# Patient Record
Sex: Female | Born: 1940 | Hispanic: Yes | Marital: Married | Smoking: Former smoker
Health system: Southern US, Community
[De-identification: ages and names within clinical notes are randomized; demographics above are authoritative.]

## PROBLEM LIST (undated history)

## (undated) DIAGNOSIS — K5909 Other constipation: Secondary | ICD-10-CM

## (undated) DIAGNOSIS — F32A Depression, unspecified: Secondary | ICD-10-CM

## (undated) DIAGNOSIS — E039 Hypothyroidism, unspecified: Secondary | ICD-10-CM

## (undated) DIAGNOSIS — N368 Other specified disorders of urethra: Secondary | ICD-10-CM

## (undated) DIAGNOSIS — M1990 Unspecified osteoarthritis, unspecified site: Secondary | ICD-10-CM

## (undated) DIAGNOSIS — E785 Hyperlipidemia, unspecified: Secondary | ICD-10-CM

## (undated) DIAGNOSIS — E079 Disorder of thyroid, unspecified: Secondary | ICD-10-CM

## (undated) DIAGNOSIS — F329 Major depressive disorder, single episode, unspecified: Secondary | ICD-10-CM

## (undated) DIAGNOSIS — M810 Age-related osteoporosis without current pathological fracture: Secondary | ICD-10-CM

## (undated) DIAGNOSIS — F419 Anxiety disorder, unspecified: Secondary | ICD-10-CM

## (undated) DIAGNOSIS — J189 Pneumonia, unspecified organism: Secondary | ICD-10-CM

## (undated) DIAGNOSIS — M316 Other giant cell arteritis: Secondary | ICD-10-CM

## (undated) HISTORY — PX: CARPAL TUNNEL RELEASE: SHX101

## (undated) HISTORY — PX: ABDOMINAL HYSTERECTOMY: SHX81

## (undated) HISTORY — DX: Anxiety disorder, unspecified: F41.9

## (undated) HISTORY — DX: Age-related osteoporosis without current pathological fracture: M81.0

## (undated) HISTORY — DX: Unspecified osteoarthritis, unspecified site: M19.90

## (undated) HISTORY — DX: Other specified disorders of urethra: N36.8

## (undated) HISTORY — DX: Other giant cell arteritis: M31.6

## (undated) HISTORY — DX: Hyperlipidemia, unspecified: E78.5

## (undated) HISTORY — DX: Depression, unspecified: F32.A

## (undated) HISTORY — PX: TONSILLECTOMY: SUR1361

## (undated) HISTORY — PX: BREAST SURGERY: SHX581

## (undated) HISTORY — DX: Disorder of thyroid, unspecified: E07.9

## (undated) HISTORY — DX: Major depressive disorder, single episode, unspecified: F32.9

## (undated) HISTORY — PX: BREAST EXCISIONAL BIOPSY: SUR124

## (undated) HISTORY — PX: COLONOSCOPY: SHX174

## (undated) HISTORY — PX: JOINT REPLACEMENT: SHX530

---

## 2012-10-09 HISTORY — PX: TEMPORAL ARTERY BIOPSY / LIGATION: SUR132

## 2016-07-21 DIAGNOSIS — M25512 Pain in left shoulder: Secondary | ICD-10-CM | POA: Diagnosis not present

## 2016-07-21 DIAGNOSIS — M25511 Pain in right shoulder: Secondary | ICD-10-CM | POA: Diagnosis not present

## 2016-07-21 DIAGNOSIS — Z7952 Long term (current) use of systemic steroids: Secondary | ICD-10-CM | POA: Diagnosis not present

## 2016-07-21 DIAGNOSIS — R5383 Other fatigue: Secondary | ICD-10-CM | POA: Diagnosis not present

## 2016-07-21 DIAGNOSIS — G8929 Other chronic pain: Secondary | ICD-10-CM | POA: Diagnosis not present

## 2016-07-21 DIAGNOSIS — M316 Other giant cell arteritis: Secondary | ICD-10-CM | POA: Diagnosis not present

## 2016-07-26 DIAGNOSIS — M67912 Unspecified disorder of synovium and tendon, left shoulder: Secondary | ICD-10-CM | POA: Diagnosis not present

## 2016-07-27 ENCOUNTER — Encounter: Payer: Self-pay | Admitting: Family Medicine

## 2016-07-27 ENCOUNTER — Ambulatory Visit (INDEPENDENT_AMBULATORY_CARE_PROVIDER_SITE_OTHER): Payer: Medicare Other | Admitting: Family Medicine

## 2016-07-27 VITALS — BP 110/68 | HR 80 | Temp 98.3°F | Resp 16 | Ht 62.25 in | Wt 136.1 lb

## 2016-07-27 DIAGNOSIS — E785 Hyperlipidemia, unspecified: Secondary | ICD-10-CM | POA: Insufficient documentation

## 2016-07-27 DIAGNOSIS — F331 Major depressive disorder, recurrent, moderate: Secondary | ICD-10-CM | POA: Diagnosis not present

## 2016-07-27 DIAGNOSIS — R0600 Dyspnea, unspecified: Secondary | ICD-10-CM | POA: Diagnosis not present

## 2016-07-27 DIAGNOSIS — E039 Hypothyroidism, unspecified: Secondary | ICD-10-CM | POA: Diagnosis not present

## 2016-07-27 DIAGNOSIS — G4700 Insomnia, unspecified: Secondary | ICD-10-CM

## 2016-07-27 LAB — TSH: TSH: 0.43 u[IU]/mL (ref 0.35–4.50)

## 2016-07-27 LAB — BRAIN NATRIURETIC PEPTIDE: Pro B Natriuretic peptide (BNP): 39 pg/mL (ref 0.0–100.0)

## 2016-07-27 MED ORDER — ATORVASTATIN CALCIUM 10 MG PO TABS: 10.0000 mg | ORAL_TABLET | Freq: Every day | ORAL | 2 refills | Status: DC

## 2016-07-27 MED ORDER — TEMAZEPAM 15 MG PO CAPS: 15.0000 mg | ORAL_CAPSULE | Freq: Every evening | ORAL | 1 refills | Status: DC | PRN

## 2016-07-27 MED ORDER — SERTRALINE HCL 100 MG PO TABS: 200.0000 mg | ORAL_TABLET | Freq: Every day | ORAL | 2 refills | Status: DC

## 2016-07-27 MED ORDER — LEVOTHYROXINE SODIUM 112 MCG PO TABS: 112.0000 ug | ORAL_TABLET | Freq: Every day | ORAL | 1 refills | Status: DC

## 2016-07-27 NOTE — Progress Notes (Signed)
Pre visit review using our clinic review tool, if applicable. No additional management support is needed unless otherwise documented below in the visit note. 

## 2016-07-27 NOTE — Patient Instructions (Addendum)
A few things to remember from today's visit:   Dyspnea, unspecified type - Plan: B Nat Peptide, DG Chest 2 View, EKG 12-Lead  Hypothyroidism, unspecified type - Plan: TSH  Major depressive disorder, recurrent episode, moderate (HCC)  Hyperlipidemia, unspecified hyperlipidemia type  A few tips:  -As we age balance is not as good as it was, so there is a higher risks for falls. Please remove small rugs and furniture that is "in your way" and could increase the risk of falls. Stretching exercises may help with fall prevention: Yoga and Tai Chi are some examples. Low impact exercise is better, so you are not very achy the next day.  Caution with dehydration, if working outdoors be sure to drink enough fluids.  - Some medications are not safe as we age, increases the risk of side effects and can potentially interact with other medication you are also taken;  including some of over the counter medications. Be sure to let me know when you start a new medication even if it is a dietary/vitamin supplement.   -Healthy diet low in red meet/animal fat and sugar + regular physical activity is recommended.      Please be sure medication list is accurate. If a new problem present, please set up appointment sooner than planned today.

## 2016-07-27 NOTE — Progress Notes (Signed)
HPI:   [name] is a 75 y.o. female, who is here today with her husband to establish care with me.  Former PCP in [place] Last preventive routine visit: 2017  Concerns today: SOB, attributed to Prednisone.   She has Hx of temporal arteritis and currently she is on Prednisone 50 mg daily, resumed while she was still in [place] and continued by rheumatologists she has seen here in this area a few weeks ago. She has been on high doses of Prednisone intermittently for years, started again in 05/2015 because severe temporal headache attributed to temporal arteritis. According to pt, headache becomes worse every time she tries to decrease dose of Prednisone. She had labs recently at her rheumatologists' office.  She denies chest pain, cough,or wheezing. No diaphoresis or dizziness (Hx of vertigo). Dyspnea is at rest and noted initially when she resumed Prednisone in 05/2016, she was on 60 mg at that time. She denies edema,orthopnea, or PND.  Hx of OA, mainly shoulders and hands, also back and knees. Denies Hx of PMR. She is following with orthopedists and recently she received a subacromial steroid injection. Pain is exacerbated by movement, improved some after injection.   Osteoporosis, she is on Fosamax, which she has taken intermittently and resumed about 1 year ago, last DEXA showed osteopenia.  Depression and anxiety, she is on Sertraline 200 mg daily, which has helped. She denies Hx of bipolar disorder or suicidal thoughts. + Insomnia, she is on Temazepam 15 mg, which she takes 2-3 times per week. She has had Xanax in the past, she had some tabs left and occasionally she takes 1/2 during the day.   Hypothyroidism: Currently she is on Levothyroxine 112 mcg daily. Denies cold/heat intolerance, changes in bowel habits, or abnormal wt loss.  Also Hx of HLD. She is on Lipitor 10 mg daily. Denies Hx of CVD. She thinks FLP was done 05/2016.  She follows a  healthful diet and currently she is not exercising regularly.    Review of Systems  Constitutional: Positive for fatigue. Negative for appetite change, fever and unexpected weight change.  HENT: Negative for mouth sores, nosebleeds and trouble swallowing.   Eyes: Negative for pain and visual disturbance.  Respiratory: Positive for shortness of breath. Negative for cough and wheezing.   Cardiovascular: Negative for chest pain, palpitations and leg swelling.  Gastrointestinal: Negative for abdominal pain, nausea and vomiting.       Negative for changes in bowel habits.  Genitourinary: Negative for decreased urine volume, difficulty urinating, dysuria and hematuria.  Musculoskeletal: Positive for arthralgias and back pain. Negative for gait problem.  Skin: Negative for color change and rash.  Neurological: Positive for headaches. Negative for seizures, syncope, weakness and numbness.  Psychiatric/Behavioral: Positive for sleep disturbance. Negative for confusion. The patient is nervous/anxious.       No current outpatient prescriptions on file prior to visit.   No current facility-administered medications on file prior to visit.      Past Medical History:  Diagnosis Date  . Anxiety   . Arthritis   . Depression   . Hyperlipidemia   . Osteoporosis   . Temporal arteritis ([place])   . Thyroid disease    Allergies  Allergen Reactions  . Tdap [Diphth-Acell Pertussis-Tetanus]     Family History  Problem Relation Age of Onset  . Heart disease Mother   . Kidney disease Mother   . Heart disease Father   . Kidney disease  Father     Social History   Social History  . Marital status: Married    Spouse name: N/A  . Number of children: N/A  . Years of education: N/A   Social History Main Topics  . Smoking status: Never Smoker  . Smokeless tobacco: Never Used  . Alcohol use No  . Drug use: No  . Sexual activity: Not Asked   Other Topics Concern  . None   Social History  Narrative  . None    Vitals:   07/27/16 1301  BP: 110/68  Pulse: 80  Resp: 16  Temp: 98.3 F (36.8 C)   O2 sat 98% at RA.  Body mass index is 24.7 kg/m.     Physical Exam  Nursing note and vitals reviewed. Constitutional: She is oriented to person, place, and time. She appears well-developed and well-nourished. She does not appear ill. No distress.  HENT:  Head: Atraumatic.  Mouth/Throat: Oropharynx is clear and moist and mucous membranes are normal.  Eyes: Conjunctivae and EOM are normal. Pupils are equal, round, and reactive to light.  Neck: No JVD present. No tracheal deviation present. No thyroid mass and no thyromegaly present.  Cardiovascular: Normal rate and regular rhythm.   No murmur heard. Pulses:      Dorsalis pedis pulses are 2+ on the right side, and 2+ on the left side.  Respiratory: Effort normal and breath sounds normal. No respiratory distress.  GI: Soft. She exhibits no mass. There is no hepatomegaly. There is no tenderness.  Musculoskeletal: She exhibits no edema.  Shoulder R>L with pain upon ROM, mild limitation of abduction. No signs of synovitis or major deformity appreciated.   Neurological: She is alert and oriented to person, place, and time. She has normal strength. She displays tremor (fine hand tremor, mild, with intention). No cranial nerve deficit. Coordination and gait normal.  Skin: Skin is warm. No rash noted. No erythema.  Psychiatric: Her speech is normal. Her mood appears anxious. Cognition and memory are normal.  Well groomed, good eye contact.      ASSESSMENT AND PLAN:     Jennifer Hull was seen today for establish care.  Diagnoses and all orders for this visit:  Lab Results  Component Value Date   TSH 0.43 07/27/2016     Dyspnea, unspecified type  We discussed possible causes. EKG today: SR, short PR interval,normal axis, ? IVCD, unspecific T wave abnormalities anterior leads, no other EKG available for comparison.  She  had labs done recently (CMP and CBC among some). We discussed some side effects of Prednisone, in this case benefit is greater than risk. Examination today does not suggest infectious process, still CXR recommended.  Instructed about warning signs.  F/U in 3-4 weeks.  -     B Nat Peptide; Future -     DG Chest 2 View; Future -     EKG 12-Lead -     B Nat Peptide  Hypothyroidism, unspecified type  No changes in current management, will follow labs done today and will give further recommendations accordingly.  -     levothyroxine (SYNTHROID, LEVOTHROID) 112 MCG tablet; Take 1 tablet (112 mcg total) by mouth daily before breakfast. -     TSH  Major depressive disorder, recurrent episode, moderate (HCC)  Stable. She is on a high dose Sertraline, no changes in current management for now.  -     sertraline (ZOLOFT) 100 MG tablet; Take 2 tablets (200 mg total) by mouth daily.  Hyperlipidemia, unspecified hyperlipidemia type  No changes in current management. Continue low fat diet. F/U 05/2017. Will try to obtain labs done by former PCP.  -     atorvastatin (LIPITOR) 10 MG tablet; Take 1 tablet (10 mg total) by mouth daily.   Insomnia, unspecified type  Some side effects of medication discussed. Good sleep hygiene discussed. No changes in current management. Fall preventions discussed.   -     temazepam (RESTORIL) 15 MG capsule; Take 1 capsule (15 mg total) by mouth at bedtime as needed for sleep.           Laretta Pyatt G. Martinique, MD  [facility]. [place] office.

## 2016-07-30 ENCOUNTER — Encounter: Payer: Self-pay | Admitting: Family Medicine

## 2016-07-31 ENCOUNTER — Ambulatory Visit (INDEPENDENT_AMBULATORY_CARE_PROVIDER_SITE_OTHER)
Admission: RE | Admit: 2016-07-31 | Discharge: 2016-07-31 | Disposition: A | Discharge status: Home / Self Care | Payer: Medicare Other | Source: Ambulatory Visit | Attending: Family Medicine | Admitting: Family Medicine

## 2016-07-31 DIAGNOSIS — R0602 Shortness of breath: Secondary | ICD-10-CM | POA: Diagnosis not present

## 2016-07-31 DIAGNOSIS — R0600 Dyspnea, unspecified: Secondary | ICD-10-CM | POA: Diagnosis not present

## 2016-08-10 DIAGNOSIS — M316 Other giant cell arteritis: Secondary | ICD-10-CM | POA: Diagnosis not present

## 2016-08-10 DIAGNOSIS — R5383 Other fatigue: Secondary | ICD-10-CM | POA: Diagnosis not present

## 2016-08-10 DIAGNOSIS — Z7952 Long term (current) use of systemic steroids: Secondary | ICD-10-CM | POA: Diagnosis not present

## 2016-08-10 DIAGNOSIS — G8929 Other chronic pain: Secondary | ICD-10-CM | POA: Diagnosis not present

## 2016-08-10 DIAGNOSIS — M25512 Pain in left shoulder: Secondary | ICD-10-CM | POA: Diagnosis not present

## 2016-08-10 DIAGNOSIS — M25511 Pain in right shoulder: Secondary | ICD-10-CM | POA: Diagnosis not present

## 2016-08-10 DIAGNOSIS — R768 Other specified abnormal immunological findings in serum: Secondary | ICD-10-CM | POA: Diagnosis not present

## 2016-08-11 DIAGNOSIS — M67912 Unspecified disorder of synovium and tendon, left shoulder: Secondary | ICD-10-CM | POA: Diagnosis not present

## 2016-08-15 DIAGNOSIS — M25512 Pain in left shoulder: Secondary | ICD-10-CM | POA: Diagnosis not present

## 2016-08-22 DIAGNOSIS — M25512 Pain in left shoulder: Secondary | ICD-10-CM | POA: Diagnosis not present

## 2016-08-24 DIAGNOSIS — M25512 Pain in left shoulder: Secondary | ICD-10-CM | POA: Diagnosis not present

## 2016-08-25 DIAGNOSIS — M75112 Incomplete rotator cuff tear or rupture of left shoulder, not specified as traumatic: Secondary | ICD-10-CM | POA: Diagnosis not present

## 2016-08-28 ENCOUNTER — Ambulatory Visit (INDEPENDENT_AMBULATORY_CARE_PROVIDER_SITE_OTHER): Payer: Medicare Other | Admitting: Family Medicine

## 2016-08-28 ENCOUNTER — Encounter: Payer: Self-pay | Admitting: Family Medicine

## 2016-08-28 VITALS — BP 114/62 | HR 80 | Resp 12 | Ht 62.25 in | Wt 139.1 lb

## 2016-08-28 DIAGNOSIS — E039 Hypothyroidism, unspecified: Secondary | ICD-10-CM | POA: Diagnosis not present

## 2016-08-28 DIAGNOSIS — R69 Illness, unspecified: Secondary | ICD-10-CM | POA: Diagnosis not present

## 2016-08-28 DIAGNOSIS — R739 Hyperglycemia, unspecified: Secondary | ICD-10-CM | POA: Diagnosis not present

## 2016-08-28 DIAGNOSIS — F411 Generalized anxiety disorder: Secondary | ICD-10-CM

## 2016-08-28 LAB — POCT GLYCOSYLATED HEMOGLOBIN (HGB A1C): Hemoglobin A1C: 5.8

## 2016-08-28 NOTE — Patient Instructions (Addendum)
A few things to remember from today's visit:   Hyperglycemia - Plan: POCT HgB A1C  Hypothyroidism, unspecified type - Plan: TSH  Generalized anxiety disorder  No changes today. Continue following with rheumatologists.  In 2 weeks TSH.  A1C today 5.8.   Please be sure medication list is accurate. If a new problem present, please set up appointment sooner than planned today.

## 2016-08-28 NOTE — Progress Notes (Signed)
HPI:   [name] Hull is a 75 y.o. female, who is here today to follow on dyspnea and some other health problems addressed last OV.   I saw her last time on 07/27/16, she was c/o dyspnea, which she was attributing to Prednisone. Hx of temporal arteritis, since her last OV she has seen rheumatologists and Prednisone was decreased from 50 mg to 30 mg and planning on decreasing to 20 mg this weekend. Headache now mild and not as frequent as she was having it, greatly improved.  Recently Dx with RA, she has discussed treatment options and planning on starting Methotrexate.  -She also follows with ortho, left shoulder pain is now mild after shoulder injection.  Recent left shoulder MRI (08/22/16) showed severe tendinosis of the supraspinatus tendon with a high-grade partial-thickness articular  surface tear. Moderate tendinosis of the infraspinatus tendon with a small partial-thickness articular sore face tear. Severe osteoarthritis of the right glenohumeral joint.  -Episodes of chest tightness and shaking sensation, intermittent, has had 2 episodes since her last OV, usually at rest, alleviated by Xanax. She has not identified exacerbating factors, she believes it is also related to Prednisone.  Blurry vision, she  Would like A1C checked, according to pt, former PCP was checking it q 3-4 months because Prednisone treatment. She denies conjunctival erythema or eye pain.  All these symptoms have improved with decreasing Prednisone dose.    Depression and anxiety: Zoloft 150 mg daily. Denies insomnia or suicidal thoughts. Takes Temazepam 15 mg, seems like she needs it less frequent since Prednisone dose was decreased, 2 times per week. Last OV Xanax was discontinued. Blurry vision, dizziness all attributed to Prednisone.  Hypothyroidism: Last OV TSH was on lower normal range. Dose of Levothyroxine was decreased to 112 x 6 days and 1/2 tab x 1 day.  Lab Results  Component Value  Date   TSH 0.43 07/27/2016    No new concerns today.     Review of Systems  Constitutional: Negative for appetite change, diaphoresis, fatigue, fever and unexpected weight change.  HENT: Negative for mouth sores, nosebleeds and trouble swallowing.   Eyes: Positive for visual disturbance (blurry vision). Negative for photophobia, pain and redness.  Respiratory: Positive for chest tightness and shortness of breath. Negative for cough and wheezing.   Cardiovascular: Negative for chest pain, palpitations and leg swelling.  Gastrointestinal: Negative for abdominal pain, nausea and vomiting.       Negative for changes in bowel habits.  Endocrine: Negative for polydipsia, polyphagia and polyuria.  Genitourinary: Negative for decreased urine volume, difficulty urinating, dysuria and hematuria.  Musculoskeletal: Negative for gait problem and neck pain.  Skin: Negative for rash.  Neurological: Negative for syncope, weakness and numbness.  Psychiatric/Behavioral: Negative for confusion, sleep disturbance and suicidal ideas. The patient is nervous/anxious.       Current Outpatient Prescriptions on File Prior to Visit  Medication Sig Dispense Refill  . Acetaminophen (TYLENOL ARTHRITIS PAIN PO) Take 2 tablets by mouth 3 (three) times daily.    Marland Kitchen alendronate (FOSAMAX) 70 MG tablet Take 70 mg by mouth once a week.     Marland Kitchen atorvastatin (LIPITOR) 10 MG tablet Take 1 tablet (10 mg total) by mouth daily. 90 tablet 2  . Cholecalciferol (VITAMIN D3) 2000 units TABS Take 1 tablet by mouth daily.    Marland Kitchen levothyroxine (SYNTHROID, LEVOTHROID) 112 MCG tablet Take 1 tablet (112 mcg total) by mouth daily before breakfast. (Patient taking differently: Take 112 mcg by  mouth daily before breakfast. Take 1/2 tablet on Sundays.) 90 tablet 1  . predniSONE (DELTASONE) 50 MG tablet Take 30 mg by mouth daily with breakfast.     . temazepam (RESTORIL) 15 MG capsule Take 1 capsule (15 mg total) by mouth at bedtime as needed  for sleep. 30 capsule 1   No current facility-administered medications on file prior to visit.      Past Medical History:  Diagnosis Date  . Anxiety   . Arthritis   . Depression   . Hyperlipidemia   . Osteoporosis   . Temporal arteritis ([place])   . Thyroid disease    Allergies  Allergen Reactions  . Tdap [Diphth-Acell Pertussis-Tetanus]     Social History   Social History  . Marital status: Married    Spouse name: N/A  . Number of children: N/A  . Years of education: N/A   Social History Main Topics  . Smoking status: Never Smoker  . Smokeless tobacco: Never Used  . Alcohol use No  . Drug use: No  . Sexual activity: Not Asked   Other Topics Concern  . None   Social History Narrative  . None    Vitals:   08/28/16 1345  BP: 114/62  Pulse: 80  Resp: 12   Body mass index is 25.23 kg/m.    Physical Exam  Nursing note and vitals reviewed. Constitutional: She is oriented to person, place, and time. She appears well-developed and well-nourished. She does not appear ill. No distress.  HENT:  Head: Atraumatic.  Mouth/Throat: Oropharynx is clear and moist and mucous membranes are normal.  Eyes: Conjunctivae and EOM are normal. Pupils are equal, round, and reactive to light.  Cardiovascular: Normal rate and regular rhythm.   No murmur heard. Pulses:      Dorsalis pedis pulses are 2+ on the right side, and 2+ on the left side.  Respiratory: Effort normal and breath sounds normal. No respiratory distress.  GI: Soft. She exhibits no mass. There is no hepatomegaly. There is no tenderness.  Musculoskeletal: She exhibits no edema or tenderness.  Neurological: She is alert and oriented to person, place, and time. She has normal strength. She displays no tremor. No cranial nerve deficit. Coordination normal.  Stable gait with no assistance needed.  Skin: Skin is warm. No erythema.  Psychiatric: Her mood appears anxious. Cognition and memory are normal.  Well groomed,  good eye contact.      ASSESSMENT AND PLAN:     [name] was seen today for follow-up.  Diagnoses and all orders for this visit:    Hyperglycemia  A1C today 5.8. Most of her complaints seem to be associated to Prednisone and improving as Prednisone dose is being decreased; tremor noted last OV also has resolved. She will continue weaning dose as recommended by rheuma, keep next appt. Clearly instructed about warning signs.  -     POCT HgB A1C  Hypothyroidism, unspecified type  No changes in current management, will check TSH in 2 weeks and will give further recommendations accordingly.   -     TSH; Future  Generalized anxiety disorder  Prednisone could also aggravate problem. No changes in Zoloft for now. Avoid Xanax sine she is on Temazepam.  If she feels like she does not need medication for sleep, we could stop Temazepam and continue Xanax or consider Clonazepam.  For now we will monitor symptoms as she continues decreasing dose of Prednisone. F/U in 4 months, before if needed.       -  [name] Hull was advised to return sooner than planned today if new concerns arise.       Junior Kenedy G. Martin[place], MD  [facility]. [place] office.

## 2016-09-12 ENCOUNTER — Other Ambulatory Visit (INDEPENDENT_AMBULATORY_CARE_PROVIDER_SITE_OTHER): Payer: Medicare Other

## 2016-09-12 DIAGNOSIS — E039 Hypothyroidism, unspecified: Secondary | ICD-10-CM | POA: Diagnosis not present

## 2016-09-12 LAB — TSH: TSH: 0.58 u[IU]/mL (ref 0.35–4.50)

## 2016-09-19 ENCOUNTER — Telehealth: Payer: Self-pay | Admitting: Family Medicine

## 2016-09-19 NOTE — Telephone Encounter (Signed)
Called and spoke with patient. Went over the lab results again the change in directions. Patient verbalized understanding.

## 2016-09-19 NOTE — Telephone Encounter (Signed)
Patient is requesting a call back.  Contact Info: [phone]

## 2016-09-20 DIAGNOSIS — G629 Polyneuropathy, unspecified: Secondary | ICD-10-CM | POA: Diagnosis not present

## 2016-09-20 DIAGNOSIS — Z7952 Long term (current) use of systemic steroids: Secondary | ICD-10-CM | POA: Diagnosis not present

## 2016-09-20 DIAGNOSIS — R739 Hyperglycemia, unspecified: Secondary | ICD-10-CM | POA: Diagnosis not present

## 2016-09-20 DIAGNOSIS — G8929 Other chronic pain: Secondary | ICD-10-CM | POA: Diagnosis not present

## 2016-09-20 DIAGNOSIS — M316 Other giant cell arteritis: Secondary | ICD-10-CM | POA: Diagnosis not present

## 2016-09-20 DIAGNOSIS — Z6823 Body mass index (BMI) 23.0-23.9, adult: Secondary | ICD-10-CM | POA: Diagnosis not present

## 2016-09-20 DIAGNOSIS — L299 Pruritus, unspecified: Secondary | ICD-10-CM | POA: Diagnosis not present

## 2016-09-20 DIAGNOSIS — R202 Paresthesia of skin: Secondary | ICD-10-CM | POA: Diagnosis not present

## 2016-09-20 DIAGNOSIS — M8589 Other specified disorders of bone density and structure, multiple sites: Secondary | ICD-10-CM | POA: Diagnosis not present

## 2016-09-20 DIAGNOSIS — R5383 Other fatigue: Secondary | ICD-10-CM | POA: Diagnosis not present

## 2016-09-20 DIAGNOSIS — R768 Other specified abnormal immunological findings in serum: Secondary | ICD-10-CM | POA: Diagnosis not present

## 2016-09-29 DIAGNOSIS — H04123 Dry eye syndrome of bilateral lacrimal glands: Secondary | ICD-10-CM | POA: Diagnosis not present

## 2016-09-29 DIAGNOSIS — H26493 Other secondary cataract, bilateral: Secondary | ICD-10-CM | POA: Diagnosis not present

## 2016-09-29 DIAGNOSIS — M0570 Rheumatoid arthritis with rheumatoid factor of unspecified site without organ or systems involvement: Secondary | ICD-10-CM | POA: Diagnosis not present

## 2016-09-29 DIAGNOSIS — H524 Presbyopia: Secondary | ICD-10-CM | POA: Diagnosis not present

## 2016-09-29 DIAGNOSIS — H5713 Ocular pain, bilateral: Secondary | ICD-10-CM | POA: Diagnosis not present

## 2016-11-07 DIAGNOSIS — H6123 Impacted cerumen, bilateral: Secondary | ICD-10-CM | POA: Diagnosis not present

## 2016-11-07 DIAGNOSIS — H9313 Tinnitus, bilateral: Secondary | ICD-10-CM | POA: Diagnosis not present

## 2016-11-07 DIAGNOSIS — J343 Hypertrophy of nasal turbinates: Secondary | ICD-10-CM | POA: Diagnosis not present

## 2016-11-07 DIAGNOSIS — H900 Conductive hearing loss, bilateral: Secondary | ICD-10-CM | POA: Diagnosis not present

## 2016-11-14 DIAGNOSIS — M5136 Other intervertebral disc degeneration, lumbar region: Secondary | ICD-10-CM | POA: Diagnosis not present

## 2016-11-14 DIAGNOSIS — G629 Polyneuropathy, unspecified: Secondary | ICD-10-CM | POA: Diagnosis not present

## 2016-11-14 DIAGNOSIS — Z6824 Body mass index (BMI) 24.0-24.9, adult: Secondary | ICD-10-CM | POA: Diagnosis not present

## 2016-11-14 DIAGNOSIS — R768 Other specified abnormal immunological findings in serum: Secondary | ICD-10-CM | POA: Diagnosis not present

## 2016-11-14 DIAGNOSIS — Z7952 Long term (current) use of systemic steroids: Secondary | ICD-10-CM | POA: Diagnosis not present

## 2016-11-14 DIAGNOSIS — G8929 Other chronic pain: Secondary | ICD-10-CM | POA: Diagnosis not present

## 2016-11-14 DIAGNOSIS — L299 Pruritus, unspecified: Secondary | ICD-10-CM | POA: Diagnosis not present

## 2016-11-14 DIAGNOSIS — M316 Other giant cell arteritis: Secondary | ICD-10-CM | POA: Diagnosis not present

## 2016-11-15 DIAGNOSIS — M316 Other giant cell arteritis: Secondary | ICD-10-CM | POA: Diagnosis not present

## 2016-11-15 DIAGNOSIS — Z7952 Long term (current) use of systemic steroids: Secondary | ICD-10-CM | POA: Diagnosis not present

## 2016-11-24 ENCOUNTER — Other Ambulatory Visit: Payer: Self-pay | Admitting: Family Medicine

## 2016-11-24 DIAGNOSIS — E039 Hypothyroidism, unspecified: Secondary | ICD-10-CM

## 2016-12-01 DIAGNOSIS — M533 Sacrococcygeal disorders, not elsewhere classified: Secondary | ICD-10-CM | POA: Diagnosis not present

## 2016-12-07 DIAGNOSIS — M461 Sacroiliitis, not elsewhere classified: Secondary | ICD-10-CM | POA: Diagnosis not present

## 2016-12-07 DIAGNOSIS — M533 Sacrococcygeal disorders, not elsewhere classified: Secondary | ICD-10-CM | POA: Diagnosis not present

## 2016-12-20 DIAGNOSIS — M533 Sacrococcygeal disorders, not elsewhere classified: Secondary | ICD-10-CM | POA: Diagnosis not present

## 2016-12-25 NOTE — Progress Notes (Signed)
HPI:   Jennifer Hull is a 76 y.o. female, who is here today to follow on some chronic medical problems.  Last seen on 08/28/16. Since her last OV,she has followed with ophthalmologists,rheuma,and ENT. She follows with rheuma for temporal arteritis. Next appt 01/2016.   Anxiety and depression, she is on Zoloft 150 mg daily. She denies suicidal thoughts. Sleeping better since Prednisone dose has been tapered down.  Anxiety,she takes Temazepam 15 mg as needed (3-4 times per week). Tolerating medications well,denies side effects.  Hypothyroidism: Currently she is on Levothyroxine 1 tab daily x 5 days and 1/2 tab x 2 days.Dose was decreased last OV.  Tolerating medication well, no side effects reported. She has not noted dysphagia, palpitations, abdominal pain, changes in bowel habits, tremor, cold/heat intolerance, or abnormal weight loss.  Lab Results  Component Value Date   TSH 0.58 09/12/2016    Concerns today:   -Palpebral edema: Ophthalmologist evaluation in 09/2016, she was Dx with dry eye, recently started Restasis. She has had this problem for years,she initially attributed to temporal arteritis but now that symptoms have resolved she still has edema and erythema at the edge of upper and lower eye lid, getting worse. She has no conjunctival erythema,changes in vision,or purulent discharge.  Recently she had perioral ,pruritic rash.According to pt,OTC Hydrocortisone was recommended,she has noted improvement.   -A month of dry cough, dyspnea at rest and worse with exertion, in general stable. We discussed this problem when we first met,07/2016, Initially this was attributed to Prednisone. Currently she is on Prednisone 6 mg, has been tapered down from 60 mg. According to pt,husband has voiced concerned about her breathing when she is engaged in mild to moderate physical activity but also when talking for long time (minutes).She denies associated chest  pain,diaphoresis,palpitations,or wheezing. EKG was done in 07/2016: Unspecific T wave abnormalities,? IVCD. No other EKG was available for comparison.  VWP:[id number] otherwise. BNP in normal range.  HLD: She is on Lipitor 10 mg. Following low fat diet. Tolerating medication well,no side effects reported.   +Gas"", upper abdominal burning pain, no changes in diet. She has not identified exacerbating factors. She was on Omeprazole in the past.She takes Ranitidine OTC as needed.   Denies nausea, vomiting, changes in bowel habits, blood in stool or melena. She has tried OTC X gas, does not help much.    Review of Systems  Constitutional: Positive for fatigue. Negative for appetite change, diaphoresis, fever and unexpected weight change.  HENT: Negative for facial swelling, mouth sores, nosebleeds, sore throat and trouble swallowing.   Eyes: Positive for itching. Negative for photophobia, pain, discharge, redness and visual disturbance.  Respiratory: Positive for cough (Dry) and shortness of breath. Negative for wheezing.   Cardiovascular: Negative for chest pain, palpitations and leg swelling.  Gastrointestinal: Positive for abdominal pain. Negative for blood in stool, nausea and vomiting.       Negative for changes in bowel habits.  Endocrine: Negative for cold intolerance and heat intolerance.  Genitourinary: Negative for decreased urine volume and hematuria.  Musculoskeletal: Positive for arthralgias. Negative for gait problem and neck pain.  Skin: Negative for pallor and wound.  Neurological: Negative for syncope, weakness and headaches.  Psychiatric/Behavioral: Negative for confusion and suicidal ideas. The patient is nervous/anxious.       Current Outpatient Prescriptions on File Prior to Visit  Medication Sig Dispense Refill  . Acetaminophen (TYLENOL ARTHRITIS PAIN PO) Take 2 tablets by mouth 3 (three) times daily.    Marland Kitchen  alendronate (FOSAMAX) 70 MG tablet Take 70 mg by  mouth once a week.     Marland Kitchen atorvastatin (LIPITOR) 10 MG tablet Take 1 tablet (10 mg total) by mouth daily. 90 tablet 2  . Cholecalciferol (VITAMIN D3) 2000 units TABS Take 1 tablet by mouth daily.    Marland Kitchen levothyroxine (SYNTHROID, LEVOTHROID) 112 MCG tablet TAKE ONE TABLET BY MOUTH ONCE DAILY BEFORE BREAKFAST 90 tablet 0  . predniSONE (DELTASONE) 50 MG tablet Take 60 mg by mouth daily with breakfast.     . sertraline (ZOLOFT) 100 MG tablet Take 1 and 1/2 tablets by mouth daily.    . temazepam (RESTORIL) 15 MG capsule Take 1 capsule (15 mg total) by mouth at bedtime as needed for sleep. 30 capsule 1   No current facility-administered medications on file prior to visit.      Past Medical History:  Diagnosis Date  . Anxiety   . Arthritis   . Depression   . Hyperlipidemia   . Osteoporosis   . Temporal arteritis ([place])   . Thyroid disease    Allergies  Allergen Reactions  . Tdap [Diphth-Acell Pertussis-Tetanus]     Social History   Social History  . Marital status: Married    Spouse name: N/A  . Number of children: N/A  . Years of education: N/A   Social History Main Topics  . Smoking status: Never Smoker  . Smokeless tobacco: Never Used  . Alcohol use No  . Drug use: No  . Sexual activity: Not Asked   Other Topics Concern  . None   Social History Narrative  . None    Vitals:   12/26/16 1344  BP: 110/70  Pulse: 82  Resp: 16  O2 sat at RA 97%. Body mass index is 25.79 kg/m.  Physical Exam  Nursing note and vitals reviewed. Constitutional: She is oriented to person, place, and time. She appears well-developed and well-nourished. No distress.  HENT:  Head: Atraumatic.  Mouth/Throat: Oropharynx is clear and moist and mucous membranes are normal.  Eyes: Conjunctivae and EOM are normal. Pupils are equal, round, and reactive to light. Right eye exhibits no discharge. Left eye exhibits no discharge.  Erythema along edge of upper and lower eye lids with mild edema.    Neck: No JVD present. No tracheal deviation present. No thyroid mass and no thyromegaly present.  Cardiovascular: Normal rate and regular rhythm.   Murmur (? soft SEM LUSB,apex) heard. Pulses:      Dorsalis pedis pulses are 2+ on the right side, and 2+ on the left side.  Respiratory: Effort normal and breath sounds normal. No respiratory distress.  GI: Soft. Bowel sounds are normal. She exhibits no mass. There is no hepatomegaly. There is no tenderness.  Musculoskeletal: She exhibits no edema.  Lymphadenopathy:    She has no cervical adenopathy.  Neurological: She is alert and oriented to person, place, and time. She has normal strength. Coordination normal.  Skin: Skin is warm. No erythema.  Psychiatric: She has a normal mood and affect.  Well groomed, good eye contact.      ASSESSMENT AND PLAN:   Kharlie was seen today for follow-up.  Diagnoses and all orders for this visit:   Exertional dyspnea  Persistent despite the fact Prednisone has been decreased from 60 mg to 6 mg. ? Deconditioning.  No Hx of tobacco use. + HLD and Hx of rheumatologic disorder. Given the mild EKG abnormalities seen in 102017,I recommend cardiology evaluation. Instructed about warning signs.  -  Ambulatory referral to Cardiology -     Basic metabolic panel -     CBC  Blepharitis of upper and lower eyelids of both eyes, unspecified type  She could try baby Johnson shampoo daily.She wonders if steroid eye drops may help,side effects discussed and not recommended.  Minimal amount of OTC Hydrocortisone a few times per week may also help but needs to be cautious. Continue following with ophthalmologists.  Gastroesophageal reflux disease, esophagitis presence not specified  Thi could be the cause of cough and epigastric burning sensation. She agrees with trial of PPI. GERD precautions discussed. Instructed about warning signs.   -     omeprazole (PRILOSEC) 20 MG capsule; Take 1 capsule (20  mg total) by mouth daily.  Hypothyroidism, unspecified type  No changes in current management, future lab placed, will give further recommendations accordingly.  -     TSH  Generalized anxiety disorder  Stable. No changes in current management. F/U in 5 months.  Hyperlipidemia, unspecified hyperlipidemia type  Continue Lipitor 10 mg. No changes in current management, will follow lab and will give further recommendations accordingly. F/U in 6-12 months.  Major depressive disorder, recurrent episode, moderate (HCC)  Stable. No changes in current management. F/U in 5 months.   -Ms. Kimmberly Boger was advised to return sooner than planned today if new concerns arise.       Vesper Trant G. Martinique, MD  [facility] [place] office.

## 2016-12-26 ENCOUNTER — Encounter: Payer: Self-pay | Admitting: Family Medicine

## 2016-12-26 ENCOUNTER — Ambulatory Visit (INDEPENDENT_AMBULATORY_CARE_PROVIDER_SITE_OTHER): Payer: Medicare Other | Admitting: Family Medicine

## 2016-12-26 VITALS — BP 110/70 | HR 82 | Resp 16 | Ht 62.25 in | Wt 142.1 lb

## 2016-12-26 DIAGNOSIS — H01001 Unspecified blepharitis right upper eyelid: Secondary | ICD-10-CM | POA: Diagnosis not present

## 2016-12-26 DIAGNOSIS — E039 Hypothyroidism, unspecified: Secondary | ICD-10-CM | POA: Diagnosis not present

## 2016-12-26 DIAGNOSIS — K219 Gastro-esophageal reflux disease without esophagitis: Secondary | ICD-10-CM

## 2016-12-26 DIAGNOSIS — H01002 Unspecified blepharitis right lower eyelid: Secondary | ICD-10-CM | POA: Diagnosis not present

## 2016-12-26 DIAGNOSIS — H01004 Unspecified blepharitis left upper eyelid: Secondary | ICD-10-CM | POA: Diagnosis not present

## 2016-12-26 DIAGNOSIS — F411 Generalized anxiety disorder: Secondary | ICD-10-CM

## 2016-12-26 DIAGNOSIS — F331 Major depressive disorder, recurrent, moderate: Secondary | ICD-10-CM

## 2016-12-26 DIAGNOSIS — E785 Hyperlipidemia, unspecified: Secondary | ICD-10-CM

## 2016-12-26 DIAGNOSIS — H01005 Unspecified blepharitis left lower eyelid: Secondary | ICD-10-CM

## 2016-12-26 DIAGNOSIS — R0609 Other forms of dyspnea: Secondary | ICD-10-CM

## 2016-12-26 DIAGNOSIS — H0100B Unspecified blepharitis left eye, upper and lower eyelids: Secondary | ICD-10-CM

## 2016-12-26 DIAGNOSIS — H0100A Unspecified blepharitis right eye, upper and lower eyelids: Secondary | ICD-10-CM

## 2016-12-26 DIAGNOSIS — R0600 Dyspnea, unspecified: Secondary | ICD-10-CM

## 2016-12-26 MED ORDER — OMEPRAZOLE 20 MG PO CPDR: 20.0000 mg | DELAYED_RELEASE_CAPSULE | Freq: Every day | ORAL | 3 refills | Status: DC

## 2016-12-26 NOTE — Progress Notes (Signed)
Pre visit review using our clinic review tool, if applicable. No additional management support is needed unless otherwise documented below in the visit note. 

## 2016-12-26 NOTE — Patient Instructions (Signed)
A few things to remember from today's visit:   Hypothyroidism, unspecified type - Plan: TSH  Generalized anxiety disorder  Major depressive disorder, recurrent episode, moderate (HCC)  Exertional dyspnea - Plan: Ambulatory referral to Cardiology  Blepharitis of upper and lower eyelids of both eyes, unspecified type    Avoid foods that make your symptoms worse, for example coffee, chocolate,pepermeint,alcohol, and greasy food. Raising the head of your bed about 6 inches may help with nocturnal symptoms.   Weight loss (if you are overweight). Avoid lying down for 3 hours after eating.  Instead 3 large meals daily try small and more frequent meals during the day.  Every medication have side effects and medications for GERD are not the exception.At this time I think benefit is greater than risk.  There has been some concerns about dementia and medications like Omeprazole or Nexium (PPI) but recent studies do not show a relation. Also kidney function can be affected among some patients that take these type of medications, we will follow accordingly. Taking these medications for long term could increase risk of osteoporosis (some debate now), vitamin deficiencies (Vit D and B12 specialty), increases risk of pneumonia.  You should be evaluated immediately if bloody vomiting, bloody stools, black stools (like tar), difficulty swallowing, food gets stuck on the way down or choking when eating. Abnormal weight loss or severe abdominal pain.  If symptoms are not resolved sometimes endoscopy is necessary.  Please be sure medication list is accurate. If a new problem present, please set up appointment sooner than planned today.

## 2016-12-29 DIAGNOSIS — H5713 Ocular pain, bilateral: Secondary | ICD-10-CM | POA: Diagnosis not present

## 2016-12-29 DIAGNOSIS — H26493 Other secondary cataract, bilateral: Secondary | ICD-10-CM | POA: Diagnosis not present

## 2016-12-29 DIAGNOSIS — H01111 Allergic dermatitis of right upper eyelid: Secondary | ICD-10-CM | POA: Diagnosis not present

## 2016-12-29 DIAGNOSIS — M0570 Rheumatoid arthritis with rheumatoid factor of unspecified site without organ or systems involvement: Secondary | ICD-10-CM | POA: Diagnosis not present

## 2017-01-03 ENCOUNTER — Ambulatory Visit (INDEPENDENT_AMBULATORY_CARE_PROVIDER_SITE_OTHER): Payer: Medicare Other | Admitting: Cardiovascular Disease

## 2017-01-03 ENCOUNTER — Encounter: Payer: Self-pay | Admitting: Cardiovascular Disease

## 2017-01-03 VITALS — BP 114/60 | HR 67 | Ht 63.0 in | Wt 141.4 lb

## 2017-01-03 DIAGNOSIS — R0600 Dyspnea, unspecified: Secondary | ICD-10-CM

## 2017-01-03 DIAGNOSIS — R0609 Other forms of dyspnea: Secondary | ICD-10-CM

## 2017-01-03 NOTE — Progress Notes (Signed)
Cardiology Office Note   Date:  01/03/2017   ID:  Jennifer Hull, DOB 25-Jul-1941, MRN [id number]  PCP:  [name], MD  Cardiologist:   Mertie Moores, MD   Chief Complaint  Patient presents with  . Shortness of Breath   Problem List 1. Temporal Arteritis 2. Shortness of breath  3. Hyperlipidemia  4. Hypothyroidism     History of Present Illness: Jennifer Hull is a 76 y.o. female who presents for Evaluation of shortness of breath. Seen with husband, Jennifer Hull.  Originally from [place]  Has been having some DOE.   About a month or so .   Is on a prednisone taper ( for temporal arteritis)  Is gradually tapering down the dose, is now on 6 mg a day   Denies any chest pain or pressure  Goes to the gym several days a week.   Does the treadmill and does not have much in the way of shortness of breath   Former smoker Father died of an MI at age 22    Past Medical History:  Diagnosis Date  . Anxiety   . Arthritis   . Depression   . Hyperlipidemia   . Osteoporosis   . Temporal arteritis ([place])   . Thyroid disease     Past Surgical History:  Procedure Laterality Date  . ABDOMINAL HYSTERECTOMY    . BREAST SURGERY     biopsy  . TONSILLECTOMY       Current Outpatient Prescriptions  Medication Sig Dispense Refill  . Acetaminophen (TYLENOL ARTHRITIS PAIN PO) Take 2 tablets by mouth 3 (three) times daily.    Marland Kitchen alendronate (FOSAMAX) 70 MG tablet Take 70 mg by mouth once a week.     Marland Kitchen atorvastatin (LIPITOR) 10 MG tablet Take 1 tablet (10 mg total) by mouth daily. 90 tablet 2  . Cholecalciferol (VITAMIN D3) 2000 units TABS Take 1 tablet by mouth daily.    Marland Kitchen levothyroxine (SYNTHROID, LEVOTHROID) 112 MCG tablet TAKE ONE TABLET BY MOUTH ONCE DAILY BEFORE BREAKFAST 90 tablet 0  . omeprazole (PRILOSEC) 20 MG capsule Take 1 capsule (20 mg total) by mouth daily. 30 capsule 3  . predniSONE (DELTASONE) 50 MG tablet Take 6 mg by mouth daily with breakfast.     . sertraline (ZOLOFT)  100 MG tablet Take 1 and 1/2 tablets by mouth daily.    . temazepam (RESTORIL) 15 MG capsule Take 1 capsule (15 mg total) by mouth at bedtime as needed for sleep. 30 capsule 1   No current facility-administered medications for this visit.     No flowsheet data found.    Allergies:   Tdap [diphth-acell pertussis-tetanus]    Social History:  The patient  reports that she has never smoked. She has never used smokeless tobacco. She reports that she does not drink alcohol or use drugs.   Family History:  The patient's family history includes Heart disease in her father and mother; Kidney disease in her father and mother.    ROS:  Please see the history of present illness.    Review of Systems: Constitutional:  denies fever, chills, diaphoresis, appetite change and fatigue.  HEENT: denies photophobia, eye pain, redness, hearing loss, ear pain, congestion, sore throat, rhinorrhea, sneezing, neck pain, neck stiffness and tinnitus.  Respiratory: admits to SOB, DOE, cough,    Cardiovascular: denies chest pain, palpitations and leg swelling.  Gastrointestinal: denies nausea, vomiting, abdominal pain, diarrhea, constipation, blood in stool.  Genitourinary: denies dysuria, urgency, frequency, hematuria, flank  pain and difficulty urinating.  Musculoskeletal: denies  myalgias, back pain, joint swelling, arthralgias and gait problem.   Skin: denies pallor, rash and wound.  Neurological: denies dizziness, seizures, syncope, weakness, light-headedness, numbness and headaches.   Hematological: denies adenopathy, easy bruising, personal or family bleeding history.  Psychiatric/ Behavioral: denies suicidal ideation, mood changes, confusion, nervousness, sleep disturbance and agitation.       All other systems are reviewed and negative.    PHYSICAL EXAM: VS:  BP 114/60 (BP Location: Right Arm, Patient Position: Sitting, Cuff Size: Normal)   Pulse 67   Ht 5\' 3"  (1.6 m)   Wt 141 lb 6.4 oz (64.1  kg)   SpO2 97%   BMI 25.05 kg/m  , BMI Body mass index is 25.05 kg/m. GEN: Well nourished, well developed, in no acute distress  HEENT: normal  Neck: no JVD, carotid bruits, or masses Cardiac: RRR; no murmurs, rubs, or gallops,no edema  Respiratory:  clear to auscultation bilaterally, normal work of breathing GI: soft, nontender, nondistended, + BS MS: no deformity or atrophy  Skin: warm and dry, no rash Neuro:  Strength and sensation are intact Psych: normal   EKG:  EKG is not ordered today. The ekg ordered Oct. 19, 2017demonstrates NSR at 64   Recent Labs: 07/27/2016: Pro B Natriuretic peptide (BNP) 39.0 09/12/2016: TSH 0.58    Lipid Panel No results found for: CHOL, TRIG, HDL, CHOLHDL, VLDL, LDLCALC, LDLDIRECT    Wt Readings from Last 3 Encounters:  01/03/17 141 lb 6.4 oz (64.1 kg)  12/26/16 142 lb 2 oz (64.5 kg)  08/28/16 139 lb 1 oz (63.1 kg)      Other studies Reviewed: Additional studies/ records that were reviewed today include: . Review of the above records demonstrates:    ASSESSMENT AND PLAN:  1.  Shortness breath:  [name] presents for further evaluation of some shortness of breath. She has shortness of breath while cleaning the house but has not had any episodes of shortness breath while working out at the gym. She walks on the treadmill for 30 minutes at a time and really does quite well with that. She denies having any chest pain. BNP is normal ( 39)   The shortness of breath seems to be related to her prednisone dose. She's on prednisone for temporal arteritis. She recently had increased the dose of prednisone because of a flare up of her temporal arteritis.  Her blood pressure is too low for [place] to start any diaphoretic. She does not eat a lot of salt. We will get an echo cardiac gram for further assessment of her left ventricular function and cardiac structure. We will see her on an as-needed basis.   Current medicines are reviewed at length with the  patient today.  The patient does not have concerns regarding medicines.  Labs/ tests ordered today include:  No orders of the defined types were placed in this encounter.    Disposition:   FU with me as needed     Mertie Moores, MD  01/03/2017 11:09 AM    [facility] [address], [place], [place]  [address] Phone: [phone]; Fax: [phone]

## 2017-01-03 NOTE — Patient Instructions (Signed)
Medication Instructions:  Your physician recommends that you continue on your current medications as directed. Please refer to the Current Medication list given to you today.   Labwork: None Ordered   Testing/Procedures: Your physician has requested that you have an echocardiogram. Echocardiography is a painless test that uses sound waves to create images of your heart. It provides your doctor with information about the size and shape of your heart and how well your heart's chambers and valves are working. This procedure takes approximately one hour. There are no restrictions for this procedure.    Follow-Up: Your physician recommends that you schedule a follow-up appointment in: as needed with Dr. Nahser.    If you need a refill on your cardiac medications before your next appointment, please call your pharmacy.   Thank you for choosing [facility]! Emilee Market, RN [phone]    

## 2017-01-04 DIAGNOSIS — R0602 Shortness of breath: Secondary | ICD-10-CM | POA: Insufficient documentation

## 2017-01-04 DIAGNOSIS — R0600 Dyspnea, unspecified: Secondary | ICD-10-CM | POA: Insufficient documentation

## 2017-01-04 DIAGNOSIS — R0609 Other forms of dyspnea: Principal | ICD-10-CM

## 2017-01-11 ENCOUNTER — Ambulatory Visit (HOSPITAL_COMMUNITY)
Admission: RE | Admit: 2017-01-11 | Discharge: 2017-01-11 | Disposition: A | Discharge status: Home / Self Care | Payer: Medicare Other | Source: Ambulatory Visit | Attending: Cardiovascular Disease | Admitting: Cardiovascular Disease

## 2017-01-11 DIAGNOSIS — Z7952 Long term (current) use of systemic steroids: Secondary | ICD-10-CM | POA: Diagnosis not present

## 2017-01-11 DIAGNOSIS — R0609 Other forms of dyspnea: Secondary | ICD-10-CM | POA: Diagnosis not present

## 2017-01-11 DIAGNOSIS — E785 Hyperlipidemia, unspecified: Secondary | ICD-10-CM | POA: Diagnosis not present

## 2017-01-11 DIAGNOSIS — R0600 Dyspnea, unspecified: Secondary | ICD-10-CM

## 2017-01-11 NOTE — Progress Notes (Signed)
  Echocardiogram 2D Echocardiogram has been performed.  Jennifer Hull M 01/11/2017, 8:28 AM

## 2017-01-12 ENCOUNTER — Telehealth: Payer: Self-pay | Admitting: Cardiovascular Disease

## 2017-01-12 NOTE — Telephone Encounter (Signed)
Reviewed results of normal echo with patient who verbalized understanding and thanked me for the call

## 2017-01-12 NOTE — Telephone Encounter (Signed)
New message   Pt is returning call to [facility] about echo results.

## 2017-01-28 ENCOUNTER — Other Ambulatory Visit: Payer: Self-pay | Admitting: Family Medicine

## 2017-01-28 DIAGNOSIS — G4700 Insomnia, unspecified: Secondary | ICD-10-CM

## 2017-01-29 NOTE — Telephone Encounter (Signed)
Rx called in 

## 2017-01-29 NOTE — Telephone Encounter (Signed)
It is Ok to call in for Temazepam 15 mg to continue 1 tab at bedtime as needed #30/1. Thanks, BJ

## 2017-01-31 DIAGNOSIS — M316 Other giant cell arteritis: Secondary | ICD-10-CM | POA: Diagnosis not present

## 2017-01-31 DIAGNOSIS — M5136 Other intervertebral disc degeneration, lumbar region: Secondary | ICD-10-CM | POA: Diagnosis not present

## 2017-01-31 DIAGNOSIS — G629 Polyneuropathy, unspecified: Secondary | ICD-10-CM | POA: Diagnosis not present

## 2017-01-31 DIAGNOSIS — G8929 Other chronic pain: Secondary | ICD-10-CM | POA: Diagnosis not present

## 2017-01-31 DIAGNOSIS — R768 Other specified abnormal immunological findings in serum: Secondary | ICD-10-CM | POA: Diagnosis not present

## 2017-01-31 DIAGNOSIS — M79671 Pain in right foot: Secondary | ICD-10-CM | POA: Diagnosis not present

## 2017-01-31 DIAGNOSIS — L299 Pruritus, unspecified: Secondary | ICD-10-CM | POA: Diagnosis not present

## 2017-01-31 DIAGNOSIS — Z7952 Long term (current) use of systemic steroids: Secondary | ICD-10-CM | POA: Diagnosis not present

## 2017-01-31 DIAGNOSIS — Z6824 Body mass index (BMI) 24.0-24.9, adult: Secondary | ICD-10-CM | POA: Diagnosis not present

## 2017-02-02 ENCOUNTER — Ambulatory Visit (INDEPENDENT_AMBULATORY_CARE_PROVIDER_SITE_OTHER): Payer: Medicare Other | Admitting: Family Medicine

## 2017-02-02 ENCOUNTER — Encounter: Payer: Self-pay | Admitting: Family Medicine

## 2017-02-02 VITALS — BP 110/62 | HR 88 | Temp 98.4°F | Resp 12 | Ht 62.0 in | Wt 141.6 lb

## 2017-02-02 DIAGNOSIS — J069 Acute upper respiratory infection, unspecified: Secondary | ICD-10-CM | POA: Diagnosis not present

## 2017-02-02 DIAGNOSIS — R509 Fever, unspecified: Secondary | ICD-10-CM | POA: Diagnosis not present

## 2017-02-02 LAB — POCT INFLUENZA A/B
INFLUENZA B, POC: NEGATIVE
Influenza A, POC: NEGATIVE

## 2017-02-02 MED ORDER — BENZONATATE 100 MG PO CAPS: 200.0000 mg | ORAL_CAPSULE | Freq: Two times a day (BID) | ORAL | 0 refills | Status: DC | PRN

## 2017-02-02 NOTE — Progress Notes (Signed)
HPI:  ACUTE VISIT  Chief Complaint  Patient presents with  . Cough    body ache, fever and headache    [name] is a 76 y.o.female here today complaining of 1-2 days of respiratory symptoms.  Fever 102 F yesterday and 100 F today morning.  Cough  This is a new problem. The current episode started yesterday. The problem has been unchanged. Episode frequency: intermittent. The cough is non-productive. Associated symptoms include chills, a fever, headaches, myalgias, nasal congestion, postnasal drip and rhinorrhea. Pertinent negatives include no chest pain, ear congestion, ear pain, eye redness, hemoptysis, rash, sore throat, shortness of breath, sweats, weight loss or wheezing. Nothing aggravates the symptoms. Risk factors for lung disease include travel. She has tried rest (Tylenol) for the symptoms. The treatment provided mild relief. On Prednisone treatment.    Non productive cough. + Nasal congestion, rhinorrhea, and post nasal drainage. Global moderate headache, no associated nausea,vomiting,photophobia,or visual changes. Hx of temporal arteritis, she is on Prednisone , which has been decreased and now on 4 mg daily.  She just came back from [place] 01/26/17, arrived in [place] and stayed there a couple days. Husband has had same symptoms, started 1-2 days before hers. No known insect bite.  Hx of allergies: No  OTC medications for this problem: Tylenol, last dose today morning.  Symptoms otherwise stable.   Review of Systems  Constitutional: Positive for appetite change, chills, fatigue and fever. Negative for weight loss.  HENT: Positive for postnasal drip and rhinorrhea. Negative for ear discharge, ear pain, mouth sores, nosebleeds, sinus pressure and sore throat.   Eyes: Negative for pain, discharge, redness and visual disturbance.  Respiratory: Positive for cough. Negative for hemoptysis, shortness of breath and wheezing.   Cardiovascular: Negative for  chest pain and leg swelling.  Gastrointestinal: Negative for abdominal pain, diarrhea, nausea and vomiting.  Genitourinary: Negative for decreased urine volume and dysuria.  Musculoskeletal: Positive for arthralgias and myalgias. Negative for gait problem and neck pain.  Skin: Negative for pallor and rash.  Neurological: Positive for headaches. Negative for syncope and weakness.  Hematological: Negative for adenopathy. Does not bruise/bleed easily.  Psychiatric/Behavioral: Negative for confusion.     Current Outpatient Prescriptions on File Prior to Visit  Medication Sig Dispense Refill  . Acetaminophen (TYLENOL ARTHRITIS PAIN PO) Take 2 tablets by mouth 3 (three) times daily.    Marland Kitchen alendronate (FOSAMAX) 70 MG tablet Take 70 mg by mouth once a week.     Marland Kitchen atorvastatin (LIPITOR) 10 MG tablet Take 1 tablet (10 mg total) by mouth daily. 90 tablet 2  . Cholecalciferol (VITAMIN D3) 2000 units TABS Take 1 tablet by mouth daily.    Marland Kitchen levothyroxine (SYNTHROID, LEVOTHROID) 112 MCG tablet TAKE ONE TABLET BY MOUTH ONCE DAILY BEFORE BREAKFAST 90 tablet 0  . predniSONE (DELTASONE) 50 MG tablet Take 6 mg by mouth daily with breakfast. Patient is taking 4 mg    . sertraline (ZOLOFT) 100 MG tablet Take 1 and 1/2 tablets by mouth daily.    . temazepam (RESTORIL) 15 MG capsule TAKE ONE CAPSULE BY MOUTH ONCE DAILY AT BEDTIME AS NEEDED FOR SLEEP 30 capsule 1  . omeprazole (PRILOSEC) 20 MG capsule Take 1 capsule (20 mg total) by mouth daily. (Patient not taking: Reported on 02/02/2017) 30 capsule 3   No current facility-administered medications on file prior to visit.      Past Medical History:  Diagnosis Date  . Anxiety   . Arthritis   .  Depression   . Hyperlipidemia   . Osteoporosis   . Temporal arteritis ([place])   . Thyroid disease    Allergies  Allergen Reactions  . Tdap [Diphth-Acell Pertussis-Tetanus]     Social History   Social History  . Marital status: Married    Spouse name: N/A  .  Number of children: N/A  . Years of education: N/A   Social History Main Topics  . Smoking status: Never Smoker  . Smokeless tobacco: Never Used  . Alcohol use No  . Drug use: No  . Sexual activity: Not Asked   Other Topics Concern  . None   Social History Narrative  . None    Vitals:   02/02/17 1537  BP: 110/62  Pulse: 88  Resp: 12  Temp: 98.4 F (36.9 C)  O2 sat 97% at RA. Body mass index is 25.9 kg/m.   Physical Exam  Nursing note and vitals reviewed. Constitutional: She is oriented to person, place, and time. She appears well-developed and well-nourished. No distress.  HENT:  Head: Atraumatic.  Right Ear: Tympanic membrane, external ear and ear canal normal.  Left Ear: Tympanic membrane, external ear and ear canal normal.  Nose: Rhinorrhea present. Right sinus exhibits no maxillary sinus tenderness and no frontal sinus tenderness. Left sinus exhibits no maxillary sinus tenderness and no frontal sinus tenderness.  Mouth/Throat: Oropharynx is clear and moist and mucous membranes are normal.  Eyes: Conjunctivae and EOM are normal. Pupils are equal, round, and reactive to light.  Cardiovascular: Normal rate and regular rhythm.   No murmur heard. Respiratory: Effort normal and breath sounds normal. No respiratory distress.  Lymphadenopathy:       Head (right side): No submandibular adenopathy present.       Head (left side): No submandibular adenopathy present.    She has no cervical adenopathy.  Neurological: She is alert and oriented to person, place, and time. She has normal strength. No cranial nerve deficit. Gait normal.  Skin: Skin is warm. No rash noted. No erythema.  Psychiatric: She has a normal mood and affect. Her speech is normal.  Well groomed, good eye contact.     ASSESSMENT AND PLAN:   Shaquanta was seen today for cough.  Diagnoses and all orders for this visit:  Fever, unspecified -     POCT Influenza A/B  URI, acute -     benzonatate  (TESSALON) 100 MG capsule; Take 2 capsules (200 mg total) by mouth 2 (two) times daily as needed for cough. -     POCT Influenza A/B   Symptoms suggests a viral etiology,symptomatic treatment recommended.  Because her Hx of recent travel to [facility] also discussed some possible etiologies frequent in [place]: Chikungunya, Dengue, and Zika among some. No further work-up recommended at this time.  Instructed to monitor for signs of complications,clearly instructed about warning signs. If in 3-4 days still fever 100 F or higher, instructed to let me know and we can arrange for CXR. I also explained that cough and nasal congestion can last a few days and sometimes weeks. F/U as needed.     -Ms. Meliya Chaloux was advised to return or notify a doctor immediately if symptoms worsen or persist or new concerns arise, she voices understanding.       Kolden Dupee G. Martinique, MD  [facility]. [place] office.

## 2017-02-02 NOTE — Progress Notes (Signed)
Pre visit review using our clinic review tool, if applicable. No additional management support is needed unless otherwise documented below in the visit note. 

## 2017-02-02 NOTE — Patient Instructions (Addendum)
WE NOW OFFER   [facility] FAST TRACK!!!  SAME DAY Appointments for ACUTE CARE  Such as: Sprains, Injuries, cuts, abrasions, rashes, muscle pain, joint pain, back pain Colds, flu, sore throats, headache, allergies, cough, fever  Ear pain, sinus and eye infections Abdominal pain, nausea, vomiting, diarrhea, upset stomach Animal/insect bites  3 Easy Ways to Schedule: Walk-In Scheduling Call in scheduling Mychart Sign-up: [email or web address]    A few things to remember from today's visit:   URI, acute - Plan: benzonatate (TESSALON) 100 MG capsule  Body aches - Plan: POCT Influenza A/B  Fever, unspecified  viral infections are self-limited and we treat each symptom depending of severity.  Over the counter medications as decongestants and cold medications usually help, they need to be taken with caution if there is a history of high blood pressure or palpitations. Tylenol and/or Ibuprofen also helps with most symptoms (headache, muscle aching, fever,etc) Plenty of fluids. Honey helps with cough. Steam inhalations helps with runny nose, nasal congestion, and may prevent sinus infections. Cough and nasal congestion could last a few days and sometimes weeks. Please follow in not any better in 1-2 weeks or if symptoms get worse.  Please be sure medication list is accurate. If a new problem present, please set up appointment sooner than planned today.

## 2017-02-12 ENCOUNTER — Ambulatory Visit (INDEPENDENT_AMBULATORY_CARE_PROVIDER_SITE_OTHER): Payer: Medicare Other | Admitting: Family Medicine

## 2017-02-12 ENCOUNTER — Encounter: Payer: Self-pay | Admitting: Family Medicine

## 2017-02-12 VITALS — BP 130/70 | HR 80 | Temp 98.6°F | Resp 12 | Ht 62.0 in | Wt 141.4 lb

## 2017-02-12 DIAGNOSIS — R05 Cough: Secondary | ICD-10-CM

## 2017-02-12 DIAGNOSIS — J069 Acute upper respiratory infection, unspecified: Secondary | ICD-10-CM

## 2017-02-12 DIAGNOSIS — S301XXA Contusion of abdominal wall, initial encounter: Secondary | ICD-10-CM

## 2017-02-12 DIAGNOSIS — R059 Cough, unspecified: Secondary | ICD-10-CM

## 2017-02-12 DIAGNOSIS — K219 Gastro-esophageal reflux disease without esophagitis: Secondary | ICD-10-CM | POA: Diagnosis not present

## 2017-02-12 MED ORDER — HYDROCODONE-HOMATROPINE 5-1.5 MG/5ML PO SYRP: 5.0000 mL | ORAL_SOLUTION | Freq: Two times a day (BID) | ORAL | 0 refills | Status: AC | PRN

## 2017-02-12 NOTE — Progress Notes (Signed)
Pre visit review using our clinic review tool, if applicable. No additional management support is needed unless otherwise documented below in the visit note. 

## 2017-02-12 NOTE — Progress Notes (Signed)
HPI:   ACUTE VISIT:  Chief Complaint  Patient presents with  . Cough/Cold    [name] is a 76 y.o. female, who is here today complaining of persistent cough, rhinorrhea, and nasal congestion. Symptoms seems to be worse in the morning, yellowish rhinorrhea.  I saw [name] on 02/02/17 due to acute respiratory symptoms and 1-2 days of fever 102 F. Symptomatic treatment was recommended. Husband was also sick, he is recovering.  She is not longer having fever, temps 98-99 F.   5 days ago while she was having cough spell she had sudden stubbing like pain RLQ, severe, lasted a few minutes. Next day she noted ecchymosis on area.She denies nose/gum bleeding, gross hematuria,or blood in stool.  She has abdominal wall aching pain with coughing spells.  She has not noted other area with ecchymosis. She denies hemoptysis. She is not longer having abdominal pain .  She tells me that she does not feel good. + Fatigue and occasional chills. . She completed 3 days of Azithromycin 500 mg tab she had available.  She denies headache, visual changes, chest pain, dyspnea, wheezing, nausea,vomiting, or skin rash.  She has Hx of GERD, she is not taken Omeprazole. Also Hx of allergies, she is on Flonase nasal spray and OTC antihistaminic.   Review of Systems  Constitutional: Positive for chills and fatigue. Negative for fever.  HENT: Positive for congestion, postnasal drip and rhinorrhea. Negative for facial swelling, mouth sores, sinus pressure, sore throat and trouble swallowing.   Eyes: Negative for discharge and redness.  Respiratory: Positive for cough. Negative for shortness of breath and wheezing.   Cardiovascular: Negative for chest pain, palpitations and leg swelling.  Gastrointestinal: Positive for abdominal pain. Negative for blood in stool, diarrhea, nausea and vomiting.  Genitourinary: Negative for decreased urine volume and hematuria.  Musculoskeletal: Negative  for gait problem and myalgias.  Skin: Negative for rash and wound.  Allergic/Immunologic: Positive for environmental allergies.  Neurological: Negative for syncope, weakness and headaches.  Hematological: Negative for adenopathy. Bruises/bleeds easily.  Psychiatric/Behavioral: Positive for sleep disturbance. Negative for confusion. The patient is nervous/anxious.       Current Outpatient Prescriptions on File Prior to Visit  Medication Sig Dispense Refill  . Acetaminophen (TYLENOL ARTHRITIS PAIN PO) Take 2 tablets by mouth 3 (three) times daily.    Marland Kitchen alendronate (FOSAMAX) 70 MG tablet Take 70 mg by mouth once a week.     Marland Kitchen atorvastatin (LIPITOR) 10 MG tablet Take 1 tablet (10 mg total) by mouth daily. 90 tablet 2  . Cholecalciferol (VITAMIN D3) 2000 units TABS Take 1 tablet by mouth daily.    Marland Kitchen levothyroxine (SYNTHROID, LEVOTHROID) 112 MCG tablet TAKE ONE TABLET BY MOUTH ONCE DAILY BEFORE BREAKFAST 90 tablet 0  . omeprazole (PRILOSEC) 20 MG capsule Take 1 capsule (20 mg total) by mouth daily. 30 capsule 3  . sertraline (ZOLOFT) 100 MG tablet Take 1 and 1/2 tablets by mouth daily.    . temazepam (RESTORIL) 15 MG capsule TAKE ONE CAPSULE BY MOUTH ONCE DAILY AT BEDTIME AS NEEDED FOR SLEEP 30 capsule 1   No current facility-administered medications on file prior to visit.      Past Medical History:  Diagnosis Date  . Anxiety   . Arthritis   . Depression   . Hyperlipidemia   . Osteoporosis   . Temporal arteritis ([place])   . Thyroid disease    Allergies  Allergen Reactions  . Tdap [Diphth-Acell Pertussis-Tetanus]  Social History   Social History  . Marital status: Married    Spouse name: N/A  . Number of children: N/A  . Years of education: N/A   Social History Main Topics  . Smoking status: Never Smoker  . Smokeless tobacco: Never Used  . Alcohol use No  . Drug use: No  . Sexual activity: Not Asked   Other Topics Concern  . None   Social History Narrative  .  None    Vitals:   02/12/17 1548  BP: 130/70  Pulse: 80  Resp: 12  Temp: 98.6 F (37 C)  O2 sat at RA 97% Body mass index is 25.86 kg/m.   Physical Exam  Nursing note and vitals reviewed. Constitutional: She is oriented to person, place, and time. She appears well-developed. She does not appear ill. No distress.  HENT:  Head: Atraumatic.  Nose: Rhinorrhea present. Right sinus exhibits no maxillary sinus tenderness and no frontal sinus tenderness. Left sinus exhibits no maxillary sinus tenderness and no frontal sinus tenderness.  Mouth/Throat: Oropharynx is clear and moist and mucous membranes are normal.  Post nasal drainage. Frequently clearing throat.  Eyes: Conjunctivae and EOM are normal.  Neck: No muscular tenderness present. No edema and no erythema present.  Cardiovascular: Normal rate and regular rhythm.   No murmur heard. Respiratory: Effort normal and breath sounds normal. No stridor. No respiratory distress. She has no wheezes. She has no rales.  A couple coughing spells during OV.  GI: Soft. She exhibits no mass. There is no hepatomegaly. There is no tenderness.  Musculoskeletal: She exhibits no edema or tenderness.  Lymphadenopathy:    She has no cervical adenopathy.  Neurological: She is alert and oriented to person, place, and time. She has normal strength. Coordination and gait normal.  Skin: Skin is warm. Ecchymosis noted. No petechiae and no rash noted. No erythema.     Ecchymosis on RLQ,suprapubic and right below umbilicus. No tender or induration appreciated.   Psychiatric: Her speech is normal. Her mood appears anxious.  Well groomed, good eye contact.      ASSESSMENT AND PLAN:   [name] was seen today for cough/cold.  Diagnoses and all orders for this visit:  Upper respiratory tract infection, unspecified type  In general it seems like it is improving. She is not longer having fever. Today lung auscultation negative but because  immunosuppressant state due to chronic Prednisone treatment, CXR was ordered. Completed abx treatment, I still think this is a viral illness, in which abx do not help. Further recommendations will be given according to lab and imaging results.  -     DG Chest 2 View; Future  Cough  Explained that cough and nasal congestion.rhinorrhea could last days and even weeks. Hydrocodone side effects discussed, it may help with cough. She can take it mainly at bedtime,so she can have some rest. Plenty of po fluids,plain Mucinex, and honey may also help.  -     HYDROcodone-homatropine (HYCODAN) 5-1.5 MG/5ML syrup; Take 5 mLs by mouth every 12 (twelve) hours as needed for cough. -     DG Chest 2 View; Future  Traumatic ecchymosis of abdominal wall, initial encounter  It seemed to be caused by abdominal wall not direct trauma during cough spell. Chronic Prednisone use could be a predisposing factor for capillary fragility but some viral illness could also be associated to thrombocytopenia. Clearly instructed about warning signs.  -     CBC with Differential/Platelet   Gastroesophageal reflux disease,  esophagitis presence not specified  GERD precautions discussed. Recommend resuming Omeprazole 20 mg and to take it for 2-3 weeks.  Explained that GERD could also aggravate cough.    -Ms.Erza Hagger was advised to return or notify a doctor immediately if symptoms worsen or persist or new concerns arise.       Raphaela Cannaday G. Martinique, MD  [facility]. [place] office.

## 2017-02-12 NOTE — Patient Instructions (Signed)
A few things to remember from today's visit:   Upper respiratory tract infection, unspecified type - Plan: DG Chest 2 View  Cough - Plan: HYDROcodone-homatropine (HYCODAN) 5-1.5 MG/5ML syrup, DG Chest 2 View  Traumatic ecchymosis of abdominal wall, initial encounter - Plan: CBC with Differential/Platelet   Honey helps with cough. Steam inhalations helps with runny nose, nasal congestion, and may prevent sinus infections. Cough and nasal congestion could last a few days and sometimes weeks. Please follow in not any better in 1-2 weeks or if symptoms get worse.   Please be sure medication list is accurate. If a new problem present, please set up appointment sooner than planned today.

## 2017-02-13 ENCOUNTER — Other Ambulatory Visit: Payer: Medicare Other

## 2017-02-13 ENCOUNTER — Ambulatory Visit (INDEPENDENT_AMBULATORY_CARE_PROVIDER_SITE_OTHER)
Admission: RE | Admit: 2017-02-13 | Discharge: 2017-02-13 | Disposition: A | Discharge status: Home / Self Care | Payer: Medicare Other | Source: Ambulatory Visit | Attending: Family Medicine | Admitting: Family Medicine

## 2017-02-13 ENCOUNTER — Encounter: Payer: Self-pay | Admitting: Family Medicine

## 2017-02-13 DIAGNOSIS — R05 Cough: Secondary | ICD-10-CM | POA: Diagnosis not present

## 2017-02-13 DIAGNOSIS — S301XXA Contusion of abdominal wall, initial encounter: Secondary | ICD-10-CM

## 2017-02-13 DIAGNOSIS — R059 Cough, unspecified: Secondary | ICD-10-CM

## 2017-02-13 DIAGNOSIS — J069 Acute upper respiratory infection, unspecified: Secondary | ICD-10-CM | POA: Diagnosis not present

## 2017-02-13 DIAGNOSIS — R0602 Shortness of breath: Secondary | ICD-10-CM | POA: Diagnosis not present

## 2017-02-13 LAB — CBC WITH DIFFERENTIAL/PLATELET
Basophils Absolute: 113 cells/uL (ref 0–200)
Basophils Relative: 1 %
EOS ABS: 113 {cells}/uL (ref 15–500)
Eosinophils Relative: 1 %
HEMATOCRIT: 39.7 % (ref 35.0–45.0)
Hemoglobin: 13.1 g/dL (ref 11.7–15.5)
Lymphocytes Relative: 25 %
Lymphs Abs: 2825 cells/uL (ref 850–3900)
MCH: 30 pg (ref 27.0–33.0)
MCHC: 33 g/dL (ref 32.0–36.0)
MCV: 91.1 fL (ref 80.0–100.0)
MONOS PCT: 8 %
MPV: 9 fL (ref 7.5–12.5)
Monocytes Absolute: 904 cells/uL (ref 200–950)
NEUTROS ABS: 7345 {cells}/uL (ref 1500–7800)
Neutrophils Relative %: 65 %
PLATELETS: 332 10*3/uL (ref 140–400)
RBC: 4.36 MIL/uL (ref 3.80–5.10)
RDW: 14 % (ref 11.0–15.0)
WBC: 11.3 10*3/uL — ABNORMAL HIGH (ref 3.8–10.8)

## 2017-02-14 DIAGNOSIS — L299 Pruritus, unspecified: Secondary | ICD-10-CM | POA: Diagnosis not present

## 2017-02-14 DIAGNOSIS — G8929 Other chronic pain: Secondary | ICD-10-CM | POA: Diagnosis not present

## 2017-02-14 DIAGNOSIS — G629 Polyneuropathy, unspecified: Secondary | ICD-10-CM | POA: Diagnosis not present

## 2017-02-14 DIAGNOSIS — Z6824 Body mass index (BMI) 24.0-24.9, adult: Secondary | ICD-10-CM | POA: Diagnosis not present

## 2017-02-14 DIAGNOSIS — M316 Other giant cell arteritis: Secondary | ICD-10-CM | POA: Diagnosis not present

## 2017-02-14 DIAGNOSIS — M5136 Other intervertebral disc degeneration, lumbar region: Secondary | ICD-10-CM | POA: Diagnosis not present

## 2017-02-14 DIAGNOSIS — R768 Other specified abnormal immunological findings in serum: Secondary | ICD-10-CM | POA: Diagnosis not present

## 2017-02-14 DIAGNOSIS — M79671 Pain in right foot: Secondary | ICD-10-CM | POA: Diagnosis not present

## 2017-02-14 DIAGNOSIS — Z7952 Long term (current) use of systemic steroids: Secondary | ICD-10-CM | POA: Diagnosis not present

## 2017-02-15 ENCOUNTER — Encounter: Payer: Self-pay | Admitting: Family Medicine

## 2017-03-02 DIAGNOSIS — M19212 Secondary osteoarthritis, left shoulder: Secondary | ICD-10-CM | POA: Diagnosis not present

## 2017-04-05 DIAGNOSIS — H1013 Acute atopic conjunctivitis, bilateral: Secondary | ICD-10-CM | POA: Diagnosis not present

## 2017-04-05 DIAGNOSIS — H04123 Dry eye syndrome of bilateral lacrimal glands: Secondary | ICD-10-CM | POA: Diagnosis not present

## 2017-04-05 DIAGNOSIS — H26493 Other secondary cataract, bilateral: Secondary | ICD-10-CM | POA: Diagnosis not present

## 2017-04-06 DIAGNOSIS — M19212 Secondary osteoarthritis, left shoulder: Secondary | ICD-10-CM | POA: Diagnosis not present

## 2017-04-10 ENCOUNTER — Encounter: Payer: Self-pay | Admitting: Family Medicine

## 2017-04-10 ENCOUNTER — Ambulatory Visit (INDEPENDENT_AMBULATORY_CARE_PROVIDER_SITE_OTHER): Payer: Medicare Other | Admitting: Family Medicine

## 2017-04-10 VITALS — BP 120/68 | HR 92 | Resp 12 | Ht 62.0 in | Wt 142.0 lb

## 2017-04-10 DIAGNOSIS — N751 Abscess of Bartholin's gland: Secondary | ICD-10-CM | POA: Diagnosis not present

## 2017-04-10 MED ORDER — SULFAMETHOXAZOLE-TRIMETHOPRIM 800-160 MG PO TABS: 1.0000 | ORAL_TABLET | Freq: Two times a day (BID) | ORAL | 0 refills | Status: AC

## 2017-04-10 NOTE — Patient Instructions (Signed)
A few things to remember from today's visit:   Bartholin's gland abscess - Plan: sulfamethoxazole-trimethoprim (BACTRIM DS,SEPTRA DS) 800-160 MG tablet   Quiste o absceso de Bartolino (Bartholin Cyst or Abscess) Un quiste de Bartolino es un saco lleno de lquido que se forma en una glndula de [place]. Las glndulas de Bartolino son pequeas glndulas que se encuentran entre los pliegues de la piel (labios vaginales) a ambos lados del orificio inferior de la vagina. Estas glndulas secretan un lquido que humedece la parte externa de la vagina durante las relaciones sexuales. Un quiste de [employer] produce un bulto al costado de la vagina. Un quiste que no es grande ni est infectado puede no causar sntomas ni problemas. Sin embargo, si el lquido en su interior se infecta, el quiste puede convertirse en un absceso. Un absceso puede causar [employer]. CAUSAS Puede formarse un quiste de Bartolino cuando se obstruye el conducto de la glndula. En muchos de [employer], se desconoce la causa de esto. Diferentes tipos de bacterias pueden hacer que el quiste se infecte y se convierta en un absceso. FACTORES DE RIESGO Puede tener un riesgo ms alto de tener un quiste o un absceso de Bartolino si:  Es mujer y est en edad de Media planner.  Tiene antecedentes de quistes o abscesos de [place].  Tiene diabetes.  Tiene una enfermedad de trasmisin sexual (ETS). SIGNOS Y SNTOMAS La gravedad de los sntomas vara en funcin del tamao del quiste y de si est infectado. Entre los sntomas se pueden incluir los siguientes:  Un bulto o una hinchazn cerca del orificio inferior de la vagina.  Molestias o dolor.  Enrojecimiento.  Camuy.  Dolor al caminar.  Secrecin de lquido en la zona. DIAGNSTICO El mdico podr hacer el diagnstico en funcin de los sntomas y de un examen fsico. El mdico determinar si hay hinchazn en la zona vaginal. Para verificar  si hay infecciones, podrn hacerle anlisis de sangre. Adems, se puede tomar [place] del lquido del quiste para que la analicen en un laboratorio. TRATAMIENTO Es posible que los quistes pequeos que no estn infectados no requieran tratamiento. Estos suelen Publishing copy. El Gaffer baos calientes y el uso de compresas tibias, que tambin pueden ser parte del tratamiento de un absceso. Las opciones de tratamiento para un quiste grande pueden incluir lo siguiente:  Antibiticos.  Un procedimiento quirrgico para Doctor, hospital. Podr realizarse uno de los siguientes procedimientos: ? Incisin y drenaje. Se realiza una incisin en el quiste o el absceso para que el lquido drene. Puede colocarse un catter dentro del quiste para evitar que se cierre y vuelva a llenarse de lquido. El catter se retirar despus de la visita de control a Teaching laboratory technician (gineclogo). ? Marsupializacin. El quiste o el absceso se abren y se mantienen abiertos al suturar los bordes de la piel a las paredes del quiste o del absceso. Esto permite que sigan drenando y no se vuelvan a llenar de lquido. Si tiene quistes o abscesos que vuelven a formarse y han requerido incisin y Presenter, broadcasting, el mdico puede plantearle la posibilidad de que se someta a una ciruga para extirpar la glndula de [place]. [place] los medicamentos solamente como se lo haya indicado el mdico.  Si le recetaron antibiticos, asegrese de terminarlos, incluso si comienza a sentirse mejor.  Pngase compresas hmedas tibias en la zona o hgase baos de asiento tibios  que le cubran la zona plvica varias veces por da o como se lo haya indicado el mdico.  No se apriete el quiste ni le aplique mucha presin.  No tenga relaciones sexuales hasta tanto el quiste haya desaparecido.  Si le abrieron el quiste o el absceso, tal vez le hayan colocado un pequeo trozo de gasa o  un drenaje en la zona. No retire la gasa ni el drenaje hasta que el mdico se lo indique.  Use protectores femeninos, no tampones, si es necesario en caso de que haya supuracin o sangrado.  Concurra a todas las visitas de control como se lo haya indicado el mdico. Esto es importante. PREVENCIN Tome estas medidas para ayudar a Product/process development scientist que vuelva a formarse un quiste de Bartolino:  Mantenga una buena higiene.  Higienice la zona vaginal con un jabn y un pao suaves cuando se bae.  Practique el sexo seguro para evitar las ETS. SOLICITE ATENCIN MDICA SI:  Ha aumentado el dolor, la hinchazn o el enrojecimiento en la zona del quiste.  Tiene secrecin similar al pus que proviene del quiste.  Tiene fiebre. Esta informacin no tiene Marine scientist el consejo del mdico. Asegrese de hacerle al mdico cualquier pregunta que tenga. Document Released: 09/25/2005 Document Revised: 01/17/2016 Document Reviewed: 05/11/2014 Elsevier Interactive Patient Education  2018 [employer].  Please be sure medication list is accurate. If a new problem present, please set up appointment sooner than planned today.

## 2017-04-10 NOTE — Progress Notes (Signed)
HPI:  ACUTE VISIT:  Chief Complaint  Patient presents with  . Lesion in genital area    [name] is a 76 y.o. female, who is here today complaining of "a few days" of tenderness on right side of external genital area. It is getting worse.  She describes lesion as a "little cyst." She denies any history of trauma.  Rash  This is a new problem. The problem has been gradually worsening since onset. The affected locations include the genitalia. The rash is characterized by pain, redness and swelling. Associated symptoms include fatigue (No more than usual) and joint pain. Pertinent negatives include no anorexia, fever, shortness of breath or vomiting. Past treatments include antibiotic cream. The treatment provided no relief.   Lesion is "very" tender (sore), exacerbated by walking and by sitting, alleviated by standing up or laying down.  She has tried over-the-counter Neosporin, which has not helped.   She denies fever, chills, abdominal pain, vaginal discharge, vaginal bleeding, or urinary symptoms. History of arthralgias and back pain, both stable.   Review of Systems  Constitutional: Positive for fatigue (No more than usual). Negative for appetite change, chills and fever.  Respiratory: Negative for chest tightness and shortness of breath.   Cardiovascular: Negative for leg swelling.  Gastrointestinal: Negative for abdominal pain, anorexia, nausea and vomiting.  Genitourinary: Negative for decreased urine volume, dysuria, hematuria, vaginal bleeding and vaginal discharge.  Musculoskeletal: Positive for arthralgias, back pain and joint pain. Negative for gait problem and myalgias.  Skin: Positive for rash. Negative for wound.  Hematological: Negative for adenopathy. Does not bruise/bleed easily.  Psychiatric/Behavioral: Negative for confusion. The patient is nervous/anxious.       Current Outpatient Prescriptions on File Prior to Visit  Medication Sig Dispense  Refill  . Acetaminophen (TYLENOL ARTHRITIS PAIN PO) Take 2 tablets by mouth as needed.     Marland Kitchen alendronate (FOSAMAX) 70 MG tablet Take 70 mg by mouth once a week.     Marland Kitchen atorvastatin (LIPITOR) 10 MG tablet Take 1 tablet (10 mg total) by mouth daily. 90 tablet 2  . Cholecalciferol (VITAMIN D3) 2000 units TABS Take 1 tablet by mouth daily.    Marland Kitchen levothyroxine (SYNTHROID, LEVOTHROID) 112 MCG tablet TAKE ONE TABLET BY MOUTH ONCE DAILY BEFORE BREAKFAST 90 tablet 0  . omeprazole (PRILOSEC) 20 MG capsule Take 1 capsule (20 mg total) by mouth daily. 30 capsule 3  . predniSONE (DELTASONE) 1 MG tablet Take 2 mg by mouth daily with breakfast.     . sertraline (ZOLOFT) 100 MG tablet Take 1 and 1/2 tablets by mouth daily.    . temazepam (RESTORIL) 15 MG capsule TAKE ONE CAPSULE BY MOUTH ONCE DAILY AT BEDTIME AS NEEDED FOR SLEEP 30 capsule 1   No current facility-administered medications on file prior to visit.      Past Medical History:  Diagnosis Hull  . Anxiety   . Arthritis   . Depression   . Hyperlipidemia   . Osteoporosis   . Temporal arteritis ([place])   . Thyroid disease    Allergies  Allergen Reactions  . Tdap [Diphth-Acell Pertussis-Tetanus]     Social History   Social History  . Marital status: Married    Spouse name: N/A  . Number of children: N/A  . Years of education: N/A   Social History Main Topics  . Smoking status: Never Smoker  . Smokeless tobacco: Never Used  . Alcohol use No  . Drug use: No  .  Sexual activity: Not Asked   Other Topics Concern  . None   Social History Narrative  . None    Vitals:   04/10/17 1457  BP: 120/68  Pulse: 92  Resp: 12   Body mass index is 25.97 kg/m.  Physical Exam  Nursing note and vitals reviewed. Constitutional: She is oriented to person, place, and time. She appears well-developed. No distress.  HENT:  Head: Atraumatic.  Eyes: Conjunctivae are normal.  Cardiovascular: Normal rate and regular rhythm.   Respiratory:  Effort normal. No respiratory distress.  GI: Soft. She exhibits no mass. There is no tenderness. There is no CVA tenderness.  Genitourinary:    There is tenderness and lesion on the right labia. No erythema in the vagina. No vaginal discharge found.  Genitourinary Comments: Right major labium with nodular ,erythematous lesion, tender,indurated with no fluctuant area. About 2-2.5 cm.  Musculoskeletal: She exhibits no edema.  Neurological: She is alert and oriented to person, place, and time.  Skin: Skin is warm. No rash noted.  Psychiatric: Her speech is normal. Her mood appears anxious.  Appropriately groomed, good eye contact.    ASSESSMENT AND PLAN:   Jennifer Hull was seen today for lesion in genital area.  Diagnoses and all orders for this visit:  Bartholin's gland abscess -     sulfamethoxazole-trimethoprim (BACTRIM DS,SEPTRA DS) 800-160 MG tablet; Take 1 tablet by mouth 2 (two) times daily.   Small and not ready for I&D. Abx treatment started. Some side effects discussed.  Sitz bath may help.  Instructed about warning signs.  If not greatly better in 48-72 hours gyn evaluation recommended.     -[name] was advised to seek immediate medical attention if symptoms suddenly get worse or to follow if symptoms persist or new concerns arise. She voices understanding.      Betty G. Martinique, MD  [facility]. [place] office.

## 2017-04-12 DIAGNOSIS — Z961 Presence of intraocular lens: Secondary | ICD-10-CM | POA: Diagnosis not present

## 2017-04-12 DIAGNOSIS — H10413 Chronic giant papillary conjunctivitis, bilateral: Secondary | ICD-10-CM | POA: Diagnosis not present

## 2017-04-13 ENCOUNTER — Telehealth: Payer: Self-pay | Admitting: Family Medicine

## 2017-04-13 ENCOUNTER — Encounter: Payer: Self-pay | Admitting: Family Medicine

## 2017-04-13 DIAGNOSIS — N751 Abscess of Bartholin's gland: Secondary | ICD-10-CM

## 2017-04-13 NOTE — Telephone Encounter (Signed)
Pt is returning sarah call °

## 2017-04-13 NOTE — Telephone Encounter (Signed)
Pt would like to have a call on a personal matter.

## 2017-04-13 NOTE — Telephone Encounter (Signed)
Patient wanted the referral to GYN as discussed during her visit if abscess did not get better in 2-3 days. Referral placed.

## 2017-04-13 NOTE — Telephone Encounter (Signed)
Left voicemail for patient to call the office back.   

## 2017-04-23 ENCOUNTER — Encounter: Payer: Self-pay | Admitting: Family Medicine

## 2017-04-24 DIAGNOSIS — Z7952 Long term (current) use of systemic steroids: Secondary | ICD-10-CM | POA: Diagnosis not present

## 2017-04-24 DIAGNOSIS — M8589 Other specified disorders of bone density and structure, multiple sites: Secondary | ICD-10-CM | POA: Diagnosis not present

## 2017-04-24 DIAGNOSIS — M316 Other giant cell arteritis: Secondary | ICD-10-CM | POA: Diagnosis not present

## 2017-04-24 DIAGNOSIS — Z6824 Body mass index (BMI) 24.0-24.9, adult: Secondary | ICD-10-CM | POA: Diagnosis not present

## 2017-04-24 DIAGNOSIS — M5136 Other intervertebral disc degeneration, lumbar region: Secondary | ICD-10-CM | POA: Diagnosis not present

## 2017-04-24 DIAGNOSIS — R768 Other specified abnormal immunological findings in serum: Secondary | ICD-10-CM | POA: Diagnosis not present

## 2017-04-24 DIAGNOSIS — G8929 Other chronic pain: Secondary | ICD-10-CM | POA: Diagnosis not present

## 2017-04-24 DIAGNOSIS — R928 Other abnormal and inconclusive findings on diagnostic imaging of breast: Secondary | ICD-10-CM | POA: Diagnosis not present

## 2017-04-29 ENCOUNTER — Other Ambulatory Visit: Payer: Self-pay | Admitting: Family Medicine

## 2017-04-29 DIAGNOSIS — E039 Hypothyroidism, unspecified: Secondary | ICD-10-CM

## 2017-04-30 ENCOUNTER — Other Ambulatory Visit: Payer: Self-pay | Admitting: Physician Assistant

## 2017-05-01 ENCOUNTER — Other Ambulatory Visit: Payer: Self-pay | Admitting: Physician Assistant

## 2017-05-01 DIAGNOSIS — M8580 Other specified disorders of bone density and structure, unspecified site: Secondary | ICD-10-CM

## 2017-05-11 ENCOUNTER — Ambulatory Visit
Admission: RE | Admit: 2017-05-11 | Discharge: 2017-05-11 | Disposition: A | Discharge status: Home / Self Care | Payer: Medicare Other | Source: Ambulatory Visit | Attending: Physician Assistant | Admitting: Physician Assistant

## 2017-05-11 DIAGNOSIS — M810 Age-related osteoporosis without current pathological fracture: Secondary | ICD-10-CM | POA: Diagnosis not present

## 2017-05-11 DIAGNOSIS — M8580 Other specified disorders of bone density and structure, unspecified site: Secondary | ICD-10-CM

## 2017-05-11 DIAGNOSIS — Z780 Asymptomatic menopausal state: Secondary | ICD-10-CM | POA: Diagnosis not present

## 2017-05-14 DIAGNOSIS — Z961 Presence of intraocular lens: Secondary | ICD-10-CM | POA: Diagnosis not present

## 2017-05-14 DIAGNOSIS — H10413 Chronic giant papillary conjunctivitis, bilateral: Secondary | ICD-10-CM | POA: Diagnosis not present

## 2017-05-15 ENCOUNTER — Encounter: Payer: Self-pay | Admitting: Obstetrics and Gynecology

## 2017-05-15 ENCOUNTER — Ambulatory Visit (INDEPENDENT_AMBULATORY_CARE_PROVIDER_SITE_OTHER): Payer: Medicare Other | Admitting: Obstetrics and Gynecology

## 2017-05-15 VITALS — Ht 62.0 in

## 2017-05-15 DIAGNOSIS — Z01419 Encounter for gynecological examination (general) (routine) without abnormal findings: Secondary | ICD-10-CM | POA: Diagnosis not present

## 2017-05-15 NOTE — Progress Notes (Signed)
76 yo referred for the evaluation of a bartholin's abscess and annual exam. Patient diagnosed with a bartholin's abscess on 7/3 which was not ready for I&D. Patient treated with Bactrim and instructed to perform sitz bath. Patient reports completing antibiotics course as prescribed and noted some spontaneous drainage of purulent material. She reports complete resolution of her symptoms. She denies any pelvic pain or abnormal discharge. She is not sexually active. She denies any urinary incontinence. Patient had a hysterectomy due to fibroid uterus  Past Medical History:  Diagnosis Date  . Anxiety   . Arthritis   . Depression   . Hyperlipidemia   . Osteoporosis   . Temporal arteritis ([place])   . Thyroid disease    Past Surgical History:  Procedure Laterality Date  . ABDOMINAL HYSTERECTOMY    . BREAST SURGERY     biopsy  . TONSILLECTOMY     Family History  Problem Relation Age of Onset  . Heart disease Mother   . Kidney disease Mother   . Heart disease Father   . Kidney disease Father    Social History  Substance Use Topics  . Smoking status: Never Smoker  . Smokeless tobacco: Never Used  . Alcohol use No   ROS See pertinent in HPI   GENERAL: Well-developed, well-nourished female in no acute distress.  HEENT: Normocephalic, atraumatic. Sclerae anicteric.  NECK: Supple. Normal thyroid.  LUNGS: Clear to auscultation bilaterally.  HEART: Regular rate and rhythm. BREASTS: Symmetric in size. No palpable masses or lymphadenopathy, skin changes, or nipple drainage. ABDOMEN: Soft, nontender, nondistended.  PELVIC: Normal external female genitalia. Vagina is pale.  Normal discharge. No adnexal mass or tenderness. EXTREMITIES: No cyanosis, clubbing, or edema, 2+ distal pulses.   A/P 76 yo here for annual exam - Complete resolution of bartholin abscess - screening mammogram ordered - Patient to follow up with PCP for routine care - RTC prn

## 2017-05-15 NOTE — Progress Notes (Signed)
Pt was told by PCP cyst on vagina is Bartholinitis. Pt has been using warm compresses causing the cyst to shrink and it not painful anymore. Last [facility] July 2017 - abnormal in [place] but was told to repeat in 1 yr.

## 2017-05-17 ENCOUNTER — Encounter: Payer: Self-pay | Admitting: *Deleted

## 2017-05-17 ENCOUNTER — Other Ambulatory Visit: Payer: Self-pay | Admitting: Obstetrics and Gynecology

## 2017-05-17 DIAGNOSIS — Z1231 Encounter for screening mammogram for malignant neoplasm of breast: Secondary | ICD-10-CM

## 2017-05-25 ENCOUNTER — Ambulatory Visit
Admission: RE | Admit: 2017-05-25 | Discharge: 2017-05-25 | Disposition: A | Discharge status: Home / Self Care | Payer: PRIVATE HEALTH INSURANCE | Source: Ambulatory Visit | Attending: Obstetrics and Gynecology | Admitting: Obstetrics and Gynecology

## 2017-05-25 DIAGNOSIS — Z1231 Encounter for screening mammogram for malignant neoplasm of breast: Secondary | ICD-10-CM | POA: Diagnosis not present

## 2017-06-01 ENCOUNTER — Encounter: Payer: Self-pay | Admitting: Obstetrics and Gynecology

## 2017-06-04 DIAGNOSIS — M67912 Unspecified disorder of synovium and tendon, left shoulder: Secondary | ICD-10-CM | POA: Diagnosis not present

## 2017-06-04 DIAGNOSIS — M19211 Secondary osteoarthritis, right shoulder: Secondary | ICD-10-CM | POA: Diagnosis not present

## 2017-06-14 ENCOUNTER — Other Ambulatory Visit: Payer: Self-pay | Admitting: Orthopedic Surgery

## 2017-06-14 ENCOUNTER — Other Ambulatory Visit: Payer: Self-pay | Admitting: Family Medicine

## 2017-06-14 DIAGNOSIS — E039 Hypothyroidism, unspecified: Secondary | ICD-10-CM

## 2017-06-15 DIAGNOSIS — M19211 Secondary osteoarthritis, right shoulder: Secondary | ICD-10-CM | POA: Diagnosis not present

## 2017-06-19 ENCOUNTER — Telehealth: Payer: Self-pay

## 2017-06-19 NOTE — Telephone Encounter (Signed)
Can we get her scheduled for a 30 minute visit for surgery clearance?  Thanks!

## 2017-06-19 NOTE — Telephone Encounter (Signed)
Noted,  Thank you!

## 2017-06-19 NOTE — Telephone Encounter (Signed)
Patient is scheduled for 9/19 for surgery clearance.

## 2017-06-19 NOTE — Telephone Encounter (Signed)
Pt states she has postponed her surgery indefinitely. Pt already had a follow up wed, sept 19. Advised pt to keep that appt and she verbalized understanding.

## 2017-06-25 DIAGNOSIS — H10413 Chronic giant papillary conjunctivitis, bilateral: Secondary | ICD-10-CM | POA: Diagnosis not present

## 2017-06-25 DIAGNOSIS — Z961 Presence of intraocular lens: Secondary | ICD-10-CM | POA: Diagnosis not present

## 2017-06-26 NOTE — Progress Notes (Signed)
HPI:   Jennifer Hull is a 76 y.o. female, who is here today for 6 months follow up.   She was last seen on 04/10/17 for acute visit.  Since her last OV she has seen gyn,rheuma,and ortho. Hx of temporal arteritis, she is not longer on Prednisone.   Hypothyroidism:  Currently she is on Levothyroxine 112 mcg 1 tab daily x 5 days and 1/2 tab x 2 days. . Tolerating medication well, no side effects reported. She has not noted palpitations, abdominal pain, changes in bowel habits, tremor, cold/heat intolerance, or abnormal weight loss.  Lab Results  Component Value Date   TSH 0.58 09/12/2016   Anxiety and depression: She is currently on Zoloft 150 mg daily. Takes Temazepam 15 mg daily as needed. She has been taking it more frequent because lower back pain or shoulder pain keeping her from sleep. She is tolerating medication well, denies any side effect.  She is also following with ortho for shoulder pain, surgery was recommended and she needs pre op evaluation.  She has decided to postpone surgery. She is afraid of having complications related to procedure. She denies any complication during or right after surgical procedures.   Evaluated by Dr Acie Fredrickson because exertional dyspnea, 01/03/17.  Denies headache, visual changes, chest pain, dyspnea, palpitation, claudication, focal weakness, or edema.  Echo 01/11/17: Left ventricle: The cavity size was normal. Systolic function was normal. The estimated ejection fraction was in the range of 55% to 60%. Wall motion was normal; there were no regional wall motion abnormalities. Left ventricular diastolic function parameters were normal.  GERD:  12/26/16 c/o upper abdomen burning pain and persistent dry cough.  Omeprazole 20 mg started. Symptoms have resolved.  Denies abdominal pain, nausea, vomiting, changes in bowel habits, blood in stool or melena.   Osteoporosis  She is currently on Fosamax, which according to pt, she has  taken for almost 5 years.She wonders if she needs to stop it. She started Fosamax about 6 years ago, discontinued after 2 years and resumed 1.5-2 years later. Stopped it again for 6 months ago after taking it for 2.5 years. Her rheumatologists recently ordered DEXA, which showed osteoporosis (05/2017), so Fosamax was recommended.  She is also on Vit D 2000 U, she is not on Ca++ supplementation.   HLD:  She is currently on Lipitor 10 mg daily. She follows low fat diet. Tolerating medication well, denies side effects.   Review of Systems  Constitutional: Positive for fatigue (no more than usual). Negative for activity change, appetite change, fever and unexpected weight change.  HENT: Negative for mouth sores, nosebleeds, sore throat and trouble swallowing.   Eyes: Negative for redness and visual disturbance.  Respiratory: Negative for cough, shortness of breath and wheezing.   Cardiovascular: Negative for chest pain, palpitations and leg swelling.  Gastrointestinal: Negative for abdominal pain, nausea and vomiting.       Negative for changes in bowel habits.  Endocrine: Negative for cold intolerance and heat intolerance.  Genitourinary: Negative for decreased urine volume, dysuria and hematuria.  Musculoskeletal: Positive for arthralgias (Hx of OA) and back pain. Negative for gait problem.  Skin: Negative for pallor and rash.  Allergic/Immunologic: Positive for environmental allergies.  Neurological: Negative for syncope, weakness, numbness and headaches.  Psychiatric/Behavioral: Positive for sleep disturbance. Negative for confusion. The patient is nervous/anxious.      Current Outpatient Prescriptions on File Prior to Visit  Medication Sig Dispense Refill  . Acetaminophen (TYLENOL ARTHRITIS PAIN  PO) Take 2 tablets by mouth as needed.     Marland Kitchen alendronate (FOSAMAX) 70 MG tablet Take 70 mg by mouth once a week.     Marland Kitchen atorvastatin (LIPITOR) 10 MG tablet Take 1 tablet (10 mg total) by  mouth daily. 90 tablet 2  . Cholecalciferol (VITAMIN D3) 2000 units TABS Take 1 tablet by mouth daily.    Marland Kitchen ibuprofen (ADVIL,MOTRIN) 600 MG tablet Take 600 mg by mouth as needed.    Marland Kitchen levothyroxine (SYNTHROID, LEVOTHROID) 112 MCG tablet Take 1 tablet by mouth for 5 days, and 1/2 tablet on the other 2 days. 90 tablet 0  . omeprazole (PRILOSEC) 20 MG capsule Take 1 capsule (20 mg total) by mouth daily. 30 capsule 3  . Sertraline HCl (ZOLOFT PO) Take 150 mg by mouth.    . temazepam (RESTORIL) 15 MG capsule TAKE ONE CAPSULE BY MOUTH ONCE DAILY AT BEDTIME AS NEEDED FOR SLEEP 30 capsule 1   No current facility-administered medications on file prior to visit.      Past Medical History:  Diagnosis Date  . Anxiety   . Arthritis   . Depression   . Hyperlipidemia   . Osteoporosis   . Temporal arteritis ([place])   . Thyroid disease    Allergies  Allergen Reactions  . Tdap [Diphth-Acell Pertussis-Tetanus]     Social History   Social History  . Marital status: Married    Spouse name: N/A  . Number of children: N/A  . Years of education: N/A   Social History Main Topics  . Smoking status: Former Smoker    Types: Cigarettes  . Smokeless tobacco: Never Used  . Alcohol use No  . Drug use: No  . Sexual activity: Yes    Partners: Male    Birth control/ protection: Surgical   Other Topics Concern  . None   Social History Narrative  . None    Vitals:   06/27/17 1423  BP: 108/60  Pulse: 85  Resp: 12  Temp: 98.2 F (36.8 C)  SpO2: 97%   Body mass index is 26.01 kg/m.  Physical Exam  Nursing note and vitals reviewed. Constitutional: She is oriented to person, place, and time. She appears well-developed. No distress.  HENT:  Head: Normocephalic and atraumatic.  Mouth/Throat: Oropharynx is clear and moist and mucous membranes are normal.  Eyes: Pupils are equal, round, and reactive to light. Conjunctivae are normal.  Neck: No tracheal deviation present. No thyroid mass and no  thyromegaly present.  Cardiovascular: Normal rate and regular rhythm.   No murmur heard. Pulses:      Dorsalis pedis pulses are 2+ on the right side, and 2+ on the left side.  Respiratory: Effort normal and breath sounds normal. No respiratory distress.  GI: Soft. She exhibits no mass. There is no hepatomegaly. There is no tenderness.  Musculoskeletal: She exhibits no edema.  Antalgic gait. Lower back pain exacerbated by movement on exam table.  Lymphadenopathy:    She has no cervical adenopathy.  Neurological: She is alert and oriented to person, place, and time. She has normal strength. Coordination and gait normal.  Skin: Skin is warm. No rash noted. No erythema.  Psychiatric: Her mood appears anxious.  Well groomed, good eye contact.     ASSESSMENT AND PLAN:   Ms. Karalyne Nusser was seen today for 6 months follow-up.   Diagnoses and all orders for this visit:  Lab Results  Component Value Date   CHOL 159 06/28/2017  HDL 61.50 06/28/2017   LDLCALC 76 06/28/2017   TRIG 107.0 06/28/2017   CHOLHDL 3 06/28/2017   Lab Results  Component Value Date   TSH 2.73 06/28/2017   Lab Results  Component Value Date   CREATININE 0.73 06/28/2017   BUN 24 (H) 06/28/2017   NA 139 06/28/2017   K 3.9 06/28/2017   CL 104 06/28/2017   CO2 25 06/28/2017   Lab Results  Component Value Date   WBC 5.8 06/28/2017   HGB 13.6 06/28/2017   HCT 40.7 06/28/2017   MCV 91.3 06/28/2017   PLT 253.0 06/28/2017    Insomnia, unspecified type  Otherwise well controlled.  No changes in current management. Good sleep hygiene also recommended. F/U in 6 months.  Hypothyroidism, unspecified type  No changes in current management, will follow labs done today and will give further recommendations accordingly. F/U in 6 months.  Gastroesophageal reflux disease, esophagitis presence not specified  Improved. GERD precautions to continue. No changes in Omeprazole dose. F/U in 6-12  months.  Major depressive disorder, recurrent episode, moderate (HCC)  Stable. No changes in current management. Instructed about warning signs. F/U in 6 months.  Osteoporosis, unspecified osteoporosis type, unspecified pathological fracture presence  Fall prevention. Regular exercise, 1-2 days of light wt lifting. Ca++ 1200 mg , ideally through diet also recommended. Continue Vit D 2000 U. We dicussed current recommendations in regard to Fosamax treatment, I recommend completing 5 years of continue treatment. We also discussed other treatment options, Prolia and Reclast.   Hyperlipidemia, unspecified hyperlipidemia type  No changes in current management, will follow labs done today and will give further recommendations accordingly. Low fat diet also to continue. F/U in 6-12 months.  Need for vaccination -     Flu vaccine HIGH DOSE PF (Fluzone High dose) -     Pneumococcal conjugate vaccine 13-valent  Healthcare maintenance -     Flu vaccine HIGH DOSE PF (Fluzone High dose) -     Pneumococcal conjugate vaccine 13-valent      She is not sure if she is having shoulder surgery.She will let me know if she decides to pursue it.     -Ms. Deshawnda Hatfield was advised to return sooner than planned today if new concerns arise.       Rashan Patient G. Martinique, MD  [facility]. [place] office.

## 2017-06-27 ENCOUNTER — Ambulatory Visit (INDEPENDENT_AMBULATORY_CARE_PROVIDER_SITE_OTHER): Payer: Medicare Other | Admitting: Family Medicine

## 2017-06-27 ENCOUNTER — Encounter: Payer: Self-pay | Admitting: Family Medicine

## 2017-06-27 VITALS — BP 108/60 | HR 85 | Temp 98.2°F | Resp 12 | Wt 142.2 lb

## 2017-06-27 DIAGNOSIS — Z23 Encounter for immunization: Secondary | ICD-10-CM | POA: Diagnosis not present

## 2017-06-27 DIAGNOSIS — Z0000 Encounter for general adult medical examination without abnormal findings: Secondary | ICD-10-CM

## 2017-06-27 DIAGNOSIS — E039 Hypothyroidism, unspecified: Secondary | ICD-10-CM | POA: Diagnosis not present

## 2017-06-27 DIAGNOSIS — F331 Major depressive disorder, recurrent, moderate: Secondary | ICD-10-CM | POA: Diagnosis not present

## 2017-06-27 DIAGNOSIS — K219 Gastro-esophageal reflux disease without esophagitis: Secondary | ICD-10-CM | POA: Diagnosis not present

## 2017-06-27 DIAGNOSIS — E785 Hyperlipidemia, unspecified: Secondary | ICD-10-CM

## 2017-06-27 DIAGNOSIS — G4700 Insomnia, unspecified: Secondary | ICD-10-CM

## 2017-06-27 DIAGNOSIS — M810 Age-related osteoporosis without current pathological fracture: Secondary | ICD-10-CM | POA: Diagnosis not present

## 2017-06-27 NOTE — Patient Instructions (Signed)
A few things to remember from today's visit:   Hypothyroidism, unspecified type  Gastroesophageal reflux disease, esophagitis presence not specified  Insomnia, unspecified type  Major depressive disorder, recurrent episode, moderate (HCC)  Osteoporosis, unspecified osteoporosis type, unspecified pathological fracture presence  Hyperlipidemia, unspecified hyperlipidemia type  Fosamax to complete 5-6 years. Consider Reclast or Prolia if not better.   Tomorrow fasting labs  Please be sure medication list is accurate. If a new problem present, please set up appointment sooner than planned today.

## 2017-06-28 ENCOUNTER — Encounter: Payer: Self-pay | Admitting: Family Medicine

## 2017-06-28 ENCOUNTER — Other Ambulatory Visit: Payer: Self-pay

## 2017-06-28 ENCOUNTER — Inpatient Hospital Stay: Admit: 2017-06-28 | Payer: Medicare Other | Admitting: Orthopedic Surgery

## 2017-06-28 ENCOUNTER — Ambulatory Visit: Payer: Medicare Other | Admitting: Family Medicine

## 2017-06-28 ENCOUNTER — Other Ambulatory Visit (INDEPENDENT_AMBULATORY_CARE_PROVIDER_SITE_OTHER): Payer: Medicare Other

## 2017-06-28 DIAGNOSIS — E785 Hyperlipidemia, unspecified: Secondary | ICD-10-CM | POA: Diagnosis not present

## 2017-06-28 DIAGNOSIS — E039 Hypothyroidism, unspecified: Secondary | ICD-10-CM

## 2017-06-28 LAB — BASIC METABOLIC PANEL
BUN: 24 mg/dL — ABNORMAL HIGH (ref 6–23)
CALCIUM: 9.3 mg/dL (ref 8.4–10.5)
CHLORIDE: 104 meq/L (ref 96–112)
CO2: 25 mEq/L (ref 19–32)
Creatinine, Ser: 0.73 mg/dL (ref 0.40–1.20)
GFR: 82.56 mL/min (ref >60.00)
GLUCOSE: 82 mg/dL (ref 70–99)
POTASSIUM: 3.9 meq/L (ref 3.5–5.1)
SODIUM: 139 meq/L (ref 135–145)

## 2017-06-28 LAB — LIPID PANEL
CHOL/HDL RATIO: 3
Cholesterol: 159 mg/dL (ref 0–200)
HDL: 61.5 mg/dL (ref >39.00)
LDL CALC: 76 mg/dL (ref 0–99)
NONHDL: 97.02
TRIGLYCERIDES: 107 mg/dL (ref 0.0–149.0)
VLDL: 21.4 mg/dL (ref 0.0–40.0)

## 2017-06-28 LAB — CBC WITH DIFFERENTIAL/PLATELET
BASOS ABS: 0.1 10*3/uL (ref 0.0–0.1)
Basophils Relative: 1.2 % (ref 0.0–3.0)
Eosinophils Absolute: 0.2 10*3/uL (ref 0.0–0.7)
Eosinophils Relative: 3.2 % (ref 0.0–5.0)
HCT: 40.7 % (ref 36.0–46.0)
Hemoglobin: 13.6 g/dL (ref 12.0–15.0)
LYMPHS ABS: 1.3 10*3/uL (ref 0.7–4.0)
Lymphocytes Relative: 22.3 % (ref 12.0–46.0)
MCHC: 33.4 g/dL (ref 30.0–36.0)
MCV: 91.3 fl (ref 78.0–100.0)
MONO ABS: 0.6 10*3/uL (ref 0.1–1.0)
Monocytes Relative: 10.8 % (ref 3.0–12.0)
NEUTROS PCT: 62.5 % (ref 43.0–77.0)
Neutro Abs: 3.6 10*3/uL (ref 1.4–7.7)
Platelets: 253 10*3/uL (ref 150.0–400.0)
RBC: 4.46 Mil/uL (ref 3.87–5.11)
RDW: 13 % (ref 11.5–15.5)
WBC: 5.8 10*3/uL (ref 4.0–10.5)

## 2017-06-28 LAB — TSH: TSH: 2.73 u[IU]/mL (ref 0.35–4.50)

## 2017-06-28 LAB — T4, FREE: Free T4: 0.7 ng/dL (ref 0.60–1.60)

## 2017-06-28 SURGERY — ARTHROPLASTY, SHOULDER, TOTAL, REVERSE
Anesthesia: Choice | Laterality: Right

## 2017-06-29 ENCOUNTER — Encounter: Payer: Self-pay | Admitting: Family Medicine

## 2017-07-10 DIAGNOSIS — R768 Other specified abnormal immunological findings in serum: Secondary | ICD-10-CM | POA: Diagnosis not present

## 2017-07-10 DIAGNOSIS — M25511 Pain in right shoulder: Secondary | ICD-10-CM | POA: Diagnosis not present

## 2017-07-10 DIAGNOSIS — Z6824 Body mass index (BMI) 24.0-24.9, adult: Secondary | ICD-10-CM | POA: Diagnosis not present

## 2017-07-10 DIAGNOSIS — G8929 Other chronic pain: Secondary | ICD-10-CM | POA: Diagnosis not present

## 2017-07-10 DIAGNOSIS — Z7952 Long term (current) use of systemic steroids: Secondary | ICD-10-CM | POA: Diagnosis not present

## 2017-07-10 DIAGNOSIS — R928 Other abnormal and inconclusive findings on diagnostic imaging of breast: Secondary | ICD-10-CM | POA: Diagnosis not present

## 2017-07-10 DIAGNOSIS — M5136 Other intervertebral disc degeneration, lumbar region: Secondary | ICD-10-CM | POA: Diagnosis not present

## 2017-07-10 DIAGNOSIS — M316 Other giant cell arteritis: Secondary | ICD-10-CM | POA: Diagnosis not present

## 2017-07-10 DIAGNOSIS — M8589 Other specified disorders of bone density and structure, multiple sites: Secondary | ICD-10-CM | POA: Diagnosis not present

## 2017-08-03 DIAGNOSIS — M25532 Pain in left wrist: Secondary | ICD-10-CM | POA: Diagnosis not present

## 2017-08-03 DIAGNOSIS — S52622A Torus fracture of lower end of left ulna, initial encounter for closed fracture: Secondary | ICD-10-CM | POA: Diagnosis not present

## 2017-08-10 DIAGNOSIS — Z9889 Other specified postprocedural states: Secondary | ICD-10-CM | POA: Diagnosis not present

## 2017-08-24 ENCOUNTER — Other Ambulatory Visit: Payer: Self-pay | Admitting: Family Medicine

## 2017-08-24 DIAGNOSIS — E039 Hypothyroidism, unspecified: Secondary | ICD-10-CM

## 2017-09-07 DIAGNOSIS — S52622A Torus fracture of lower end of left ulna, initial encounter for closed fracture: Secondary | ICD-10-CM | POA: Diagnosis not present

## 2017-09-11 DIAGNOSIS — S52622D Torus fracture of lower end of left ulna, subsequent encounter for fracture with routine healing: Secondary | ICD-10-CM | POA: Diagnosis not present

## 2017-09-13 DIAGNOSIS — S52622D Torus fracture of lower end of left ulna, subsequent encounter for fracture with routine healing: Secondary | ICD-10-CM | POA: Diagnosis not present

## 2017-09-19 DIAGNOSIS — S52622D Torus fracture of lower end of left ulna, subsequent encounter for fracture with routine healing: Secondary | ICD-10-CM | POA: Diagnosis not present

## 2017-10-09 DIAGNOSIS — M069 Rheumatoid arthritis, unspecified: Secondary | ICD-10-CM

## 2017-10-09 HISTORY — DX: Rheumatoid arthritis, unspecified: M06.9

## 2017-10-17 DIAGNOSIS — M5136 Other intervertebral disc degeneration, lumbar region: Secondary | ICD-10-CM | POA: Diagnosis not present

## 2017-10-17 DIAGNOSIS — Z6824 Body mass index (BMI) 24.0-24.9, adult: Secondary | ICD-10-CM | POA: Diagnosis not present

## 2017-10-17 DIAGNOSIS — M0579 Rheumatoid arthritis with rheumatoid factor of multiple sites without organ or systems involvement: Secondary | ICD-10-CM | POA: Diagnosis not present

## 2017-10-17 DIAGNOSIS — Z7952 Long term (current) use of systemic steroids: Secondary | ICD-10-CM | POA: Diagnosis not present

## 2017-10-17 DIAGNOSIS — G8929 Other chronic pain: Secondary | ICD-10-CM | POA: Diagnosis not present

## 2017-10-17 DIAGNOSIS — M316 Other giant cell arteritis: Secondary | ICD-10-CM | POA: Diagnosis not present

## 2017-10-17 DIAGNOSIS — M25511 Pain in right shoulder: Secondary | ICD-10-CM | POA: Diagnosis not present

## 2017-10-17 DIAGNOSIS — R928 Other abnormal and inconclusive findings on diagnostic imaging of breast: Secondary | ICD-10-CM | POA: Diagnosis not present

## 2017-10-17 DIAGNOSIS — R768 Other specified abnormal immunological findings in serum: Secondary | ICD-10-CM | POA: Diagnosis not present

## 2017-10-17 DIAGNOSIS — M8589 Other specified disorders of bone density and structure, multiple sites: Secondary | ICD-10-CM | POA: Diagnosis not present

## 2017-10-19 DIAGNOSIS — M25532 Pain in left wrist: Secondary | ICD-10-CM | POA: Diagnosis not present

## 2017-10-19 DIAGNOSIS — M19211 Secondary osteoarthritis, right shoulder: Secondary | ICD-10-CM | POA: Diagnosis not present

## 2017-10-22 DIAGNOSIS — M19211 Secondary osteoarthritis, right shoulder: Secondary | ICD-10-CM | POA: Diagnosis not present

## 2017-10-24 DIAGNOSIS — M25511 Pain in right shoulder: Secondary | ICD-10-CM | POA: Diagnosis not present

## 2017-10-24 DIAGNOSIS — M8589 Other specified disorders of bone density and structure, multiple sites: Secondary | ICD-10-CM | POA: Diagnosis not present

## 2017-10-24 DIAGNOSIS — M5136 Other intervertebral disc degeneration, lumbar region: Secondary | ICD-10-CM | POA: Diagnosis not present

## 2017-10-24 DIAGNOSIS — Z6824 Body mass index (BMI) 24.0-24.9, adult: Secondary | ICD-10-CM | POA: Diagnosis not present

## 2017-10-24 DIAGNOSIS — G8929 Other chronic pain: Secondary | ICD-10-CM | POA: Diagnosis not present

## 2017-10-24 DIAGNOSIS — Z7952 Long term (current) use of systemic steroids: Secondary | ICD-10-CM | POA: Diagnosis not present

## 2017-10-24 DIAGNOSIS — R768 Other specified abnormal immunological findings in serum: Secondary | ICD-10-CM | POA: Diagnosis not present

## 2017-10-24 DIAGNOSIS — M316 Other giant cell arteritis: Secondary | ICD-10-CM | POA: Diagnosis not present

## 2017-10-24 DIAGNOSIS — M0579 Rheumatoid arthritis with rheumatoid factor of multiple sites without organ or systems involvement: Secondary | ICD-10-CM | POA: Diagnosis not present

## 2017-10-24 DIAGNOSIS — M25432 Effusion, left wrist: Secondary | ICD-10-CM | POA: Diagnosis not present

## 2017-10-24 DIAGNOSIS — R928 Other abnormal and inconclusive findings on diagnostic imaging of breast: Secondary | ICD-10-CM | POA: Diagnosis not present

## 2017-10-26 ENCOUNTER — Telehealth: Payer: Self-pay | Admitting: Family Medicine

## 2017-10-26 NOTE — Telephone Encounter (Signed)
I called and spoke with Ms Wrubel. She has decided to pursue right reverse total shoulder surgery. She is having a lot of pain.  EKG 07/2017 otherwise negative. Cardiology evaluation on 01/03/2017 due to exertional dyspnea, which resolved when she discontinued Prednisone. Echo: LVEF 55-60%.  Wall motion was normal, no regional wall motion abnormalities, and normal left ventricular diastolic function.  She had left breast surgery in 1992 and hysterectomy in 1986.  She denies any history of complications during or after surgery. No history of DM 2, CAD, or hypertension. She has not had any chest pain, dyspnea, palpitations, or diaphoresis with moderate exertion.  She is currently on Meloxicam 7.5 mg twice daily.  Plan: She understands the risk of surgery but she is certain it will improve quality of life and she would like to proceed with procedure. She knows to discontinue NSAIDs 5-7 days before surgery. We will call Dr. Larkin Ina Chandler's office to inform him that she is cleared to proceed with surgery. We will also fax form.  Jalina Blowers Martinique, MD

## 2017-10-26 NOTE — Telephone Encounter (Signed)
Copied from [place] [id number]. Topic: Inquiry >> Oct 26, 2017  9:54 AM Patrice Paradise wrote: Reason for CRM: Patient calling in reference to her clearance for her surgery. Would like for Dr. Doug Sou to give her a call @ [phone].

## 2017-10-30 ENCOUNTER — Other Ambulatory Visit: Payer: Self-pay | Admitting: Orthopedic Surgery

## 2017-10-30 NOTE — Telephone Encounter (Signed)
Surgical clearance form sent back to [facility].

## 2017-11-01 ENCOUNTER — Other Ambulatory Visit: Payer: Self-pay | Admitting: Family Medicine

## 2017-11-01 DIAGNOSIS — E785 Hyperlipidemia, unspecified: Secondary | ICD-10-CM

## 2017-11-14 NOTE — Pre-Procedure Instructions (Signed)
Maddilynn Gohman  11/14/2017      [facility] [address], [place] [address] Phone: [phone] Fax: [phone]    Your procedure is scheduled on February 14  Report to [place] at [place] A.M.  Call this number if you have problems the morning of surgery:  [phone]   Remember:  Do not eat food or drink liquids after midnight.  Continue all medications as directed by your physician except follow these medication instructions before surgery below   Take these medicines the morning of surgery with A SIP OF WATER  acetaminophen (TYLENOL)  cetirizine (ZYRTEC) levothyroxine (SYNTHROID, LEVOTHROID) omeprazole (PRILOSEC)  sertraline (ZOLOFT)  7 days prior to surgery STOP taking any Aspirin(unless otherwise instructed by your surgeon), Aleve, Naproxen, Ibuprofen, Motrin, Advil, Goody's, BC's, all herbal medications, fish oil, and all vitamins    Do not wear jewelry, make-up or nail polish.  Do not wear lotions, powders, or perfumes, or deodorant.  Do not shave 48 hours prior to surgery.  Do not bring valuables to the hospital.  [facility] is not responsible for any belongings or valuables.  Contacts, dentures or bridgework may not be worn into surgery.  Leave your suitcase in the car.  After surgery it may be brought to your room.  For patients admitted to the hospital, discharge time will be determined by your treatment team.  Patients discharged the day of surgery will not be allowed to drive home.    Special instructions:   [place]- Preparing For Surgery  Before surgery, you can play an important role. Because skin is not sterile, your skin needs to be as free of germs as possible. You can reduce the number of germs on your skin by washing with CHG (chlorahexidine gluconate) Soap before surgery.  CHG is an antiseptic cleaner which kills germs and bonds with the skin to  continue killing germs even after washing.  Please do not use if you have an allergy to CHG or antibacterial soaps. If your skin becomes reddened/irritated stop using the CHG.  Do not shave (including legs and underarms) for at least 48 hours prior to first CHG shower. It is OK to shave your face.  Please follow these instructions carefully.   1. Shower the NIGHT BEFORE SURGERY and the MORNING OF SURGERY with CHG.   2. If you chose to wash your hair, wash your hair first as usual with your normal shampoo.  3. After you shampoo, rinse your hair and body thoroughly to remove the shampoo.  4. Use CHG as you would any other liquid soap. You can apply CHG directly to the skin and wash gently with a scrungie or a clean washcloth.   5. Apply the CHG Soap to your body ONLY FROM THE NECK DOWN.  Do not use on open wounds or open sores. Avoid contact with your eyes, ears, mouth and genitals (private parts). Wash Face and genitals (private parts)  with your normal soap.  6. Wash thoroughly, paying special attention to the area where your surgery will be performed.  7. Thoroughly rinse your body with warm water from the neck down.  8. DO NOT shower/wash with your normal soap after using and rinsing off the CHG Soap.  9. Pat yourself dry with a CLEAN TOWEL.  10. Wear CLEAN PAJAMAS to bed the night before surgery, wear comfortable clothes the morning of surgery  11. Place CLEAN  SHEETS on your bed the night of your first shower and DO NOT SLEEP WITH PETS.    Day of Surgery: Do not apply any deodorants/lotions. Please wear clean clothes to the hospital/surgery center.      Please read over the following fact sheets that you were given.

## 2017-11-15 ENCOUNTER — Encounter (HOSPITAL_COMMUNITY)
Admission: RE | Admit: 2017-11-15 | Discharge: 2017-11-15 | Disposition: A | Discharge status: Home / Self Care | Payer: Medicare Other | Source: Ambulatory Visit | Attending: Orthopedic Surgery | Admitting: Orthopedic Surgery

## 2017-11-15 ENCOUNTER — Other Ambulatory Visit: Payer: Self-pay

## 2017-11-15 ENCOUNTER — Encounter (HOSPITAL_COMMUNITY): Payer: Self-pay

## 2017-11-15 DIAGNOSIS — K219 Gastro-esophageal reflux disease without esophagitis: Secondary | ICD-10-CM | POA: Diagnosis not present

## 2017-11-15 DIAGNOSIS — E785 Hyperlipidemia, unspecified: Secondary | ICD-10-CM | POA: Diagnosis not present

## 2017-11-15 DIAGNOSIS — F411 Generalized anxiety disorder: Secondary | ICD-10-CM | POA: Insufficient documentation

## 2017-11-15 DIAGNOSIS — F329 Major depressive disorder, single episode, unspecified: Secondary | ICD-10-CM | POA: Insufficient documentation

## 2017-11-15 DIAGNOSIS — E039 Hypothyroidism, unspecified: Secondary | ICD-10-CM | POA: Insufficient documentation

## 2017-11-15 DIAGNOSIS — Z87891 Personal history of nicotine dependence: Secondary | ICD-10-CM | POA: Insufficient documentation

## 2017-11-15 DIAGNOSIS — M75101 Unspecified rotator cuff tear or rupture of right shoulder, not specified as traumatic: Secondary | ICD-10-CM | POA: Diagnosis not present

## 2017-11-15 DIAGNOSIS — Z01818 Encounter for other preprocedural examination: Secondary | ICD-10-CM

## 2017-11-15 DIAGNOSIS — Z79890 Hormone replacement therapy (postmenopausal): Secondary | ICD-10-CM | POA: Diagnosis not present

## 2017-11-15 DIAGNOSIS — Z79899 Other long term (current) drug therapy: Secondary | ICD-10-CM | POA: Insufficient documentation

## 2017-11-15 DIAGNOSIS — G4700 Insomnia, unspecified: Secondary | ICD-10-CM | POA: Diagnosis not present

## 2017-11-15 LAB — CBC WITH DIFFERENTIAL/PLATELET
BASOS ABS: 0 10*3/uL (ref 0.0–0.1)
BASOS PCT: 0 %
EOS ABS: 0.2 10*3/uL (ref 0.0–0.7)
EOS PCT: 2 %
HCT: 39.4 % (ref 36.0–46.0)
Hemoglobin: 13.5 g/dL (ref 12.0–15.0)
Lymphocytes Relative: 38 %
Lymphs Abs: 2.8 10*3/uL (ref 0.7–4.0)
MCH: 30.9 pg (ref 26.0–34.0)
MCHC: 34.3 g/dL (ref 30.0–36.0)
MCV: 90.2 fL (ref 78.0–100.0)
MONO ABS: 0.5 10*3/uL (ref 0.1–1.0)
MONOS PCT: 7 %
NEUTROS ABS: 3.9 10*3/uL (ref 1.7–7.7)
Neutrophils Relative %: 53 %
PLATELETS: 242 10*3/uL (ref 150–400)
RBC: 4.37 MIL/uL (ref 3.87–5.11)
RDW: 13.3 % (ref 11.5–15.5)
WBC: 7.4 10*3/uL (ref 4.0–10.5)

## 2017-11-15 LAB — URINALYSIS, ROUTINE W REFLEX MICROSCOPIC
BILIRUBIN URINE: NEGATIVE
Glucose, UA: NEGATIVE mg/dL
Hgb urine dipstick: NEGATIVE
KETONES UR: NEGATIVE mg/dL
Leukocytes, UA: NEGATIVE
NITRITE: NEGATIVE
PH: 5 (ref 5.0–8.0)
Protein, ur: NEGATIVE mg/dL
SPECIFIC GRAVITY, URINE: 1.021 (ref 1.005–1.030)

## 2017-11-15 LAB — COMPREHENSIVE METABOLIC PANEL
ALBUMIN: 4.2 g/dL (ref 3.5–5.0)
ALT: 21 U/L (ref 14–54)
AST: 23 U/L (ref 15–41)
Alkaline Phosphatase: 67 U/L (ref 38–126)
Anion gap: 10 (ref 5–15)
BILIRUBIN TOTAL: 0.7 mg/dL (ref 0.3–1.2)
BUN: 25 mg/dL — AB (ref 6–20)
CHLORIDE: 103 mmol/L (ref 101–111)
CO2: 23 mmol/L (ref 22–32)
Calcium: 9.2 mg/dL (ref 8.9–10.3)
Creatinine, Ser: 1.01 mg/dL — ABNORMAL HIGH (ref 0.44–1.00)
GFR calc Af Amer: >60 mL/min (ref >60)
GFR calc non Af Amer: 53 mL/min — ABNORMAL LOW (ref >60)
GLUCOSE: 93 mg/dL (ref 65–99)
POTASSIUM: 4.1 mmol/L (ref 3.5–5.1)
SODIUM: 136 mmol/L (ref 135–145)
TOTAL PROTEIN: 6.6 g/dL (ref 6.5–8.1)

## 2017-11-15 LAB — TYPE AND SCREEN
ABO/RH(D): B POS
ANTIBODY SCREEN: NEGATIVE

## 2017-11-15 LAB — PROTIME-INR
INR: 0.98
Prothrombin Time: 12.9 seconds (ref 11.4–15.2)

## 2017-11-15 LAB — SURGICAL PCR SCREEN
MRSA, PCR: NEGATIVE
Staphylococcus aureus: NEGATIVE

## 2017-11-15 LAB — ABO/RH: ABO/RH(D): B POS

## 2017-11-15 LAB — APTT: APTT: 31 s (ref 24–36)

## 2017-11-15 NOTE — Progress Notes (Signed)
PCP - [name] Cardiologist - [place]  Chest x-ray - 02/13/17 EKG - not needed Stress Test - denies ECHO - 2018 Cardiac Cath - denies      Anesthesia review: NO  Patient denies shortness of breath, fever, cough and chest pain at PAT appointment   Patient verbalized understanding of instructions that were given to them at the PAT appointment. Patient was also instructed that they will need to review over the PAT instructions again at home before surgery.

## 2017-11-16 ENCOUNTER — Other Ambulatory Visit: Payer: Self-pay | Admitting: Orthopedic Surgery

## 2017-11-21 MED ORDER — CEFAZOLIN SODIUM-DEXTROSE 2-4 GM/100ML-% IV SOLN
2.0000 g | INTRAVENOUS | Status: AC
  Administered 2017-11-22: 2 g via INTRAVENOUS
  Filled 2017-11-21: qty 100

## 2017-11-22 ENCOUNTER — Inpatient Hospital Stay (HOSPITAL_COMMUNITY)
Admission: RE | Admit: 2017-11-22 | Discharge: 2017-11-24 | DRG: 483 | Disposition: A | Discharge status: Home / Self Care | Payer: Medicare Other | Source: Ambulatory Visit | Attending: Orthopedic Surgery | Admitting: Orthopedic Surgery

## 2017-11-22 ENCOUNTER — Inpatient Hospital Stay (HOSPITAL_COMMUNITY): Payer: Medicare Other | Admitting: Certified Registered"

## 2017-11-22 ENCOUNTER — Encounter (HOSPITAL_COMMUNITY)
Admission: RE | Disposition: A | Discharge status: Home / Self Care | Payer: Self-pay | Source: Ambulatory Visit | Attending: Orthopedic Surgery

## 2017-11-22 ENCOUNTER — Encounter (HOSPITAL_COMMUNITY): Payer: Self-pay | Admitting: *Deleted

## 2017-11-22 ENCOUNTER — Other Ambulatory Visit: Payer: Self-pay

## 2017-11-22 ENCOUNTER — Inpatient Hospital Stay (HOSPITAL_COMMUNITY): Payer: Medicare Other

## 2017-11-22 DIAGNOSIS — F419 Anxiety disorder, unspecified: Secondary | ICD-10-CM | POA: Diagnosis present

## 2017-11-22 DIAGNOSIS — Z90710 Acquired absence of both cervix and uterus: Secondary | ICD-10-CM | POA: Diagnosis not present

## 2017-11-22 DIAGNOSIS — Z96611 Presence of right artificial shoulder joint: Secondary | ICD-10-CM | POA: Diagnosis not present

## 2017-11-22 DIAGNOSIS — M19011 Primary osteoarthritis, right shoulder: Secondary | ICD-10-CM | POA: Diagnosis present

## 2017-11-22 DIAGNOSIS — E785 Hyperlipidemia, unspecified: Secondary | ICD-10-CM | POA: Diagnosis present

## 2017-11-22 DIAGNOSIS — Z803 Family history of malignant neoplasm of breast: Secondary | ICD-10-CM | POA: Diagnosis not present

## 2017-11-22 DIAGNOSIS — Z87891 Personal history of nicotine dependence: Secondary | ICD-10-CM

## 2017-11-22 DIAGNOSIS — K219 Gastro-esophageal reflux disease without esophagitis: Secondary | ICD-10-CM | POA: Diagnosis present

## 2017-11-22 DIAGNOSIS — I959 Hypotension, unspecified: Secondary | ICD-10-CM | POA: Diagnosis not present

## 2017-11-22 DIAGNOSIS — G8918 Other acute postprocedural pain: Secondary | ICD-10-CM | POA: Diagnosis not present

## 2017-11-22 DIAGNOSIS — Z887 Allergy status to serum and vaccine status: Secondary | ICD-10-CM

## 2017-11-22 DIAGNOSIS — Z7983 Long term (current) use of bisphosphonates: Secondary | ICD-10-CM

## 2017-11-22 DIAGNOSIS — M810 Age-related osteoporosis without current pathological fracture: Secondary | ICD-10-CM | POA: Diagnosis present

## 2017-11-22 DIAGNOSIS — M75101 Unspecified rotator cuff tear or rupture of right shoulder, not specified as traumatic: Secondary | ICD-10-CM | POA: Diagnosis present

## 2017-11-22 DIAGNOSIS — F329 Major depressive disorder, single episode, unspecified: Secondary | ICD-10-CM | POA: Diagnosis present

## 2017-11-22 DIAGNOSIS — Z471 Aftercare following joint replacement surgery: Secondary | ICD-10-CM | POA: Diagnosis not present

## 2017-11-22 DIAGNOSIS — E039 Hypothyroidism, unspecified: Secondary | ICD-10-CM | POA: Diagnosis present

## 2017-11-22 DIAGNOSIS — Z79890 Hormone replacement therapy (postmenopausal): Secondary | ICD-10-CM | POA: Diagnosis not present

## 2017-11-22 DIAGNOSIS — F411 Generalized anxiety disorder: Secondary | ICD-10-CM | POA: Diagnosis not present

## 2017-11-22 HISTORY — PX: TOTAL SHOULDER ARTHROPLASTY: SHX126

## 2017-11-22 SURGERY — ARTHROPLASTY, SHOULDER, TOTAL
Anesthesia: Regional | Site: Shoulder | Laterality: Right

## 2017-11-22 MED ORDER — ROCURONIUM BROMIDE 10 MG/ML (PF) SYRINGE
PREFILLED_SYRINGE | INTRAVENOUS | Status: DC | PRN
  Administered 2017-11-22: 30 mg via INTRAVENOUS
  Administered 2017-11-22: 20 mg via INTRAVENOUS

## 2017-11-22 MED ORDER — SERTRALINE HCL 100 MG PO TABS
200.0000 mg | ORAL_TABLET | Freq: Every day | ORAL | Status: DC
  Administered 2017-11-23 – 2017-11-24 (×2): 200 mg via ORAL
  Filled 2017-11-22 (×2): qty 2

## 2017-11-22 MED ORDER — POVIDONE-IODINE 7.5 % EX SOLN
Freq: Once | CUTANEOUS | Status: DC
  Filled 2017-11-22: qty 118

## 2017-11-22 MED ORDER — DOCUSATE SODIUM 100 MG PO CAPS
100.0000 mg | ORAL_CAPSULE | Freq: Two times a day (BID) | ORAL | Status: DC
  Administered 2017-11-22 – 2017-11-24 (×4): 100 mg via ORAL
  Filled 2017-11-22 (×4): qty 1

## 2017-11-22 MED ORDER — OXYCODONE HCL 5 MG PO TABS
10.0000 mg | ORAL_TABLET | ORAL | Status: DC | PRN
  Administered 2017-11-23 (×2): 10 mg via ORAL
  Filled 2017-11-22 (×2): qty 2

## 2017-11-22 MED ORDER — PROPOFOL 10 MG/ML IV BOLUS
INTRAVENOUS | Status: AC
  Filled 2017-11-22: qty 20

## 2017-11-22 MED ORDER — LACTATED RINGERS IV SOLN
INTRAVENOUS | Status: DC
  Administered 2017-11-22 (×2): via INTRAVENOUS

## 2017-11-22 MED ORDER — METOCLOPRAMIDE HCL 5 MG PO TABS
5.0000 mg | ORAL_TABLET | Freq: Three times a day (TID) | ORAL | Status: DC | PRN
  Administered 2017-11-24: 10 mg via ORAL
  Filled 2017-11-22: qty 2

## 2017-11-22 MED ORDER — FLEET ENEMA 7-19 GM/118ML RE ENEM: 1.0000 | ENEMA | Freq: Once | RECTAL | Status: DC | PRN

## 2017-11-22 MED ORDER — LEVOTHYROXINE SODIUM 112 MCG PO TABS
112.0000 ug | ORAL_TABLET | Freq: Every day | ORAL | Status: DC
  Administered 2017-11-23 – 2017-11-24 (×2): 112 ug via ORAL
  Filled 2017-11-22 (×2): qty 1

## 2017-11-22 MED ORDER — PANTOPRAZOLE SODIUM 40 MG PO TBEC
40.0000 mg | DELAYED_RELEASE_TABLET | Freq: Every day | ORAL | Status: DC
  Administered 2017-11-23 – 2017-11-24 (×2): 40 mg via ORAL
  Filled 2017-11-22 (×2): qty 1

## 2017-11-22 MED ORDER — ONDANSETRON HCL 4 MG/2ML IJ SOLN: 4.0000 mg | Freq: Once | INTRAMUSCULAR | Status: DC | PRN

## 2017-11-22 MED ORDER — CEFAZOLIN SODIUM-DEXTROSE 1-4 GM/50ML-% IV SOLN
1.0000 g | Freq: Four times a day (QID) | INTRAVENOUS | Status: AC
  Administered 2017-11-22 – 2017-11-23 (×2): 1 g via INTRAVENOUS
  Filled 2017-11-22 (×3): qty 50

## 2017-11-22 MED ORDER — MORPHINE SULFATE (PF) 2 MG/ML IV SOLN
1.0000 mg | INTRAVENOUS | Status: DC | PRN
  Administered 2017-11-23 – 2017-11-24 (×3): 2 mg via INTRAVENOUS
  Filled 2017-11-22 (×3): qty 1

## 2017-11-22 MED ORDER — CLONIDINE HCL (ANALGESIA) 100 MCG/ML EP SOLN
EPIDURAL | Status: DC | PRN
  Administered 2017-11-22: 100 ug

## 2017-11-22 MED ORDER — MENTHOL 3 MG MT LOZG: 1.0000 | LOZENGE | OROMUCOSAL | Status: DC | PRN

## 2017-11-22 MED ORDER — 0.9 % SODIUM CHLORIDE (POUR BTL) OPTIME
TOPICAL | Status: DC | PRN
  Administered 2017-11-22: 1000 mL

## 2017-11-22 MED ORDER — ONDANSETRON HCL 4 MG/2ML IJ SOLN
INTRAMUSCULAR | Status: DC | PRN
  Administered 2017-11-22: 4 mg via INTRAVENOUS

## 2017-11-22 MED ORDER — LIDOCAINE 2% (20 MG/ML) 5 ML SYRINGE
INTRAMUSCULAR | Status: DC | PRN
  Administered 2017-11-22: 40 mg via INTRAVENOUS

## 2017-11-22 MED ORDER — TEMAZEPAM 15 MG PO CAPS
15.0000 mg | ORAL_CAPSULE | Freq: Every evening | ORAL | Status: DC | PRN
  Administered 2017-11-22 – 2017-11-23 (×2): 15 mg via ORAL
  Filled 2017-11-22 (×2): qty 1

## 2017-11-22 MED ORDER — FENTANYL CITRATE (PF) 100 MCG/2ML IJ SOLN
25.0000 ug | Freq: Once | INTRAMUSCULAR | Status: AC
  Administered 2017-11-22: 25 ug via INTRAVENOUS

## 2017-11-22 MED ORDER — ACETAMINOPHEN 650 MG RE SUPP: 650.0000 mg | RECTAL | Status: DC | PRN

## 2017-11-22 MED ORDER — ATORVASTATIN CALCIUM 10 MG PO TABS
10.0000 mg | ORAL_TABLET | Freq: Every day | ORAL | Status: DC
  Administered 2017-11-22 – 2017-11-23 (×2): 10 mg via ORAL
  Filled 2017-11-22 (×2): qty 1

## 2017-11-22 MED ORDER — SUGAMMADEX SODIUM 200 MG/2ML IV SOLN
INTRAVENOUS | Status: DC | PRN
  Administered 2017-11-22: 125.2 mg via INTRAVENOUS

## 2017-11-22 MED ORDER — ROPIVACAINE HCL 7.5 MG/ML IJ SOLN
INTRAMUSCULAR | Status: DC | PRN
  Administered 2017-11-22: 20 mL via PERINEURAL

## 2017-11-22 MED ORDER — DEXAMETHASONE SODIUM PHOSPHATE 10 MG/ML IJ SOLN
INTRAMUSCULAR | Status: DC | PRN
  Administered 2017-11-22: 10 mg

## 2017-11-22 MED ORDER — ONDANSETRON HCL 4 MG/2ML IJ SOLN
INTRAMUSCULAR | Status: AC
  Filled 2017-11-22: qty 2

## 2017-11-22 MED ORDER — MIDAZOLAM HCL 2 MG/2ML IJ SOLN
1.0000 mg | Freq: Once | INTRAMUSCULAR | Status: AC
  Administered 2017-11-22: 1 mg via INTRAVENOUS

## 2017-11-22 MED ORDER — SODIUM CHLORIDE 0.9 % IV SOLN
INTRAVENOUS | Status: AC
  Administered 2017-11-22: 17:00:00 via INTRAVENOUS

## 2017-11-22 MED ORDER — LIDOCAINE 2% (20 MG/ML) 5 ML SYRINGE
INTRAMUSCULAR | Status: AC
  Filled 2017-11-22: qty 5

## 2017-11-22 MED ORDER — PHENOL 1.4 % MT LIQD: 1.0000 | OROMUCOSAL | Status: DC | PRN

## 2017-11-22 MED ORDER — PHENYLEPHRINE 40 MCG/ML (10ML) SYRINGE FOR IV PUSH (FOR BLOOD PRESSURE SUPPORT)
PREFILLED_SYRINGE | INTRAVENOUS | Status: DC | PRN
  Administered 2017-11-22: 80 ug via INTRAVENOUS

## 2017-11-22 MED ORDER — ASPIRIN EC 325 MG PO TBEC
325.0000 mg | DELAYED_RELEASE_TABLET | Freq: Two times a day (BID) | ORAL | Status: DC
  Administered 2017-11-22 – 2017-11-24 (×4): 325 mg via ORAL
  Filled 2017-11-22 (×4): qty 1

## 2017-11-22 MED ORDER — DIPHENHYDRAMINE HCL 12.5 MG/5ML PO ELIX: 12.5000 mg | ORAL_SOLUTION | ORAL | Status: DC | PRN

## 2017-11-22 MED ORDER — ACETAMINOPHEN 500 MG PO TABS
1000.0000 mg | ORAL_TABLET | Freq: Four times a day (QID) | ORAL | Status: AC
  Administered 2017-11-22 – 2017-11-23 (×3): 1000 mg via ORAL
  Filled 2017-11-22 (×5): qty 2

## 2017-11-22 MED ORDER — ONDANSETRON HCL 4 MG/2ML IJ SOLN
4.0000 mg | Freq: Four times a day (QID) | INTRAMUSCULAR | Status: DC | PRN
Start: 2017-11-22 — End: 2017-11-24
  Administered 2017-11-23: 4 mg via INTRAVENOUS
  Filled 2017-11-22: qty 2

## 2017-11-22 MED ORDER — FENTANYL CITRATE (PF) 250 MCG/5ML IJ SOLN
INTRAMUSCULAR | Status: AC
  Filled 2017-11-22: qty 5

## 2017-11-22 MED ORDER — SODIUM CHLORIDE 0.9 % IR SOLN
Status: DC | PRN
  Administered 2017-11-22: 3000 mL

## 2017-11-22 MED ORDER — ACETAMINOPHEN 325 MG PO TABS
650.0000 mg | ORAL_TABLET | ORAL | Status: DC | PRN
  Administered 2017-11-23 – 2017-11-24 (×2): 650 mg via ORAL
  Filled 2017-11-22 (×2): qty 2

## 2017-11-22 MED ORDER — ONDANSETRON HCL 4 MG PO TABS
4.0000 mg | ORAL_TABLET | Freq: Four times a day (QID) | ORAL | Status: DC | PRN
  Administered 2017-11-23 – 2017-11-24 (×3): 4 mg via ORAL
  Filled 2017-11-22 (×3): qty 1

## 2017-11-22 MED ORDER — MIDAZOLAM HCL 2 MG/2ML IJ SOLN
INTRAMUSCULAR | Status: AC
  Administered 2017-11-22: 1 mg via INTRAVENOUS
  Filled 2017-11-22: qty 2

## 2017-11-22 MED ORDER — FENTANYL CITRATE (PF) 100 MCG/2ML IJ SOLN
INTRAMUSCULAR | Status: AC
  Administered 2017-11-22: 25 ug via INTRAVENOUS
  Filled 2017-11-22: qty 2

## 2017-11-22 MED ORDER — FENTANYL CITRATE (PF) 100 MCG/2ML IJ SOLN: 25.0000 ug | INTRAMUSCULAR | Status: DC | PRN

## 2017-11-22 MED ORDER — PHENYLEPHRINE HCL 10 MG/ML IJ SOLN
INTRAVENOUS | Status: DC | PRN
  Administered 2017-11-22: 50 ug/min via INTRAVENOUS

## 2017-11-22 MED ORDER — OXYCODONE HCL 5 MG PO TABS
5.0000 mg | ORAL_TABLET | ORAL | Status: DC | PRN
  Administered 2017-11-22 – 2017-11-23 (×2): 5 mg via ORAL
  Filled 2017-11-22 (×2): qty 1

## 2017-11-22 MED ORDER — SUGAMMADEX SODIUM 200 MG/2ML IV SOLN
INTRAVENOUS | Status: AC
  Filled 2017-11-22: qty 2

## 2017-11-22 MED ORDER — PHENYLEPHRINE 40 MCG/ML (10ML) SYRINGE FOR IV PUSH (FOR BLOOD PRESSURE SUPPORT)
PREFILLED_SYRINGE | INTRAVENOUS | Status: AC
  Filled 2017-11-22: qty 10

## 2017-11-22 MED ORDER — BISACODYL 5 MG PO TBEC: 5.0000 mg | DELAYED_RELEASE_TABLET | Freq: Every day | ORAL | Status: DC | PRN

## 2017-11-22 MED ORDER — POLYETHYLENE GLYCOL 3350 17 G PO PACK: 17.0000 g | PACK | Freq: Every day | ORAL | Status: DC | PRN

## 2017-11-22 MED ORDER — METOCLOPRAMIDE HCL 5 MG/ML IJ SOLN
5.0000 mg | Freq: Three times a day (TID) | INTRAMUSCULAR | Status: DC | PRN
  Administered 2017-11-23: 10 mg via INTRAVENOUS
  Filled 2017-11-22: qty 2

## 2017-11-22 MED ORDER — PROPOFOL 10 MG/ML IV BOLUS
INTRAVENOUS | Status: DC | PRN
  Administered 2017-11-22: 80 mg via INTRAVENOUS

## 2017-11-22 SURGICAL SUPPLY — 72 items
BASEPLATE P2 COATD GLND 6.5X30 (Shoulder) ×1 IMPLANT
BIT DRILL 5/64X5 DISP (BIT) ×2 IMPLANT
BLADE SAW SAG 73X25 THK (BLADE) ×1
BLADE SAW SGTL 73X25 THK (BLADE) ×1 IMPLANT
BLADE SURG 15 STRL LF DISP TIS (BLADE) ×1 IMPLANT
BLADE SURG 15 STRL SS (BLADE) ×1
CHLORAPREP W/TINT 26ML (MISCELLANEOUS) ×2 IMPLANT
CLOSURE STERI-STRIP 1/4X4 (GAUZE/BANDAGES/DRESSINGS) ×2 IMPLANT
COVER SURGICAL LIGHT HANDLE (MISCELLANEOUS) ×2 IMPLANT
DRAPE INCISE IOBAN 66X45 STRL (DRAPES) ×2 IMPLANT
DRAPE ORTHO SPLIT 77X108 STRL (DRAPES) ×2
DRAPE SURG 17X23 STRL (DRAPES) ×2 IMPLANT
DRAPE SURG ORHT 6 SPLT 77X108 (DRAPES) ×2 IMPLANT
DRAPE U-SHAPE 47X51 STRL (DRAPES) ×2 IMPLANT
DRSG AQUACEL AG ADV 3.5X 6 (GAUZE/BANDAGES/DRESSINGS) ×2 IMPLANT
DRSG AQUACEL AG ADV 3.5X10 (GAUZE/BANDAGES/DRESSINGS) IMPLANT
ELECT BLADE 4.0 EZ CLEAN MEGAD (MISCELLANEOUS)
ELECT REM PT RETURN 9FT ADLT (ELECTROSURGICAL) ×2
ELECTRODE BLDE 4.0 EZ CLN MEGD (MISCELLANEOUS) IMPLANT
ELECTRODE REM PT RTRN 9FT ADLT (ELECTROSURGICAL) ×1 IMPLANT
GLOVE BIO SURGEON STRL SZ7 (GLOVE) ×2 IMPLANT
GLOVE BIO SURGEON STRL SZ7.5 (GLOVE) ×2 IMPLANT
GLOVE BIOGEL PI IND STRL 7.0 (GLOVE) ×1 IMPLANT
GLOVE BIOGEL PI IND STRL 8 (GLOVE) ×1 IMPLANT
GLOVE BIOGEL PI INDICATOR 7.0 (GLOVE) ×1
GLOVE BIOGEL PI INDICATOR 8 (GLOVE) ×1
GOWN STRL REUS W/ TWL LRG LVL3 (GOWN DISPOSABLE) ×1 IMPLANT
GOWN STRL REUS W/ TWL XL LVL3 (GOWN DISPOSABLE) ×1 IMPLANT
GOWN STRL REUS W/TWL LRG LVL3 (GOWN DISPOSABLE) ×1
GOWN STRL REUS W/TWL XL LVL3 (GOWN DISPOSABLE) ×1
HANDPIECE INTERPULSE COAX TIP (DISPOSABLE) ×1
HEMOSTAT SURGICEL 2X14 (HEMOSTASIS) ×2 IMPLANT
HOOD PEEL AWAY FLYTE STAYCOOL (MISCELLANEOUS) ×4 IMPLANT
INSERT SMALL SOCKET 32MM NEU (Insert) ×2 IMPLANT
KIT BASIN OR (CUSTOM PROCEDURE TRAY) ×2 IMPLANT
KIT ROOM TURNOVER OR (KITS) ×2 IMPLANT
MANIFOLD NEPTUNE II (INSTRUMENTS) ×2 IMPLANT
NEEDLE MAYO TROCAR (NEEDLE) ×2 IMPLANT
NS IRRIG 1000ML POUR BTL (IV SOLUTION) ×2 IMPLANT
P2 COATDE GLNOID BSEPLT 6.5X30 (Shoulder) ×2 IMPLANT
PACK SHOULDER (CUSTOM PROCEDURE TRAY) ×2 IMPLANT
PAD ARMBOARD 7.5X6 YLW CONV (MISCELLANEOUS) ×4 IMPLANT
RESTRAINT HEAD UNIVERSAL NS (MISCELLANEOUS) ×2 IMPLANT
RETRIEVER SUT HEWSON (MISCELLANEOUS) ×2 IMPLANT
SCREW BONE LOCKING RSP 5.0X30 (Screw) ×2 IMPLANT
SCREW BONE RSP LOCK 5X18 (Screw) ×2 IMPLANT
SCREW BONE RSP LOCK 5X26 (Screw) ×1 IMPLANT
SCREW BONE RSP LOCK 5X30 (Screw) ×1 IMPLANT
SCREW BONE RSP LOCKING 18MM LG (Screw) ×4 IMPLANT
SCREW BONE RSP LOCKING 5.0X26 (Screw) ×2 IMPLANT
SCREW RETAIN W/HEAD 4MM OFFSET (Shoulder) ×2 IMPLANT
SET HNDPC FAN SPRY TIP SCT (DISPOSABLE) ×1 IMPLANT
SLING ARM FOAM STRAP LRG (SOFTGOODS) ×2 IMPLANT
SLING ARM FOAM STRAP MED (SOFTGOODS) IMPLANT
SMARTMIX MINI TOWER (MISCELLANEOUS) ×2
SPONGE LAP 18X18 X RAY DECT (DISPOSABLE) ×2 IMPLANT
SPONGE LAP 4X18 X RAY DECT (DISPOSABLE) IMPLANT
STEM HUMERAL REV SHL 6X108 SM (Stem) ×2 IMPLANT
STRIP CLOSURE SKIN 1/2X4 (GAUZE/BANDAGES/DRESSINGS) ×2 IMPLANT
SUCTION FRAZIER HANDLE 10FR (MISCELLANEOUS) ×1
SUCTION TUBE FRAZIER 10FR DISP (MISCELLANEOUS) ×1 IMPLANT
SUPPORT WRAP ARM LG (MISCELLANEOUS) ×2 IMPLANT
SUT ETHIBOND NAB CT1 #1 30IN (SUTURE) ×6 IMPLANT
SUT FIBERWIRE #2 38 T-5 BLUE (SUTURE)
SUT MNCRL AB 4-0 PS2 18 (SUTURE) ×2 IMPLANT
SUT VIC AB 2-0 CT1 27 (SUTURE) ×1
SUT VIC AB 2-0 CT1 TAPERPNT 27 (SUTURE) ×1 IMPLANT
SUTURE FIBERWR #2 38 T-5 BLUE (SUTURE) IMPLANT
TAPE LABRALWHITE 1.5X36 (TAPE) ×2 IMPLANT
TAPE SUT LABRALTAP WHT/BLK (SUTURE) ×2 IMPLANT
TOWEL OR 17X26 10 PK STRL BLUE (TOWEL DISPOSABLE) ×2 IMPLANT
TOWER SMARTMIX MINI (MISCELLANEOUS) ×1 IMPLANT

## 2017-11-22 NOTE — Op Note (Signed)
Procedure(s): RIGHT REVERSE TOTAL SHOULDER ARTHROPLASTY Procedure Note  Jennifer Hull female 77 y.o. 11/22/2017  Procedure(s) and Anesthesia Type:    * RIGHT REVERSE TOTAL SHOULDER ARTHROPLASTY - Choice   Indications:  77 y.o. 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 Pain and dysfunction interfered with quality of life and nonoperative treatment with activity modification, NSAIDS and injections failed.     Surgeon: Isabella Stalling   Assistants: Jeanmarie Hubert PA-C [facility] [place] was present and scrubbed throughout the procedure and was essential in positioning, retraction, exposure, and closure)  Anesthesia: General endotracheal anesthesia with preoperative interscalene block given by the attending anesthesiologist    Procedure Detail  RIGHT REVERSE TOTAL SHOULDER ARTHROPLASTY   Estimated Blood Loss:  200 mL         Drains: none  Blood Given: none          Specimens: none        Complications:  * No complications entered in OR log *         Disposition: PACU - hemodynamically stable.         Condition: stable      OPERATIVE FINDINGS:  A DJO Altivate pressfit reverse total shoulder arthroplasty was placed with a  size 6 stem, a 32-4 glenosphere, and a standard-mm poly insert. The base plate  fixation was excellent.  PROCEDURE: The patient was identified in the preoperative holding area  where I personally marked the operative site after verifying site, side,  and procedure with the patient. An interscalene block given by  the attending anesthesiologist in the holding area and the patient was taken back to the operating room where all extremities were  carefully padded in position after general anesthesia was induced. She  was placed in a beach-chair position and the operative upper extremity was  prepped and draped in a standard sterile fashion. An approximately 10-  cm incision was made from the tip of the coracoid  process to the center  point of the humerus at the level of the axilla. Dissection was carried  down through subcutaneous tissues to the level of the cephalic vein  which was taken laterally with the deltoid. The pectoralis major was  retracted medially. The subdeltoid space was developed and the lateral  edge of the conjoined tendon was identified. The undersurface of  conjoined tendon was palpated and the musculocutaneous nerve was not in  the field. Retractor was placed underneath the conjoined and second  retractor was placed lateral into the deltoid. The circumflex humeral  artery and vessels were identified and clamped and coagulated. The  biceps tendon was tenodesed to the upper border of the pectoralis.  The subscapularis was taken down as a peel.  The  joint was then gently externally rotated while the capsule was released  from the humeral neck around to just beyond the 6 o'clock position. At  this point, the joint was dislocated and the humeral head was presented  into the wound. The excessive osteophyte formation was removed with a  large rongeur.  The cutting guide was used to make the appropriate  head cut and the head was saved for potentially bone grafting.  The glenoid was exposed with the arm in an  abducted extended position. The anterior and posterior labrum were  completely excised and the capsule was released circumferentially to  allow for exposure of the glenoid for preparation. The 2.5 mm drill was  placed using the guide in 5-10 inferior  angulation and the tap was then advanced in the same hole. Small and large reamers were then used. The tap was then removed and the Metaglene was then screwed in with excellent purchase.  The peripheral guide was then used to drilled measured and filled peripheral locking screws. The size 32-4 glenosphere was then impacted on the [facility] taper and the central screw was placed. The humerus was then again exposed and the diaphyseal reamers  were used followed by the metaphyseal reamers. The final broach was left in place in the proximal trial was placed. The joint was reduced and with this implant it was felt that soft tissue tensioning was appropriate with excellent stability and excellent range of motion. Therefore, final humeral stem was placed press-fit with bone grafting.  And then the trial polyethylene inserts were tested again and the above implant was felt to be the most appropriate for final insertion. The joint was reduced taken through full range of motion and felt to be stable. Soft tissue tension was appropriate.  The joint was then copiously irrigated with pulse  lavage and the wound was then closed. The subscapularis was repaired with #2 FiberWire through bone tunnels on the lesser tuberosity.  Skin was closed with 2-0 Vicryl in a deep dermal layer and 4-0  Monocryl for skin closure. Steri-Strips were applied. Sterile  dressings were then applied as well as a sling. The patient was allowed  to awaken from general anesthesia, transferred to stretcher, and taken  to recovery room in stable condition.   POSTOPERATIVE PLAN: The patient will be kept in the hospital postoperatively  for pain control and therapy.

## 2017-11-22 NOTE — Transfer of Care (Signed)
Immediate Anesthesia Transfer of Care Note  Patient: Jennifer Hull  Procedure(s) Performed: RIGHT REVERSE TOTAL SHOULDER ARTHROPLASTY (Right Shoulder)  Patient Location: PACU  Anesthesia Type:GA combined with regional for post-op pain  Level of Consciousness: drowsy and patient cooperative  Airway & Oxygen Therapy: Patient Spontanous Breathing and Patient connected to face mask oxygen  Post-op Assessment: Report given to RN and Post -op Vital signs reviewed and stable  Post vital signs: Reviewed and stable  Last Vitals:  Vitals:   11/22/17 1200 11/22/17 1400  BP: 101/84   Pulse: 73   Resp: 15   Temp:  (!) (P) 36.2 C  SpO2: 95%     Last Pain:  Vitals:   11/22/17 1049  TempSrc: Oral         Complications: No apparent anesthesia complications

## 2017-11-22 NOTE — Anesthesia Procedure Notes (Addendum)
Anesthesia Regional Block: Interscalene brachial plexus block   Pre-Anesthetic Checklist: ,, timeout performed, Correct Patient, Correct Site, Correct Laterality, Correct Procedure,, site marked, risks and benefits discussed, Surgical consent,  Pre-op evaluation,  At surgeon's request and post-op pain management  Laterality: Right  Prep: chloraprep       Needles:  Injection technique: Single-shot  Needle Type: Echogenic Stimulator Needle     Needle Length: 9cm  Needle Gauge: 21     Additional Needles:   Procedures:,,,, ultrasound used (permanent image in chart),,,,  Narrative:  Start time: 11/22/2017 11:30 AM End time: 11/22/2017 11:40 AM Injection made incrementally with aspirations every 5 mL.  Performed by: Personally  Anesthesiologist: Murvin Natal, MD  Additional Notes: Functioning IV was confirmed and monitors were applied.  A 27mm 21ga Arrow echogenic stimulator needle was used. Sterile prep, hand hygiene and sterile gloves were used.  Negative aspiration and negative test dose prior to incremental administration of local anesthetic. The patient tolerated the procedure well.

## 2017-11-22 NOTE — Anesthesia Preprocedure Evaluation (Addendum)
Anesthesia Evaluation  Patient identified by MRN, date of birth, ID band Patient awake    Reviewed: Allergy & Precautions, NPO status , Patient's Chart, lab work & pertinent test results  Airway Mallampati: II  TM Distance: >3 FB Neck ROM: Full    Dental  (+) Partial Upper, Partial Lower   Pulmonary former smoker,    Pulmonary exam normal breath sounds clear to auscultation       Cardiovascular negative cardio ROS Normal cardiovascular exam Rhythm:Regular Rate:Normal  ECHO: Left ventricle: The cavity size was normal. Systolic function was normal. The estimated ejection fraction was in the range of 55% to 60%. Wall motion was normal; there were no regional wall motion abnormalities. Left ventricular diastolic function parameters were normal.  Cardiologist - Nasher   Neuro/Psych PSYCHIATRIC DISORDERS Anxiety Depression Temporal arteritis     GI/Hepatic Neg liver ROS, GERD  Medicated and Controlled,  Endo/Other  Hypothyroidism   Renal/GU negative Renal ROS     Musculoskeletal negative musculoskeletal ROS (+)   Abdominal   Peds  Hematology HLD   Anesthesia Other Findings RIGHT SHOULDER ROTATOR CUFF TEAR ARTHROPATHY  Reproductive/Obstetrics                            Anesthesia Physical Anesthesia Plan  ASA: II  Anesthesia Plan: General and Regional   Post-op Pain Management: GA combined w/ Regional for post-op pain   Induction: Intravenous  PONV Risk Score and Plan: 3 and Ondansetron, Dexamethasone, Midazolam and Treatment may vary due to age or medical condition  Airway Management Planned: Oral ETT  Additional Equipment:   Intra-op Plan:   Post-operative Plan: Extubation in OR  Informed Consent: I have reviewed the patients History and Physical, chart, labs and discussed the procedure including the risks, benefits and alternatives for the proposed anesthesia with the patient or  authorized representative who has indicated his/her understanding and acceptance.   Dental advisory given  Plan Discussed with: CRNA  Anesthesia Plan Comments:         Anesthesia Quick Evaluation

## 2017-11-22 NOTE — Anesthesia Postprocedure Evaluation (Signed)
Anesthesia Post Note  Patient: Lowella G Peerson  Procedure(s) Performed: RIGHT REVERSE TOTAL SHOULDER ARTHROPLASTY (Right Shoulder)     Patient location during evaluation: PACU Anesthesia Type: Regional and General Level of consciousness: awake and alert Pain management: pain level controlled Vital Signs Assessment: post-procedure vital signs reviewed and stable Respiratory status: spontaneous breathing, nonlabored ventilation, respiratory function stable and patient connected to nasal cannula oxygen Cardiovascular status: blood pressure returned to baseline and stable Postop Assessment: no apparent nausea or vomiting Anesthetic complications: no    Last Vitals:  Vitals:   11/22/17 1513 11/22/17 1554  BP: (!) 85/45 (!) 104/56  Pulse: 68 67  Resp: 18   Temp: 36.5 C   SpO2: 98% 100%    Last Pain:  Vitals:   11/22/17 1513  TempSrc: Oral  PainSc:                  Ryan P Ellender

## 2017-11-22 NOTE — Discharge Instructions (Signed)

## 2017-11-22 NOTE — H&P (Signed)
Jennifer Hull is an 77 y.o. female.   Chief Complaint: R shoulder pain and dysfunction HPI: Endstage R shoulder arthritis with rotator cuff disease significant pain and dysfunction, failed conservative measures.  Pain interferes with sleep and quality of life.   Past Medical History:  Diagnosis Date  . Anxiety   . Arthritis   . Depression   . Hyperlipidemia   . Osteoporosis   . Temporal arteritis ([place])   . Thyroid disease     Past Surgical History:  Procedure Laterality Date  . ABDOMINAL HYSTERECTOMY    . BREAST EXCISIONAL BIOPSY Left   . BREAST SURGERY     biopsy  . COLONOSCOPY    . TONSILLECTOMY      Family History  Problem Relation Age of Onset  . Heart disease Mother   . Kidney disease Mother   . Heart disease Father   . Kidney disease Father   . Breast cancer Maternal Aunt    Social History:  reports that she has quit smoking. Her smoking use included cigarettes. she has never used smokeless tobacco. She reports that she does not drink alcohol or use drugs.  Allergies:  Allergies  Allergen Reactions  . Tdap [Diphth-Acell Pertussis-Tetanus] Other (See Comments)    SEVERE ALLERGIC REACTIONS (NECK STIFFNESS/HIGH FEVER/ETC)    Medications Prior to Admission  Medication Sig Dispense Refill  . acetaminophen (TYLENOL) 500 MG tablet Take 1,000 mg by mouth every 6 (six) hours as needed (FOR PAIN).    Marland Kitchen alendronate (FOSAMAX) 70 MG tablet Take 70 mg by mouth every Friday.     Marland Kitchen atorvastatin (LIPITOR) 10 MG tablet TAKE ONE TABLET BY MOUTH ONCE DAILY (Patient taking differently: TAKE ONE TABLET BY MOUTH ONCE DAILY AT NIGHT) 90 tablet 2  . Calcium Carb-Cholecalciferol (CALCIUM 600+D3 PO) Take 1 tablet by mouth daily with lunch.    . cetirizine (ZYRTEC) 10 MG tablet Take 10 mg by mouth daily.    . Cholecalciferol (VITAMIN D3) 2000 units TABS Take 2,000 Units by mouth daily.    . folic acid (FOLVITE) 253 MCG tablet Take 800 mcg by mouth daily.    Marland Kitchen ibuprofen (ADVIL,MOTRIN)  600 MG tablet Take 600 mg by mouth as needed.    Marland Kitchen levothyroxine (SYNTHROID, LEVOTHROID) 112 MCG tablet Take 1 tablet by mouth for 5 days, and 1/2 tablet on the other 2 days. (Patient taking differently: Take 56-112 mcg by mouth See admin instructions. TAKE 0.5 TABLET (56 MCG) ON Saturday AND Sunday, TAKE 1 TABLET (112 MCG) ON ALL OTHER DAYS OF THE WEEK Monday TO Friday.) 90 tablet 0  . omeprazole (PRILOSEC) 20 MG capsule Take 1 capsule (20 mg total) by mouth daily. 30 capsule 3  . Probiotic Product (PROBIOTIC PO) Take 1 capsule by mouth daily.    . psyllium (REGULOID) 0.52 g capsule Take 1.04-1.56 g by mouth at bedtime.    . pyridOXINE (VITAMIN B-6) 100 MG tablet Take 100 mg by mouth daily.    . sertraline (ZOLOFT) 100 MG tablet Take 200 mg by mouth daily.    . temazepam (RESTORIL) 15 MG capsule TAKE ONE CAPSULE BY MOUTH ONCE DAILY AT BEDTIME AS NEEDED FOR SLEEP 30 capsule 1    No results found for this or any previous visit (from the past 48 hour(s)). No results found.  Review of Systems  All other systems reviewed and are negative.   Blood pressure (!) 135/59, pulse 73, temperature (!) 97.5 F (36.4 C), temperature source Oral, resp. rate 20,  height 5\' 3"  (1.6 m), weight 62.6 kg (138 lb), SpO2 98 %. Physical Exam  Constitutional: She is oriented to person, place, and time. She appears well-developed and well-nourished.  HENT:  Head: Atraumatic.  Eyes: EOM are normal.  Cardiovascular: Intact distal pulses.  Respiratory: Effort normal.  Musculoskeletal:  R shoulder pain with limited motion. NVID  Neurological: She is alert and oriented to person, place, and time.  Skin: Skin is warm and dry.  Psychiatric: She has a normal mood and affect.     Assessment/Plan R shoulder rotator cuff tear arthopathy Plan R reverse TSA Risks / benefits of surgery discussed Consent on chart  NPO for OR Preop antibiotics   Isabella Stalling, MD 11/22/2017, 11:53 AM

## 2017-11-22 NOTE — Anesthesia Procedure Notes (Signed)
Procedure Name: Intubation Date/Time: 11/22/2017 12:22 PM Performed by: Freddie Breech, CRNA Pre-anesthesia Checklist: Patient identified, Emergency Drugs available, Suction available and Patient being monitored Patient Re-evaluated:Patient Re-evaluated prior to induction Oxygen Delivery Method: Circle System Utilized Preoxygenation: Pre-oxygenation with 100% oxygen Induction Type: IV induction and Cricoid Pressure applied Ventilation: Mask ventilation without difficulty Laryngoscope Size: Mac and 3 Grade View: Grade II Tube type: Oral Tube size: 7.0 mm Number of attempts: 1 Airway Equipment and Method: Stylet and Oral airway Placement Confirmation: ETT inserted through vocal cords under direct vision,  positive ETCO2 and breath sounds checked- equal and bilateral Secured at: 21 cm Tube secured with: Tape Dental Injury: Teeth and Oropharynx as per pre-operative assessment

## 2017-11-23 ENCOUNTER — Encounter (HOSPITAL_COMMUNITY): Payer: Self-pay | Admitting: Orthopedic Surgery

## 2017-11-23 LAB — BASIC METABOLIC PANEL
Anion gap: 12 (ref 5–15)
BUN: 15 mg/dL (ref 6–20)
CHLORIDE: 107 mmol/L (ref 101–111)
CO2: 20 mmol/L — ABNORMAL LOW (ref 22–32)
Calcium: 8.5 mg/dL — ABNORMAL LOW (ref 8.9–10.3)
Creatinine, Ser: 0.71 mg/dL (ref 0.44–1.00)
GFR calc non Af Amer: >60 mL/min (ref >60)
Glucose, Bld: 99 mg/dL (ref 65–99)
POTASSIUM: 3.4 mmol/L — AB (ref 3.5–5.1)
SODIUM: 139 mmol/L (ref 135–145)

## 2017-11-23 LAB — CBC
HCT: 34.4 % — ABNORMAL LOW (ref 36.0–46.0)
HEMOGLOBIN: 11.4 g/dL — AB (ref 12.0–15.0)
MCH: 29.8 pg (ref 26.0–34.0)
MCHC: 33.1 g/dL (ref 30.0–36.0)
MCV: 89.8 fL (ref 78.0–100.0)
Platelets: 199 10*3/uL (ref 150–400)
RBC: 3.83 MIL/uL — AB (ref 3.87–5.11)
RDW: 13.2 % (ref 11.5–15.5)
WBC: 12.3 10*3/uL — ABNORMAL HIGH (ref 4.0–10.5)

## 2017-11-23 MED ORDER — SODIUM CHLORIDE 0.9 % IV SOLN
INTRAVENOUS | Status: AC
  Administered 2017-11-23: 15:00:00 via INTRAVENOUS

## 2017-11-23 MED ORDER — TRAMADOL HCL 50 MG PO TABS
50.0000 mg | ORAL_TABLET | Freq: Four times a day (QID) | ORAL | Status: DC
  Administered 2017-11-23: 50 mg via ORAL
  Administered 2017-11-24 (×3): 100 mg via ORAL
  Filled 2017-11-23 (×2): qty 2
  Filled 2017-11-23: qty 1
  Filled 2017-11-23: qty 2

## 2017-11-23 MED ORDER — SODIUM CHLORIDE 0.9 % IV BOLUS (SEPSIS)
500.0000 mL | Freq: Once | INTRAVENOUS | Status: AC
  Administered 2017-11-23: 500 mL via INTRAVENOUS

## 2017-11-23 MED ORDER — DOCUSATE SODIUM 100 MG PO CAPS: 100.0000 mg | ORAL_CAPSULE | Freq: Three times a day (TID) | ORAL | 0 refills | Status: DC | PRN

## 2017-11-23 MED ORDER — OXYCODONE-ACETAMINOPHEN 5-325 MG PO TABS: 1.0000 | ORAL_TABLET | ORAL | 0 refills | Status: DC | PRN

## 2017-11-23 NOTE — Progress Notes (Signed)
   PATIENT ID: Jennifer Hull   1 Day Post-Op Procedure(s) (LRB): RIGHT REVERSE TOTAL SHOULDER ARTHROPLASTY (Right)  Subjective: Doing well this am, block is wearing off. Some low back pain. No other complaints. Feels like she can go home today.   Objective:  Vitals:   11/23/17 0004 11/23/17 0534  BP: (!) 98/44 (!) 97/43  Pulse: 66 74  Resp: 18 18  Temp: 98.1 F (36.7 C) 98.4 F (36.9 C)  SpO2: 95% 96%     R UE dressing c/d/i wigges fingers, distally NVI  Labs:  Recent Labs    11/23/17 0710  HGB 11.4*   Recent Labs    11/23/17 0710  WBC 12.3*  RBC 3.83*  HCT 34.4*  PLT 199   Recent Labs    11/23/17 0710  NA 139  K 3.4*  CL 107  CO2 20*  BUN 15  CREATININE 0.71  GLUCOSE 99  CALCIUM 8.5*    Assessment and Plan: 1 day s/p R reverse TSA Hypotensive po, will bolus 500cc NS OT- hand wrist elbow only D/c home today when cleared by OT Fu in 2 weeks  VTE proph: asa, scds

## 2017-11-23 NOTE — Discharge Summary (Signed)
Patient ID: Jennifer Hull MRN: [id number] DOB/AGE: 1941-09-27 77 y.o.  Admit date: 11/22/2017 Discharge date: 11/23/2017  Admission Diagnoses:  Active Problems:   S/P reverse total shoulder arthroplasty, right   Discharge Diagnoses:  Same  Past Medical History:  Diagnosis Date  . Anxiety   . Arthritis   . Depression   . Hyperlipidemia   . Osteoporosis   . Temporal arteritis ([place])   . Thyroid disease     Surgeries: Procedure(s): RIGHT REVERSE TOTAL SHOULDER ARTHROPLASTY on 11/22/2017   Consultants:   Discharged Condition: Improved  Hospital Course: Jennifer Hull is an 77 y.o. female who was admitted 11/22/2017 for operative treatment of right rotator cuff tear arthropathy. Patient has severe unremitting pain that affects sleep, daily activities, and work/hobbies. After pre-op clearance the patient was taken to the operating room on 11/22/2017 and underwent  Procedure(s): RIGHT REVERSE TOTAL SHOULDER ARTHROPLASTY.    Patient was given perioperative antibiotics:  Anti-infectives (From admission, onward)   Start     Dose/Rate Route Frequency Ordered Stop   11/22/17 1830  ceFAZolin (ANCEF) IVPB 1 g/50 mL premix     1 g 100 mL/hr over 30 Minutes Intravenous Every 6 hours 11/22/17 1517 11/23/17 1229   11/22/17 1200  ceFAZolin (ANCEF) IVPB 2g/100 mL premix     2 g 200 mL/hr over 30 Minutes Intravenous To [facility] 11/21/17 0813 11/22/17 1229       Patient was given sequential compression devices, early ambulation, and asa to prevent DVT.  Patient benefited maximally from hospital stay and there were no complications.    Recent vital signs:  Patient Vitals for the past 24 hrs:  BP Temp Temp src Pulse Resp SpO2 Height Weight  11/23/17 0534 (!) 97/43 98.4 F (36.9 C) Oral 74 18 96 % - -  11/23/17 0004 (!) 98/44 98.1 F (36.7 C) Oral 66 18 95 % - -  11/22/17 2045 (!) 86/42 98.4 F (36.9 C) Oral 89 18 95 % - -  11/22/17 1554 (!) 104/56 - - 67 - 100 % - -   11/22/17 1513 (!) 85/45 97.7 F (36.5 C) Oral 68 18 98 % - -  11/22/17 1445 (!) 98/38 (!) 97.4 F (36.3 C) - (!) 58 17 98 % - -  11/22/17 1430 (!) 98/45 - - 62 18 100 % - -  11/22/17 1415 (!) 87/43 - - (!) 59 19 100 % - -  11/22/17 1400 (!) 98/44 (!) 97.2 F (36.2 C) - 64 10 100 % - -  11/22/17 1200 101/84 - - 73 15 95 % - -  11/22/17 1155 (!) 109/43 - - 73 (!) 27 92 % - -  11/22/17 1150 (!) 113/48 - - 77 16 96 % - -  11/22/17 1145 (!) 94/48 - - 74 19 94 % - -  11/22/17 1140 (!) 136/111 - - 70 12 95 % - -  11/22/17 1135 - - - 72 18 94 % - -  11/22/17 1130 - - - 74 16 94 % - -  11/22/17 1125 - - - 72 19 95 % - -  11/22/17 1120 - - - 76 11 97 % - -  11/22/17 1115 - - - 73 15 96 % - -  11/22/17 1110 - - - 77 13 97 % - -  11/22/17 1105 - - - 71 10 98 % - -  11/22/17 1058 - - - - - - 5\' 3"  (1.6 m) 62.6 kg (138  lb)  11/22/17 1050 (!) 135/59 - - 73 - 98 % - -  11/22/17 1049 (!) 135/59 (!) 97.5 F (36.4 C) Oral 73 20 98 % - -     Recent laboratory studies:  Recent Labs    11/23/17 0710  WBC 12.3*  HGB 11.4*  HCT 34.4*  PLT 199  NA 139  K 3.4*  CL 107  CO2 20*  BUN 15  CREATININE 0.71  GLUCOSE 99  CALCIUM 8.5*     Discharge Medications:   Allergies as of 11/23/2017      Reactions   Tdap [diphth-acell Pertussis-tetanus] Other (See Comments)   SEVERE ALLERGIC REACTIONS (NECK STIFFNESS/HIGH FEVER/ETC)      Medication List    STOP taking these medications   acetaminophen 500 MG tablet Commonly known as:  TYLENOL   ibuprofen 600 MG tablet Commonly known as:  ADVIL,MOTRIN     TAKE these medications   alendronate 70 MG tablet Commonly known as:  FOSAMAX Take 70 mg by mouth every Friday.   atorvastatin 10 MG tablet Commonly known as:  LIPITOR TAKE ONE TABLET BY MOUTH ONCE DAILY What changed:    how much to take  how to take this  when to take this   CALCIUM 600+D3 PO Take 1 tablet by mouth daily with lunch.   cetirizine 10 MG tablet Commonly known as:   ZYRTEC Take 10 mg by mouth daily.   docusate sodium 100 MG capsule Commonly known as:  COLACE Take 1 capsule (100 mg total) by mouth 3 (three) times daily as needed.   folic acid 409 MCG tablet Commonly known as:  FOLVITE Take 800 mcg by mouth daily.   levothyroxine 112 MCG tablet Commonly known as:  SYNTHROID, LEVOTHROID Take 1 tablet by mouth for 5 days, and 1/2 tablet on the other 2 days. What changed:    how much to take  how to take this  when to take this  additional instructions   omeprazole 20 MG capsule Commonly known as:  PRILOSEC Take 1 capsule (20 mg total) by mouth daily.   oxyCODONE-acetaminophen 5-325 MG tablet Commonly known as:  PERCOCET Take 1-2 tablets by mouth every 4 (four) hours as needed for severe pain.   PROBIOTIC PO Take 1 capsule by mouth daily.   psyllium 0.52 g capsule Commonly known as:  REGULOID Take 1.04-1.56 g by mouth at bedtime.   pyridOXINE 100 MG tablet Commonly known as:  VITAMIN B-6 Take 100 mg by mouth daily.   sertraline 100 MG tablet Commonly known as:  ZOLOFT Take 200 mg by mouth daily.   temazepam 15 MG capsule Commonly known as:  RESTORIL TAKE ONE CAPSULE BY MOUTH ONCE DAILY AT BEDTIME AS NEEDED FOR SLEEP   Vitamin D3 2000 units Tabs Take 2,000 Units by mouth daily.       Diagnostic Studies: Dg Shoulder Right Port  Result Date: 11/22/2017 CLINICAL DATA:  Postop reverse glenohumeral arthroplasty. EXAM: PORTABLE RIGHT SHOULDER COMPARISON:  None. FINDINGS: Reverse glenohumeral arthroplasty on the right. The prosthesis is in continuity on this single projection. Expected postoperative gas. No visible fracture. IMPRESSION: New glenohumeral arthroplasty without acute finding. Electronically Signed   By: Monte Fantasia M.D.   On: 11/22/2017 14:37    Disposition: Final discharge disposition not confirmed  Discharge Instructions    Call MD / Call 911   Complete by:  As directed    If you experience chest pain or  shortness of breath, CALL 911 and  be transported to the hospital emergency room.  If you develope a fever above 101 F, pus (white drainage) or increased drainage or redness at the wound, or calf pain, call your surgeon's office.   Constipation Prevention   Complete by:  As directed    Drink plenty of fluids.  Prune juice may be helpful.  You may use a stool softener, such as Colace (over the counter) 100 mg twice a day.  Use MiraLax (over the counter) for constipation as needed.   Diet - low sodium heart healthy   Complete by:  As directed    Increase activity slowly as tolerated   Complete by:  As directed       Follow-up Information    Tania Ade, MD. Schedule an appointment as soon as possible for a visit in 2 weeks.   Specialty:  Orthopedic Surgery Contact information: [address] [address] [phone]            Signed: Grier Mitts 11/23/2017, 8:15 AM

## 2017-11-23 NOTE — Progress Notes (Signed)
OT Cancellation Note  Patient Details Name: Jennifer Hull MRN: [id number] DOB: 02-Apr-1941   Cancelled Treatment:    Reason Eval/Treat Not Completed: Medical issues which prohibited therapy. Pt having nausea with emesis. Nursing aware and involved. Plan to reattempt later this morning.  Tyrone Schimke OTR/L Pager: [phone]  11/23/2017, 10:49 AM

## 2017-11-23 NOTE — Evaluation (Signed)
Occupational Therapy Evaluation Patient Details Name: Jennifer Hull MRN: [id number] DOB: [birth year] Today's Date: 11/23/2017    History of Present Illness S/P reverse total shoulder arthroplasty, right.   Clinical Impression   Pt admitted with the above diagnoses and presents with below problem list. Pt will benefit from continued acute OT to address the below listed deficits and maximize independence with ADLs prior to d/c home with spouse assisting as needed. PTA pt was independent with ADLs. Pt is currently mod A for UB/LB ADLs, min guard to min A for functional mobility/transfers. Pt noted to use IV pole for stability walking around in the room. Session limited by fatigue from nausea with emesis earlier this morning. Spouse present, involved, and included in education.      Follow Up Recommendations  Follow surgeon's recommendation for DC plan and follow-up therapies;Supervision/Assistance - 24 hour    Equipment Recommendations  None recommended by OT    Recommendations for Other Services       Precautions / Restrictions Precautions Precautions: Shoulder Type of Shoulder Precautions: conservative Shoulder Interventions: Shoulder sling/immobilizer;At all times;Off for dressing/bathing/exercises Precaution Booklet Issued: Yes (comment) Required Braces or Orthoses: Sling Restrictions Weight Bearing Restrictions: Yes RUE Weight Bearing: Non weight bearing      Mobility Bed Mobility               General bed mobility comments: up in chair  Transfers Overall transfer level: Needs assistance Equipment used: (IV pole) Transfers: Sit to/from Stand Sit to Stand: Min guard;Min assist         General transfer comment: steadying assist. 1x uncontrolled return to sitting after initial stand, assist to control descent    Balance Overall balance assessment: Needs assistance Sitting-balance support: Feet supported Sitting balance-Leahy Scale: Fair     Standing  balance support: Single extremity supported;During functional activity Standing balance-Leahy Scale: Fair Standing balance comment: able to static stand with no external support, seeks external support for dynamic standing and mobility.                            ADL either performed or assessed with clinical judgement   ADL Overall ADL's : Needs assistance/impaired Eating/Feeding: Set up;Sitting   Grooming: Minimal assistance;Sitting   Upper Body Bathing: Moderate assistance;Sitting   Lower Body Bathing: Moderate assistance;Sit to/from stand   Upper Body Dressing : Moderate assistance;Sitting   Lower Body Dressing: Moderate assistance;Sit to/from stand   Toilet Transfer: Min guard;Minimal assistance;Ambulation Toilet Transfer Details (indicate cue type and reason): used IV pole for stability Toileting- Clothing Manipulation and Hygiene: Minimal assistance;Sit to/from stand   Tub/ Shower Transfer: Tub transfer;Minimal assistance;Ambulation   Functional mobility during ADLs: Min guard General ADL Comments: Pt completed in-room functional mobility. Began shoulder education. Pt seeking external support during mobility. Generalized weakness from nausea with emesis this morning.      Vision         Perception     Praxis      Pertinent Vitals/Pain Pain Assessment: 0-10 Pain Score: 5  Pain Location: back>R shoulder Pain Descriptors / Indicators: Aching;Sore Pain Intervention(s): Monitored during session;Premedicated before session;Limited activity within patient's tolerance;Repositioned     Hand Dominance Right   Extremity/Trunk Assessment Upper Extremity Assessment Upper Extremity Assessment: RUE deficits/detail RUE Deficits / Details: S/P reverse total shoulder arthroplasty, right RUE: Unable to fully assess due to immobilization   Lower Extremity Assessment Lower Extremity Assessment: Overall WFL for tasks assessed  Communication  Communication Communication: No difficulties   Cognition Arousal/Alertness: Awake/alert Behavior During Therapy: WFL for tasks assessed/performed Overall Cognitive Status: Within Functional Limits for tasks assessed                                     General Comments       Exercises Exercises: Other exercises Other Exercises Other Exercises: e/w/h in seated position. At times pt using L hand to range R elbow.    Shoulder Instructions      Home Living Family/patient expects to be discharged to:: Private residence Living Arrangements: Spouse/significant other Available Help at Discharge: Family;Available 24 hours/day Type of Home: Apartment Home Access: Elevator     Home Layout: One level     Bathroom Shower/Tub: Tub/shower unit         Home Equipment: Shower seat;Grab bars - tub/shower          Prior Functioning/Environment Level of Independence: Independent                 OT Problem List: Impaired balance (sitting and/or standing);Decreased knowledge of use of DME or AE;Decreased knowledge of precautions;Impaired UE functional use;Pain      OT Treatment/Interventions: Self-care/ADL training;Therapeutic exercise;DME and/or AE instruction;Therapeutic activities;Patient/family education;Balance training    OT Goals(Current goals can be found in the care plan section) Acute Rehab OT Goals Patient Stated Goal: feel better. return home.  OT Goal Formulation: With patient/family Time For Goal Achievement: 11/30/17 Potential to Achieve Goals: Good ADL Goals Pt Will Perform Upper Body Bathing: with modified independence;sitting Pt Will Perform Lower Body Bathing: with supervision;sit to/from stand Pt Will Perform Upper Body Dressing: with modified independence;sitting Pt Will Perform Lower Body Dressing: with supervision;sit to/from stand Pt Will Transfer to Toilet: with supervision;ambulating Pt Will Perform Toileting - Clothing Manipulation  and hygiene: with supervision;sit to/from stand Pt Will Perform Tub/Shower Transfer: with supervision;ambulating;Tub transfer;shower seat Pt/caregiver will Perform Home Exercise Program: Right Upper extremity;Independently;With written HEP provided Additional ADL Goal #1: Pt will complete bed mobility at supervision level to prepare for OOB ADLs.  OT Frequency: Min 3X/week   Barriers to D/C:            Co-evaluation              AM-PAC PT "6 Clicks" Daily Activity     Outcome Measure Help from another person eating meals?: None Help from another person taking care of personal grooming?: A Little Help from another person toileting, which includes using toliet, bedpan, or urinal?: A Lot Help from another person bathing (including washing, rinsing, drying)?: A Lot Help from another person to put on and taking off regular upper body clothing?: A Lot Help from another person to put on and taking off regular lower body clothing?: A Lot 6 Click Score: 15   End of Session Equipment Utilized During Treatment: Gait belt;Other (comment)(sling, iv pole) Nurse Communication: Mobility status  Activity Tolerance: Patient limited by fatigue Patient left: in chair;with call bell/phone within reach;with family/visitor present  OT Visit Diagnosis: Unsteadiness on feet (R26.81);Pain Pain - Right/Left: Right Pain - part of body: Shoulder                Time: 7673-4193 OT Time Calculation (min): 30 min Charges:  OT General Charges $OT Visit: 1 Visit OT Evaluation $OT Eval Low Complexity: 1 Low OT Treatments $Self Care/Home Management : 8-22 mins G-Codes:  Hortencia Pilar 11/23/2017, 1:23 PM

## 2017-11-24 MED ORDER — TRAMADOL HCL 50 MG PO TABS: 50.0000 mg | ORAL_TABLET | Freq: Four times a day (QID) | ORAL | 0 refills | Status: DC

## 2017-11-24 MED ORDER — HYDROCODONE-ACETAMINOPHEN 5-325 MG PO TABS
1.0000 | ORAL_TABLET | Freq: Once | ORAL | Status: AC
  Administered 2017-11-24: 1 via ORAL
  Filled 2017-11-24: qty 1

## 2017-11-24 MED ORDER — MELOXICAM 7.5 MG PO TABS
7.5000 mg | ORAL_TABLET | Freq: Every day | ORAL | Status: DC
Start: 2017-11-24 — End: 2017-11-24
  Administered 2017-11-24: 7.5 mg via ORAL
  Filled 2017-11-24: qty 1

## 2017-11-24 MED ORDER — HYDROCODONE-ACETAMINOPHEN 5-325 MG PO TABS: 1.0000 | ORAL_TABLET | Freq: Four times a day (QID) | ORAL | 0 refills | Status: DC | PRN

## 2017-11-24 MED ORDER — ONDANSETRON HCL 4 MG PO TABS: 4.0000 mg | ORAL_TABLET | Freq: Three times a day (TID) | ORAL | 1 refills | Status: DC | PRN

## 2017-11-24 MED ORDER — MELOXICAM 7.5 MG PO TABS: 7.5000 mg | ORAL_TABLET | Freq: Every day | ORAL | 0 refills | Status: DC

## 2017-11-24 NOTE — Progress Notes (Signed)
Occupational Therapy Treatment Patient Details Name: Jennifer Hull MRN: [id number] DOB: 1941-04-02 Today's Date: 11/24/2017    History of present illness S/P reverse total shoulder arthroplasty, right.   OT comments  Pt progressing towards established OT goals. Pt motivated to participate in therapy, however, limited by nausea throughout session. Pt donning clothes and sling with assistance from sister; educating sister and pt on compensatory techniques for adhere to shoulder precautions. Pt performing exercises while seated for 10 reps each. Pt vomiting at end of session; RN notified. Family verbalizing concern about nausea, pain management, and dc home today. Will continue to follow acutely as admitted and continue to recommend dc home once medically stable.    Follow Up Recommendations  Follow surgeon's recommendation for DC plan and follow-up therapies;Supervision/Assistance - 24 hour    Equipment Recommendations  None recommended by OT    Recommendations for Other Services      Precautions / Restrictions Precautions Precautions: Shoulder Type of Shoulder Precautions: conservative Shoulder Interventions: Shoulder sling/immobilizer;At all times;Off for dressing/bathing/exercises Precaution Booklet Issued: Yes (comment) Precaution Comments: Reviewed information on shoulder handout Required Braces or Orthoses: Sling Restrictions Weight Bearing Restrictions: Yes RUE Weight Bearing: Non weight bearing       Mobility Bed Mobility               General bed mobility comments: In recliner upon arrival  Transfers Overall transfer level: Needs assistance Equipment used: (IV pole) Transfers: Sit to/from Stand Sit to Stand: Min guard         General transfer comment: Min Guard for safety    Balance Overall balance assessment: Needs assistance Sitting-balance support: Feet supported Sitting balance-Leahy Scale: Fair     Standing balance support: Single extremity  supported;During functional activity Standing balance-Leahy Scale: Fair Standing balance comment: able to static stand with no external support, seeks external support for dynamic standing and mobility.                            ADL either performed or assessed with clinical judgement   ADL Overall ADL's : Needs assistance/impaired           Upper Body Bathing Details (indicate cue type and reason): Reviewed compensatory bathing techniques for shoulder precautions     Upper Body Dressing : Moderate assistance;Sitting;With caregiver independent assisting Upper Body Dressing Details (indicate cue type and reason): Donning shirt and sling with assistance from sister. VCs for correct sequencing and positioning Lower Body Dressing: Moderate assistance;Sit to/from stand;With caregiver independent assisting Lower Body Dressing Details (indicate cue type and reason): donning underwear and pants with assistance from sister.              Functional mobility during ADLs: Min guard General ADL Comments: Focused session on excercises and UB ADLs. Pt limited by nausea. Pt vomiting at end of session. Family concerned about dc home today.     Vision       Perception     Praxis      Cognition Arousal/Alertness: Awake/alert Behavior During Therapy: WFL for tasks assessed/performed Overall Cognitive Status: Within Functional Limits for tasks assessed                                 General Comments: Nauseous throughout session        Exercises Exercises: Shoulder Shoulder Exercises Elbow Flexion: AROM;Right;10 reps;Seated Elbow Extension: AROM;Right;10 reps;Seated Wrist  Flexion: AROM;Right;10 reps;Seated Wrist Extension: AROM;Right;10 reps;Seated Digit Composite Flexion: AROM;Right;10 reps;Seated Composite Extension: AROM;Right;10 reps;Seated Neck Flexion: AROM;5 reps;Seated Neck Extension: AROM;5 reps;Seated Neck Lateral Flexion - Right: AROM;5  reps;Seated Neck Lateral Flexion - Left: AROM;5 reps;Seated   Shoulder Instructions Shoulder Instructions Donning/doffing shirt without moving shoulder: Caregiver independent with task;Patient able to independently direct caregiver Method for sponge bathing under operated UE: Caregiver independent with task;Patient able to independently direct caregiver Donning/doffing sling/immobilizer: Caregiver independent with task;Patient able to independently direct caregiver Correct positioning of sling/immobilizer: Caregiver independent with task;Patient able to independently direct caregiver;Minimal assistance ROM for elbow, wrist and digits of operated UE: Supervision/safety Sling wearing schedule (on at all times/off for ADL's): Independent Proper positioning of operated UE when showering: Supervision/safety Positioning of UE while sleeping: Minimal assistance;Caregiver independent with task     General Comments      Pertinent Vitals/ Pain       Pain Assessment: Faces Faces Pain Scale: Hurts little more Pain Location: back>R shoulder Pain Descriptors / Indicators: Aching;Sore Pain Intervention(s): Monitored during session;Limited activity within patient's tolerance;Repositioned  Home Living                                          Prior Functioning/Environment              Frequency  Min 3X/week        Progress Toward Goals  OT Goals(current goals can now be found in the care plan section)  Progress towards OT goals: Progressing toward goals  Acute Rehab OT Goals Patient Stated Goal: feel better. return home.  OT Goal Formulation: With patient/family Time For Goal Achievement: 11/30/17 Potential to Achieve Goals: Good ADL Goals Pt Will Perform Upper Body Bathing: with modified independence;sitting Pt Will Perform Lower Body Bathing: with supervision;sit to/from stand Pt Will Perform Upper Body Dressing: with modified independence;sitting Pt Will  Perform Lower Body Dressing: with supervision;sit to/from stand Pt Will Transfer to Toilet: with supervision;ambulating Pt Will Perform Toileting - Clothing Manipulation and hygiene: with supervision;sit to/from stand Pt Will Perform Tub/Shower Transfer: with supervision;ambulating;Tub transfer;shower seat Pt/caregiver will Perform Home Exercise Program: Right Upper extremity;Independently;With written HEP provided Additional ADL Goal #1: Pt will complete bed mobility at supervision level to prepare for OOB ADLs.  Plan Discharge plan remains appropriate    Co-evaluation                 AM-PAC PT "6 Clicks" Daily Activity     Outcome Measure   Help from another person eating meals?: None Help from another person taking care of personal grooming?: A Little Help from another person toileting, which includes using toliet, bedpan, or urinal?: A Lot Help from another person bathing (including washing, rinsing, drying)?: A Lot Help from another person to put on and taking off regular upper body clothing?: A Lot Help from another person to put on and taking off regular lower body clothing?: A Lot 6 Click Score: 15    End of Session Equipment Utilized During Treatment: Other (comment)(sling)  OT Visit Diagnosis: Unsteadiness on feet (R26.81);Pain Pain - Right/Left: Right Pain - part of body: Shoulder   Activity Tolerance Patient tolerated treatment well(Limited by nausea)   Patient Left in chair;with call bell/phone within reach;with family/visitor present   Nurse Communication Mobility status;Precautions;Weight bearing status(Pt vomiting at end of session)        Time: 6962-9528 OT  Time Calculation (min): 29 min  Charges: OT General Charges $OT Visit: 1 Visit OT Treatments $Self Care/Home Management : 8-22 mins $Therapeutic Activity: 8-22 mins  [place], OTR/L Acute Rehab Pager: [phone] Office: [place] 11/24/2017, 12:43  PM

## 2017-11-24 NOTE — Progress Notes (Signed)
Contacted [facility] x2, leaving messages with on call doctor about pain control.

## 2017-11-24 NOTE — Progress Notes (Signed)
  PATIENT ID: Jennifer Hull  MRN: [id number]  DOB/AGE:  12-02-1940 / 77 y.o.  2 Days Post-Op Procedure(s) (LRB): RIGHT REVERSE TOTAL SHOULDER ARTHROPLASTY (Right)  Subjective: Struggling with pain control after stopping oxycodone for sake of nausea.  Tolerated Norco however, given with Zofran, with minimal nausea and will restart meloxicam.  Objective: Dressing c/d/i NVI  Assessment/Plan: D/c home with tramadol, norco (one or the other only at one time), meloxicam, and zofran F/u with Dr.Chandler as planned.  Rayvon Char Grandville Silos, [place], [place]  [address] Office: [phone] Mobile: [phone]  11/24/2017, 10:57 AM

## 2017-11-24 NOTE — Progress Notes (Signed)
Spoke with Dr. Milly Jakob about pain control for patient. One time dose of Norco PO ordered and administered to patient per [facility]. Will continue to monitor patient.

## 2017-11-24 NOTE — Progress Notes (Signed)
Called Dr. Grandville Silos due to patient vomiting after OT. vitals stable.  Patient family members concerned in room and about patient discharge. Educated patient on pharmacological and non-pharm therapies to help with pain and N/V. Cold therapy on arm, patient resting in chair, lights dimmed, call bell within reach. Please see MAR for medications given. Will continue to monitor patient after lunch.

## 2017-11-24 NOTE — Progress Notes (Signed)
Jennifer Hull to be D/C'd Home per MD order.  Discussed prescriptions and follow up appointments with the patient. Prescriptions given to patient, medication list explained in detail. Pt verbalized understanding.  Allergies as of 11/24/2017      Reactions   Tdap [diphth-acell Pertussis-tetanus] Other (See Comments)   SEVERE ALLERGIC REACTIONS (NECK STIFFNESS/HIGH FEVER/ETC)      Medication List    STOP taking these medications   acetaminophen 500 MG tablet Commonly known as:  TYLENOL   ibuprofen 600 MG tablet Commonly known as:  ADVIL,MOTRIN     TAKE these medications   alendronate 70 MG tablet Commonly known as:  FOSAMAX Take 70 mg by mouth every Friday.   atorvastatin 10 MG tablet Commonly known as:  LIPITOR TAKE ONE TABLET BY MOUTH ONCE DAILY What changed:    how much to take  how to take this  when to take this   CALCIUM 600+D3 PO Take 1 tablet by mouth daily with lunch.   cetirizine 10 MG tablet Commonly known as:  ZYRTEC Take 10 mg by mouth daily.   docusate sodium 100 MG capsule Commonly known as:  COLACE Take 1 capsule (100 mg total) by mouth 3 (three) times daily as needed.   folic acid 161 MCG tablet Commonly known as:  FOLVITE Take 800 mcg by mouth daily.   HYDROcodone-acetaminophen 5-325 MG tablet Commonly known as:  NORCO Take 1-2 tablets by mouth every 6 (six) hours as needed for moderate pain or severe pain.   levothyroxine 112 MCG tablet Commonly known as:  SYNTHROID, LEVOTHROID Take 1 tablet by mouth for 5 days, and 1/2 tablet on the other 2 days. What changed:    how much to take  how to take this  when to take this  additional instructions   meloxicam 7.5 MG tablet Commonly known as:  MOBIC Take 1 tablet (7.5 mg total) by mouth daily.   omeprazole 20 MG capsule Commonly known as:  PRILOSEC Take 1 capsule (20 mg total) by mouth daily.   ondansetron 4 MG tablet Commonly known as:  ZOFRAN Take 1 tablet (4 mg total) by mouth  every 8 (eight) hours as needed for nausea or vomiting.   PROBIOTIC PO Take 1 capsule by mouth daily.   psyllium 0.52 g capsule Commonly known as:  REGULOID Take 1.04-1.56 g by mouth at bedtime.   pyridOXINE 100 MG tablet Commonly known as:  VITAMIN B-6 Take 100 mg by mouth daily.   sertraline 100 MG tablet Commonly known as:  ZOLOFT Take 200 mg by mouth daily.   temazepam 15 MG capsule Commonly known as:  RESTORIL TAKE ONE CAPSULE BY MOUTH ONCE DAILY AT BEDTIME AS NEEDED FOR SLEEP   traMADol 50 MG tablet Commonly known as:  ULTRAM Take 1-2 tablets (50-100 mg total) by mouth every 6 (six) hours.   Vitamin D3 2000 units Tabs Take 2,000 Units by mouth daily.       Vitals:   11/24/17 0829 11/24/17 1407  BP:  (!) 109/52  Pulse:  87  Resp: (!) 25 20  Temp:  98.2 F (36.8 C)  SpO2:  90%    Skin clean, dry and intact without evidence of skin break down, no evidence of skin tears noted. Aquacel dressing in place, clean, dry, and intact. IV catheter discontinued intact. Site without signs and symptoms of complications. Dressing and pressure applied. Pt denies pain at this time. No complaints noted.  An After Visit Summary and prescriptions were  printed and given to the patient. Patient escorted via [place], and D/C home via private auto.  [place] RN

## 2017-11-29 ENCOUNTER — Ambulatory Visit: Payer: Self-pay

## 2017-11-29 NOTE — Telephone Encounter (Signed)
  Reason for Disposition . [1] Constipation persists > 1 week AND [2] following Constipation Care Advice  Answer Assessment - Initial Assessment Questions 1. STOOL PATTERN OR FREQUENCY: "How often do you pass bowel movements (BMs)?"  (Normal range: tid to q 3 days)  "When was the last BM passed?"       Has constipation normally 2. STRAINING: "Do you have to strain to have a BM?"      Yes 3. RECTAL PAIN: "Does your rectum hurt when the stool comes out?" If so, ask: "Do you have hemorrhoids? How bad is the pain?"  (Scale 1-10; or mild, moderate, severe)     No 4. STOOL COMPOSITION: "Are the stools hard?"      Has not had a BM in a week 5. BLOOD ON STOOLS: "Has there been any blood on the toilet tissue or on the surface of the BM?" If so, ask: "When was the last time?"      No 6. CHRONIC CONSTIPATION: "Is this a new problem for you?"  If no, ask: How long have you had this problem?" (days, weeks, months)      No 7. CHANGES IN DIET: "Have there been any recent changes in your diet?"      No 8. MEDICATIONS: "Have you been taking any new medications?"     Pain medication 9. LAXATIVES: "Have you been using any laxatives or enemas?"  If yes, ask "What, how often, and when was the last time?"     Yes - Senokot, MOM, Colace, suppository and 1 Fleet enema today 10. CAUSE: "What do you think is causing the constipation?"        Constipation 11. OTHER SYMPTOMS: "Do you have any other symptoms?" (e.g., abdominal pain, fever, vomiting)       No 12. PREGNANCY: "Is there any chance you are pregnant?" "When was your last menstrual period?"       No  Protocols used: CONSTIPATION-A-AH

## 2017-11-30 NOTE — Telephone Encounter (Signed)
I believe she had shoulder surgery recently and see Rx for opioid on med list. Hydrocodone can cause/aggravate constipation and even after discontinuing it symptoms may take a few weeks to resolve. If she is not having severe abdominal pain,nausea,or vomiting she can continue OTC Miralax daily and Dulcolax 5 mg until a good bowel movement. Increase fiber intake, prunes or other source of fiber, as well as fluid intake.  If not better we need to consider Rx medications, we need an evaluation. If severe abdominal pain,N/V,fever,or other warning signs, she needs to seek immediate medical attention.  Thanks, BJ

## 2017-12-04 NOTE — Telephone Encounter (Signed)
Left message to give clinic a call back. 

## 2017-12-07 DIAGNOSIS — Z471 Aftercare following joint replacement surgery: Secondary | ICD-10-CM | POA: Diagnosis not present

## 2017-12-07 DIAGNOSIS — Z96611 Presence of right artificial shoulder joint: Secondary | ICD-10-CM | POA: Diagnosis not present

## 2017-12-07 DIAGNOSIS — M19011 Primary osteoarthritis, right shoulder: Secondary | ICD-10-CM | POA: Diagnosis not present

## 2017-12-07 DIAGNOSIS — R040 Epistaxis: Secondary | ICD-10-CM | POA: Diagnosis not present

## 2017-12-07 NOTE — Telephone Encounter (Signed)
Tried calling patient several times and left several messages. Patient has yet to return call to clinic in regards to sx.

## 2017-12-10 ENCOUNTER — Other Ambulatory Visit: Payer: Self-pay | Admitting: Family Medicine

## 2017-12-10 DIAGNOSIS — G4700 Insomnia, unspecified: Secondary | ICD-10-CM

## 2017-12-10 DIAGNOSIS — E039 Hypothyroidism, unspecified: Secondary | ICD-10-CM

## 2017-12-10 NOTE — Telephone Encounter (Signed)
Copied from [place] [phone] [phone]. Topic: Quick Communication - Rx Refill/Question >> Dec 10, 2017  1:49 PM Ahmed Prima L wrote: Medication:  temazepam (RESTORIL) 15 MG capsule  Has the patient contacted their pharmacy? yes  (Agent: If no, request that the patient contact the pharmacy for the refill.)   Preferred Pharmacy (with phone number or street name): [facility], [place]   Agent: Please be advised that RX refills may take up to 3 business days. We ask that you follow-up with your pharmacy.

## 2017-12-11 NOTE — Telephone Encounter (Signed)
LOV: 06/27/18  Dr. [place]  [facility] on [address]

## 2017-12-12 ENCOUNTER — Other Ambulatory Visit: Payer: Self-pay | Admitting: Family Medicine

## 2017-12-12 DIAGNOSIS — K219 Gastro-esophageal reflux disease without esophagitis: Secondary | ICD-10-CM

## 2017-12-17 ENCOUNTER — Other Ambulatory Visit: Payer: Self-pay | Admitting: Family Medicine

## 2017-12-17 DIAGNOSIS — G4700 Insomnia, unspecified: Secondary | ICD-10-CM

## 2017-12-17 MED ORDER — TEMAZEPAM 15 MG PO CAPS: ORAL_CAPSULE | ORAL | 3 refills | Status: DC

## 2017-12-24 DIAGNOSIS — Z961 Presence of intraocular lens: Secondary | ICD-10-CM | POA: Diagnosis not present

## 2017-12-24 DIAGNOSIS — M5441 Lumbago with sciatica, right side: Secondary | ICD-10-CM | POA: Diagnosis not present

## 2017-12-24 DIAGNOSIS — H10413 Chronic giant papillary conjunctivitis, bilateral: Secondary | ICD-10-CM | POA: Diagnosis not present

## 2017-12-25 ENCOUNTER — Ambulatory Visit (INDEPENDENT_AMBULATORY_CARE_PROVIDER_SITE_OTHER): Payer: Medicare Other | Admitting: Family Medicine

## 2017-12-25 ENCOUNTER — Encounter: Payer: Self-pay | Admitting: Family Medicine

## 2017-12-25 ENCOUNTER — Ambulatory Visit (INDEPENDENT_AMBULATORY_CARE_PROVIDER_SITE_OTHER): Payer: Medicare Other

## 2017-12-25 VITALS — BP 102/62 | HR 86 | Temp 98.1°F | Resp 12 | Ht 63.0 in | Wt 134.0 lb

## 2017-12-25 VITALS — BP 102/62 | Ht 63.0 in | Wt 134.0 lb

## 2017-12-25 DIAGNOSIS — R040 Epistaxis: Secondary | ICD-10-CM | POA: Diagnosis not present

## 2017-12-25 DIAGNOSIS — Z0000 Encounter for general adult medical examination without abnormal findings: Secondary | ICD-10-CM | POA: Diagnosis not present

## 2017-12-25 DIAGNOSIS — G4700 Insomnia, unspecified: Secondary | ICD-10-CM

## 2017-12-25 DIAGNOSIS — M5440 Lumbago with sciatica, unspecified side: Secondary | ICD-10-CM | POA: Diagnosis not present

## 2017-12-25 DIAGNOSIS — F411 Generalized anxiety disorder: Secondary | ICD-10-CM

## 2017-12-25 DIAGNOSIS — M545 Low back pain, unspecified: Secondary | ICD-10-CM

## 2017-12-25 DIAGNOSIS — F331 Major depressive disorder, recurrent, moderate: Secondary | ICD-10-CM

## 2017-12-25 MED ORDER — SERTRALINE HCL 100 MG PO TABS: 150.0000 mg | ORAL_TABLET | Freq: Every day | ORAL | 2 refills | Status: DC

## 2017-12-25 NOTE — Assessment & Plan Note (Signed)
Well-controlled. No changes in Sertraline dose. Follow-up in 6 months, before if needed.

## 2017-12-25 NOTE — Assessment & Plan Note (Signed)
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 Temazepam is still helping, no changes in current management. Good sleep hygiene is also recommended. Follow-up in 6 months.

## 2017-12-25 NOTE — Progress Notes (Signed)
Subjective:   Jennifer Hull is a 77 y.o. female who presents for Medicare Annual (Subsequent) preventive examination.   Cardiac Risk Factors include: advanced age (>80men, >92 women);family history of premature cardiovascular disease;hypertensionReports health as ok Has right total shoulder recently C/o of hip pain that goes down her right leg Going to have an MRI on Saturday  Hurt her hip after falling x 77 yo   Diet BMI 23; Lost weight due surgery   Exercise;  Used to walk at the gym  Now she is on HOLD   There are no preventive care reminders to display for this patient. Allergic, Dr told her not to take anymore  Declined shingrix due to prior reaction to Td  Mammogram in July  Dexa and medication followed  by [facility]    Colonoscopy x 77 yo in [place] (10/2015 with 10 year repeat)  Per her report  Last one was completed in [place]      Objective:     Vitals: BP 102/62   Ht 5\' 3"  (1.6 m)   Wt 134 lb (60.8 kg)   BMI 23.74 kg/m   Body mass index is 23.74 kg/m.  Advanced Directives 12/25/2017 11/15/2017  Does Patient Have a Medical Advance Directive? Yes Yes  Type of Advance Directive - [place];Living will  Copy of [place] in Chart? - No - copy requested    Tobacco Social History   Tobacco Use  Smoking Status Former Smoker  . Types: Cigarettes  Smokeless Tobacco Never Used  Tobacco Comment   smoked infrequently when she was young; quit 77 yo      Counseling given: Not Answered Comment: smoked infrequently when she was young; quit 77 yo    Clinical Intake:   Past Medical History:  Diagnosis Date  . Anxiety   . Arthritis   . Depression   . Hyperlipidemia   . Osteoporosis   . Temporal arteritis ([place])   . Thyroid disease    Past Surgical History:  Procedure Laterality Date  . ABDOMINAL HYSTERECTOMY    . BREAST EXCISIONAL BIOPSY Left   . BREAST SURGERY     biopsy  . COLONOSCOPY    . TONSILLECTOMY    . TOTAL  SHOULDER ARTHROPLASTY Right 11/22/2017   Procedure: RIGHT REVERSE TOTAL SHOULDER ARTHROPLASTY;  Surgeon: Tania Ade, MD;  Location: [place];  Service: Orthopedics;  Laterality: Right;   Family History  Problem Relation Age of Onset  . Heart disease Mother   . Kidney disease Mother   . Heart disease Father   . Kidney disease Father   . Breast cancer Maternal Aunt    Social History   Socioeconomic History  . Marital status: Married    Spouse name: Not on file  . Number of children: Not on file  . Years of education: Not on file  . Highest education level: Not on file  Social Needs  . Financial resource strain: Not on file  . Food insecurity - worry: Not on file  . Food insecurity - inability: Not on file  . Transportation needs - medical: Not on file  . Transportation needs - non-medical: Not on file  Occupational History  . Not on file  Tobacco Use  . Smoking status: Former Smoker    Types: Cigarettes  . Smokeless tobacco: Never Used  . Tobacco comment: smoked infrequently when she was young; quit 77 yo   Substance and Sexual Activity  . Alcohol use: No  .  Drug use: No  . Sexual activity: Yes    Partners: Male    Birth control/protection: Surgical  Other Topics Concern  . Not on file  Social History Narrative  . Not on file    Outpatient Encounter Medications as of 12/25/2017  Medication Sig  . alendronate (FOSAMAX) 70 MG tablet Take 70 mg by mouth every Friday.   Marland Kitchen atorvastatin (LIPITOR) 10 MG tablet TAKE ONE TABLET BY MOUTH ONCE DAILY (Patient taking differently: TAKE ONE TABLET BY MOUTH ONCE DAILY AT NIGHT)  . Calcium Carb-Cholecalciferol (CALCIUM 600+D3 PO) Take 1 tablet by mouth daily with lunch.  . cetirizine (ZYRTEC) 10 MG tablet Take 10 mg by mouth daily.  . Cholecalciferol (VITAMIN D3) 2000 units TABS Take 2,000 Units by mouth daily.  Marland Kitchen docusate sodium (COLACE) 100 MG capsule Take 1 capsule (100 mg total) by mouth 3 (three) times daily as needed.  . folic  acid (FOLVITE) 096 MCG tablet Take 800 mcg by mouth daily.  Marland Kitchen levothyroxine (SYNTHROID, LEVOTHROID) 112 MCG tablet Take 1 tablet by mouth for 5 days, and 1/2 tablet on the other 2 days. (Patient taking differently: Take 56-112 mcg by mouth See admin instructions. TAKE 0.5 TABLET (56 MCG) ON Saturday AND Sunday, TAKE 1 TABLET (112 MCG) ON ALL OTHER DAYS OF THE WEEK Monday TO Friday.)  . levothyroxine (SYNTHROID, LEVOTHROID) 112 MCG tablet TAKE 1 TABLET BY MOUTH ONCE DAILY BEFORE BREAKFAST  . meloxicam (MOBIC) 7.5 MG tablet Take 1 tablet (7.5 mg total) by mouth daily.  . methotrexate 2.5 MG tablet   . omeprazole (PRILOSEC) 20 MG capsule TAKE 1 CAPSULE BY MOUTH ONCE DAILY  . ondansetron (ZOFRAN) 4 MG tablet Take 1 tablet (4 mg total) by mouth every 8 (eight) hours as needed for nausea or vomiting. (Patient not taking: Reported on 12/25/2017)  . Probiotic Product (PROBIOTIC PO) Take 1 capsule by mouth daily.  . psyllium (REGULOID) 0.52 g capsule Take 1.04-1.56 g by mouth at bedtime.  . pyridOXINE (VITAMIN B-6) 100 MG tablet Take 100 mg by mouth daily.  . sertraline (ZOLOFT) 100 MG tablet Take 1.5 tablets (150 mg total) by mouth daily.  . temazepam (RESTORIL) 15 MG capsule TAKE ONE CAPSULE BY MOUTH ONCE DAILY AT BEDTIME AS NEEDED FOR SLEEP   No facility-administered encounter medications on file as of 12/25/2017.     Activities of Daily Living In your present state of health, do you have any difficulty performing the following activities: 12/25/2017 11/15/2017  Hearing? N N  Vision? N N  Difficulty concentrating or making decisions? N N  Walking or climbing stairs? Y Y  Dressing or bathing? N N  Doing errands, shopping? N N  Preparing Food and eating ? N -  Using the Toilet? N -  In the past six months, have you accidently leaked urine? N -  Do you have problems with loss of bowel control? N -  Managing your Medications? N -  Managing your Finances? N -  Housekeeping or managing your  Housekeeping? N -  Some recent data might be hidden    Patient Care Team: Martinique, Betty G, MD as PCP - General (Family Medicine)    Assessment:   This is a routine wellness examination for Jennifer Hull.  Exercise Activities and Dietary recommendations Current Exercise Habits: Home exercise routine(on hold )  Goals    . Patient Stated     Wants to feel better and be more mobile        Fall Risk  Fall Risk  12/25/2017  Falls in the past year? Yes  Number falls in past yr: 2 or more  Injury with Fall? Yes  Comment does not know why  Risk for fall due to : (No Data)  Risk for fall due to: Comment fell in around the bathroom in Oct  Follow up Education provided     Had 2 falls this year, the last one resulted in a fx wrist around Oct of 2018 per her report and she was in a cast x one month Alendronate every week  May repeat dexa next year  RH MD following    Room is on the 1st bathroom Walk in shower  Timed Get Up and Go performed: WNL Uses a cane on left side   Depression Screen PHQ 2/9 Scores 12/25/2017  PHQ - 2 Score 0     Cognitive Function Ad8 score reviewed for issues:  Issues making decisions:  Less interest in hobbies / activities:  Repeats questions, stories (family complaining):  Trouble using ordinary gadgets (microwave, computer, phone):  Forgets the month or year:   Mismanaging finances:   Remembering appts:  Daily problems with thinking and/or memory: Ad8 score is=0     MMSE - Mini Mental State Exam 12/25/2017  Not completed: (No Data)     Ad8 score within normal limits   Immunization History  Administered Date(s) Administered  . Influenza, High Dose Seasonal PF 06/27/2017  . Pneumococcal Conjugate-13 06/27/2017      Screening Tests Health Maintenance  Topic Date Due  . PNA vac Low Risk Adult (2 of 2 - PPSV23) 06/27/2018  . COLONOSCOPY  10/09/2025  . INFLUENZA VACCINE  Completed  . DEXA SCAN  Completed          Plan:        PCP Notes   Health Maintenance May want to repeat Dexa in 2020 per your Rh doctor  Jennifer Hull updated your colonoscopy completed 10/10/2015 for 10 repeat   Jennifer Hull also took you tetanus out as you are not a candidate   Mammogram due later this year  Had Eye exam 12/24/2017 and to return in 6 months   Abnormal Screens  none  Referrals  None  Patient concerns; Shoulder and back pain. To have MRI on Saturday   Nurse Concerns; As noted   Next PCP apt Seen Dr. Martinique today     I have personally reviewed and noted the following in the patient's chart:   . Medical and social history . Use of alcohol, tobacco or illicit drugs  . Current medications and supplements . Functional ability and status . Nutritional status . Physical activity . Advanced directives . List of other physicians . Hospitalizations, surgeries, and ER visits in previous 12 months . Vitals . Screenings to include cognitive, depression, and falls . Referrals and appointments  In addition, I have reviewed and discussed with patient certain preventive protocols, quality metrics, and best practice recommendations. A written personalized care plan for preventive services as well as general preventive health recommendations were provided to patient.     Wynetta Fines, RN  12/25/2017

## 2017-12-25 NOTE — Progress Notes (Signed)
HPI:   JenniferJolin G Hull is a 77 y.o. female, who is here today for 6 months follow up.   She was last seen on 06/27/2017  Since her last OV she has had right shoulder surgery.  She also had an episode of epistaxis, evaluated by ENT on 12/07/2017.  She has not had any other episodes since then, denies easy bruising, gum bleeding, blood in the stool, or gross hematuria.  She is also having right lower back pain radiated to posterior aspect of thigh.  She follows with orthopedics yesterday, according to patient, imaging was done and she has a lumbar MRI pending. She denies saddle anesthesia, urine/bowel incontinence.  Anxiety and depression: Currently she is on Sertraline 150 mg daily. Also taking Temazepam 15 mg at bedtime as needed, this medication also helps her with sleep.  She took Temazepam daily for a few days after she had right shoulder surgery.     Tolerating medication well, denies side effects. She is reporting symptoms as well controlled, denies depressed mood or suicidal thoughts.  She is still following with rheumatologist for polyarthralgia and temporal arteritis. She is not longer on Prednisone.   Review of Systems  Constitutional: Positive for fatigue. Negative for activity change, appetite change and fever.  Respiratory: Negative for shortness of breath and wheezing.   Cardiovascular: Negative for chest pain, palpitations and leg swelling.  Gastrointestinal: Negative for abdominal pain, nausea and vomiting.       No changes in bowel habits.  Genitourinary: Negative for decreased urine volume and hematuria.  Musculoskeletal: Positive for arthralgias and gait problem.  Skin: Negative for rash.  Neurological: Negative for syncope, weakness and headaches.  Psychiatric/Behavioral: Positive for sleep disturbance. Negative for confusion, hallucinations and suicidal ideas. The patient is nervous/anxious.      Current Outpatient Medications on File Prior to  Visit  Medication Sig Dispense Refill  . acetaminophen (TYLENOL) 500 MG tablet Take by mouth.    Marland Kitchen alendronate (FOSAMAX) 70 MG tablet Take 70 mg by mouth every Friday.     Marland Kitchen atorvastatin (LIPITOR) 10 MG tablet TAKE ONE TABLET BY MOUTH ONCE DAILY (Patient taking differently: TAKE ONE TABLET BY MOUTH ONCE DAILY AT NIGHT) 90 tablet 2  . Calcium Carb-Cholecalciferol (CALCIUM 600+D3 PO) Take 1 tablet by mouth daily with lunch.    . cetirizine (ZYRTEC) 10 MG tablet Take 10 mg by mouth daily.    . Cholecalciferol (VITAMIN D3) 2000 units TABS Take 2,000 Units by mouth daily.    Marland Kitchen docusate sodium (COLACE) 100 MG capsule Take 1 capsule (100 mg total) by mouth 3 (three) times daily as needed. 20 capsule 0  . folic acid (FOLVITE) 389 MCG tablet Take 800 mcg by mouth daily.    Marland Kitchen levothyroxine (SYNTHROID, LEVOTHROID) 112 MCG tablet Take 1 tablet by mouth for 5 days, and 1/2 tablet on the other 2 days. (Patient taking differently: Take 56-112 mcg by mouth See admin instructions. TAKE 0.5 TABLET (56 MCG) ON Saturday AND Sunday, TAKE 1 TABLET (112 MCG) ON ALL OTHER DAYS OF THE WEEK Monday TO Friday.) 90 tablet 0  . levothyroxine (SYNTHROID, LEVOTHROID) 112 MCG tablet TAKE 1 TABLET BY MOUTH ONCE DAILY BEFORE BREAKFAST 30 tablet 0  . meloxicam (MOBIC) 7.5 MG tablet Take 1 tablet (7.5 mg total) by mouth daily. 30 tablet 0  . methotrexate 2.5 MG tablet     . omeprazole (PRILOSEC) 20 MG capsule TAKE 1 CAPSULE BY MOUTH ONCE DAILY 30 capsule 3  .  Probiotic Product (PROBIOTIC PO) Take 1 capsule by mouth daily.    . psyllium (REGULOID) 0.52 g capsule Take 1.04-1.56 g by mouth at bedtime.    . pyridOXINE (VITAMIN B-6) 100 MG tablet Take 100 mg by mouth daily.    . temazepam (RESTORIL) 15 MG capsule TAKE ONE CAPSULE BY MOUTH ONCE DAILY AT BEDTIME AS NEEDED FOR SLEEP 30 capsule 3  . ondansetron (ZOFRAN) 4 MG tablet Take 1 tablet (4 mg total) by mouth every 8 (eight) hours as needed for nausea or vomiting. (Patient not taking:  Reported on 12/25/2017) 30 tablet 1   No current facility-administered medications on file prior to visit.      Past Medical History:  Diagnosis Date  . Anxiety   . Arthritis   . Depression   . Hyperlipidemia   . Osteoporosis   . Temporal arteritis ([place])   . Thyroid disease    Allergies  Allergen Reactions  . Tdap [Diphth-Acell Pertussis-Tetanus] Other (See Comments)    SEVERE ALLERGIC REACTIONS (NECK STIFFNESS/HIGH FEVER/ETC)    Social History   Socioeconomic History  . Marital status: Married    Spouse name: Not on file  . Number of children: Not on file  . Years of education: Not on file  . Highest education level: Not on file  Occupational History  . Not on file  Social Needs  . Financial resource strain: Not on file  . Food insecurity:    Worry: Not on file    Inability: Not on file  . Transportation needs:    Medical: Not on file    Non-medical: Not on file  Tobacco Use  . Smoking status: Former Smoker    Types: Cigarettes  . Smokeless tobacco: Never Used  . Tobacco comment: smoked infrequently when she was young; quit 77 yo   Substance and Sexual Activity  . Alcohol use: No  . Drug use: No  . Sexual activity: Yes    Partners: Male    Birth control/protection: Surgical  Lifestyle  . Physical activity:    Days per week: Not on file    Minutes per session: Not on file  . Stress: Not on file  Relationships  . Social connections:    Talks on phone: Not on file    Gets together: Not on file    Attends religious service: Not on file    Active member of club or organization: Not on file    Attends meetings of clubs or organizations: Not on file    Relationship status: Not on file  Other Topics Concern  . Not on file  Social History Narrative  . Not on file    Vitals:   12/25/17 1429  BP: 102/62  Pulse: 86  Resp: 12  Temp: 98.1 F (36.7 C)  SpO2: 98%   Body mass index is 23.74 kg/m.   Physical Exam  Nursing note and vitals  reviewed. Constitutional: She is oriented to person, place, and time. She appears well-developed and well-nourished. No distress.  HENT:  Head: Normocephalic and atraumatic.  Mouth/Throat: Oropharynx is clear and moist and mucous membranes are normal.  Eyes: Pupils are equal, round, and reactive to light. Conjunctivae are normal.  Cardiovascular: Normal rate and regular rhythm.  No murmur heard. Pulses:      Dorsalis pedis pulses are 2+ on the right side, and 2+ on the left side.  Respiratory: Effort normal and breath sounds normal. No respiratory distress.  GI: Soft. She exhibits no mass. There  is no hepatomegaly. There is no tenderness.  Musculoskeletal: She exhibits no edema.  Lymphadenopathy:    She has no cervical adenopathy.  Neurological: She is alert and oriented to person, place, and time. She has normal strength.  Antalgic gait assisted with a cane.  Skin: Skin is warm. No rash noted. No erythema.  Psychiatric: She has a normal mood and affect.  Well groomed, good eye contact.      ASSESSMENT AND PLAN:   Jennifer Hull was seen today for 6 months follow-up.  No orders of the defined types were placed in this encounter.   Major depressive disorder, recurrent episode, moderate (HCC) Well-controlled. No changes in Sertraline dose. Follow-up in 6 months, before if needed.  Generalized anxiety disorder Stable. No changes in Temazepam 15 mg or Sertraline 150 mg. Follow-up in 6 months.  Insomnia Aggravated by recent shoulder surgery and chronic arthralgias. Temazepam is still helping, no changes in current management. Good sleep hygiene is also recommended. Follow-up in 6 months.   Epistaxis, recurrent She has not had any episodes since 12/07/2017. We discussed side effects of NSAIDs as well as SSRIs. She will continue following with ENT as needed.      -Ms. Taraji G Weatherspoon was advised to return sooner than planned today if new concerns  arise.       Betty G. Martinique, MD  [facility]. [place] office.

## 2017-12-25 NOTE — Patient Instructions (Addendum)
Jennifer Hull , Thank you for taking time to come for your Medicare Wellness Visit. I appreciate your ongoing commitment to your health goals. Please review the following plan we discussed and let me know if I can assist you in the future.    Jennifer want to repeat Dexa in 2020 per your Rh doctor  Jennifer Hull updated your colonoscopy completed 10/10/2015 for 10 repeat   Jennifer Hull also took you tetanus out as you are not a Hull     These are the goals we discussed: Goals    . Patient Stated     Wants to feel better and be more mobile        This is a list of the screening recommended for you and due dates:  Health Maintenance  Topic Date Due  . Tetanus Vaccine  04/17/1960  . Pneumonia vaccines (2 of 2 - PPSV23) 06/27/2018  . Colon Cancer Screening  10/09/2024  . Flu Shot  Completed  . DEXA scan (bone density measurement)  Completed      Fall Prevention in the Home Falls can cause injuries. They can happen to people of all ages. There are many things you can do to make your home safe and to help prevent falls. What can I do on the outside of my home?  Regularly fix the edges of walkways and driveways and fix any cracks.  Remove anything that might make you trip as you walk through a door, such as a raised step or threshold.  Trim any bushes or trees on the path to your home.  Use bright outdoor lighting.  Clear any walking paths of anything that might make someone trip, such as rocks or tools.  Regularly check to see if handrails are loose or broken. Make sure that both sides of any steps have handrails.  Any raised decks and porches should have guardrails on the edges.  Have any leaves, snow, or ice cleared regularly.  Use sand or salt on walking paths during winter.  Clean up any spills in your garage right away. This includes oil or grease spills. What can I do in the bathroom?  Use night lights.  Install grab bars by the toilet and in the tub and shower. Do not use  towel bars as grab bars.  Use non-skid mats or decals in the tub or shower.  If you need to sit down in the shower, use a plastic, non-slip stool.  Keep the floor dry. Clean up any water that spills on the floor as soon as it happens.  Remove soap buildup in the tub or shower regularly.  Attach bath mats securely with double-sided non-slip rug tape.  Do not have throw rugs and other things on the floor that can make you trip. What can I do in the bedroom?  Use night lights.  Make sure that you have a light by your bed that is easy to reach.  Do not use any sheets or blankets that are too big for your bed. They should not hang down onto the floor.  Have a firm chair that has side arms. You can use this for support while you get dressed.  Do not have throw rugs and other things on the floor that can make you trip. What can I do in the kitchen?  Clean up any spills right away.  Avoid walking on wet floors.  Keep items that you use a lot in easy-to-reach places.  If you need to reach something above  you, use a strong step stool that has a grab bar.  Keep electrical cords out of the way.  Do not use floor polish or wax that makes floors slippery. If you must use wax, use non-skid floor wax.  Do not have throw rugs and other things on the floor that can make you trip. What can I do with my stairs?  Do not leave any items on the stairs.  Make sure that there are handrails on both sides of the stairs and use them. Fix handrails that are broken or loose. Make sure that handrails are as long as the stairways.  Check any carpeting to make sure that it is firmly attached to the stairs. Fix any carpet that is loose or worn.  Avoid having throw rugs at the top or bottom of the stairs. If you do have throw rugs, attach them to the floor with carpet tape.  Make sure that you have a light switch at the top of the stairs and the bottom of the stairs. If you do not have them, ask  someone to add them for you. What else can I do to help prevent falls?  Wear shoes that: ? Do not have high heels. ? Have rubber bottoms. ? Are comfortable and fit you well. ? Are closed at the toe. Do not wear sandals.  If you use a stepladder: ? Make sure that it is fully opened. Do not climb a closed stepladder. ? Make sure that both sides of the stepladder are locked into place. ? Ask someone to hold it for you, if possible.  Clearly mark and make sure that you can see: ? Any grab bars or handrails. ? First and last steps. ? Where the edge of each step is.  Use tools that help you move around (mobility aids) if they are needed. These include: ? Canes. ? Walkers. ? Scooters. ? Crutches.  Turn on the lights when you go into a dark area. Replace any light bulbs as soon as they burn out.  Set up your furniture so you have a clear path. Avoid moving your furniture around.  If any of your floors are uneven, fix them.  If there are any pets around you, be aware of where they are.  Review your medicines with your doctor. Some medicines can make you feel dizzy. This can increase your chance of falling. Ask your doctor what other things that you can do to help prevent falls. This information is not intended to replace advice given to you by your health care provider. Make sure you discuss any questions you have with your health care provider. Document Released: 07/22/2009 Document Revised: 03/02/2016 Document Reviewed: 10/30/2014 Jennifer Hull  2018 [place] Maintenance, Female Adopting a healthy lifestyle and getting preventive care can go a long way to promote health and wellness. Talk with your health care provider about what schedule of regular examinations is right for you. This is a good chance for you to check in with your provider about disease prevention and staying healthy. In between checkups, there are plenty of things you can do  on your own. Experts have done a lot of research about which lifestyle changes and preventive measures are most likely to keep you healthy. Ask your health care provider for more information. Weight and diet Eat a healthy diet  Be sure to include plenty of vegetables, fruits, low-fat dairy products, and lean protein.  Do not eat a lot of  foods high in solid fats, added sugars, or salt.  Get regular exercise. This is one of the most important things you can do for your health. ? Most adults should exercise for at least 150 minutes each week. The exercise should increase your heart rate and make you sweat (moderate-intensity exercise). ? Most adults should also do strengthening exercises at least twice a week. This is in addition to the moderate-intensity exercise.  Maintain a healthy weight  Body mass index (BMI) is a measurement that can be used to identify possible weight problems. It estimates body fat based on height and weight. Your health care provider can help determine your BMI and help you achieve or maintain a healthy weight.  For females 43 years of age and older: ? A BMI below 18.5 is considered underweight. ? A BMI of 18.5 to 24.9 is normal. ? A BMI of 25 to 29.9 is considered overweight. ? A BMI of 30 and above is considered obese.  Watch levels of cholesterol and blood lipids  You should start having your blood tested for lipids and cholesterol at 76 years of age, then have this test every 5 years.  You Jennifer need to have your cholesterol levels checked more often if: ? Your lipid or cholesterol levels are high. ? You are older than 75 years of age. ? You are at high risk for heart disease.  Cancer screening Lung Cancer  Lung cancer screening is recommended for adults 101-16 years old who are at high risk for lung cancer because of a history of smoking.  A yearly low-dose CT scan of the lungs is recommended for people who: ? Currently smoke. ? Have quit within the  past 15 years. ? Have at least a 30-pack-year history of smoking. A pack year is smoking an average of one pack of cigarettes a day for 1 year.  Yearly screening should continue until it has been 15 years since you quit.  Yearly screening should stop if you develop a health problem that would prevent you from having lung cancer treatment.  Breast Cancer  Practice breast self-awareness. This means understanding how your breasts normally appear and feel.  It also means doing regular breast self-exams. Let your health care provider know about any changes, no matter how small.  If you are in your 20s or 30s, you should have a clinical breast exam (CBE) by a health care provider every 1-3 years as part of a regular health exam.  If you are 80 or older, have a CBE every year. Also consider having a breast X-ray (mammogram) every year.  If you have a family history of breast cancer, talk to your health care provider about genetic screening.  If you are at high risk for breast cancer, talk to your health care provider about having an MRI and a mammogram every year.  Breast cancer gene (BRCA) assessment is recommended for women who have family members with BRCA-related cancers. BRCA-related cancers include: ? Breast. ? Ovarian. ? Tubal. ? Peritoneal cancers.  Results of the assessment will determine the need for genetic counseling and BRCA1 and BRCA2 testing.  Cervical Cancer Your health care provider Jennifer recommend that you be screened regularly for cancer of the pelvic organs (ovaries, uterus, and vagina). This screening involves a pelvic examination, including checking for microscopic changes to the surface of your cervix (Pap test). You Jennifer be encouraged to have this screening done every 3 years, beginning at age 70.  For women ages 14-65,  health care providers Jennifer recommend pelvic exams and Pap testing every 3 years, or they Jennifer recommend the Pap and pelvic exam, combined with testing for  human papilloma virus (HPV), every 5 years. Some types of HPV increase your risk of cervical cancer. Testing for HPV Jennifer also be done on women of any age with unclear Pap test results.  Other health care providers Jennifer not recommend any screening for nonpregnant women who are considered low risk for pelvic cancer and who do not have symptoms. Ask your health care provider if a screening pelvic exam is right for you.  If you have had past treatment for cervical cancer or a condition that could lead to cancer, you need Pap tests and screening for cancer for at least 20 years after your treatment. If Pap tests have been discontinued, your risk factors (such as having a new sexual partner) need to be reassessed to determine if screening should resume. Some women have medical problems that increase the chance of getting cervical cancer. In these cases, your health care provider Jennifer recommend more frequent screening and Pap tests.  Colorectal Cancer  This type of cancer can be detected and often prevented.  Routine colorectal cancer screening usually begins at 76 years of age and continues through 76 years of age.  Your health care provider Jennifer recommend screening at an earlier age if you have risk factors for colon cancer.  Your health care provider Jennifer also recommend using home test kits to check for hidden blood in the stool.  A small camera at the end of a tube can be used to examine your colon directly (sigmoidoscopy or colonoscopy). This is done to check for the earliest forms of colorectal cancer.  Routine screening usually begins at age 99.  Direct examination of the colon should be repeated every 5-10 years through 76 years of age. However, you Jennifer need to be screened more often if early forms of precancerous polyps or small growths are found.  Skin Cancer  Check your skin from head to toe regularly.  Tell your health care provider about any new moles or changes in moles, especially if  there is a change in a mole's shape or color.  Also tell your health care provider if you have a mole that is larger than the size of a pencil eraser.  Always use sunscreen. Apply sunscreen liberally and repeatedly throughout the day.  Protect yourself by wearing long sleeves, pants, a wide-brimmed hat, and sunglasses whenever you are outside.  Heart disease, diabetes, and high blood pressure  High blood pressure causes heart disease and increases the risk of stroke. High blood pressure is more likely to develop in: ? People who have blood pressure in the high end of the normal range (130-139/85-89 mm Hg). ? People who are overweight or obese. ? People who are African American.  If you are 3-32 years of age, have your blood pressure checked every 3-5 years. If you are 19 years of age or older, have your blood pressure checked every year. You should have your blood pressure measured twice-once when you are at a hospital or clinic, and once when you are not at a hospital or clinic. Record the average of the two measurements. To check your blood pressure when you are not at a hospital or clinic, you can use: ? An automated blood pressure machine at a pharmacy. ? A home blood pressure monitor.  If you are between 71 years and 55 years old,  ask your health care provider if you should take aspirin to prevent strokes.  Have regular diabetes screenings. This involves taking a blood sample to check your fasting blood sugar level. ? If you are at a normal weight and have a low risk for diabetes, have this test once every three years after 76 years of age. ? If you are overweight and have a high risk for diabetes, consider being tested at a younger age or more often. Preventing infection Hepatitis B  If you have a higher risk for hepatitis B, you should be screened for this virus. You are considered at high risk for hepatitis B if: ? You were born in a country where hepatitis B is common. Ask your  health care provider which countries are considered high risk. ? Your parents were born in a high-risk country, and you have not been immunized against hepatitis B (hepatitis B vaccine). ? You have HIV or AIDS. ? You use needles to inject street drugs. ? You live with someone who has hepatitis B. ? You have had sex with someone who has hepatitis B. ? You get hemodialysis treatment. ? You take certain medicines for conditions, including cancer, organ transplantation, and autoimmune conditions.  Hepatitis C  Blood testing is recommended for: ? Everyone born from [birth year] through 1964. ? Anyone with known risk factors for hepatitis C.  Sexually transmitted infections (STIs)  You should be screened for sexually transmitted infections (STIs) including gonorrhea and chlamydia if: ? You are sexually active and are younger than 76 years of age. ? You are older than 76 years of age and your health care provider tells you that you are at risk for this type of infection. ? Your sexual activity has changed since you were last screened and you are at an increased risk for chlamydia or gonorrhea. Ask your health care provider if you are at risk.  If you do not have HIV, but are at risk, it Jennifer be recommended that you take a prescription medicine daily to prevent HIV infection. This is called pre-exposure prophylaxis (PrEP). You are considered at risk if: ? You are sexually active and do not regularly use condoms or know the HIV status of your partner(s). ? You take drugs by injection. ? You are sexually active with a partner who has HIV.  Talk with your health care provider about whether you are at high risk of being infected with HIV. If you choose to begin PrEP, you should first be tested for HIV. You should then be tested every 3 months for as long as you are taking PrEP. Pregnancy  If you are premenopausal and you Jennifer become pregnant, ask your health care provider about preconception  counseling.  If you Jennifer become pregnant, take 400 to 800 micrograms (mcg) of folic acid every day.  If you want to prevent pregnancy, talk to your health care provider about birth control (contraception). Osteoporosis and menopause  Osteoporosis is a disease in which the bones lose minerals and strength with aging. This can result in serious bone fractures. Your risk for osteoporosis can be identified using a bone density scan.  If you are 18 years of age or older, or if you are at risk for osteoporosis and fractures, ask your health care provider if you should be screened.  Ask your health care provider whether you should take a calcium or vitamin D supplement to lower your risk for osteoporosis.  Menopause Jennifer have certain physical symptoms and risks.  Hormone replacement therapy Jennifer reduce some of these symptoms and risks. Talk to your health care provider about whether hormone replacement therapy is right for you. Follow these instructions at home:  Schedule regular health, dental, and eye exams.  Stay current with your immunizations.  Do not use any tobacco products including cigarettes, chewing tobacco, or electronic cigarettes.  If you are pregnant, do not drink alcohol.  If you are breastfeeding, limit how much and how often you drink alcohol.  Limit alcohol intake to no more than 1 drink per day for nonpregnant women. One drink equals 12 ounces of beer, 5 ounces of wine, or 1 ounces of hard liquor.  Do not use street drugs.  Do not share needles.  Ask your health care provider for help if you need support or information about quitting drugs.  Tell your health care provider if you often feel depressed.  Tell your health care provider if you have ever been abused or do not feel safe at home. This information is not intended to replace advice given to you by your health care provider. Make sure you discuss any questions you have with your health care provider. Document  Released: 04/10/2011 Document Revised: 03/02/2016 Document Reviewed: 06/29/2015 Jennifer Hull  [employer].

## 2017-12-25 NOTE — Progress Notes (Signed)
I have reviewed documentation from this visit and I agree with recommendations given.  Betty G. Jordan, MD  [facility]. [place] office.   

## 2017-12-25 NOTE — Assessment & Plan Note (Signed)
Stable. No changes in Temazepam 15 mg or Sertraline 150 mg. Follow-up in 6 months.

## 2017-12-25 NOTE — Patient Instructions (Addendum)
A few things to remember from today's visit:   Insomnia, unspecified type  Generalized anxiety disorder   Please be sure medication list is accurate. If a new problem present, please set up appointment sooner than planned today.

## 2017-12-25 NOTE — Assessment & Plan Note (Signed)
She has not had any episodes since 12/07/2017. We discussed side effects of NSAIDs as well as SSRIs. She will continue following with ENT as needed.

## 2017-12-29 DIAGNOSIS — M545 Low back pain: Secondary | ICD-10-CM | POA: Diagnosis not present

## 2018-01-14 DIAGNOSIS — M5416 Radiculopathy, lumbar region: Secondary | ICD-10-CM | POA: Diagnosis not present

## 2018-01-15 DIAGNOSIS — M25511 Pain in right shoulder: Secondary | ICD-10-CM | POA: Diagnosis not present

## 2018-01-15 DIAGNOSIS — Z7952 Long term (current) use of systemic steroids: Secondary | ICD-10-CM | POA: Diagnosis not present

## 2018-01-15 DIAGNOSIS — Z6822 Body mass index (BMI) 22.0-22.9, adult: Secondary | ICD-10-CM | POA: Diagnosis not present

## 2018-01-15 DIAGNOSIS — M5136 Other intervertebral disc degeneration, lumbar region: Secondary | ICD-10-CM | POA: Diagnosis not present

## 2018-01-15 DIAGNOSIS — G8929 Other chronic pain: Secondary | ICD-10-CM | POA: Diagnosis not present

## 2018-01-15 DIAGNOSIS — M316 Other giant cell arteritis: Secondary | ICD-10-CM | POA: Diagnosis not present

## 2018-01-15 DIAGNOSIS — M8589 Other specified disorders of bone density and structure, multiple sites: Secondary | ICD-10-CM | POA: Diagnosis not present

## 2018-01-15 DIAGNOSIS — R928 Other abnormal and inconclusive findings on diagnostic imaging of breast: Secondary | ICD-10-CM | POA: Diagnosis not present

## 2018-01-15 DIAGNOSIS — R768 Other specified abnormal immunological findings in serum: Secondary | ICD-10-CM | POA: Diagnosis not present

## 2018-01-16 DIAGNOSIS — M5416 Radiculopathy, lumbar region: Secondary | ICD-10-CM | POA: Diagnosis not present

## 2018-01-16 DIAGNOSIS — M545 Low back pain: Secondary | ICD-10-CM | POA: Diagnosis not present

## 2018-01-21 DIAGNOSIS — M48061 Spinal stenosis, lumbar region without neurogenic claudication: Secondary | ICD-10-CM | POA: Diagnosis not present

## 2018-01-23 DIAGNOSIS — Z96611 Presence of right artificial shoulder joint: Secondary | ICD-10-CM | POA: Diagnosis not present

## 2018-01-23 DIAGNOSIS — Z471 Aftercare following joint replacement surgery: Secondary | ICD-10-CM | POA: Diagnosis not present

## 2018-01-23 DIAGNOSIS — M25511 Pain in right shoulder: Secondary | ICD-10-CM | POA: Diagnosis not present

## 2018-01-29 DIAGNOSIS — M25511 Pain in right shoulder: Secondary | ICD-10-CM | POA: Diagnosis not present

## 2018-01-29 DIAGNOSIS — Z96611 Presence of right artificial shoulder joint: Secondary | ICD-10-CM | POA: Diagnosis not present

## 2018-01-29 DIAGNOSIS — M25611 Stiffness of right shoulder, not elsewhere classified: Secondary | ICD-10-CM | POA: Diagnosis not present

## 2018-01-31 DIAGNOSIS — M48061 Spinal stenosis, lumbar region without neurogenic claudication: Secondary | ICD-10-CM | POA: Diagnosis not present

## 2018-02-05 DIAGNOSIS — M545 Low back pain: Secondary | ICD-10-CM | POA: Diagnosis not present

## 2018-02-05 DIAGNOSIS — M5416 Radiculopathy, lumbar region: Secondary | ICD-10-CM | POA: Diagnosis not present

## 2018-02-12 DIAGNOSIS — Z96611 Presence of right artificial shoulder joint: Secondary | ICD-10-CM | POA: Diagnosis not present

## 2018-02-12 DIAGNOSIS — M25511 Pain in right shoulder: Secondary | ICD-10-CM | POA: Diagnosis not present

## 2018-02-12 DIAGNOSIS — M25611 Stiffness of right shoulder, not elsewhere classified: Secondary | ICD-10-CM | POA: Diagnosis not present

## 2018-02-14 DIAGNOSIS — Z9181 History of falling: Secondary | ICD-10-CM | POA: Diagnosis not present

## 2018-02-14 DIAGNOSIS — Z961 Presence of intraocular lens: Secondary | ICD-10-CM | POA: Diagnosis not present

## 2018-02-14 DIAGNOSIS — H10413 Chronic giant papillary conjunctivitis, bilateral: Secondary | ICD-10-CM | POA: Diagnosis not present

## 2018-02-14 DIAGNOSIS — M25551 Pain in right hip: Secondary | ICD-10-CM | POA: Diagnosis not present

## 2018-02-14 DIAGNOSIS — R262 Difficulty in walking, not elsewhere classified: Secondary | ICD-10-CM | POA: Diagnosis not present

## 2018-02-14 DIAGNOSIS — H04123 Dry eye syndrome of bilateral lacrimal glands: Secondary | ICD-10-CM | POA: Diagnosis not present

## 2018-02-15 DIAGNOSIS — M48061 Spinal stenosis, lumbar region without neurogenic claudication: Secondary | ICD-10-CM | POA: Diagnosis not present

## 2018-02-17 ENCOUNTER — Other Ambulatory Visit: Payer: Self-pay | Admitting: Family Medicine

## 2018-02-17 DIAGNOSIS — E039 Hypothyroidism, unspecified: Secondary | ICD-10-CM

## 2018-03-11 DIAGNOSIS — M5416 Radiculopathy, lumbar region: Secondary | ICD-10-CM | POA: Diagnosis not present

## 2018-03-11 DIAGNOSIS — M545 Low back pain: Secondary | ICD-10-CM | POA: Diagnosis not present

## 2018-03-14 DIAGNOSIS — M25611 Stiffness of right shoulder, not elsewhere classified: Secondary | ICD-10-CM | POA: Diagnosis not present

## 2018-03-14 DIAGNOSIS — M25511 Pain in right shoulder: Secondary | ICD-10-CM | POA: Diagnosis not present

## 2018-03-14 DIAGNOSIS — Z96611 Presence of right artificial shoulder joint: Secondary | ICD-10-CM | POA: Diagnosis not present

## 2018-03-18 DIAGNOSIS — M5416 Radiculopathy, lumbar region: Secondary | ICD-10-CM | POA: Diagnosis not present

## 2018-03-18 DIAGNOSIS — M545 Low back pain: Secondary | ICD-10-CM | POA: Diagnosis not present

## 2018-03-20 DIAGNOSIS — Z96611 Presence of right artificial shoulder joint: Secondary | ICD-10-CM | POA: Diagnosis not present

## 2018-03-20 DIAGNOSIS — G5602 Carpal tunnel syndrome, left upper limb: Secondary | ICD-10-CM | POA: Diagnosis not present

## 2018-03-20 DIAGNOSIS — M25561 Pain in right knee: Secondary | ICD-10-CM | POA: Diagnosis not present

## 2018-03-20 DIAGNOSIS — G5601 Carpal tunnel syndrome, right upper limb: Secondary | ICD-10-CM | POA: Diagnosis not present

## 2018-03-21 DIAGNOSIS — M48062 Spinal stenosis, lumbar region with neurogenic claudication: Secondary | ICD-10-CM | POA: Diagnosis not present

## 2018-03-21 DIAGNOSIS — M48061 Spinal stenosis, lumbar region without neurogenic claudication: Secondary | ICD-10-CM | POA: Diagnosis not present

## 2018-03-24 ENCOUNTER — Other Ambulatory Visit: Payer: Self-pay | Admitting: Family Medicine

## 2018-03-24 DIAGNOSIS — E039 Hypothyroidism, unspecified: Secondary | ICD-10-CM

## 2018-04-12 ENCOUNTER — Other Ambulatory Visit: Payer: Self-pay | Admitting: Obstetrics and Gynecology

## 2018-04-12 DIAGNOSIS — Z1231 Encounter for screening mammogram for malignant neoplasm of breast: Secondary | ICD-10-CM

## 2018-04-19 DIAGNOSIS — G5602 Carpal tunnel syndrome, left upper limb: Secondary | ICD-10-CM | POA: Diagnosis not present

## 2018-04-19 DIAGNOSIS — Z96611 Presence of right artificial shoulder joint: Secondary | ICD-10-CM | POA: Diagnosis not present

## 2018-04-19 DIAGNOSIS — Z471 Aftercare following joint replacement surgery: Secondary | ICD-10-CM | POA: Diagnosis not present

## 2018-04-19 DIAGNOSIS — G5601 Carpal tunnel syndrome, right upper limb: Secondary | ICD-10-CM | POA: Diagnosis not present

## 2018-04-23 DIAGNOSIS — M2550 Pain in unspecified joint: Secondary | ICD-10-CM | POA: Diagnosis not present

## 2018-04-23 DIAGNOSIS — M316 Other giant cell arteritis: Secondary | ICD-10-CM | POA: Diagnosis not present

## 2018-04-23 DIAGNOSIS — M5136 Other intervertebral disc degeneration, lumbar region: Secondary | ICD-10-CM | POA: Diagnosis not present

## 2018-04-23 DIAGNOSIS — M25511 Pain in right shoulder: Secondary | ICD-10-CM | POA: Diagnosis not present

## 2018-04-23 DIAGNOSIS — Z6822 Body mass index (BMI) 22.0-22.9, adult: Secondary | ICD-10-CM | POA: Diagnosis not present

## 2018-04-23 DIAGNOSIS — G8929 Other chronic pain: Secondary | ICD-10-CM | POA: Diagnosis not present

## 2018-04-23 DIAGNOSIS — R768 Other specified abnormal immunological findings in serum: Secondary | ICD-10-CM | POA: Diagnosis not present

## 2018-04-23 DIAGNOSIS — Z7952 Long term (current) use of systemic steroids: Secondary | ICD-10-CM | POA: Diagnosis not present

## 2018-04-23 DIAGNOSIS — M8589 Other specified disorders of bone density and structure, multiple sites: Secondary | ICD-10-CM | POA: Diagnosis not present

## 2018-04-23 DIAGNOSIS — R928 Other abnormal and inconclusive findings on diagnostic imaging of breast: Secondary | ICD-10-CM | POA: Diagnosis not present

## 2018-05-27 ENCOUNTER — Ambulatory Visit
Admission: RE | Admit: 2018-05-27 | Discharge: 2018-05-27 | Disposition: A | Discharge status: Home / Self Care | Payer: Medicare Other | Source: Ambulatory Visit | Attending: Obstetrics and Gynecology | Admitting: Obstetrics and Gynecology

## 2018-05-27 DIAGNOSIS — Z1231 Encounter for screening mammogram for malignant neoplasm of breast: Secondary | ICD-10-CM | POA: Diagnosis not present

## 2018-05-29 ENCOUNTER — Encounter: Payer: Self-pay | Admitting: Family Medicine

## 2018-05-31 ENCOUNTER — Encounter: Payer: Self-pay | Admitting: Family Medicine

## 2018-05-31 ENCOUNTER — Ambulatory Visit (INDEPENDENT_AMBULATORY_CARE_PROVIDER_SITE_OTHER): Payer: Medicare Other | Admitting: Family Medicine

## 2018-05-31 ENCOUNTER — Other Ambulatory Visit: Payer: Self-pay | Admitting: Family Medicine

## 2018-05-31 VITALS — BP 104/70 | HR 68 | Temp 98.1°F | Resp 12 | Ht 63.0 in | Wt 129.5 lb

## 2018-05-31 DIAGNOSIS — K219 Gastro-esophageal reflux disease without esophagitis: Secondary | ICD-10-CM | POA: Diagnosis not present

## 2018-05-31 DIAGNOSIS — E039 Hypothyroidism, unspecified: Secondary | ICD-10-CM

## 2018-05-31 DIAGNOSIS — G4700 Insomnia, unspecified: Secondary | ICD-10-CM

## 2018-05-31 DIAGNOSIS — R110 Nausea: Secondary | ICD-10-CM | POA: Diagnosis not present

## 2018-05-31 DIAGNOSIS — R1010 Upper abdominal pain, unspecified: Secondary | ICD-10-CM | POA: Diagnosis not present

## 2018-05-31 DIAGNOSIS — M7712 Lateral epicondylitis, left elbow: Secondary | ICD-10-CM

## 2018-05-31 DIAGNOSIS — M7711 Lateral epicondylitis, right elbow: Secondary | ICD-10-CM

## 2018-05-31 LAB — BASIC METABOLIC PANEL
BUN: 19 mg/dL (ref 6–23)
CO2: 28 mEq/L (ref 19–32)
CREATININE: 0.71 mg/dL (ref 0.40–1.20)
Calcium: 10.1 mg/dL (ref 8.4–10.5)
Chloride: 102 mEq/L (ref 96–112)
GFR: 85.04 mL/min (ref >60.00)
Glucose, Bld: 89 mg/dL (ref 70–99)
Potassium: 4 mEq/L (ref 3.5–5.1)
Sodium: 138 mEq/L (ref 135–145)

## 2018-05-31 LAB — TSH: TSH: 2.71 u[IU]/mL (ref 0.35–4.50)

## 2018-05-31 MED ORDER — ONDANSETRON HCL 4 MG PO TABS: 4.0000 mg | ORAL_TABLET | Freq: Three times a day (TID) | ORAL | 0 refills | Status: AC | PRN

## 2018-05-31 MED ORDER — DICLOFENAC SODIUM 1 % TD GEL: 4.0000 g | Freq: Four times a day (QID) | TRANSDERMAL | 3 refills | Status: DC

## 2018-05-31 MED ORDER — PANTOPRAZOLE SODIUM 40 MG PO TBEC: 40.0000 mg | DELAYED_RELEASE_TABLET | Freq: Every day | ORAL | 3 refills | Status: DC

## 2018-05-31 NOTE — Progress Notes (Signed)
ACUTE VISIT   HPI:  Chief Complaint  Patient presents with  . Stomach issues    sx started 2 weeks ago  . Nausea    Jennifer Hull is a 77 y.o. female, who is here today complaining of 2 weeks of epigastric abdominal pain. She cannot describe type of pain, discomfort, 6-7/10 radiating, and intermittent. It is exacerbated by food intake. Intense nausea but no vomiting.  She has not noted chills, fever, or unusual fatigue.  History of GERD, she is on omeprazole 20 mg.  Recently she had to husband's omeprazole 40 mg but did not help.. She has tried OTC Zantac and Alka-Seltzer. Negative for heartburn, acid reflux, or chest pain. Mild burping.  + Bloating sensation.  No changes in bowel habits, history of constipation. Last bowel movement yesterday. She has not noted blood in the stool or melena. Colonoscopy about 4 years ago, polypectomy x1.   Hypothyroidism: Currently she is on levothyroxine 112 mcg daily. She has not noticed changes in bowel habits, heat intolerance, or tremor. She is usually cold nature.    Lab Results  Component Value Date   TSH 2.73 06/28/2017   Bilateral elbow pain that is started about 3 weeks ago and getting worse. Pain is localized on lateral epicondyle, exacerbated by movement and palpation. She has not identified alleviating factors. Pain is constant, moderate with movement. No limitation of range of motion. No history of trauma or unusual activity.  She has history of RA, she follows with rheumatologist periodically.   Review of Systems  Constitutional: Positive for fatigue. Negative for activity change, appetite change and fever.  HENT: Negative for mouth sores, sore throat and trouble swallowing.   Respiratory: Negative for cough, shortness of breath and wheezing.   Gastrointestinal: Positive for abdominal pain, constipation and nausea. Negative for abdominal distention, blood in stool and vomiting.       No changes in  bowel habits.  Endocrine: Negative for cold intolerance and heat intolerance.  Genitourinary: Negative for decreased urine volume, dysuria and hematuria.  Musculoskeletal: Positive for arthralgias and joint swelling. Negative for neck pain.  Skin: Negative for rash.  Allergic/Immunologic: Negative for food allergies.  Neurological: Negative for weakness and numbness.  Psychiatric/Behavioral: Negative for confusion. The patient is nervous/anxious.       Current Outpatient Medications on File Prior to Visit  Medication Sig Dispense Refill  . acetaminophen (TYLENOL) 500 MG tablet Take by mouth.    Marland Kitchen atorvastatin (LIPITOR) 10 MG tablet TAKE ONE TABLET BY MOUTH ONCE DAILY (Patient taking differently: TAKE ONE TABLET BY MOUTH ONCE DAILY AT NIGHT) 90 tablet 2  . Calcium Carb-Cholecalciferol (CALCIUM 600+D3 PO) Take 1 tablet by mouth daily with lunch.    . cetirizine (ZYRTEC) 10 MG tablet Take 10 mg by mouth daily.    . Cholecalciferol (VITAMIN D3) 2000 units TABS Take 2,000 Units by mouth daily.    Marland Kitchen docusate sodium (COLACE) 100 MG capsule Take 1 capsule (100 mg total) by mouth 3 (three) times daily as needed. 20 capsule 0  . folic acid (FOLVITE) 170 MCG tablet Take 800 mcg by mouth daily.    Marland Kitchen levothyroxine (SYNTHROID, LEVOTHROID) 112 MCG tablet Take 1 tablet by mouth for 5 days, and 1/2 tablet on the other 2 days. (Patient taking differently: Take 56-112 mcg by mouth See admin instructions. TAKE 0.5 TABLET (56 MCG) ON Saturday AND Sunday, TAKE 1 TABLET (112 MCG) ON ALL OTHER DAYS OF THE WEEK Monday TO  Friday.) 90 tablet 0  . Probiotic Product (PROBIOTIC PO) Take 1 capsule by mouth daily.    . psyllium (REGULOID) 0.52 g capsule Take 1.04-1.56 g by mouth at bedtime.    . pyridOXINE (VITAMIN B-6) 100 MG tablet Take 100 mg by mouth daily.    . sertraline (ZOLOFT) 100 MG tablet Take 1.5 tablets (150 mg total) by mouth daily. 135 tablet 2  . temazepam (RESTORIL) 15 MG capsule TAKE ONE CAPSULE BY MOUTH  ONCE DAILY AT BEDTIME AS NEEDED FOR SLEEP 30 capsule 3   No current facility-administered medications on file prior to visit.      Past Medical History:  Diagnosis Date  . Anxiety   . Arthritis   . Depression   . Hyperlipidemia   . Osteoporosis   . Temporal arteritis ([place])   . Thyroid disease    Allergies  Allergen Reactions  . Other     SEVERE ALLERGIC REACTIONS (NECK STIFFNESS/HIGH FEVER/ETC)  . Tdap [Tetanus-Diphth-Acell Pertussis] Other (See Comments)    SEVERE ALLERGIC REACTIONS (NECK STIFFNESS/HIGH FEVER/ETC)  . Tetanus Toxoid, Adsorbed     Neck stiffness, and high fever    Social History   Socioeconomic History  . Marital status: Married    Spouse name: Not on file  . Number of children: Not on file  . Years of education: Not on file  . Highest education level: Not on file  Occupational History  . Not on file  Social Needs  . Financial resource strain: Not on file  . Food insecurity:    Worry: Not on file    Inability: Not on file  . Transportation needs:    Medical: Not on file    Non-medical: Not on file  Tobacco Use  . Smoking status: Former Smoker    Types: Cigarettes  . Smokeless tobacco: Never Used  . Tobacco comment: smoked infrequently when she was young; quit 77 yo   Substance and Sexual Activity  . Alcohol use: No  . Drug use: No  . Sexual activity: Yes    Partners: Male    Birth control/protection: Surgical  Lifestyle  . Physical activity:    Days per week: Not on file    Minutes per session: Not on file  . Stress: Not on file  Relationships  . Social connections:    Talks on phone: Not on file    Gets together: Not on file    Attends religious service: Not on file    Active member of club or organization: Not on file    Attends meetings of clubs or organizations: Not on file    Relationship status: Not on file  Other Topics Concern  . Not on file  Social History Narrative  . Not on file    Vitals:   05/31/18 0825  BP:  104/70  Pulse: 68  Resp: 12  Temp: 98.1 F (36.7 C)  SpO2: 97%   Body mass index is 22.94 kg/m.   Physical Exam  Nursing note and vitals reviewed. Constitutional: She is oriented to person, place, and time. She appears well-developed and well-nourished. She does not appear ill. No distress.  HENT:  Head: Atraumatic.  Mouth/Throat: Oropharynx is clear and moist and mucous membranes are normal.  Eyes: Conjunctivae are normal. No scleral icterus.  Cardiovascular: Normal rate and regular rhythm.  Respiratory: Effort normal and breath sounds normal. No respiratory distress.  GI: Soft. Bowel sounds are normal. She exhibits no distension and no mass. There is no  hepatomegaly. There is tenderness in the right upper quadrant and epigastric area. There is no rigidity, no rebound and no guarding.  Musculoskeletal: She exhibits no edema.       Right elbow: She exhibits normal range of motion and no deformity. Tenderness found.       Left elbow: She exhibits normal range of motion and no deformity. Tenderness found.  Tenderness elicited with palpation, supination (passive and with resistance) bilateral. Right elbow pain also elicited with pronation.  Lymphadenopathy:    She has no cervical adenopathy.  Neurological: She is alert and oriented to person, place, and time. She has normal strength. Gait normal.  Skin: Skin is warm. No rash noted. No erythema.  Psychiatric: Her mood appears anxious.  Well groomed, good eye contact.      ASSESSMENT AND PLAN:   Ms. Tremaine was seen today for stomach issues and nausea. Orders Placed This Encounter  Procedures  . US Abdomen Limited RUQ  . Basic metabolic panel  . TSH  . Ambulatory referral to Physical Therapy   Lab Results  Component Value Date   TSH 2.71 05/31/2018   Lab Results  Component Value Date   CREATININE 0.71 05/31/2018   BUN 19 05/31/2018   NA 138 05/31/2018   K 4.0 05/31/2018   CL 102 05/31/2018   CO2 28 05/31/2018     Upper abdominal pain  Discussed possible etiologies of upper abdominal pain.   Because not improving with PPI and intense nausea + RUQ tenderness upon examination RUQ [place] will be arranged. She was clearly instructed about warning signs.  -     Basic metabolic panel -     US Abdomen Limited RUQ; Future  Lateral epicondylitis of both elbows  Educated about diagnosis and management of epicondylitis. She agrees with PT evaluation, she would benefit from iontophoresis. Topical diclofenac 4 times daily continue local ice. OTC lidocaine patches may also help   -     diclofenac sodium (VOLTAREN) 1 % GEL; Apply 4 g topically 4 (four) times daily. -     Ambulatory referral to Physical Therapy  Nausea without vomiting  Symptomatic treatment with Zofran 4 mg daily recommended. Bland diet and clear fluids.  -     ondansetron (ZOFRAN) 4 MG tablet; Take 1 tablet (4 mg total) by mouth every 8 (eight) hours as needed for up to 5 days for nausea or vomiting.  GERD (gastroesophageal reflux disease) This problem could be contributing to her symptoms. Recommend stopping omeprazole and starting Protonix 40 mg daily. Continue GERD precautions.   Hypothyroidism No changes in current management, will follow labs done today and will give further recommendations accordingly.      Return if symptoms worsen or fail to improve.     Pegi Milazzo G. Martinique, MD  [facility]. [place] office.

## 2018-05-31 NOTE — Assessment & Plan Note (Signed)
This problem could be contributing to her symptoms. Recommend stopping omeprazole and starting Protonix 40 mg daily. Continue GERD precautions.

## 2018-05-31 NOTE — Assessment & Plan Note (Signed)
No changes in current management, will follow labs done today and will give further recommendations accordingly.  

## 2018-05-31 NOTE — Patient Instructions (Addendum)
A few things to remember from today's visit:   Upper abdominal pain - Plan: Basic metabolic panel, US Abdomen Limited RUQ  Gastroesophageal reflux disease, esophagitis presence not specified - Plan: pantoprazole (PROTONIX) 40 MG tablet  Lateral epicondylitis of both elbows - Plan: diclofenac sodium (VOLTAREN) 1 % GEL, Ambulatory referral to Physical Therapy  Hypothyroidism, unspecified type - Plan: TSH  Stop omeprazole. Start Protonix. Please let me know in about 2 weeks his symptoms have improved, before if symptoms get worse.    Codo de [place] Electronics engineer) El codo de [place] es la hinchazn (inflamacin) de los tendones externos del antebrazo cerca del codo. Los tendones [employer] con los [place]. Esta afeccin puede presentarse si practica un deporte o realiza una tarea que exige el uso excesivo del codo. La causa del codo de [place] es la repeticin del mismo movimiento una y Elmon Kirschner. El codo de [place] puede causar lo siguiente:  Dolor y sensibilidad a la palpacin en el Management consultant y la parte externa del codo.  Sensacin de ardor que se extiende desde el codo Loss adjuster, chartered.  Agarre dbil de las manos. CUIDADOS EN EL HOGAR Actividad  Ponga el codo y Advertising copywriter en reposo como se lo haya indicado el mdico. Intente no realizar ninguna actividad que pueda haber causado el problema hasta que el mdico le permita reanudarla.  Si un fisioterapeuta le ensea ejercicios, hgalos como se lo hayan indicado.  Si levanta un objeto, hgalo con la palma de la mano [place] arriba. [employer], se reduce el esfuerzo en el codo. Estilo de vida  Si el codo de [place] se debe a los deportes, revise el equipo y Chief Strategy Officer de lo siguiente: ? Que lo [place] correctamente. ? Que es apto para usted.  Si el codo de [place] se debe al [place], tmese descansos con frecuencia, si es posible. Hable con el gerente respecto de hacer su trabajo de un modo que sea seguro para usted. ? Si el codo de  [place] se debe al uso de la computadora, hable con el gerente [employer] cambios que pueden hacerse en su lugar de [place]. Instrucciones generales  Si se lo indican, aplique hielo sobre la zona dolorida: ? Field seismologist hielo en una bolsa plstica. ? Coloque una [employer] piel y la bolsa de hielo. ? Coloque el hielo durante 60minutos, 2 a 3veces por da.  Tome los medicamentos solamente como se lo haya indicado el mdico.  Si le aconsejaron usar un dispositivo ortopdico, selo como se lo haya indicado el mdico.  Concurra a todas las visitas de control como se lo haya indicado el mdico. Esto es importante. SOLICITE AYUDA SI:  El dolor no mejora con Dispensing optician.  El dolor [place].  Tiene debilidad en el antebrazo, la mano o los dedos de la [place].  No puede sentir el antebrazo, la mano o los dedos de la [place]. Esta informacin no tiene Marine scientist el consejo del mdico. Asegrese de hacerle al mdico cualquier pregunta que tenga. Document Released: 10/28/2010 Document Revised: 02/09/2015 Document Reviewed: 09/21/2014 Elsevier Interactive Patient Education  2018 [employer].  Please be sure medication list is accurate. If a new problem present, please set up appointment sooner than planned today.

## 2018-06-01 ENCOUNTER — Encounter: Payer: Self-pay | Admitting: Family Medicine

## 2018-06-03 NOTE — Telephone Encounter (Signed)
Pt last OV was 05/31/2018 and last refill was 12/17/2017 for 30 tablets with 3 refills, please advise if ok to refill

## 2018-06-05 ENCOUNTER — Telehealth: Payer: Self-pay

## 2018-06-05 NOTE — Telephone Encounter (Signed)
Copied from [place] [id number]. Topic: General - Deceased Patient >> 06/08/18  3:11 PM Oneta Rack wrote: Osvaldo Human name: Caryl Pina  Relation to pt: Support Rep 1 from [place]  Call back number: [phone]   Reason for call:  Patient would like to be referred to [facility] orthopedic instead of [place], please advise

## 2018-06-05 NOTE — Telephone Encounter (Signed)
FYI patient is not deceased, spoke with Caryl Pina and she stated whoever made the CRM was probably rushing because it was time for her to go home.

## 2018-06-05 NOTE — Telephone Encounter (Signed)
Just an FYI, CRM stated that patient was deceased, pt is not. Spoke with Caryl Pina and she stated that was a mistake when CRM was created.

## 2018-06-05 NOTE — Telephone Encounter (Signed)
Referral sent to [facility] by referral coordinator, Nonah Mattes on 06/05/18.

## 2018-06-05 NOTE — Telephone Encounter (Signed)
Referral sent to [facility] on 06/05/18 by Nonah Mattes, Referral Coordinator.

## 2018-06-05 NOTE — Telephone Encounter (Signed)
Copied from [place] [id number]. Topic: General - Deceased Patient >> 2018-06-27  3:11 PM Oneta Rack wrote: Osvaldo Human name: Caryl Pina  Relation to pt: Support Rep 1 from [place]  Call back number: [phone]   Reason for call:  Patient would like to be referred to [facility] orthopedic instead of [place], please advise

## 2018-06-13 ENCOUNTER — Encounter: Payer: Self-pay | Admitting: Family Medicine

## 2018-06-13 ENCOUNTER — Ambulatory Visit
Admission: RE | Admit: 2018-06-13 | Discharge: 2018-06-13 | Disposition: A | Discharge status: Home / Self Care | Payer: Medicare Other | Source: Ambulatory Visit | Attending: Family Medicine | Admitting: Family Medicine

## 2018-06-13 DIAGNOSIS — R1011 Right upper quadrant pain: Secondary | ICD-10-CM | POA: Diagnosis not present

## 2018-06-13 DIAGNOSIS — R1010 Upper abdominal pain, unspecified: Secondary | ICD-10-CM

## 2018-06-13 DIAGNOSIS — R1013 Epigastric pain: Secondary | ICD-10-CM | POA: Diagnosis not present

## 2018-06-14 ENCOUNTER — Other Ambulatory Visit: Payer: Medicare Other

## 2018-06-17 ENCOUNTER — Telehealth: Payer: Self-pay

## 2018-06-18 NOTE — Telephone Encounter (Signed)
It is ok to refer to [place] as she is requesting. Thanks, BJ

## 2018-06-19 ENCOUNTER — Other Ambulatory Visit: Payer: Self-pay | Admitting: Family Medicine

## 2018-06-19 DIAGNOSIS — M7711 Lateral epicondylitis, right elbow: Secondary | ICD-10-CM

## 2018-06-19 DIAGNOSIS — M7712 Lateral epicondylitis, left elbow: Principal | ICD-10-CM

## 2018-06-19 NOTE — Telephone Encounter (Signed)
Referral has been placed, left a message on pt voicemail

## 2018-06-20 ENCOUNTER — Other Ambulatory Visit: Payer: Self-pay | Admitting: Family Medicine

## 2018-06-20 DIAGNOSIS — M7711 Lateral epicondylitis, right elbow: Secondary | ICD-10-CM

## 2018-06-20 DIAGNOSIS — M7712 Lateral epicondylitis, left elbow: Principal | ICD-10-CM

## 2018-06-26 ENCOUNTER — Encounter: Payer: Self-pay | Admitting: Family Medicine

## 2018-06-28 ENCOUNTER — Encounter: Payer: Self-pay | Admitting: Family Medicine

## 2018-06-28 ENCOUNTER — Ambulatory Visit: Payer: Medicare Other | Admitting: Family Medicine

## 2018-06-28 DIAGNOSIS — M7712 Lateral epicondylitis, left elbow: Principal | ICD-10-CM

## 2018-06-28 DIAGNOSIS — M7711 Lateral epicondylitis, right elbow: Secondary | ICD-10-CM

## 2018-07-02 ENCOUNTER — Ambulatory Visit (INDEPENDENT_AMBULATORY_CARE_PROVIDER_SITE_OTHER): Payer: Medicare Other

## 2018-07-02 DIAGNOSIS — Z23 Encounter for immunization: Secondary | ICD-10-CM | POA: Diagnosis not present

## 2018-07-02 NOTE — Progress Notes (Signed)
Per orders of Dr.[name], injection of Pneumococcal 23 and Fluzone High Dose given by Wyvonne Lenz. Patient tolerated injection well.

## 2018-07-08 ENCOUNTER — Telehealth: Payer: Self-pay

## 2018-07-08 NOTE — Telephone Encounter (Unsigned)
Copied from [place] [id number]. Topic: General - Deceased Patient >> 06/27/2018  3:11 PM Oneta Rack wrote: Osvaldo Human name: Caryl Pina  Relation to pt: Support Rep 1 from [place]  Call back number: [phone]   Reason for call:  Patient would like to be referred to [facility] orthopedic instead of [place], please advise

## 2018-07-08 NOTE — Telephone Encounter (Signed)
Pt referral to [facility] has been placed per pt request

## 2018-07-11 ENCOUNTER — Telehealth: Payer: Self-pay

## 2018-07-11 NOTE — Telephone Encounter (Signed)
A referral was placed as requested by pt, per dr Martinique

## 2018-07-11 NOTE — Telephone Encounter (Signed)
Copied from [place] [id number]. Topic: General - Deceased Patient >> 2018/06/23  3:11 PM Oneta Rack wrote: Osvaldo Human name: Caryl Pina  Relation to pt: Support Rep 1 from [place]  Call back number: [phone]   Reason for call:  Patient would like to be referred to [facility] orthopedic instead of [place], please advise

## 2018-07-16 ENCOUNTER — Telehealth: Payer: Self-pay

## 2018-07-16 NOTE — Telephone Encounter (Unsigned)
Copied from [place] [phone]. Topic: General - Deceased Patient >> June 07, 2018  3:11 PM Oneta Rack wrote: Osvaldo Human name: Caryl Pina  Relation to pt: Support Rep 1 from [place]  Call back number: [phone]   Reason for call:  Patient would like to be referred to [facility] orthopedic instead of [place], please advise

## 2018-07-16 NOTE — Telephone Encounter (Signed)
Copied from [place] [id number]. Topic: General - Deceased Patient >> 06/28/2018  3:11 PM Oneta Rack wrote: Osvaldo Human name: Caryl Pina  Relation to pt: Support Rep 1 from [place]  Call back number: [phone]   Reason for call:  Patient would like to be referred to [facility] orthopedic instead of [place], please advise

## 2018-07-16 NOTE — Telephone Encounter (Signed)
[facility] called pt several times  To schedule her an appointment states that she feels better and does not need the refarral appointment at this time.

## 2018-07-26 DIAGNOSIS — Z961 Presence of intraocular lens: Secondary | ICD-10-CM | POA: Diagnosis not present

## 2018-07-26 DIAGNOSIS — H10413 Chronic giant papillary conjunctivitis, bilateral: Secondary | ICD-10-CM | POA: Diagnosis not present

## 2018-07-26 DIAGNOSIS — H26491 Other secondary cataract, right eye: Secondary | ICD-10-CM | POA: Diagnosis not present

## 2018-08-14 ENCOUNTER — Encounter: Payer: Self-pay | Admitting: Family Medicine

## 2018-08-19 ENCOUNTER — Ambulatory Visit: Payer: Medicare Other | Admitting: Family Medicine

## 2018-08-30 ENCOUNTER — Ambulatory Visit: Payer: Self-pay | Admitting: *Deleted

## 2018-08-30 NOTE — Telephone Encounter (Signed)
Pt calling with complaints of abdominal pain on the right side that last a few seconds and then leaves. Pt states that the pain goes right away. Pt denies any other symptoms at this time and rates the pain between 5-6 when it does occur. Pt states that she had a bowel movement on yesterday and has a history of chronic constipation and is currently taking Miralax. Pt offered to make appt for the Saturday clinic but pt prefers to wait to see Dr. Martinique on Monday. Pt states that if pain gets worse she will go to the Saturday clinic or Urgent Care during the weekend. Pt scheduled for appt with Dr. Martinique on Monday 09/02/18.  Reason for Disposition . [1] MODERATE pain (e.g., interferes with normal activities) AND [2] pain comes and goes (cramps) AND [3] present > 24 hours  (Exception: pain with Vomiting or Diarrhea - see that Guideline)  Answer Assessment - Initial Assessment Questions 1. LOCATION: "Where does it hurt?"      Right quadrant 2. RADIATION: "Does the pain shoot anywhere else?" (e.g., chest, back)     No  3. ONSET: "When did the pain begin?" (e.g., minutes, hours or days ago)     Pt states she first noticed it on yesterday 4. SUDDEN: "Gradual or sudden onset?"     sudden 5. PATTERN "Does the pain come and go, or is it constant?"    - If constant: "Is it getting better, staying the same, or worsening?"      (Note: Constant means the pain never goes away completely; most serious pain is constant and it progresses)     - If intermittent: "How long does it last?" "Do you have pain now?"     (Note: Intermittent means the pain goes away completely between bouts)     Comes and goes, sharp pain that only last a couple of seconds 6. SEVERITY: "How bad is the pain?"  (e.g., Scale 1-10; mild, moderate, or severe)   - MILD (1-3): doesn't interfere with normal activities, abdomen soft and not tender to touch    - MODERATE (4-7): interferes with normal activities or awakens from sleep, tender to touch     - SEVERE (8-10): excruciating pain, doubled over, unable to do any normal activities      5-6 7. RECURRENT SYMPTOM: "Have you ever had this type of abdominal pain before?" If so, ask: "When was the last time?" and "What happened that time?"      no 8. CAUSE: "What do you think is causing the abdominal pain?"     Pt is unsure  9. RELIEVING/AGGRAVATING FACTORS: "What makes it better or worse?" (e.g., movement, antacids, bowel movement)     No and pt states she has not taken any over the counter medications because the pain has not been constant 10. OTHER SYMPTOMS: "Has there been any vomiting, diarrhea, constipation, or urine problems?"       Has a history of chronic contipation 11. PREGNANCY: "Is there any chance you are pregnant?" "When was your last menstrual period?"       n/a  Protocols used: ABDOMINAL PAIN - [facility]

## 2018-08-30 NOTE — Telephone Encounter (Signed)
Reason for visit: Request an Appointment    Comments:  A sharp pain in my right upper quadrant of abdomen   that comes and goes fast.  Today at 1 pm happened to me 4 or 5 times.       Left message for pt to return call to discuss symptoms from MyChart message.

## 2018-09-02 ENCOUNTER — Ambulatory Visit (INDEPENDENT_AMBULATORY_CARE_PROVIDER_SITE_OTHER): Payer: Medicare Other | Admitting: Family Medicine

## 2018-09-02 ENCOUNTER — Encounter: Payer: Self-pay | Admitting: Family Medicine

## 2018-09-02 VITALS — BP 104/62 | HR 69 | Temp 98.0°F | Resp 12 | Ht 63.0 in | Wt 131.4 lb

## 2018-09-02 DIAGNOSIS — K219 Gastro-esophageal reflux disease without esophagitis: Secondary | ICD-10-CM

## 2018-09-02 DIAGNOSIS — E559 Vitamin D deficiency, unspecified: Secondary | ICD-10-CM

## 2018-09-02 DIAGNOSIS — K5900 Constipation, unspecified: Secondary | ICD-10-CM | POA: Diagnosis not present

## 2018-09-02 DIAGNOSIS — R1011 Right upper quadrant pain: Secondary | ICD-10-CM

## 2018-09-02 DIAGNOSIS — K581 Irritable bowel syndrome with constipation: Secondary | ICD-10-CM | POA: Insufficient documentation

## 2018-09-02 MED ORDER — LINACLOTIDE 145 MCG PO CAPS: 145.0000 ug | ORAL_CAPSULE | Freq: Every day | ORAL | 1 refills | Status: DC

## 2018-09-02 MED ORDER — PANTOPRAZOLE SODIUM 20 MG PO TBEC: 20.0000 mg | DELAYED_RELEASE_TABLET | Freq: Every day | ORAL | 1 refills | Status: DC

## 2018-09-02 NOTE — Assessment & Plan Note (Signed)
Symptoms have been well controlled with Protonix 40 mg daily. She would like to try decreasing dose of Protonix from 40 mg to 20 mg daily and eventually every other day. Continue GERD precautions. Follow-up in 4 months.

## 2018-09-02 NOTE — Progress Notes (Signed)
ACUTE VISIT   HPI:  Chief Complaint  Patient presents with  . Abdominal Pain    right-sided that comes and goes started Thursday    Jennifer Hull is a 77 y.o. female, who is here today complaining of RUQ abdominal pain about 4 days ago. Pain has resolved, it lasted 2 days. A stabbing-like pain, intermittently, 5-6/10. She did not identified exacerbating or alleviating factors.  No prior history of similar abdominal pain. No associated fever, chills, changes in appetite, nausea, or vomiting.  She was last seen on 05/31/2018, at that time she was complaining of upper abdominal pain and nausea. RUQ abdominal US was negative on 06/13/2018. Protonix 40 mg was recommended.  Constipation, which it seems to be getting worse. She has tried Metamucil, MiraLAX, and Senokot.  When she first try medications they seem to help but eventually stopped working. She has small bowel movements during the day, hard stool.  She does not feel empty. She has not noted blood in the stool or melena. According to patient, she had colonoscopy 4 to 5 years ago, no follow-up was recommended.  History of hypothyroidism, currently she is on levothyroxine 112 mcg daily.  Lab Results  Component Value Date   TSH 2.71 05/31/2018    She also wonders about her vitamin D. According to patient, when she was living in [place] neurologist recommended vitamin D 2000 units. Vitamin D has not been checked in 3-4 years.   Review of Systems  Constitutional: Negative for activity change, appetite change, fatigue and fever.  HENT: Negative for mouth sores and trouble swallowing.   Respiratory: Negative for cough, shortness of breath and wheezing.   Gastrointestinal: Positive for abdominal pain. Negative for abdominal distention, blood in stool, nausea and vomiting.  Genitourinary: Negative for dysuria, hematuria, vaginal bleeding and vaginal discharge.  Musculoskeletal: Positive for arthralgias. Negative  for gait problem.  Skin: Negative for pallor and rash.  Neurological: Negative for seizures, weakness and headaches.  Hematological: Negative for adenopathy. Does not bruise/bleed easily.  Psychiatric/Behavioral: Positive for sleep disturbance. Negative for confusion. The patient is nervous/anxious.       Current Outpatient Medications on File Prior to Visit  Medication Sig Dispense Refill  . acetaminophen (TYLENOL) 500 MG tablet Take by mouth.    Marland Kitchen atorvastatin (LIPITOR) 10 MG tablet TAKE ONE TABLET BY MOUTH ONCE DAILY (Patient taking differently: TAKE ONE TABLET BY MOUTH ONCE DAILY AT NIGHT) 90 tablet 2  . Calcium Carb-Cholecalciferol (CALCIUM 600+D3 PO) Take 1 tablet by mouth daily with lunch.    . cetirizine (ZYRTEC) 10 MG tablet Take 10 mg by mouth daily.    . Cholecalciferol (VITAMIN D3) 2000 units TABS Take 2,000 Units by mouth daily.    . folic acid (FOLVITE) 1 MG tablet Take 1 mg by mouth daily.     Marland Kitchen levothyroxine (SYNTHROID, LEVOTHROID) 112 MCG tablet Take 1 tablet by mouth for 5 days, and 1/2 tablet on the other 2 days. (Patient taking differently: Take 56-112 mcg by mouth See admin instructions. TAKE 0.5 TABLET (56 MCG) ON Saturday AND Sunday, TAKE 1 TABLET (112 MCG) ON ALL OTHER DAYS OF THE WEEK Monday TO Friday.) 90 tablet 0  . polyethylene glycol (MIRALAX / GLYCOLAX) packet Take 17 g by mouth daily.    . Probiotic Product (PROBIOTIC PO) Take 1 capsule by mouth daily.    Marland Kitchen pyridOXINE (VITAMIN B-6) 100 MG tablet Take 100 mg by mouth daily.    . sertraline (ZOLOFT)  100 MG tablet Take 1.5 tablets (150 mg total) by mouth daily. 135 tablet 2  . temazepam (RESTORIL) 15 MG capsule TAKE 1 CAPSULE BY MOUTH AT BEDTIME AS NEEDED FOR SLEEP 30 capsule 3   No current facility-administered medications on file prior to visit.      Past Medical History:  Diagnosis Date  . Anxiety   . Arthritis   . Depression   . Hyperlipidemia   . Osteoporosis   . Temporal arteritis ([place])   .  Thyroid disease    Allergies  Allergen Reactions  . Other     SEVERE ALLERGIC REACTIONS (NECK STIFFNESS/HIGH FEVER/ETC) Other reaction(s): Other (See Comments) SEVERE ALLERGIC REACTIONS (NECK STIFFNESS/HIGH FEVER/ETC)  . Tdap [Tetanus-Diphth-Acell Pertussis] Other (See Comments)    SEVERE ALLERGIC REACTIONS (NECK STIFFNESS/HIGH FEVER/ETC)  . Tetanus Toxoid, Adsorbed     Neck stiffness, and high fever Other reaction(s): Other (See Comments) Neck stiffness, and high fever    Social History   Socioeconomic History  . Marital status: Married    Spouse name: Not on file  . Number of children: Not on file  . Years of education: Not on file  . Highest education level: Not on file  Occupational History  . Not on file  Social Needs  . Financial resource strain: Not on file  . Food insecurity:    Worry: Not on file    Inability: Not on file  . Transportation needs:    Medical: Not on file    Non-medical: Not on file  Tobacco Use  . Smoking status: Former Smoker    Types: Cigarettes  . Smokeless tobacco: Never Used  . Tobacco comment: smoked infrequently when she was young; quit 77 yo   Substance and Sexual Activity  . Alcohol use: No  . Drug use: No  . Sexual activity: Yes    Partners: Male    Birth control/protection: Surgical  Lifestyle  . Physical activity:    Days per week: Not on file    Minutes per session: Not on file  . Stress: Not on file  Relationships  . Social connections:    Talks on phone: Not on file    Gets together: Not on file    Attends religious service: Not on file    Active member of club or organization: Not on file    Attends meetings of clubs or organizations: Not on file    Relationship status: Not on file  Other Topics Concern  . Not on file  Social History Narrative  . Not on file    Vitals:   09/02/18 1131  BP: 104/62  Pulse: 69  Resp: 12  Temp: 98 F (36.7 C)  SpO2: 99%   Body mass index is 23.27 kg/m.    Physical Exam    Nursing note and vitals reviewed. Constitutional: She is oriented to person, place, and time. She appears well-developed and well-nourished. She does not appear ill. No distress.  HENT:  Head: Normocephalic and atraumatic.  Mouth/Throat: Oropharynx is clear and moist and mucous membranes are normal.  Eyes: Conjunctivae are normal. No scleral icterus.  Cardiovascular: Normal rate and regular rhythm.  No murmur heard. Respiratory: Effort normal and breath sounds normal. No respiratory distress.  GI: Soft. Bowel sounds are normal. She exhibits no distension and no mass. There is no hepatomegaly. There is no tenderness.  Musculoskeletal: She exhibits no edema.  Lymphadenopathy:    She has no cervical adenopathy.       Right:  No supraclavicular adenopathy present.       Left: No supraclavicular adenopathy present.  Neurological: She is alert and oriented to person, place, and time. She has normal strength.  Skin: Skin is warm. No rash noted. No erythema.  Psychiatric: She has a normal mood and affect.  Well groomed, good eye contact.    ASSESSMENT AND PLAN:  Jennifer Hull was seen today for abdominal pain.  Orders Placed This Encounter  Procedures  . Comprehensive metabolic panel  . VITAMIN D 25 Hydroxy (Vit-D Deficiency, Fractures)     RUQ abdominal pain Pain has resolved. We discussed possible etiologies, including constipation,?  IBS. Abdominal examination today negative, so I do not think imaging is needed at this time. Clearly instructed about warning signs.  -     Comprehensive metabolic panel  Constipation, unspecified constipation type It seems to be getting worse. Recommend adequate fiber and fluid intake. Benefiber twice daily may also help. After discussion of other pharmacologic options, she agrees to try Linzess every other day. Some side effects discussed. Follow-up in 4 months, before if needed.  -     linaclotide (LINZESS) 145 MCG CAPS capsule; Take 1 capsule  (145 mcg total) by mouth daily before breakfast.  Vitamin D deficiency, unspecified No changes in current management, will follow labs done today and will give further recommendations accordingly.  -     VITAMIN D 25 Hydroxy (Vit-D Deficiency, Fractures)  GERD (gastroesophageal reflux disease) Symptoms have been well controlled with Protonix 40 mg daily. She would like to try decreasing dose of Protonix from 40 mg to 20 mg daily and eventually every other day. Continue GERD precautions. Follow-up in 4 months.     Return in about 4 months (around 01/01/2019) for HLD,anxi,insomnia.     Blanchard Willhite G. Martinique, MD  [facility]. [place] office.

## 2018-09-02 NOTE — Patient Instructions (Addendum)
A few things to remember from today's visit:   RUQ abdominal pain - Plan: Comprehensive metabolic panel  Vitamin D deficiency, unspecified - Plan: VITAMIN D 25 Hydroxy (Vit-D Deficiency, Fractures)  Gastroesophageal reflux disease, esophagitis presence not specified - Plan: pantoprazole (PROTONIX) 20 MG tablet   Follow a bland diet for the next few days, small meals, adequate hydration.   GET HELP RIGHT AWAY IF:   The pain is does not go away within 2 hours.  Sudden severe/worsening pain.  You keep throwing up (vomiting).  The pain changes and is only in the right or left part of the belly.  Not being able to pass gas or poop.  You have bloody or tarry looking poop.   MAKE SURE YOU:   Understand these instructions.  Will watch your condition.  Will get help right away if you are not doing well or get worse.   If symptoms are persistent please arrange a follow up appointment.    Please be sure medication list is accurate. If a new problem present, please set up appointment sooner than planned today.

## 2018-09-07 ENCOUNTER — Encounter: Payer: Self-pay | Admitting: Family Medicine

## 2018-09-18 ENCOUNTER — Telehealth: Payer: Self-pay | Admitting: *Deleted

## 2018-09-18 NOTE — Telephone Encounter (Signed)
Prior auth for Pantoprazole sent to [email or web address] [id number].  Message in Coverm[employer] stated this is available without authorization and I called [employer] and left a detailed message on the voicemail with this info.

## 2018-09-27 DIAGNOSIS — Z96611 Presence of right artificial shoulder joint: Secondary | ICD-10-CM | POA: Diagnosis not present

## 2018-09-27 DIAGNOSIS — M25511 Pain in right shoulder: Secondary | ICD-10-CM | POA: Diagnosis not present

## 2018-09-27 DIAGNOSIS — Z471 Aftercare following joint replacement surgery: Secondary | ICD-10-CM | POA: Diagnosis not present

## 2018-10-20 ENCOUNTER — Other Ambulatory Visit: Payer: Self-pay | Admitting: Family Medicine

## 2018-10-20 DIAGNOSIS — K219 Gastro-esophageal reflux disease without esophagitis: Secondary | ICD-10-CM

## 2018-10-24 DIAGNOSIS — M25511 Pain in right shoulder: Secondary | ICD-10-CM | POA: Diagnosis not present

## 2018-10-24 DIAGNOSIS — M8589 Other specified disorders of bone density and structure, multiple sites: Secondary | ICD-10-CM | POA: Diagnosis not present

## 2018-10-24 DIAGNOSIS — R928 Other abnormal and inconclusive findings on diagnostic imaging of breast: Secondary | ICD-10-CM | POA: Diagnosis not present

## 2018-10-24 DIAGNOSIS — Z6822 Body mass index (BMI) 22.0-22.9, adult: Secondary | ICD-10-CM | POA: Diagnosis not present

## 2018-10-24 DIAGNOSIS — M0589 Other rheumatoid arthritis with rheumatoid factor of multiple sites: Secondary | ICD-10-CM | POA: Diagnosis not present

## 2018-10-24 DIAGNOSIS — M5136 Other intervertebral disc degeneration, lumbar region: Secondary | ICD-10-CM | POA: Diagnosis not present

## 2018-10-24 DIAGNOSIS — R768 Other specified abnormal immunological findings in serum: Secondary | ICD-10-CM | POA: Diagnosis not present

## 2018-10-24 DIAGNOSIS — Z7952 Long term (current) use of systemic steroids: Secondary | ICD-10-CM | POA: Diagnosis not present

## 2018-10-24 DIAGNOSIS — M316 Other giant cell arteritis: Secondary | ICD-10-CM | POA: Diagnosis not present

## 2018-10-24 DIAGNOSIS — G8929 Other chronic pain: Secondary | ICD-10-CM | POA: Diagnosis not present

## 2018-10-24 DIAGNOSIS — M2550 Pain in unspecified joint: Secondary | ICD-10-CM | POA: Diagnosis not present

## 2018-11-06 DIAGNOSIS — R5383 Other fatigue: Secondary | ICD-10-CM | POA: Diagnosis not present

## 2018-11-06 DIAGNOSIS — G8929 Other chronic pain: Secondary | ICD-10-CM | POA: Diagnosis not present

## 2018-11-06 DIAGNOSIS — M316 Other giant cell arteritis: Secondary | ICD-10-CM | POA: Diagnosis not present

## 2018-11-06 DIAGNOSIS — Z6822 Body mass index (BMI) 22.0-22.9, adult: Secondary | ICD-10-CM | POA: Diagnosis not present

## 2018-11-06 DIAGNOSIS — R928 Other abnormal and inconclusive findings on diagnostic imaging of breast: Secondary | ICD-10-CM | POA: Diagnosis not present

## 2018-11-06 DIAGNOSIS — M0589 Other rheumatoid arthritis with rheumatoid factor of multiple sites: Secondary | ICD-10-CM | POA: Diagnosis not present

## 2018-11-06 DIAGNOSIS — Z7952 Long term (current) use of systemic steroids: Secondary | ICD-10-CM | POA: Diagnosis not present

## 2018-11-06 DIAGNOSIS — M5136 Other intervertebral disc degeneration, lumbar region: Secondary | ICD-10-CM | POA: Diagnosis not present

## 2018-11-06 DIAGNOSIS — M2550 Pain in unspecified joint: Secondary | ICD-10-CM | POA: Diagnosis not present

## 2018-11-06 DIAGNOSIS — R768 Other specified abnormal immunological findings in serum: Secondary | ICD-10-CM | POA: Diagnosis not present

## 2018-11-06 DIAGNOSIS — M8589 Other specified disorders of bone density and structure, multiple sites: Secondary | ICD-10-CM | POA: Diagnosis not present

## 2018-11-06 DIAGNOSIS — M25511 Pain in right shoulder: Secondary | ICD-10-CM | POA: Diagnosis not present

## 2018-11-13 DIAGNOSIS — M0579 Rheumatoid arthritis with rheumatoid factor of multiple sites without organ or systems involvement: Secondary | ICD-10-CM | POA: Diagnosis not present

## 2018-12-09 ENCOUNTER — Other Ambulatory Visit: Payer: Self-pay | Admitting: Family Medicine

## 2018-12-09 DIAGNOSIS — E785 Hyperlipidemia, unspecified: Secondary | ICD-10-CM

## 2018-12-11 DIAGNOSIS — M0579 Rheumatoid arthritis with rheumatoid factor of multiple sites without organ or systems involvement: Secondary | ICD-10-CM | POA: Diagnosis not present

## 2018-12-14 ENCOUNTER — Encounter: Payer: Self-pay | Admitting: Family Medicine

## 2018-12-16 ENCOUNTER — Ambulatory Visit (INDEPENDENT_AMBULATORY_CARE_PROVIDER_SITE_OTHER): Payer: Medicare Other | Admitting: Family Medicine

## 2018-12-16 ENCOUNTER — Encounter: Payer: Self-pay | Admitting: Family Medicine

## 2018-12-16 VITALS — BP 98/58 | HR 72 | Temp 98.6°F | Wt 136.0 lb

## 2018-12-16 DIAGNOSIS — J069 Acute upper respiratory infection, unspecified: Secondary | ICD-10-CM

## 2018-12-16 DIAGNOSIS — J020 Streptococcal pharyngitis: Secondary | ICD-10-CM | POA: Diagnosis not present

## 2018-12-16 DIAGNOSIS — R509 Fever, unspecified: Secondary | ICD-10-CM

## 2018-12-16 DIAGNOSIS — B9789 Other viral agents as the cause of diseases classified elsewhere: Secondary | ICD-10-CM | POA: Diagnosis not present

## 2018-12-16 LAB — POCT RAPID STREP A (OFFICE): Rapid Strep A Screen: POSITIVE — AB

## 2018-12-16 LAB — POCT INFLUENZA A/B
Influenza A, POC: NEGATIVE
Influenza B, POC: NEGATIVE

## 2018-12-16 MED ORDER — AMOXICILLIN 500 MG PO CAPS: 500.0000 mg | ORAL_CAPSULE | Freq: Two times a day (BID) | ORAL | 0 refills | Status: DC

## 2018-12-16 NOTE — Patient Instructions (Addendum)
Strep Throat  Strep throat is a bacterial infection of the throat. Your health care provider may call the infection tonsillitis or pharyngitis, depending on whether there is swelling in the tonsils or at the back of the throat. Strep throat is most common during the cold months of the year in children who are 77-77 years of age, but it can happen during any season in people of any age. This infection is spread from person to person (contagious) through coughing, sneezing, or close contact. What are the causes? Strep throat is caused by the bacteria called Streptococcus pyogenes. What increases the risk? This condition is more likely to develop in:  People who spend time in crowded places where the infection can spread easily.  People who have close contact with someone who has strep throat. What are the signs or symptoms? Symptoms of this condition include:  Fever or chills.  Redness, swelling, or pain in the tonsils or throat.  Pain or difficulty when swallowing.  White or yellow spots on the tonsils or throat.  Swollen, tender glands in the neck or under the jaw.  Red rash all over the body (rare). How is this diagnosed? This condition is diagnosed by performing a rapid strep test or by taking a swab of your throat (throat culture test). Results from a rapid strep test are usually ready in a few minutes, but throat culture test results are available after one or two days. How is this treated? This condition is treated with antibiotic medicine. Follow these instructions at home: Medicines  Take over-the-counter and prescription medicines only as told by your health care provider.  Take your antibiotic as told by your health care provider. Do not stop taking the antibiotic even if you start to feel better.  Have family members who also have a sore throat or fever tested for strep throat. They may need antibiotics if they have the strep infection. Eating and drinking  Do not  share food, drinking cups, or personal items that could cause the infection to spread to other people.  If swallowing is difficult, try eating soft foods until your sore throat feels better.  Drink enough fluid to keep your urine clear or pale yellow. General instructions  Gargle with a salt-water mixture 3-4 times per day or as needed. To make a salt-water mixture, completely dissolve -1 tsp of salt in 1 cup of warm water.  Make sure that all household members wash their hands well.  Get plenty of rest.  Stay home from school or work until you have been taking antibiotics for 24 hours.  Keep all follow-up visits as told by your health care provider. This is important. Contact a health care provider if:  The glands in your neck continue to get bigger.  You develop a rash, cough, or earache.  You cough up a thick liquid that is green, yellow-brown, or bloody.  You have pain or discomfort that does not get better with medicine.  Your problems seem to be getting worse rather than better.  You have a fever. Get help right away if:  You have new symptoms, such as vomiting, severe headache, stiff or painful neck, chest pain, or shortness of breath.  You have severe throat pain, drooling, or changes in your voice.  You have swelling of the neck, or the skin on the neck becomes red and tender.  You have signs of dehydration, such as fatigue, dry mouth, and decreased urination.  You become increasingly sleepy, or you  cannot wake up completely.  Your joints become red or painful. This information is not intended to replace advice given to you by your health care provider. Make sure you discuss any questions you have with your health care provider. Document Released: 09/22/2000 Document Revised: 05/24/2016 Document Reviewed: 01/18/2015 Elsevier Interactive Patient Education  2019 [employer].  Upper Respiratory Infection, Adult An upper respiratory infection (URI) is a common  viral infection of the nose, throat, and upper air passages that lead to the lungs. The most common type of URI is the common cold. URIs usually get better on their own, without medical treatment. What are the causes? A URI is caused by a virus. You may catch a virus by:  Breathing in droplets from an infected person's cough or sneeze.  Touching something that has been exposed to the virus (contaminated) and then touching your mouth, nose, or eyes. What increases the risk? You are more likely to get a URI if:  You are very young or very old.  It is autumn or winter.  You have close contact with others, such as at a daycare, school, or health care facility.  You smoke.  You have long-term (chronic) heart or lung disease.  You have a weakened disease-fighting (immune) system.  You have nasal allergies or asthma.  You are experiencing a lot of stress.  You work in an area that has poor air circulation.  You have poor nutrition. What are the signs or symptoms? A URI usually involves some of the following symptoms:  Runny or stuffy (congested) nose.  Sneezing.  Cough.  Sore throat.  Headache.  Fatigue.  Fever.  Loss of appetite.  Pain in your forehead, behind your eyes, and over your cheekbones (sinus pain).  Muscle aches.  Redness or irritation of the eyes.  Pressure in the ears or face. How is this diagnosed? This condition may be diagnosed based on your medical history and symptoms, and a physical exam. Your health care provider may use a cotton swab to take a mucus sample from your nose (nasal swab). This sample can be tested to determine what virus is causing the illness. How is this treated? URIs usually get better on their own within 7-10 days. You can take steps at home to relieve your symptoms. Medicines cannot cure URIs, but your health care provider may recommend certain medicines to help relieve symptoms, such as:  Over-the-counter cold  medicines.  Cough suppressants. Coughing is a type of defense against infection that helps to clear the respiratory system, so take these medicines only as recommended by your health care provider.  Fever-reducing medicines. Follow these instructions at home: Activity  Rest as needed.  If you have a fever, stay home from work or school until your fever is gone or until your health care provider says you are no longer contagious. Your health care provider may have you wear a face mask to prevent your infection from spreading. Relieving symptoms  Gargle with a salt-water mixture 3-4 times a day or as needed. To make a salt-water mixture, completely dissolve -1 tsp of salt in 1 cup of warm water.  Use a cool-mist humidifier to add moisture to the air. This can help you breathe more easily. Eating and drinking   Drink enough fluid to keep your urine pale yellow.  Eat soups and other clear broths. General instructions   Take over-the-counter and prescription medicines only as told by your health care provider. These include cold medicines, fever reducers,  and cough suppressants.  Do not use any products that contain nicotine or tobacco, such as cigarettes and e-cigarettes. If you need help quitting, ask your health care provider.  Stay away from secondhand smoke.  Stay up to date on all immunizations, including the yearly (annual) flu vaccine.  Keep all follow-up visits as told by your health care provider. This is important. How to prevent the spread of infection to others   URIs can be passed from person to person (are contagious). To prevent the infection from spreading: ? Wash your hands often with soap and water. If soap and water are not available, use hand sanitizer. ? Avoid touching your mouth, face, eyes, or nose. ? Cough or sneeze into a tissue or your sleeve or elbow instead of into your hand or into the air. Contact a health care provider if:  You are getting worse  instead of better.  You have a fever or chills.  Your mucus is brown or red.  You have yellow or brown discharge coming from your nose.  You have pain in your face, especially when you bend forward.  You have swollen neck glands.  You have pain while swallowing.  You have white areas in the back of your throat. Get help right away if:  You have shortness of breath that gets worse.  You have severe or persistent: ? Headache. ? Ear pain. ? Sinus pain. ? Chest pain.  You have chronic lung disease along with any of the following: ? Wheezing. ? Prolonged cough. ? Coughing up blood. ? A change in your usual mucus.  You have a stiff neck.  You have changes in your: ? Vision. ? Hearing. ? Thinking. ? Mood. Summary  An upper respiratory infection (URI) is a common infection of the nose, throat, and upper air passages that lead to the lungs.  A URI is caused by a virus.  URIs usually get better on their own within 7-10 days.  Medicines cannot cure URIs, but your health care provider may recommend certain medicines to help relieve symptoms. This information is not intended to replace advice given to you by your health care provider. Make sure you discuss any questions you have with your health care provider. Document Released: 03/21/2001 Document Revised: 05/11/2017 Document Reviewed: 05/11/2017 Elsevier Interactive Patient Education  2019 [employer].

## 2018-12-16 NOTE — Progress Notes (Signed)
Subjective:    Patient ID: Jennifer Hull, female    DOB: [birth year], 78 y.o.   MRN: [id number]  No chief complaint on file.   HPI Patient was seen today for acute concern.  Patient endorses sore throat, runny nose, dry cough, elevated temp Tmax 99, body aches since Saturday.  Pt tried Alka seltzer plus for her symptoms.  Sick contacts include her husband who was started on tamiflu last wk.  Patient mentions taking Actemra IV monthly  Past Medical History:  Diagnosis Date  . Anxiety   . Arthritis   . Depression   . Hyperlipidemia   . Osteoporosis   . Temporal arteritis ([place])   . Thyroid disease     Allergies  Allergen Reactions  . Other     SEVERE ALLERGIC REACTIONS (NECK STIFFNESS/HIGH FEVER/ETC) Other reaction(s): Other (See Comments) SEVERE ALLERGIC REACTIONS (NECK STIFFNESS/HIGH FEVER/ETC)  . Tdap [Tetanus-Diphth-Acell Pertussis] Other (See Comments)    SEVERE ALLERGIC REACTIONS (NECK STIFFNESS/HIGH FEVER/ETC)  . Tetanus Toxoid, Adsorbed     Neck stiffness, and high fever Other reaction(s): Other (See Comments) Neck stiffness, and high fever    ROS General: Denies chills, night sweats, changes in weight, changes in appetite  +fever, body aches HEENT: Denies headaches, ear pain, changes in vision  + rhinorrhea, sore throat CV: Denies CP, palpitations, SOB, orthopnea Pulm: Denies SOB, wheezing  +cough GI: Denies abdominal pain, nausea, vomiting, diarrhea, constipation GU: Denies dysuria, hematuria, frequency, vaginal discharge Msk: Denies muscle cramps, joint pains Neuro: Denies weakness, numbness, tingling Skin: Denies rashes, bruising Psych: Denies depression, anxiety, hallucinations    Objective:    Blood pressure (!) 98/58, pulse 72, temperature 98.6 F (37 C), temperature source Oral, weight 136 lb (61.7 kg), SpO2 97 %.  Gen. Pleasant, well-nourished, in no distress, normal affect  HEENT: [place]/AT, face symmetric, no scleral icterus, PERRLA, EOMI, nares  patent without drainage, pharynx with erythema, no exudate.  TMs full.  No cervical lymphadenopathy Lungs: dry cough, no accessory muscle use, CTAB, no wheezes or rales Cardiovascular: RRR, no m/r/g, no peripheral edema Neuro:  A&Ox3, CN II-XII intact, normal gait Skin:  Warm, no lesions/ rash  Wt Readings from Last 3 Encounters:  12/16/18 136 lb (61.7 kg)  09/02/18 131 lb 6 oz (59.6 kg)  05/31/18 129 lb 8 oz (58.7 kg)    Lab Results  Component Value Date   WBC 12.3 (H) 11/23/2017   HGB 11.4 (L) 11/23/2017   HCT 34.4 (L) 11/23/2017   PLT 199 11/23/2017   GLUCOSE 89 05/31/2018   CHOL 159 06/28/2017   TRIG 107.0 06/28/2017   HDL 61.50 06/28/2017   LDLCALC 76 06/28/2017   ALT 21 11/15/2017   AST 23 11/15/2017   NA 138 05/31/2018   K 4.0 05/31/2018   CL 102 05/31/2018   CREATININE 0.71 05/31/2018   BUN 19 05/31/2018   CO2 28 05/31/2018   TSH 2.71 05/31/2018   INR 0.98 11/15/2017   HGBA1C 5.8 08/28/2016    Assessment/Plan:  Strep pharyngitis  -rapid strep positive -given handout -ok to use NSAIDs prn for pain, fever, or discomfort - Plan: amoxicillin (AMOXIL) 500 MG capsule -given RTC or ED precautions  Viral URI with cough -supportive care  Fever, unspecified fever cause - Plan: POCT rapid strep A  positive -POC Influenza A/B  negative  F/u prn  Grier Mitts, MD

## 2018-12-18 ENCOUNTER — Telehealth: Payer: Self-pay

## 2018-12-18 NOTE — Telephone Encounter (Signed)
Author phoned pt. To see if she would like to reschedule awv for 3/25 when she sees Dr. Martinique. Pt agreeable, and appointment changed.

## 2018-12-19 ENCOUNTER — Encounter: Payer: Self-pay | Admitting: Family Medicine

## 2018-12-20 ENCOUNTER — Telehealth: Payer: Self-pay | Admitting: Family Medicine

## 2018-12-20 ENCOUNTER — Telehealth: Payer: Self-pay

## 2018-12-20 NOTE — Telephone Encounter (Signed)
Copied from [place]. Topic: Quick Communication - See Telephone Encounter >> Dec 20, 2018 10:00 AM Blase Mess A wrote: CRM for notification. See Telephone encounter for: 12/20/18.  Patient's  husband is calling to schedule an x-ray the husband has tested postive neumonia. Patient's husband is asking for a call back Please advise. Thank you.

## 2018-12-20 NOTE — Telephone Encounter (Signed)
Pt's husband calling to f/up on this request. Please forward to Dr Volanda Napoleon as Pt's husband saw her earlier this week during dr Doug Sou absence.

## 2018-12-20 NOTE — Telephone Encounter (Signed)
Pt concerns was addressed by the office manager

## 2018-12-20 NOTE — Telephone Encounter (Signed)
Pt reports cough and runny nose. No fever reported.   Dr. Martinique is out of the office. Please advise. They would like answer today and will be calling EVERY HOUR until they hear from someone.

## 2018-12-23 ENCOUNTER — Other Ambulatory Visit: Payer: Self-pay | Admitting: Family Medicine

## 2018-12-23 ENCOUNTER — Ambulatory Visit (INDEPENDENT_AMBULATORY_CARE_PROVIDER_SITE_OTHER): Payer: Medicare Other

## 2018-12-23 DIAGNOSIS — R05 Cough: Secondary | ICD-10-CM

## 2018-12-23 DIAGNOSIS — R059 Cough, unspecified: Secondary | ICD-10-CM

## 2018-12-23 MED ORDER — BENZONATATE 100 MG PO CAPS: 200.0000 mg | ORAL_CAPSULE | Freq: Two times a day (BID) | ORAL | 0 refills | Status: DC | PRN

## 2018-12-23 NOTE — Telephone Encounter (Signed)
Advised Pt to walk in for Xray tomorrow morning or if she wants to she can try for this afternoon / Pt will walk in today and is on the way

## 2018-12-24 ENCOUNTER — Other Ambulatory Visit: Payer: Self-pay | Admitting: Family Medicine

## 2018-12-24 DIAGNOSIS — J180 Bronchopneumonia, unspecified organism: Secondary | ICD-10-CM

## 2018-12-24 MED ORDER — DOXYCYCLINE HYCLATE 100 MG PO TABS: 100.0000 mg | ORAL_TABLET | Freq: Two times a day (BID) | ORAL | 0 refills | Status: AC

## 2018-12-27 ENCOUNTER — Encounter: Payer: Self-pay | Admitting: Family Medicine

## 2018-12-27 ENCOUNTER — Other Ambulatory Visit: Payer: Self-pay | Admitting: *Deleted

## 2018-12-27 ENCOUNTER — Ambulatory Visit: Payer: Medicare Other

## 2018-12-27 ENCOUNTER — Telehealth: Payer: Self-pay | Admitting: Family Medicine

## 2018-12-27 MED ORDER — BENZONATATE 100 MG PO CAPS: 200.0000 mg | ORAL_CAPSULE | Freq: Two times a day (BID) | ORAL | 0 refills | Status: DC | PRN

## 2018-12-27 NOTE — Telephone Encounter (Signed)
Copied from [place] [id number]. Topic: Quick Communication - Rx Refill/Question >> Dec 27, 2018  1:21 PM Reyne Dumas L wrote: Medication: benzonatate (TESSALON) 100 MG capsule  Has the patient contacted their pharmacy? No refills left (Agent: If no, request that the patient contact the pharmacy for the refill.) (Agent: If yes, when and what did the pharmacy advise?)  Preferred Pharmacy (with phone number or street name): [place], [place] [phone] (Phone) [phone] (Fax)    Agent: Please be advised that RX refills may take up to 3 business days. We ask that you follow-up with your pharmacy.

## 2018-12-27 NOTE — Telephone Encounter (Signed)
Rx sent to pharmacy as requested.

## 2018-12-27 NOTE — Telephone Encounter (Signed)
Dr Morrison Old pt

## 2019-01-01 ENCOUNTER — Ambulatory Visit: Payer: Medicare Other

## 2019-01-01 ENCOUNTER — Ambulatory Visit (INDEPENDENT_AMBULATORY_CARE_PROVIDER_SITE_OTHER): Payer: Medicare Other | Admitting: Family Medicine

## 2019-01-01 ENCOUNTER — Encounter: Payer: Self-pay | Admitting: Family Medicine

## 2019-01-01 ENCOUNTER — Other Ambulatory Visit: Payer: Self-pay

## 2019-01-01 VITALS — BP 130/70 | HR 72 | Temp 97.6°F | Resp 16

## 2019-01-01 DIAGNOSIS — E785 Hyperlipidemia, unspecified: Secondary | ICD-10-CM | POA: Diagnosis not present

## 2019-01-01 DIAGNOSIS — G4700 Insomnia, unspecified: Secondary | ICD-10-CM

## 2019-01-01 DIAGNOSIS — F411 Generalized anxiety disorder: Secondary | ICD-10-CM

## 2019-01-01 DIAGNOSIS — F331 Major depressive disorder, recurrent, moderate: Secondary | ICD-10-CM | POA: Diagnosis not present

## 2019-01-01 DIAGNOSIS — K219 Gastro-esophageal reflux disease without esophagitis: Secondary | ICD-10-CM | POA: Diagnosis not present

## 2019-01-01 DIAGNOSIS — Z0001 Encounter for general adult medical examination with abnormal findings: Secondary | ICD-10-CM

## 2019-01-01 DIAGNOSIS — Z0000 Encounter for general adult medical examination without abnormal findings: Secondary | ICD-10-CM

## 2019-01-01 DIAGNOSIS — R05 Cough: Secondary | ICD-10-CM

## 2019-01-01 DIAGNOSIS — E559 Vitamin D deficiency, unspecified: Secondary | ICD-10-CM

## 2019-01-01 DIAGNOSIS — R059 Cough, unspecified: Secondary | ICD-10-CM

## 2019-01-01 MED ORDER — SERTRALINE HCL 100 MG PO TABS: 125.0000 mg | ORAL_TABLET | Freq: Every day | ORAL | 2 refills | Status: DC

## 2019-01-01 MED ORDER — HYDROCODONE-HOMATROPINE 5-1.5 MG/5ML PO SYRP: 5.0000 mL | ORAL_SOLUTION | Freq: Two times a day (BID) | ORAL | 0 refills | Status: AC | PRN

## 2019-01-01 NOTE — Assessment & Plan Note (Addendum)
Melatonin 5 mg at bedtime seems to be helping. Continue taking temazepam 15 mg at bedtime as needed. We discussed side effects of medication. Good sleep hygiene. Follow-up in 6 months.

## 2019-01-01 NOTE — Assessment & Plan Note (Signed)
This problem can be contributing to cough. She will start taking Protonix 20 mg daily for 3 to 4 weeks and then go back to as needed. Continue GERD precautions.

## 2019-01-01 NOTE — Assessment & Plan Note (Signed)
She will continue atorvastatin 10 mg daily. Continue low-fat diet. We will plan on checking lipid panel next visit.

## 2019-01-01 NOTE — Assessment & Plan Note (Signed)
For now recommend increasing vitamin D supplementation to 1000 units daily. We will plan on checking 25 OH vitamin D next visit.

## 2019-01-01 NOTE — Assessment & Plan Note (Signed)
Stable overall. No changes in temazepam 15 mg daily as needed. Zoloft dose adjusted, she will continue Zoloft 100 mg and 125 mg alternating. Follow-up in 6 months.

## 2019-01-01 NOTE — Progress Notes (Signed)
Virtual Visit via Video Note  I connected with@ on 01/01/19 at  8:00 AM EDT by a video enabled telemedicine application and verified that I am speaking with the correct person using two identifiers.  Location patient: home Location provider:office Persons participating in the virtual visit: patient, provider Today we are doing her routine Medicare preventive visit, chronic disease management, and a few concerns she has about a cough.  I discussed the limitations of evaluation and management by telemedicine and the availability of in person appointments. The patient expressed understanding and agreed to proceed.   HPI: [name] is a 78 yo female seen today via webex for follow up and Medicare preventive visit.  She just completed antibiotic, doxycycline, for bronchopneumonia.  She is concerned because she is still having nonproductive cough. On 12/16/2018 she was diagnosed with ST pharyngitis, she completed 10 days of amoxicillin. She called on 12/23/2018, concerned about persistent cough. CXR done on 12/24/2018, which show bilateral reticulonodular opacities concerning for bronchopneumonia. She was started on doxycycline, which she tolerated well.  Still having nonproductive cough.  Fever has resolved. Reporting "normal" appetite. She denies dyspnea, wheezing, or chest pain. Still feeling fatigued.  Benzonatate capsules is helping some, still waking up in the middle of the night because of coughing.  Chronic medical problems: Hyperlipidemia: Currently she is on atorvastatin 10 mg daily. Tolerating medication well. Lab Results  Component Value Date   CHOL 159 06/28/2017   HDL 61.50 06/28/2017   LDLCALC 76 06/28/2017   TRIG 107.0 06/28/2017   CHOLHDL 3 06/28/2017   Hypothyroidism: Lab Results  Component Value Date   TSH 2.71 05/31/2018   She is on levothyroxine 112 mcg daily except for Saturday and Sunday, which she takes 0.5 tablet.  Anxiety, depression, and  insomnia: Currently she is on temazepam 15 mg at bedtime as needed.  She has been taking melatonin 5 mg at bedtime, which is helping most of the time. Sleeping about 7 to 8 hours. She is tolerating temazepam well, no side effects reported.  A couple months ago she decreased Zoloft from 150 to 125 mg daily.  She tried to decrease Zoloft 200 mg but felt "weird", so increased dose 225 mg daily. She denies depressed mood or suicidal thoughts. She feels like medication is still helping with anxiety and depression.   She lives with her husband.. Independent ADL's and IADL's. No falls in the past year and denies depression symptoms.  Mammogram last done on 05/27/2018, BI-RADS 1. She has mammogram already arranged for 05/2019.  Immunization History  Administered Date(s) Administered  . Influenza, High Dose Seasonal PF 06/27/2017, 07/02/2018  . Pneumococcal Conjugate-13 06/27/2017  . Pneumococcal Polysaccharide-23 07/02/2018    DEXA last done on 05/11/2017, she is following with rheumatologist.  She was on Fosamax for about 4 to 5 years, currently she is not on pharmacologic treatment.  Vitamin D deficiency, she is on vitamin D supplementation 600 U daily. She takes daily calcium recommendation.  She was exercising regularly until gyms were closed due to coronavirus risk exposure. She follows a healthful diet.  Functional Status Survey: Is the patient deaf or have difficulty hearing?: No Does the patient have difficulty seeing, even when wearing glasses/contacts?: No Does the patient have difficulty concentrating, remembering, or making decisions?: No Does the patient have difficulty walking or climbing stairs?: No Does the patient have difficulty dressing or bathing?: No Does the patient have difficulty doing errands alone such as visiting a doctor's office or shopping?: No  Fall  Risk  01/01/2019 12/25/2017  Falls in the past year? 0 Yes  Number falls in past yr: 0 2 or more  Injury with  Fall? 1 Yes  Comment - does not know why  Risk for fall due to : Orthopedic patient (No Data)  Risk for fall due to: Comment - fell in around the bathroom in Oct  Follow up - Education provided    Providers she sees regularly: Eye care provider: Dr Ventura Bruns, annual visits. Rheumatologist, Dr. Suezanne Cheshire.  Following every 3 months since new medication was started, Tocilizumab. She sees orthopedist annually, Dr. Tamera Punt, next appointment in 2021.  Depression screen [facility] 2/9 01/01/2019  Decreased Interest 0  Down, Depressed, Hopeless 0  PHQ - 2 Score 0   Mini-Cog - 01/01/19 0840    Normal clock drawing test?  yes    How many words correct?  3      Vision Screening Comments: WebEx visit. She follows with ophthalmologist periodically.   ROS: See pertinent positives and negatives per HPI.  Past Medical History:  Diagnosis Date  . Anxiety   . Arthritis   . Depression   . Hyperlipidemia   . Osteoporosis   . Temporal arteritis ([place])   . Thyroid disease     Past Surgical History:  Procedure Laterality Date  . ABDOMINAL HYSTERECTOMY    . BREAST EXCISIONAL BIOPSY Left   . BREAST SURGERY     biopsy  . COLONOSCOPY    . TONSILLECTOMY    . 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 Heart disease Mother   . Kidney disease Mother   . Heart disease Father   . Kidney disease Father   . Breast cancer Maternal Aunt    Social History   Socioeconomic History  . Marital status: Married    Spouse name: Not on file  . Number of children: Not on file  . Years of education: Not on file  . Highest education level: Not on file  Occupational History  . Not on file  Social Needs  . Financial resource strain: Not on file  . Food insecurity:    Worry: Not on file    Inability: Not on file   . Transportation needs:    Medical: Not on file    Non-medical: Not on file  Tobacco Use  . Smoking status: Former Smoker    Types: Cigarettes  . Smokeless tobacco: Never Used  . Tobacco comment: smoked infrequently when she was young; quit 78 yo   Substance and Sexual Activity  . Alcohol use: No  . Drug use: No  . Sexual activity: Yes    Partners: Male    Birth control/protection: Surgical  Lifestyle  . Physical activity:    Days per week: Not on file    Minutes per session: Not on file  . Stress: Not on file  Relationships  . Social connections:    Talks on phone: Not on file    Gets together: Not on file    Attends religious service: Not on file    Active member of club or organization: Not on file    Attends meetings of clubs or organizations: Not on file    Relationship status: Not on file  . Intimate partner violence:    Fear of current or  ex partner: Not on file    Emotionally abused: Not on file    Physically abused: Not on file    Forced sexual activity: Not on file  Other Topics Concern  . Not on file  Social History Narrative  . Not on file    Current Outpatient Medications:  .  acetaminophen (TYLENOL) 500 MG tablet, Take by mouth., Disp: , Rfl:  .  atorvastatin (LIPITOR) 10 MG tablet, Take 1 tablet by mouth once daily, Disp: 90 tablet, Rfl: 0 .  benzonatate (TESSALON) 100 MG capsule, Take 2 capsules (200 mg total) by mouth 2 (two) times daily as needed., Disp: 30 capsule, Rfl: 0 .  Calcium Carb-Cholecalciferol (CALCIUM 600+D3 PO), Take 1 tablet by mouth daily with lunch., Disp: , Rfl:  .  cetirizine (ZYRTEC) 10 MG tablet, Take 10 mg by mouth daily., Disp: , Rfl:  .  folic acid (FOLVITE) 1 MG tablet, Take 1 mg by mouth daily. , Disp: , Rfl:  .  HYDROcodone-homatropine (HYCODAN) 5-1.5 MG/5ML syrup, Take 5 mLs by mouth every 12 (twelve) hours as needed for up to 10 days., Disp: 100 mL, Rfl: 0 .  levothyroxine (SYNTHROID, LEVOTHROID) 112 MCG tablet, Take 1  tablet by mouth for 5 days, and 1/2 tablet on the other 2 days. (Patient taking differently: Take 56-112 mcg by mouth See admin instructions. TAKE 0.5 TABLET (56 MCG) ON Saturday AND Sunday, TAKE 1 TABLET (112 MCG) ON ALL OTHER DAYS OF THE WEEK Monday TO Friday.), Disp: 90 tablet, Rfl: 0 .  linaclotide (LINZESS) 145 MCG CAPS capsule, Take 1 capsule (145 mcg total) by mouth daily before breakfast., Disp: 30 capsule, Rfl: 1 .  pantoprazole (PROTONIX) 20 MG tablet, Take 1 tablet (20 mg total) by mouth daily., Disp: 90 tablet, Rfl: 1 .  polyethylene glycol (MIRALAX / GLYCOLAX) packet, Take 17 g by mouth daily., Disp: , Rfl:  .  Probiotic Product (PROBIOTIC PO), Take 1 capsule by mouth daily., Disp: , Rfl:  .  pyridOXINE (VITAMIN B-6) 100 MG tablet, Take 100 mg by mouth daily., Disp: , Rfl:  .  sertraline (ZOLOFT) 100 MG tablet, Take 1.5 tablets (150 mg total) by mouth daily., Disp: 135 tablet, Rfl: 2 .  temazepam (RESTORIL) 15 MG capsule, TAKE 1 CAPSULE BY MOUTH AT BEDTIME AS NEEDED FOR SLEEP, Disp: 30 capsule, Rfl: 3 .  Tocilizumab (ACTEMRA IV), Inject into the vein every 30 (thirty) days., Disp: , Rfl:   EXAM:  VITALS per patient if applicable:  BP 010/27   Pulse 72   Temp 97.6 F (36.4 C)   Resp 16   GENERAL: alert, oriented, appears well and in no acute distress  HEENT: atraumatic, conjunctiva clear, no obvious abnormalities on inspection of external nose and ears  NECK: normal movements of the head and neck  LUNGS: on inspection no signs of respiratory distress, breathing rate appears normal, no obvious gross SOB, gasping or wheezing Nonproductive cough a few times during the visit.  CV: no obvious cyanosis  MS: moves all visible extremities without noticeable abnormality  PSYCH/NEURO: pleasant and cooperative, no obvious depression or anxiety, speech and thought processing grossly intact  ASSESSMENT AND PLAN:  Discussed the following assessment and plan:  Medicare annual  wellness visit, subsequent We discussed the importance of staying active, physically and mentally, as well as the benefits of a healthy/balance diet. Low impact exercise that involve stretching and strengthing are ideal. Vaccines up to date. We discussed preventive screening for the next 5-10 years,  summery of recommendations discussed:  Annual eye exam and glaucoma screening. Fall prevention. DEXA is due in 2021.  Advance directives and end of life discussed, she has POA and living will, I asked her to bring a copy next visit.   Cough  Most respiratory symptoms have resolved. We discussed possible etiologies, residual symptoms from recent respiratory infection, allergies, and GERD. For now recommend continuing symptomatic treatment. We discussed some side effects of hydrocodone. Adequate hydration. Instructed about warning signs. If still coughing in 2 to 3 weeks, we may need to arrange imaging.  - Plan: HYDROcodone-homatropine (HYCODAN) 5-1.5 MG/5ML syrup  GERD (gastroesophageal reflux disease) This problem can be contributing to cough. She will start taking Protonix 20 mg daily for 3 to 4 weeks and then go back to as needed. Continue GERD precautions.  Hyperlipidemia She will continue atorvastatin 10 mg daily. Continue low-fat diet. We will plan on checking lipid panel next visit.  Insomnia Melatonin 5 mg at bedtime seems to be helping. Continue taking temazepam 15 mg at bedtime as needed. We discussed side effects of medication. Good sleep hygiene. Follow-up in 6 months.  Major depressive disorder, recurrent episode, moderate (HCC) Well-controlled. She agrees with decreasing dose of Zoloft from 125 mg daily to 100 and 125 mg alternating. Instructed about warning signs. Follow-up in 6 months.  Generalized anxiety disorder Stable overall. No changes in temazepam 15 mg daily as needed. Zoloft dose adjusted, she will continue Zoloft 100 mg and 125 mg  alternating. Follow-up in 6 months.  Vitamin D deficiency, unspecified For now recommend increasing vitamin D supplementation to 1000 units daily. We will plan on checking 25 OH vitamin D next visit.  Osteoporosis: Fall precautions discussed. Regular physical activity daily as tolerated. Continue calcium supplementation Vitamin D 1000 units daily. Continue following with rheumatologist.  I discussed the assessment and treatment plan with the patient. The patient was provided an opportunity to ask questions and all were answered. The patient agreed with the plan and demonstrated an understanding of the instructions.   The patient was advised to call back or seek an in-person evaluation if the symptoms worsen or if the condition fails to improve as anticipated.  Return in about 6 months (around 07/04/2019) for insomnia,HLD,hypothyroidism.  I provided 38 minutes of non-face-to-face time during this encounter.    [place], MD

## 2019-01-01 NOTE — Assessment & Plan Note (Signed)
Well-controlled. She agrees with decreasing dose of Zoloft from 125 mg daily to 100 and 125 mg alternating. Instructed about warning signs. Follow-up in 6 months.

## 2019-01-16 DIAGNOSIS — M0579 Rheumatoid arthritis with rheumatoid factor of multiple sites without organ or systems involvement: Secondary | ICD-10-CM | POA: Diagnosis not present

## 2019-01-22 ENCOUNTER — Other Ambulatory Visit: Payer: Self-pay | Admitting: Family Medicine

## 2019-01-22 ENCOUNTER — Encounter: Payer: Self-pay | Admitting: Family Medicine

## 2019-01-22 DIAGNOSIS — G4700 Insomnia, unspecified: Secondary | ICD-10-CM

## 2019-01-22 MED ORDER — TEMAZEPAM 15 MG PO CAPS: ORAL_CAPSULE | ORAL | 3 refills | Status: DC

## 2019-02-05 DIAGNOSIS — R768 Other specified abnormal immunological findings in serum: Secondary | ICD-10-CM | POA: Diagnosis not present

## 2019-02-05 DIAGNOSIS — G8929 Other chronic pain: Secondary | ICD-10-CM | POA: Diagnosis not present

## 2019-02-05 DIAGNOSIS — Z7952 Long term (current) use of systemic steroids: Secondary | ICD-10-CM | POA: Diagnosis not present

## 2019-02-05 DIAGNOSIS — M8589 Other specified disorders of bone density and structure, multiple sites: Secondary | ICD-10-CM | POA: Diagnosis not present

## 2019-02-05 DIAGNOSIS — M25511 Pain in right shoulder: Secondary | ICD-10-CM | POA: Diagnosis not present

## 2019-02-05 DIAGNOSIS — R5383 Other fatigue: Secondary | ICD-10-CM | POA: Diagnosis not present

## 2019-02-05 DIAGNOSIS — M0589 Other rheumatoid arthritis with rheumatoid factor of multiple sites: Secondary | ICD-10-CM | POA: Diagnosis not present

## 2019-02-05 DIAGNOSIS — M5136 Other intervertebral disc degeneration, lumbar region: Secondary | ICD-10-CM | POA: Diagnosis not present

## 2019-02-05 DIAGNOSIS — M2550 Pain in unspecified joint: Secondary | ICD-10-CM | POA: Diagnosis not present

## 2019-02-05 DIAGNOSIS — R928 Other abnormal and inconclusive findings on diagnostic imaging of breast: Secondary | ICD-10-CM | POA: Diagnosis not present

## 2019-02-05 DIAGNOSIS — M316 Other giant cell arteritis: Secondary | ICD-10-CM | POA: Diagnosis not present

## 2019-02-17 DIAGNOSIS — M0589 Other rheumatoid arthritis with rheumatoid factor of multiple sites: Secondary | ICD-10-CM | POA: Diagnosis not present

## 2019-02-17 DIAGNOSIS — Z79899 Other long term (current) drug therapy: Secondary | ICD-10-CM | POA: Diagnosis not present

## 2019-02-18 ENCOUNTER — Encounter: Payer: Self-pay | Admitting: Family Medicine

## 2019-02-18 ENCOUNTER — Other Ambulatory Visit: Payer: Self-pay

## 2019-02-18 ENCOUNTER — Ambulatory Visit (INDEPENDENT_AMBULATORY_CARE_PROVIDER_SITE_OTHER): Payer: Medicare Other | Admitting: Family Medicine

## 2019-02-18 DIAGNOSIS — G4700 Insomnia, unspecified: Secondary | ICD-10-CM

## 2019-02-18 DIAGNOSIS — K5900 Constipation, unspecified: Secondary | ICD-10-CM

## 2019-02-18 DIAGNOSIS — F331 Major depressive disorder, recurrent, moderate: Secondary | ICD-10-CM

## 2019-02-18 DIAGNOSIS — E559 Vitamin D deficiency, unspecified: Secondary | ICD-10-CM

## 2019-02-18 DIAGNOSIS — E785 Hyperlipidemia, unspecified: Secondary | ICD-10-CM

## 2019-02-18 DIAGNOSIS — Z8701 Personal history of pneumonia (recurrent): Secondary | ICD-10-CM | POA: Diagnosis not present

## 2019-02-18 DIAGNOSIS — Z0189 Encounter for other specified special examinations: Secondary | ICD-10-CM

## 2019-02-18 DIAGNOSIS — F411 Generalized anxiety disorder: Secondary | ICD-10-CM | POA: Diagnosis not present

## 2019-02-18 MED ORDER — SERTRALINE HCL 100 MG PO TABS: 200.0000 mg | ORAL_TABLET | Freq: Every day | ORAL | 1 refills | Status: DC

## 2019-02-18 NOTE — Progress Notes (Signed)
Virtual Visit via Telephone Note  I connected with Jennifer Hull on 02/18/19 at  2:30 PM EDT by telephone and verified that I am speaking with the correct person using two identifiers.   I discussed the limitations, risks, security and privacy concerns of performing an evaluation and management service by telephone and the availability of in person appointments. I also discussed with the patient that there may be a patient responsible charge related to this service. She expressed understanding and agreed to proceed.  Location patient: home Location provider:home office Participants present for the call: patient, provider Patient did not have a visit in the prior 7 days to address this/these issue(s).   History of Present Illness: [name] is a 78 yo female with history of RA, temporal arteritis, depression, anxiety, hyperlipidemia, and OA, among some; seen today for chronic disease management.  Insomnia, anxiety, and depression: She is on Zoloft, which she increased from 100 mg to 200 mg and had noted improvement in depressed mood and anxiety. She is also on temazepam 15 mg, which she was taking occasionally but for the past 1 to 2 months she has taken it more frequent.  Melatonin 5 mg was helping with insomnia, increased dose to 10 mg but is still not helping.  Having difficulty falling asleep, it takes it about 3 to 4 hours to fall asleep.  If she takes temazepam she can sleep much better, about 9 to 10 hours.  Anxiety has worsened due to current COVID-19 pandemia and stay home recommendations. Tolerating medications well, no side effects reported.  HLD:She is on Atorvastatin 10 mg daily. She would like to have FLP check. She follows a low-fat diet. She is tolerating medication well, no side effects reported.  RA, recently her rheumatologist increased dose of Tocilizumab from 40 mg to 80 mg.  Lab Results  Component Value Date   CHOL 159 06/28/2017   HDL 61.50 06/28/2017   LDLCALC 76 06/28/2017   TRIG 107.0 06/28/2017   CHOLHDL 3 06/28/2017   Vit D deficiency: Currently she is on OTC vitamin D 1000 units daily.  She is also requesting a CXR to follow on CXR done on 12/24/18:Reticulonodular opacities of the lungs may reflect bronchopneumonia.or chronic lung changes. No evidence of lobar pneumonia.  She is still having "little" nonproductive cough. She denies chills, fever, unusual fatigue, chest pain, dyspnea, or wheezing.  Constipation: Her insurance did not cover Linzess. She is taking Senokot daily and MiraLAX as needed. Last bowel movement was yesterday after 3 days of not having one.  She may have bowel movement 2-3 times per day but small and hard stool.  Bloating like sensation that is alleviated by defecation. Negative for nausea, vomiting, or blood in the stool.  She is also requesting to be checked for COVID-19 antibiotics. She denies known exposure to sick patient, no changes in smell or taste sensation.  Current Outpatient Medications on File Prior to Visit  Medication Sig Dispense Refill  . acetaminophen (TYLENOL) 500 MG tablet Take by mouth.    Marland Kitchen atorvastatin (LIPITOR) 10 MG tablet Take 1 tablet by mouth once daily 90 tablet 0  . Calcium Carb-Cholecalciferol (CALCIUM 600+D3 PO) Take 1 tablet by mouth daily with lunch.    . cetirizine (ZYRTEC) 10 MG tablet Take 10 mg by mouth daily.    . folic acid (FOLVITE) 1 MG tablet Take 1 mg by mouth daily.     Marland Kitchen levothyroxine (SYNTHROID, LEVOTHROID) 112 MCG tablet Take 1 tablet by mouth  for 5 days, and 1/2 tablet on the other 2 days. (Patient taking differently: Take 56-112 mcg by mouth See admin instructions. TAKE 0.5 TABLET (56 MCG) ON Saturday AND Sunday, TAKE 1 TABLET (112 MCG) ON ALL OTHER DAYS OF THE WEEK Monday TO Friday.) 90 tablet 0  . pantoprazole (PROTONIX) 20 MG tablet Take 1 tablet (20 mg total) by mouth daily. 90 tablet 1  . polyethylene glycol (MIRALAX / GLYCOLAX) packet Take 17 g by  mouth daily.    . Probiotic Product (PROBIOTIC PO) Take 1 capsule by mouth daily.    Marland Kitchen pyridOXINE (VITAMIN B-6) 100 MG tablet Take 100 mg by mouth daily.    . temazepam (RESTORIL) 15 MG capsule TAKE 1 CAPSULE BY MOUTH AT BEDTIME AS NEEDED FOR SLEEP 30 capsule 3  . Tocilizumab (ACTEMRA IV) Inject into the vein every 30 (thirty) days.     No current facility-administered medications on file prior to visit.      Observations/Objective: Patient sounds cheerful and well on the phone. I do not appreciate any SOB. Speech and thought processing are grossly intact.She is anxious. Patient reported vitals:N/A  Assessment and Plan: Orders Placed This Encounter  Procedures  . SAR CoV2 Serology (COVID 19)AB(IGG)IA  . DG Chest 2 View  . Lipid panel  . VITAMIN D 25 Hydroxy (Vit-D Deficiency, Fractures)    Major depressive disorder, recurrent episode, moderate (HCC) Symptoms improve with Zoloft 200 mg, tolerating medication well, so no changes in current management. Instructed about warning signs. Follow-up in 5 to 6 months, before if needed.  Insomnia Melatonin 10 mg is not helping. Continue temazepam 15 mg daily at bedtime. Good sleep hygiene. Follow-up in 5 to 6 months.  Hyperlipidemia Problem has been well controlled. Problem can be aggravated by Tocilizumab. No changes in atorvastatin 10 mg daily. Further recommendation will be given according to lipid panel results.  Generalized anxiety disorder Better controlled after increasing dose of Zoloft to 200 mg. Continue temazepam 15 mg at bedtime. Once anxiety improves, she can try to take temazepam as needed. Some side effect discussed. Follow-up in 5 to 6 months.  Constipation Still symptomatic. Recommend taking MiraLAX daily + Senokot. Adequate hydration and fiber intake, continue Benefiber twice daily.   Vitamin D deficiency, unspecified Continue OTC vitamin D 1000 units daily. Further recommendation will be given  according to 25 OH vitamin D results.  Patient request for diagnostic testing - SAR CoV2 Serology (COVID 19)AB(IGG)IA; Future  History of recent pneumonia - SAR CoV2 Serology (COVID 19)AB(IGG)IA; Future - DG Chest 2 View; Future   Follow Up Instructions:  Lab and CXR will be arranged. I will see her back in 5-6 months.  I discussed the assessment and treatment plan with the patient. The patient was provided an opportunity to ask questions and all were answered. The patient agreed with the plan and demonstrated an understanding of the instructions.   I provided 23 minutes of non-face-to-face time during this encounter.   Carleton [place], MD

## 2019-02-18 NOTE — Assessment & Plan Note (Signed)
Still symptomatic. Recommend taking MiraLAX daily + Senokot. Adequate hydration and fiber intake, continue Benefiber twice daily.

## 2019-02-18 NOTE — Assessment & Plan Note (Signed)
Better controlled after increasing dose of Zoloft to 200 mg. Continue temazepam 15 mg at bedtime. Once anxiety improves, she can try to take temazepam as needed. Some side effect discussed. Follow-up in 5 to 6 months.

## 2019-02-18 NOTE — Assessment & Plan Note (Signed)
Melatonin 10 mg is not helping. Continue temazepam 15 mg daily at bedtime. Good sleep hygiene. Follow-up in 5 to 6 months.

## 2019-02-18 NOTE — Assessment & Plan Note (Signed)
Symptoms improve with Zoloft 200 mg, tolerating medication well, so no changes in current management. Instructed about warning signs. Follow-up in 5 to 6 months, before if needed.

## 2019-02-18 NOTE — Assessment & Plan Note (Signed)
Continue OTC vitamin D 1000 units daily. Further recommendation will be given according to 25 OH vitamin D results.

## 2019-02-18 NOTE — Assessment & Plan Note (Signed)
Problem has been well controlled. Problem can be aggravated by Tocilizumab. No changes in atorvastatin 10 mg daily. Further recommendation will be given according to lipid panel results.

## 2019-02-21 ENCOUNTER — Other Ambulatory Visit: Payer: Medicare Other

## 2019-02-21 ENCOUNTER — Other Ambulatory Visit: Payer: Self-pay | Admitting: Family Medicine

## 2019-02-21 DIAGNOSIS — E039 Hypothyroidism, unspecified: Secondary | ICD-10-CM

## 2019-02-24 ENCOUNTER — Other Ambulatory Visit (INDEPENDENT_AMBULATORY_CARE_PROVIDER_SITE_OTHER): Payer: Medicare Other

## 2019-02-24 ENCOUNTER — Ambulatory Visit (INDEPENDENT_AMBULATORY_CARE_PROVIDER_SITE_OTHER): Payer: Medicare Other

## 2019-02-24 ENCOUNTER — Other Ambulatory Visit: Payer: Self-pay

## 2019-02-24 DIAGNOSIS — Z8701 Personal history of pneumonia (recurrent): Secondary | ICD-10-CM | POA: Diagnosis not present

## 2019-02-24 DIAGNOSIS — E785 Hyperlipidemia, unspecified: Secondary | ICD-10-CM | POA: Diagnosis not present

## 2019-02-24 DIAGNOSIS — E559 Vitamin D deficiency, unspecified: Secondary | ICD-10-CM

## 2019-02-24 DIAGNOSIS — Z0189 Encounter for other specified special examinations: Secondary | ICD-10-CM

## 2019-02-24 DIAGNOSIS — R05 Cough: Secondary | ICD-10-CM | POA: Diagnosis not present

## 2019-02-24 LAB — VITAMIN D 25 HYDROXY (VIT D DEFICIENCY, FRACTURES): VITD: 51.03 ng/mL (ref 30.00–100.00)

## 2019-02-24 LAB — LIPID PANEL
Cholesterol: 200 mg/dL (ref 0–200)
HDL: 57.5 mg/dL (ref >39.00)
LDL Cholesterol: 116 mg/dL — ABNORMAL HIGH (ref 0–99)
NonHDL: 142.73
Total CHOL/HDL Ratio: 3
Triglycerides: 135 mg/dL (ref 0.0–149.0)
VLDL: 27 mg/dL (ref 0.0–40.0)

## 2019-02-25 DIAGNOSIS — H6123 Impacted cerumen, bilateral: Secondary | ICD-10-CM | POA: Diagnosis not present

## 2019-02-25 DIAGNOSIS — H9313 Tinnitus, bilateral: Secondary | ICD-10-CM | POA: Diagnosis not present

## 2019-02-25 LAB — SAR COV2 SEROLOGY (COVID19)AB(IGG),IA: SARS CoV2 AB IGG: NEGATIVE

## 2019-02-26 ENCOUNTER — Encounter: Payer: Self-pay | Admitting: Family Medicine

## 2019-03-01 ENCOUNTER — Encounter: Payer: Self-pay | Admitting: Family Medicine

## 2019-03-12 ENCOUNTER — Other Ambulatory Visit: Payer: Self-pay | Admitting: Family Medicine

## 2019-03-12 DIAGNOSIS — E785 Hyperlipidemia, unspecified: Secondary | ICD-10-CM

## 2019-03-13 ENCOUNTER — Encounter: Payer: Self-pay | Admitting: Family Medicine

## 2019-03-18 ENCOUNTER — Ambulatory Visit (INDEPENDENT_AMBULATORY_CARE_PROVIDER_SITE_OTHER): Payer: Medicare Other | Admitting: Family Medicine

## 2019-03-18 ENCOUNTER — Encounter: Payer: Self-pay | Admitting: Family Medicine

## 2019-03-18 DIAGNOSIS — D229 Melanocytic nevi, unspecified: Secondary | ICD-10-CM | POA: Diagnosis not present

## 2019-03-18 NOTE — Progress Notes (Signed)
Virtual Visit via Video Note   I connected with Jennifer Hull on 03/18/19 at 10:00 AM EDT by a video enabled telemedicine application and verified that I am speaking with the correct person using two identifiers.  Location patient: home Location provider:home office Persons participating in the virtual visit: patient, provider  I discussed the limitations of evaluation and management by telemedicine and the availability of in person appointments. The patient expressed understanding and agreed to proceed.   HPI:  Jennifer Hull is a 78 yo female with Hx of RA,temporal arteritis,and anxiety who is concerned about skin lesion changing. She has had a "mole" on right side of abdomen for years,last week she noted that it has grown and now it is raise. She has no hx of skin cancer. She has not treated lesion with OTC products.  Lesion is not tender, no easy bleeding.  She denies oral lesions,fever,chills,night sweats,or abnormal wt loss. She is on Tocilizumab every 30 days, she skip dose of today, can not take med before having surgical procedures;hoping that lesion can be excised soon.  ROS: See pertinent positives and negatives per HPI.  Past Medical History:  Diagnosis Date  . Anxiety   . Arthritis   . Depression   . Hyperlipidemia   . Osteoporosis   . Temporal arteritis ([place])   . Thyroid disease     Past Surgical History:  Procedure Laterality Date  . ABDOMINAL HYSTERECTOMY    . BREAST EXCISIONAL BIOPSY Left   . BREAST SURGERY     biopsy  . COLONOSCOPY    . TONSILLECTOMY    . TOTAL SHOULDER ARTHROPLASTY Right 11/22/2017   Procedure: RIGHT REVERSE TOTAL SHOULDER ARTHROPLASTY;  Surgeon: Tania Ade, MD;  Location: [facility];  Service: Orthopedics;  Laterality: Right;    Family History  Problem Relation Age of Onset  . Heart disease Mother   . Kidney disease Mother   . Heart disease Father   . Kidney disease Father   . Breast cancer Maternal Aunt     Social History    Socioeconomic History  . Marital status: Married    Spouse name: Not on file  . Number of children: Not on file  . Years of education: Not on file  . Highest education level: Not on file  Occupational History  . Not on file  Social Needs  . Financial resource strain: Not on file  . Food insecurity:    Worry: Not on file    Inability: Not on file  . Transportation needs:    Medical: Not on file    Non-medical: Not on file  Tobacco Use  . Smoking status: Former Smoker    Types: Cigarettes  . Smokeless tobacco: Never Used  . Tobacco comment: smoked infrequently when she was young; quit 78 yo   Substance and Sexual Activity  . Alcohol use: No  . Drug use: No  . Sexual activity: Yes    Partners: Male    Birth control/protection: Surgical  Lifestyle  . Physical activity:    Days per week: Not on file    Minutes per session: Not on file  . Stress: Not on file  Relationships  . Social connections:    Talks on phone: Not on file    Gets together: Not on file    Attends religious service: Not on file    Active member of club or organization: Not on file    Attends meetings of clubs or organizations: Not on file  Relationship status: Not on file  . Intimate partner violence:    Fear of current or ex partner: Not on file    Emotionally abused: Not on file    Physically abused: Not on file    Forced sexual activity: Not on file  Other Topics Concern  . Not on file  Social History Narrative  . Not on file      Current Outpatient Medications:  .  acetaminophen (TYLENOL) 500 MG tablet, Take by mouth., Disp: , Rfl:  .  atorvastatin (LIPITOR) 10 MG tablet, Take 1 tablet by mouth once daily, Disp: 90 tablet, Rfl: 0 .  Calcium Carb-Cholecalciferol (CALCIUM 600+D3 PO), Take 1 tablet by mouth daily with lunch., Disp: , Rfl:  .  cetirizine (ZYRTEC) 10 MG tablet, Take 10 mg by mouth daily., Disp: , Rfl:  .  EUTHYROX 112 MCG tablet, TAKE 1 TABLET BY MOUTH ONCE DAILY BEFORE  BREAKFAST, Disp: 90 tablet, Rfl: 0 .  folic acid (FOLVITE) 1 MG tablet, Take 1 mg by mouth daily. , Disp: , Rfl:  .  pantoprazole (PROTONIX) 20 MG tablet, Take 1 tablet (20 mg total) by mouth daily., Disp: 90 tablet, Rfl: 1 .  polyethylene glycol (MIRALAX / GLYCOLAX) packet, Take 17 g by mouth daily., Disp: , Rfl:  .  Probiotic Product (PROBIOTIC PO), Take 1 capsule by mouth daily., Disp: , Rfl:  .  pyridOXINE (VITAMIN B-6) 100 MG tablet, Take 100 mg by mouth daily., Disp: , Rfl:  .  sertraline (ZOLOFT) 100 MG tablet, Take 2 tablets (200 mg total) by mouth daily., Disp: 180 tablet, Rfl: 1 .  temazepam (RESTORIL) 15 MG capsule, TAKE 1 CAPSULE BY MOUTH AT BEDTIME AS NEEDED FOR SLEEP, Disp: 30 capsule, Rfl: 3 .  Tocilizumab (ACTEMRA IV), Inject into the vein every 30 (thirty) days., Disp: , Rfl:   EXAM:  VITALS per patient if applicable:N/A  GENERAL: alert, oriented, appears well and in no acute distress  HEENT: atraumatic,normocephalic,and conjunctiva clear.  LUNGS: on inspection no signs of respiratory distress, breathing rate appears normal, no obvious gross SOB, gasping or wheezing  CV: no obvious cyanosis SKIN: Hyperpigmented rounded lesion on abdomen.About 3 mm,defined borders, no erythema.  PSYCH/NEURO: pleasant and cooperative, no obvious depression,+ anxious. Sspeech and thought processing grossly intact  ASSESSMENT AND PLAN:  Discussed the following assessment and plan:  Melanocytic neoplasm of skin - Plan: Ambulatory referral to Dermatology  We discussed possible etiologies. She sent picture also and lesion characteristics on inspection through video do no suggest malignancy. Dermatology referral placed. Instructed about warning signs.    I discussed the assessment and treatment plan with the patient. She was provided an opportunity to ask questions and all were answered. The patient agreed with the plan and demonstrated an understanding of the instructions.   The  patient was advised to call back or seek an in-person evaluation if the symptoms worsen or if the condition fails to improve as anticipated.  No follow-ups on file.    Jennifer Martinique, MD

## 2019-03-19 NOTE — Telephone Encounter (Signed)
Dermatology referral was already placed. [name], MD

## 2019-03-31 DIAGNOSIS — L7211 Pilar cyst: Secondary | ICD-10-CM | POA: Diagnosis not present

## 2019-03-31 DIAGNOSIS — D235 Other benign neoplasm of skin of trunk: Secondary | ICD-10-CM | POA: Diagnosis not present

## 2019-04-02 DIAGNOSIS — M0579 Rheumatoid arthritis with rheumatoid factor of multiple sites without organ or systems involvement: Secondary | ICD-10-CM | POA: Diagnosis not present

## 2019-04-05 ENCOUNTER — Encounter (HOSPITAL_COMMUNITY): Payer: Self-pay | Admitting: Emergency Medicine

## 2019-04-05 ENCOUNTER — Ambulatory Visit (HOSPITAL_COMMUNITY)
Admission: EM | Admit: 2019-04-05 | Discharge: 2019-04-05 | Disposition: A | Discharge status: Home / Self Care | Payer: Medicare Other | Attending: Physician Assistant | Admitting: Physician Assistant

## 2019-04-05 ENCOUNTER — Other Ambulatory Visit: Payer: Self-pay

## 2019-04-05 ENCOUNTER — Telehealth: Payer: Self-pay

## 2019-04-05 DIAGNOSIS — N3090 Cystitis, unspecified without hematuria: Secondary | ICD-10-CM | POA: Diagnosis not present

## 2019-04-05 LAB — POCT URINALYSIS DIP (DEVICE)
Bilirubin Urine: NEGATIVE
Glucose, UA: NEGATIVE mg/dL
Ketones, ur: NEGATIVE mg/dL
Nitrite: NEGATIVE
Protein, ur: NEGATIVE mg/dL
Specific Gravity, Urine: 1.02 (ref 1.005–1.030)
Urobilinogen, UA: 0.2 mg/dL (ref 0.0–1.0)
pH: 7 (ref 5.0–8.0)

## 2019-04-05 MED ORDER — FLUCONAZOLE 150 MG PO TABS: 150.0000 mg | ORAL_TABLET | Freq: Every day | ORAL | 0 refills | Status: DC

## 2019-04-05 MED ORDER — CEPHALEXIN 500 MG PO CAPS: 500.0000 mg | ORAL_CAPSULE | Freq: Four times a day (QID) | ORAL | 0 refills | Status: DC

## 2019-04-05 NOTE — Discharge Instructions (Signed)
Your urine was positive for an urinary tract infection. Start keflex as directed.  Start diflucan to cover for yeast. Keep hydrated, urine should be clear to pale yellow in color. Monitor for any worsening of symptoms, fever, worsening abdominal pain, nausea/vomiting, flank pain, follow up for reevaluation.

## 2019-04-05 NOTE — ED Provider Notes (Signed)
[facility]    CSN: [id number] Arrival date & time: 04/05/19  1332     History   Chief Complaint Chief Complaint  Patient presents with  . Urinary Tract Infection    HPI Jennifer Hull is a 78 y.o. female.   78 year old female comes in for 2 day history of urinary symptoms. Has had urinary frequency, dysuria. Denies hematuria. Denies abdominal pain, nausea, vomiting. Denies fever, chills, night sweats. Denies back/flank pain. She has also noticed some irritation to the vaginal area. Denies vaginal discharge, vaginal bleeding. No new hygiene product changes. History of hysterectomy.   Patient states since symptoms started, has tried to use calamine tea, water/salt mixture to the groin area.      Past Medical History:  Diagnosis Date  . Anxiety   . Arthritis   . Depression   . Hyperlipidemia   . Osteoporosis   . Temporal arteritis ([place])   . Thyroid disease     Patient Active Problem List   Diagnosis Date Noted  . Vitamin D deficiency, unspecified 01/01/2019  . Constipation 09/02/2018  . Epistaxis, recurrent 12/25/2017  . 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 Osteoporosis 06/27/2017  . DOE (dyspnea on exertion) 01/04/2017  . GERD (gastroesophageal reflux disease) 12/26/2016  . Generalized anxiety disorder 08/28/2016  . Hypothyroidism 07/27/2016  . Major depressive disorder, recurrent episode, moderate ([place]) 07/27/2016  . Hyperlipidemia 07/27/2016  . Insomnia 07/27/2016    Past Surgical History:  Procedure Laterality Date  . ABDOMINAL HYSTERECTOMY    . BREAST EXCISIONAL BIOPSY Left   . BREAST SURGERY     biopsy  . COLONOSCOPY    . TONSILLECTOMY    . TOTAL SHOULDER ARTHROPLASTY Right 11/22/2017   Procedure: RIGHT REVERSE TOTAL SHOULDER ARTHROPLASTY;  Surgeon: Tania Ade, MD;  Location: [facility];  Service: Orthopedics;  Laterality: Right;    OB History    Gravida  1   Para      Term      Preterm      AB      Living  1     SAB      TAB      Ectopic      Multiple      Live Births  1            Home Medications    Prior to Admission medications   Medication Sig Start Date End Date Taking? Authorizing Provider  acetaminophen (TYLENOL) 500 MG tablet Take by mouth.    [provider]  atorvastatin (LIPITOR) 10 MG tablet Take 1 tablet by mouth once daily 03/12/19   Martinique, Betty G, MD  Calcium Carb-Cholecalciferol (CALCIUM 600+D3 PO) Take 1 tablet by mouth daily with lunch.    [provider]  cephALEXin (KEFLEX) 500 MG capsule Take 1 capsule (500 mg total) by mouth 4 (four) times daily. 04/05/19   Tasia Catchings, Aziyah Provencal V, PA-C  cetirizine (ZYRTEC) 10 MG tablet Take 10 mg by mouth daily.    [provider]  EUTHYROX 112 MCG tablet TAKE 1 TABLET BY MOUTH ONCE DAILY BEFORE BREAKFAST 02/21/19   Martinique, Betty G, MD  fluconazole (DIFLUCAN) 150 MG tablet Take 1 tablet (150 mg total) by mouth daily. Take second dose 72 hours later if symptoms still persists. 04/05/19   Tasia Catchings, Shakyla Nolley V, PA-C  folic acid (FOLVITE) 1 MG tablet Take 1 mg by mouth daily.     [provider]  pantoprazole (PROTONIX) 20 MG  tablet Take 1 tablet (20 mg total) by mouth daily. 09/02/18   Martinique, Betty G, MD  polyethylene glycol [facility] / Floria Raveling) packet Take 17 g by mouth daily.    [provider]  Probiotic Product (PROBIOTIC PO) Take 1 capsule by mouth daily.    [provider]  pyridOXINE (VITAMIN B-6) 100 MG tablet Take 100 mg by mouth daily.    [provider]  sertraline (ZOLOFT) 100 MG tablet Take 2 tablets (200 mg total) by mouth daily. 02/18/19   Martinique, Betty G, MD  temazepam (RESTORIL) 15 MG capsule TAKE 1 CAPSULE BY MOUTH AT BEDTIME AS NEEDED FOR SLEEP 01/22/19   Martinique, Betty G, MD  Tocilizumab (ACTEMRA IV) Inject into the vein every 30 (thirty) days.    [provider]    Family History Family History  Problem Relation Age of Onset  . Heart disease Mother    . Kidney disease Mother   . Heart disease Father   . Kidney disease Father   . Breast cancer Maternal Aunt     Social History Social History   Tobacco Use  . Smoking status: Former Smoker    Types: Cigarettes  . Smokeless tobacco: Never Used  . Tobacco comment: smoked infrequently when she was young; quit 78 yo   Substance Use Topics  . Alcohol use: No  . Drug use: No     Allergies   Other; Tdap [tetanus-diphth-acell pertussis]; and Tetanus toxoid, adsorbed   Review of Systems Review of Systems  Reason unable to perform ROS: See HPI as above.     Physical Exam Triage Vital Signs ED Triage Vitals [04/05/19 1414]  Enc Vitals Group     BP 112/67     Pulse Rate 69     Resp 16     Temp 98.5 F (36.9 C)     Temp Source Oral     SpO2 100 %     Weight      Height      Head Circumference      Peak Flow      Pain Score 4     Pain Loc      Pain Edu?      Excl. in [place]?    No data found.  Updated Vital Signs BP 112/67 (BP Location: Right Arm)   Pulse 69   Temp 98.5 F (36.9 C) (Oral)   Resp 16   SpO2 100%   Physical Exam Constitutional:      General: She is not in acute distress.    Appearance: She is well-developed. She is not ill-appearing, toxic-appearing or diaphoretic.  HENT:     Head: Normocephalic and atraumatic.  Eyes:     Conjunctiva/sclera: Conjunctivae normal.     Pupils: Pupils are equal, round, and reactive to light.  Cardiovascular:     Rate and Rhythm: Normal rate and regular rhythm.     Heart sounds: Normal heart sounds. No murmur. No friction rub. No gallop.   Pulmonary:     Effort: Pulmonary effort is normal.     Breath sounds: Normal breath sounds. No wheezing or rales.  Abdominal:     General: Bowel sounds are normal.     Palpations: Abdomen is soft.     Tenderness: There is no abdominal tenderness. There is no right CVA tenderness, left CVA tenderness, guarding or rebound.  Skin:    General: Skin is warm and dry.  Neurological:      Mental Status: She  is alert and oriented to person, place, and time.  Psychiatric:        Behavior: Behavior normal.        Judgment: Judgment normal.      UC Treatments / Results  Labs (all labs ordered are listed, but only abnormal results are displayed) Labs Reviewed  POCT URINALYSIS DIP (DEVICE) - Abnormal; Notable for the following components:      Result Value   Hgb urine dipstick TRACE (*)    Leukocytes,Ua SMALL (*)    All other components within normal limits  URINE CULTURE    EKG None  Radiology No results found.  Procedures Procedures (including critical care time)  Medications Ordered in UC Medications - No data to display  Initial Impression / Assessment and Plan / UC Course  I have reviewed the triage vital signs and the nursing notes.  Pertinent labs & imaging results that were available during my care of the patient were reviewed by me and considered in my medical decision making (see chart for details).    Urine dipstick positive for UTI. Start antibiotics as directed. Vaginal irritation could be due to yeast vs patient's home remedies. Will provide diflucan to cover for yeast, patient to discontinue home remedies and avoid soap to the area for now. Push fluids. Return precautions given. Patient expresses understanding and agrees to plan.  Final Clinical Impressions(s) / UC Diagnoses   Final diagnoses:  Cystitis   ED Prescriptions    Medication Sig Dispense Auth. Provider   cephALEXin (KEFLEX) 500 MG capsule Take 1 capsule (500 mg total) by mouth 4 (four) times daily. 28 capsule Makalia Bare V, PA-C   fluconazole (DIFLUCAN) 150 MG tablet Take 1 tablet (150 mg total) by mouth daily. Take second dose 72 hours later if symptoms still persists. 2 tablet Tobin Chad, PA-C 04/05/19 2258

## 2019-04-05 NOTE — ED Triage Notes (Signed)
Per pt she has been having irritation and burning with urination since yesterday. Discomfort in her vaginal area pt stated.

## 2019-04-05 NOTE — Telephone Encounter (Signed)
PEC Put pt through to [facility] because she is having dysuria. We are doing Virtual Visits for [facility]. I advised her to go to an Urgent Care so they could test her urine and possibly send off for culture.  Forwarding message to PCP.

## 2019-04-07 ENCOUNTER — Telehealth (HOSPITAL_COMMUNITY): Payer: Self-pay | Admitting: Emergency Medicine

## 2019-04-07 LAB — URINE CULTURE: Culture: 80000 — AB

## 2019-04-07 NOTE — Telephone Encounter (Signed)
Urine culture was positive for e coli and was given keflex at urgent care visit. Pt contacted and made aware, educated on completing antibiotic and to follow up if symptoms are persistent. Verbalized understanding.    

## 2019-04-24 DIAGNOSIS — M2550 Pain in unspecified joint: Secondary | ICD-10-CM | POA: Diagnosis not present

## 2019-04-24 DIAGNOSIS — M5136 Other intervertebral disc degeneration, lumbar region: Secondary | ICD-10-CM | POA: Diagnosis not present

## 2019-04-24 DIAGNOSIS — M316 Other giant cell arteritis: Secondary | ICD-10-CM | POA: Diagnosis not present

## 2019-04-24 DIAGNOSIS — M25561 Pain in right knee: Secondary | ICD-10-CM | POA: Diagnosis not present

## 2019-04-24 DIAGNOSIS — Z6823 Body mass index (BMI) 23.0-23.9, adult: Secondary | ICD-10-CM | POA: Diagnosis not present

## 2019-04-24 DIAGNOSIS — M8589 Other specified disorders of bone density and structure, multiple sites: Secondary | ICD-10-CM | POA: Diagnosis not present

## 2019-04-24 DIAGNOSIS — M0589 Other rheumatoid arthritis with rheumatoid factor of multiple sites: Secondary | ICD-10-CM | POA: Diagnosis not present

## 2019-04-24 DIAGNOSIS — R768 Other specified abnormal immunological findings in serum: Secondary | ICD-10-CM | POA: Diagnosis not present

## 2019-04-24 DIAGNOSIS — G8929 Other chronic pain: Secondary | ICD-10-CM | POA: Diagnosis not present

## 2019-04-24 DIAGNOSIS — M25511 Pain in right shoulder: Secondary | ICD-10-CM | POA: Diagnosis not present

## 2019-04-24 DIAGNOSIS — R928 Other abnormal and inconclusive findings on diagnostic imaging of breast: Secondary | ICD-10-CM | POA: Diagnosis not present

## 2019-04-24 DIAGNOSIS — Z7952 Long term (current) use of systemic steroids: Secondary | ICD-10-CM | POA: Diagnosis not present

## 2019-04-29 DIAGNOSIS — Z7952 Long term (current) use of systemic steroids: Secondary | ICD-10-CM | POA: Diagnosis not present

## 2019-04-29 DIAGNOSIS — M316 Other giant cell arteritis: Secondary | ICD-10-CM | POA: Diagnosis not present

## 2019-04-29 DIAGNOSIS — M0579 Rheumatoid arthritis with rheumatoid factor of multiple sites without organ or systems involvement: Secondary | ICD-10-CM | POA: Diagnosis not present

## 2019-04-30 ENCOUNTER — Other Ambulatory Visit: Payer: Self-pay | Admitting: Family Medicine

## 2019-04-30 DIAGNOSIS — E039 Hypothyroidism, unspecified: Secondary | ICD-10-CM

## 2019-05-01 DIAGNOSIS — M069 Rheumatoid arthritis, unspecified: Secondary | ICD-10-CM | POA: Diagnosis not present

## 2019-05-01 DIAGNOSIS — H26492 Other secondary cataract, left eye: Secondary | ICD-10-CM | POA: Diagnosis not present

## 2019-05-01 DIAGNOSIS — Z961 Presence of intraocular lens: Secondary | ICD-10-CM | POA: Diagnosis not present

## 2019-05-01 DIAGNOSIS — H1045 Other chronic allergic conjunctivitis: Secondary | ICD-10-CM | POA: Diagnosis not present

## 2019-05-01 DIAGNOSIS — H04123 Dry eye syndrome of bilateral lacrimal glands: Secondary | ICD-10-CM | POA: Diagnosis not present

## 2019-05-02 ENCOUNTER — Other Ambulatory Visit: Payer: Self-pay | Admitting: Family Medicine

## 2019-05-02 DIAGNOSIS — Z1231 Encounter for screening mammogram for malignant neoplasm of breast: Secondary | ICD-10-CM

## 2019-05-05 DIAGNOSIS — M0579 Rheumatoid arthritis with rheumatoid factor of multiple sites without organ or systems involvement: Secondary | ICD-10-CM | POA: Diagnosis not present

## 2019-05-22 ENCOUNTER — Encounter: Payer: Self-pay | Admitting: Family Medicine

## 2019-05-30 ENCOUNTER — Other Ambulatory Visit: Payer: Self-pay | Admitting: Family Medicine

## 2019-05-30 ENCOUNTER — Encounter: Payer: Self-pay | Admitting: Family Medicine

## 2019-05-30 DIAGNOSIS — K219 Gastro-esophageal reflux disease without esophagitis: Secondary | ICD-10-CM

## 2019-06-03 ENCOUNTER — Encounter: Payer: Self-pay | Admitting: Physician Assistant

## 2019-06-03 DIAGNOSIS — M0579 Rheumatoid arthritis with rheumatoid factor of multiple sites without organ or systems involvement: Secondary | ICD-10-CM | POA: Diagnosis not present

## 2019-06-17 ENCOUNTER — Ambulatory Visit (INDEPENDENT_AMBULATORY_CARE_PROVIDER_SITE_OTHER): Payer: Medicare Other | Admitting: Physician Assistant

## 2019-06-17 ENCOUNTER — Encounter: Payer: Self-pay | Admitting: Physician Assistant

## 2019-06-17 ENCOUNTER — Other Ambulatory Visit: Payer: Self-pay

## 2019-06-17 VITALS — BP 104/60 | HR 68 | Temp 97.6°F | Ht 64.0 in | Wt 133.2 lb

## 2019-06-17 DIAGNOSIS — K219 Gastro-esophageal reflux disease without esophagitis: Secondary | ICD-10-CM | POA: Diagnosis not present

## 2019-06-17 DIAGNOSIS — R1013 Epigastric pain: Secondary | ICD-10-CM | POA: Diagnosis not present

## 2019-06-17 DIAGNOSIS — K5900 Constipation, unspecified: Secondary | ICD-10-CM | POA: Diagnosis not present

## 2019-06-17 MED ORDER — LUBIPROSTONE 8 MCG PO CAPS: 8.0000 ug | ORAL_CAPSULE | Freq: Two times a day (BID) | ORAL | 2 refills | Status: DC

## 2019-06-17 MED ORDER — PANTOPRAZOLE SODIUM 40 MG PO TBEC: 40.0000 mg | DELAYED_RELEASE_TABLET | Freq: Every day | ORAL | 3 refills | Status: DC

## 2019-06-17 NOTE — Progress Notes (Signed)
Chief Complaint: Epigastric pain, constipation, GERD  HPI:    Jennifer Hull is a 78 year old female with past medical history as listed below including anxiety and depression, who was referred to me by Martinique, Betty G, MD for a complaint of epigastric pain, constipation and GERD.      06/13/2018 right upper quadrant ultrasound was normal.  No gallstones or wall thickening.    Today, the patient tells me that she is always had some issues with her stomach.  She discusses her constipation noting that she has always taken something in order to have a bowel movement.  She has been tried on various things including MiraLAX and fiber supplements which "all seem to make me more gassy".  Currently she is drinking a glass of prune juice and taking the Senokot which seems to relieve her constipation but she is worried because the prune juice is so sugary.  Describes bowel movements as sometimes thin but also describes issues with hemorrhoids for which she uses Preparation H suppositories at times.  Currently she has no complaints of hemorrhoids.  Doctor tried to prescribe Linzess in the past which is too expensive for her.  Does use Gas-X twice a day to help with increased bloating.  This does work for her. Last colonoscopy 5 yrs ago per her in [place]. Polyps per patient.    Also complains today of a history of gastritis even as long as 30 years ago noted on EGD in [place].  Tells me that she continues with some issues when she eats she will occasionally have epigastric pain right up under her ribs.  She was on Pantoprazole 40 mg daily for the past 2 years and had only limited episodes of this pain, over the past 2 to 3 months this was decreased to 20 mg a day by her PCP and she is now having at least 2 to 3 days of breakthrough symptoms for which she has to use Alka-Seltzer and/or chamomile tea and other things to try and "calm things down".    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  She is  originally from [place].    Denies fever, chills, melena, weight loss, nausea, vomiting or symptoms that awaken her from sleep.     Past Medical History:  Diagnosis Date  . Anxiety   . Arthritis   . Depression   . Hyperlipidemia   . Osteoporosis   . Temporal arteritis ([place])   . Thyroid disease     Past Surgical History:  Procedure Laterality Date  . ABDOMINAL HYSTERECTOMY    . BREAST EXCISIONAL BIOPSY Left   . BREAST SURGERY     biopsy  . COLONOSCOPY    . TONSILLECTOMY    . TOTAL SHOULDER ARTHROPLASTY Right 11/22/2017   Procedure: RIGHT REVERSE TOTAL SHOULDER ARTHROPLASTY;  Surgeon: Tania Ade, MD;  Location: [facility];  Service: Orthopedics;  Laterality: Right;    Current Outpatient Medications  Medication Sig Dispense Refill  . acetaminophen (TYLENOL) 500 MG tablet Take by mouth.    Marland Kitchen atorvastatin (LIPITOR) 10 MG tablet Take 1 tablet by mouth once daily 90 tablet 0  . Calcium Carb-Cholecalciferol (CALCIUM 600+D3 PO) Take 1 tablet by mouth daily with lunch.    . cephALEXin (KEFLEX) 500 MG capsule Take 1 capsule (500 mg total) by mouth 4 (four) times daily. 28 capsule 0  . cetirizine (ZYRTEC) 10 MG tablet Take 10 mg by mouth daily.    . EUTHYROX 112 MCG tablet TAKE 1  TABLET BY MOUTH ONCE DAILY BEFORE BREAKFAST 30 tablet 0  . fluconazole (DIFLUCAN) 150 MG tablet Take 1 tablet (150 mg total) by mouth daily. Take second dose 72 hours later if symptoms still persists. 2 tablet 0  . folic acid (FOLVITE) 1 MG tablet Take 1 mg by mouth daily.     . pantoprazole (PROTONIX) 20 MG tablet Take 1 tablet (20 mg total) by mouth daily. 90 tablet 1  . polyethylene glycol (MIRALAX / GLYCOLAX) packet Take 17 g by mouth daily.    . Probiotic Product (PROBIOTIC PO) Take 1 capsule by mouth daily.    Marland Kitchen pyridOXINE (VITAMIN B-6) 100 MG tablet Take 100 mg by mouth daily.    . sertraline (ZOLOFT) 100 MG tablet Take 2 tablets (200 mg total) by mouth daily. 180 tablet 1  . temazepam (RESTORIL) 15 MG  capsule TAKE 1 CAPSULE BY MOUTH AT BEDTIME AS NEEDED FOR SLEEP 30 capsule 3  . Tocilizumab (ACTEMRA IV) Inject into the vein every 30 (thirty) days.     No current facility-administered medications for this visit.     Allergies as of 06/17/2019 - Review Complete 06/17/2019  Allergen Reaction Noted  . Other  07/27/2016  . Tdap [tetanus-diphth-acell pertussis] Other (See Comments) 07/27/2016  . Tetanus toxoid, adsorbed  12/07/2017    Family History  Problem Relation Age of Onset  . Heart disease Mother   . Kidney disease Mother   . Heart disease Father   . Kidney disease Father   . Breast cancer Maternal Aunt     Social History   Socioeconomic History  . Marital status: Married    Spouse name: Not on file  . Number of children: Not on file  . Years of education: Not on file  . Highest education level: Not on file  Occupational History  . Not on file  Social Needs  . Financial resource strain: Not on file  . Food insecurity    Worry: Not on file    Inability: Not on file  . Transportation needs    Medical: Not on file    Non-medical: Not on file  Tobacco Use  . Smoking status: Former Smoker    Types: Cigarettes  . Smokeless tobacco: Never Used  . Tobacco comment: smoked infrequently when she was young; quit 78 yo   Substance and Sexual Activity  . Alcohol use: No  . Drug use: No  . Sexual activity: Yes    Partners: Male    Birth control/protection: Surgical  Lifestyle  . Physical activity    Days per week: Not on file    Minutes per session: Not on file  . Stress: Not on file  Relationships  . Social Herbalist on phone: Not on file    Gets together: Not on file    Attends religious service: Not on file    Active member of club or organization: Not on file    Attends meetings of clubs or organizations: Not on file    Relationship status: Not on file  . Intimate partner violence    Fear of current or ex partner: Not on file    Emotionally  abused: Not on file    Physically abused: Not on file    Forced sexual activity: Not on file  Other Topics Concern  . Not on file  Social History Narrative  . Not on file    Review of Systems:    Constitutional: No weight loss, fever  or chills Skin: No rash  Cardiovascular: No chest pain Respiratory: No SOB  Gastrointestinal: See HPI and otherwise negative Genitourinary: No dysuria  Neurological: No headache, dizziness or syncope Musculoskeletal: No new muscle or joint pain Hematologic: No bleeding  Psychiatric: No history of depression or anxiety   Physical Exam:  Vital signs: BP 104/60 (BP Location: Left Arm, Patient Position: Sitting, Cuff Size: Normal)   Pulse 68   Temp 97.6 F (36.4 C) (Other (Comment))   Ht 5\' 4"  (1.626 m)   Wt 133 lb 4 oz (60.4 kg)   BMI 22.87 kg/m   Constitutional:   Pleasant Caucasian female appears to be in NAD, Well developed, Well nourished, alert and cooperative Head:  Normocephalic and atraumatic. Eyes:   PEERL, EOMI. No icterus. Conjunctiva pink. Ears:  Normal auditory acuity. Neck:  Supple Throat: Oral cavity and pharynx without inflammation, swelling or lesion.  Respiratory: Respirations even and unlabored. Lungs clear to auscultation bilaterally.   No wheezes, crackles, or rhonchi.  Cardiovascular: Normal S1, S2. No MRG. Regular rate and rhythm. No peripheral edema, cyanosis or pallor.  Gastrointestinal:  Soft, nondistended, nontender. No rebound or guarding. Normal bowel sounds. No appreciable masses or hepatomegaly. Rectal:  Not performed.  Msk:  Symmetrical without gross deformities. Without edema, no deformity or joint abnormality.  Neurologic:  Alert and  oriented x4;  grossly normal neurologically.  Skin:   Dry and intact without significant lesions or rashes. Psychiatric:  Demonstrates good judgement and reason without abnormal affect or behaviors.  No recent labs or imaging.  Assessment: 1.  GERD: Increase in symptoms since  a decrease in dosage of Pantoprazole from 40 to 20 mg daily ; likely gastritis  2.  Epigastric pain: With above  3.  Constipation: Chronic for the patient  Plan: 1.  Recommend increasing patient's Pantoprazole to 40 mg daily.  She may need to be on this chronically given her increase in symptoms with decrease in dosage recently.  Prescribed #30 with 3 refills. 2.  Reviewed antireflux diet and lifestyle modifications. 3.  Prescribed Amitiza 8 mg twice daily with food.  Did provide the patient with samples today.  Prescribed #60 with 3 refills.  Explained to patient that if this is too expensive for her she should call and let us know. 4.  Discussed with patient that if these medications do not help her could discuss endoscopy or other imaging work-up. 5.  Patient to follow in clinic with me in 4 to 6 weeks.  She was assigned to Dr. Rush Landmark today.  Ellouise Newer, PA-C [facility] 06/17/2019, 10:05 AM  Cc: Martinique, Betty G, MD

## 2019-06-17 NOTE — Patient Instructions (Signed)
We have sent the following medications to your pharmacy for you to pick up at your convenience:  Pantoprazole 40 mg daily   Amitiza 8 mcg twice a day

## 2019-06-18 ENCOUNTER — Ambulatory Visit
Admission: RE | Admit: 2019-06-18 | Discharge: 2019-06-18 | Disposition: A | Discharge status: Home / Self Care | Payer: Medicare Other | Source: Ambulatory Visit | Attending: Family Medicine | Admitting: Family Medicine

## 2019-06-18 DIAGNOSIS — Z1231 Encounter for screening mammogram for malignant neoplasm of breast: Secondary | ICD-10-CM

## 2019-06-20 NOTE — Progress Notes (Signed)
Attending Physician's Attestation   I have reviewed the chart.   I agree with the Advanced Practitioner's note, impression, and recommendations with any updates as below.  At time of follow-up if patient still having symptoms would proceed with diagnostic upper endoscopy as well as get her up-to-date for colon polyp surveillance and proceed with colonoscopy.  Justice Britain, MD [facility] Advanced Endoscopy Office # [id number]

## 2019-06-27 ENCOUNTER — Telehealth: Payer: Self-pay | Admitting: Physician Assistant

## 2019-06-27 NOTE — Telephone Encounter (Signed)
Form has already been signed and placed at front desk for pt to pick up. Pt's husband informed.

## 2019-06-30 ENCOUNTER — Other Ambulatory Visit: Payer: Self-pay | Admitting: Family Medicine

## 2019-06-30 DIAGNOSIS — E039 Hypothyroidism, unspecified: Secondary | ICD-10-CM

## 2019-07-01 DIAGNOSIS — Z7952 Long term (current) use of systemic steroids: Secondary | ICD-10-CM | POA: Diagnosis not present

## 2019-07-01 DIAGNOSIS — M0579 Rheumatoid arthritis with rheumatoid factor of multiple sites without organ or systems involvement: Secondary | ICD-10-CM | POA: Diagnosis not present

## 2019-07-21 ENCOUNTER — Encounter: Payer: Self-pay | Admitting: Family Medicine

## 2019-07-21 ENCOUNTER — Other Ambulatory Visit: Payer: Self-pay

## 2019-07-21 ENCOUNTER — Ambulatory Visit (INDEPENDENT_AMBULATORY_CARE_PROVIDER_SITE_OTHER): Payer: Medicare Other | Admitting: Family Medicine

## 2019-07-21 VITALS — BP 110/60 | HR 79 | Temp 97.6°F | Resp 16 | Ht 64.0 in | Wt 136.2 lb

## 2019-07-21 DIAGNOSIS — M25572 Pain in left ankle and joints of left foot: Secondary | ICD-10-CM | POA: Diagnosis not present

## 2019-07-21 DIAGNOSIS — E039 Hypothyroidism, unspecified: Secondary | ICD-10-CM

## 2019-07-21 DIAGNOSIS — M25571 Pain in right ankle and joints of right foot: Secondary | ICD-10-CM

## 2019-07-21 DIAGNOSIS — K5900 Constipation, unspecified: Secondary | ICD-10-CM

## 2019-07-21 DIAGNOSIS — F331 Major depressive disorder, recurrent, moderate: Secondary | ICD-10-CM | POA: Diagnosis not present

## 2019-07-21 DIAGNOSIS — F411 Generalized anxiety disorder: Secondary | ICD-10-CM

## 2019-07-21 DIAGNOSIS — G4700 Insomnia, unspecified: Secondary | ICD-10-CM

## 2019-07-21 DIAGNOSIS — R7303 Prediabetes: Secondary | ICD-10-CM | POA: Diagnosis not present

## 2019-07-21 DIAGNOSIS — G629 Polyneuropathy, unspecified: Secondary | ICD-10-CM

## 2019-07-21 LAB — POCT GLYCOSYLATED HEMOGLOBIN (HGB A1C): Hemoglobin A1C: 5.1 % (ref 4.0–5.6)

## 2019-07-21 MED ORDER — GABAPENTIN 100 MG PO CAPS: 300.0000 mg | ORAL_CAPSULE | Freq: Every day | ORAL | 0 refills | Status: DC

## 2019-07-21 MED ORDER — TEMAZEPAM 15 MG PO CAPS: ORAL_CAPSULE | ORAL | 3 refills | Status: DC

## 2019-07-21 NOTE — Progress Notes (Signed)
HPI:   [name] is a 78 y.o. female, who is here today for chronic disease management.  She was last seen on 03/18/19.  Since her last visit she has seen gastroenterologist. She is currently on Amitiza 8 mcg twice daily, which she does not feel like help with constipation. She is having bowel movements every 2 to 3 days. She is no longer taking MiraLAX or bisacodyl. Bloating sensation and epigastric pain.  She is also on Protonix 40 mg daily.  She has not noted blood in the stool or melena. Negative for nausea,vomiting,or melena.  Benefiber helped with constipation but increase gas production.  She is also complaining about feet burning sensation,tingling,and numbness. She is requesting LE EMG. She has problem for many years,she was dx'ed with peripheral neuropathy and had EMG years ago.  HgA1C 5.9 in 09/2016.  Problem interferes with sleep. No focal weakness. Negative for LE edema or erythema.  Hx of lower back pain radiated to RLE. OA and RA ,follows with rheumatologist. Still c/o toes pain and deformities. Wrist edema, intermittent. She is on Tocilizumab q 30 days.   Anxiety,depression,and insomnia: She is on Sertraline 150 mg daily , she has tried to decrease dose but depression and anxiety gets worse. Denies suicidal thoughts. COVID 19 social isolation has worsening anxiety. She takes Temazepam 15 mg daily as needed.  Sleeping between 7-8 hours. When she does not take medication it takes her a few hours to fall asleep. Tolerating medications well, no side effects.   Review of Systems  Constitutional: Positive for fatigue. Negative for activity change, appetite change, fever and unexpected weight change.  HENT: Negative for mouth sores, nosebleeds and trouble swallowing.   Eyes: Negative for redness and visual disturbance.  Respiratory: Negative for cough, shortness of breath and wheezing.   Cardiovascular: Negative for chest pain,  palpitations and leg swelling.  Gastrointestinal: Negative for abdominal pain, nausea and vomiting.       Negative for changes in bowel habits.  Genitourinary: Negative for decreased urine volume and hematuria.  Musculoskeletal: Positive for arthralgias, back pain and joint swelling.  Skin: Negative for rash and wound.  Allergic/Immunologic: Positive for environmental allergies.  Neurological: Negative for syncope, weakness and headaches.  Psychiatric/Behavioral: Negative for confusion and hallucinations. The patient is nervous/anxious.   Rest see pertinent positives and negatives per HPI.   Current Outpatient Medications on File Prior to Visit  Medication Sig Dispense Refill  . acetaminophen (TYLENOL) 500 MG tablet Take by mouth.    Marland Kitchen atorvastatin (LIPITOR) 10 MG tablet Take 1 tablet by mouth once daily 90 tablet 0  . Calcium Carb-Cholecalciferol (CALCIUM 600+D3 PO) Take 1 tablet by mouth daily with lunch.    . cetirizine (ZYRTEC) 10 MG tablet Take 10 mg by mouth daily.    Arna Medici 112 MCG tablet TAKE 1 TABLET BY MOUTH ONCE DAILY BEFORE BREAKFAST 30 tablet 0  . folic acid (FOLVITE) 1 MG tablet Take 1 mg by mouth daily.     Marland Kitchen lubiprostone (AMITIZA) 8 MCG capsule Take 1 capsule (8 mcg total) by mouth 2 (two) times daily with a meal. 60 capsule 2  . pantoprazole (PROTONIX) 40 MG tablet Take 1 tablet (40 mg total) by mouth daily. 90 tablet 3  . Probiotic Product (PROBIOTIC PO) Take 1 capsule by mouth daily.    . sertraline (ZOLOFT) 100 MG tablet Take 2 tablets (200 mg total) by mouth daily. 180 tablet 1  . Tocilizumab (ACTEMRA IV) Inject  into the vein every 30 (thirty) days.     No current facility-administered medications on file prior to visit.      Past Medical History:  Diagnosis Date  . Anxiety   . Arthritis   . Depression   . Hyperlipidemia   . Osteoporosis   . Temporal arteritis ([place])   . Thyroid disease    Allergies  Allergen Reactions  . Other     SEVERE ALLERGIC  REACTIONS (NECK STIFFNESS/HIGH FEVER/ETC) Other reaction(s): Other (See Comments) SEVERE ALLERGIC REACTIONS (NECK STIFFNESS/HIGH FEVER/ETC)  . Tdap [Tetanus-Diphth-Acell Pertussis] Other (See Comments)    SEVERE ALLERGIC REACTIONS (NECK STIFFNESS/HIGH FEVER/ETC)  . Tetanus Toxoid, Adsorbed     Neck stiffness, and high fever Other reaction(s): Other (See Comments) Neck stiffness, and high fever    Social History   Socioeconomic History  . Marital status: Married    Spouse name: Not on file  . Number of children: Not on file  . Years of education: Not on file  . Highest education level: Not on file  Occupational History  . Not on file  Social Needs  . Financial resource strain: Not on file  . Food insecurity    Worry: Not on file    Inability: Not on file  . Transportation needs    Medical: Not on file    Non-medical: Not on file  Tobacco Use  . Smoking status: Former Smoker    Types: Cigarettes  . Smokeless tobacco: Never Used  . Tobacco comment: smoked infrequently when she was young; quit 78 yo   Substance and Sexual Activity  . Alcohol use: No  . Drug use: No  . Sexual activity: Yes    Partners: Male    Birth control/protection: Surgical  Lifestyle  . Physical activity    Days per week: Not on file    Minutes per session: Not on file  . Stress: Not on file  Relationships  . Social Herbalist on phone: Not on file    Gets together: Not on file    Attends religious service: Not on file    Active member of club or organization: Not on file    Attends meetings of clubs or organizations: Not on file    Relationship status: Not on file  Other Topics Concern  . Not on file  Social History Narrative  . Not on file    Vitals:   07/21/19 1401  BP: 110/60  Pulse: 79  Resp: 16  Temp: 97.6 F (36.4 C)  SpO2: 95%   Body mass index is 23.39 kg/m.    Physical Exam  Nursing note and vitals reviewed. Constitutional: She is oriented to person,  place, and time. She appears well-developed and well-nourished. No distress.  HENT:  Head: Normocephalic and atraumatic.  Mouth/Throat: Oropharynx is clear and moist and mucous membranes are normal.  Eyes: Pupils are equal, round, and reactive to light. Conjunctivae are normal.  Cardiovascular: Normal rate and regular rhythm.  No murmur heard. Pulses:      Dorsalis pedis pulses are 2+ on the right side and 2+ on the left side.  Respiratory: Effort normal and breath sounds normal. No respiratory distress.  GI: Soft. She exhibits no mass. There is no hepatomegaly. There is no abdominal tenderness.  Musculoskeletal:        General: No edema.     Left foot: Deformity present.     Comments: No signs of synovitis. Feet: IP joint fixed flexion. No  erythema or edema.  Lymphadenopathy:    She has no cervical adenopathy.  Neurological: She is alert and oriented to person, place, and time. She has normal strength. No cranial nerve deficit. Gait normal.  Skin: Skin is warm. No rash noted. No erythema.  Psychiatric: She has a normal mood and affect.  Well groomed, good eye contact.    ASSESSMENT AND PLAN:  [name] was seen today for follow-up.  Diagnoses and all orders for this visit:  Lab Results  Component Value Date   HGBA1C 5.1 07/21/2019   Insomnia, unspecified type Temazepam is still helping with sleep. Good sleep hygiene. Gabapentin may help.  -     temazepam (RESTORIL) 15 MG capsule; TAKE 1 CAPSULE BY MOUTH AT BEDTIME AS NEEDED FOR SLEEP  Major depressive disorder, recurrent episode, moderate (HCC) Stable. Continue Sertraline 150 mg daily.  Generalized anxiety disorder Otherwise stable. No changes in current management.  Constipation, unspecified constipation type Continue Imitiza 8 mcg bid. Increase fluid and fiber intake. Try adding Miralax back. Keep appt with GI. Instructed about warning signs.  Prediabetes HgA1C improved. Continue healthy life style for  primary prevention.  -     POC HgB A1c  Peripheral polyneuropathy Chronic. She already had work up  And followed with neurologist. I do not think we need to repeat EMG. Gabapentin recommended, start with 100 mg , titrate dose q 10 days to 300 mg as tolerated. Some side effects discussed. F/U in 6 weeks.  -     gabapentin (NEURONTIN) 100 MG capsule; Take 3 capsules (300 mg total) by mouth at bedtime. Titrate as instructed.  Arthralgia of both feet Most likely related with OA. We discussed treatment options for generalized OA and chronic back pain. Because she is on Sertraline Cymbalta is not an option unless we wean it off. Following with rheuma.    Return in about 6 weeks (around 09/01/2019) for Neuropathy.    -[name] was advised to return sooner than planned today if new concerns arise.    Jennifer Mossbarger G. Martinique, MD  [facility]. [place] office.

## 2019-07-21 NOTE — Patient Instructions (Addendum)
A few things to remember from today's visit:   Insomnia, unspecified type - Plan: temazepam (RESTORIL) 15 MG capsule  Major depressive disorder, recurrent episode, moderate (HCC)  Generalized anxiety disorder  Constipation, unspecified constipation type  Prediabetes  Peripheral polyneuropathy - Plan: gabapentin (NEURONTIN) 100 MG capsule  Arthralgia of both feet Today gabapentin was added to help with burning feet sensation. Start gabapentin 100 mg daily, increase to 200 mg in 10 days, then increase to 300 mg in another 10 days. Goal is to take 300 mg at bedtime.  No changes in rest of your medications.   Please be sure medication list is accurate. If a new problem present, please set up appointment sooner than planned today.

## 2019-07-24 DIAGNOSIS — R768 Other specified abnormal immunological findings in serum: Secondary | ICD-10-CM | POA: Diagnosis not present

## 2019-07-24 DIAGNOSIS — M2550 Pain in unspecified joint: Secondary | ICD-10-CM | POA: Diagnosis not present

## 2019-07-24 DIAGNOSIS — M316 Other giant cell arteritis: Secondary | ICD-10-CM | POA: Diagnosis not present

## 2019-07-24 DIAGNOSIS — G8929 Other chronic pain: Secondary | ICD-10-CM | POA: Diagnosis not present

## 2019-07-24 DIAGNOSIS — R5383 Other fatigue: Secondary | ICD-10-CM | POA: Diagnosis not present

## 2019-07-24 DIAGNOSIS — M8589 Other specified disorders of bone density and structure, multiple sites: Secondary | ICD-10-CM | POA: Diagnosis not present

## 2019-07-24 DIAGNOSIS — M5136 Other intervertebral disc degeneration, lumbar region: Secondary | ICD-10-CM | POA: Diagnosis not present

## 2019-07-24 DIAGNOSIS — Z7952 Long term (current) use of systemic steroids: Secondary | ICD-10-CM | POA: Diagnosis not present

## 2019-07-24 DIAGNOSIS — M25561 Pain in right knee: Secondary | ICD-10-CM | POA: Diagnosis not present

## 2019-07-24 DIAGNOSIS — K5901 Slow transit constipation: Secondary | ICD-10-CM | POA: Diagnosis not present

## 2019-07-24 DIAGNOSIS — Z6823 Body mass index (BMI) 23.0-23.9, adult: Secondary | ICD-10-CM | POA: Diagnosis not present

## 2019-07-24 DIAGNOSIS — M0589 Other rheumatoid arthritis with rheumatoid factor of multiple sites: Secondary | ICD-10-CM | POA: Diagnosis not present

## 2019-07-26 ENCOUNTER — Other Ambulatory Visit: Payer: Self-pay | Admitting: Family Medicine

## 2019-07-26 DIAGNOSIS — E785 Hyperlipidemia, unspecified: Secondary | ICD-10-CM

## 2019-07-29 DIAGNOSIS — M0579 Rheumatoid arthritis with rheumatoid factor of multiple sites without organ or systems involvement: Secondary | ICD-10-CM | POA: Diagnosis not present

## 2019-08-13 ENCOUNTER — Other Ambulatory Visit: Payer: Self-pay | Admitting: Family Medicine

## 2019-08-13 DIAGNOSIS — E785 Hyperlipidemia, unspecified: Secondary | ICD-10-CM

## 2019-08-25 ENCOUNTER — Ambulatory Visit: Payer: Medicare Other | Admitting: Physician Assistant

## 2019-08-25 ENCOUNTER — Other Ambulatory Visit: Payer: Self-pay

## 2019-08-25 ENCOUNTER — Encounter: Payer: Self-pay | Admitting: Physician Assistant

## 2019-08-25 ENCOUNTER — Ambulatory Visit (INDEPENDENT_AMBULATORY_CARE_PROVIDER_SITE_OTHER): Payer: Medicare Other | Admitting: Physician Assistant

## 2019-08-25 VITALS — BP 88/56 | HR 68 | Temp 97.4°F | Ht 63.0 in | Wt 135.0 lb

## 2019-08-25 DIAGNOSIS — R194 Change in bowel habit: Secondary | ICD-10-CM | POA: Diagnosis not present

## 2019-08-25 DIAGNOSIS — K5900 Constipation, unspecified: Secondary | ICD-10-CM | POA: Diagnosis not present

## 2019-08-25 DIAGNOSIS — Z1159 Encounter for screening for other viral diseases: Secondary | ICD-10-CM

## 2019-08-25 DIAGNOSIS — R1013 Epigastric pain: Secondary | ICD-10-CM | POA: Diagnosis not present

## 2019-08-25 MED ORDER — PANTOPRAZOLE SODIUM 40 MG PO TBEC: 40.0000 mg | DELAYED_RELEASE_TABLET | Freq: Every day | ORAL | 3 refills | Status: DC

## 2019-08-25 MED ORDER — NA SULFATE-K SULFATE-MG SULF 17.5-3.13-1.6 GM/177ML PO SOLN: 1.0000 | Freq: Once | ORAL | 0 refills | Status: AC

## 2019-08-25 MED ORDER — METOCLOPRAMIDE HCL 10 MG PO TABS: ORAL_TABLET | ORAL | 0 refills | Status: DC

## 2019-08-25 NOTE — Progress Notes (Signed)
Chief Complaint: Follow-up abdominal pain, constipation  HPI:    Jennifer Hull is a 78 year old female with a past medical history as listed below, assigned to Dr. Rush Landmark at last visit, who presents to clinic today for follow-up of abdominal pain and constipation.    06/17/2019 patient seen in clinic by me for epigastric pain, constipation and reflux.  At that time the patient described she always had issues with her stomach and was always taking something in order to have a bowel movement.  At that time was using prune juice and Senokot.  Also using Gas-X twice a day for bloating.  Also complained of gastritis for as long as 30 years.  On Pantoprazole 40 mg daily for 2 years only had limited episodes of pain, decreased to 20 mg a day by her PCP and was having 2 to 3 days of breakthrough symptoms.  At that time recommend patient increase back to Pantoprazole 40 mg daily.  Prescribed Amitiza 8 mcg twice daily with food and discussed that if these medications did not help then would discuss endoscopy or other imaging work-up.    Social history positive for being retired Marine scientist, originally from [place].    Today, the patient presents clinic and explains that her epigastric pain is completely gone since increasing back to Pantoprazole 40 mg daily.  She asked if she can continue on this medication going forward or not.    Explained that she tried taking Amitiza as prescribed at last visit but using this even with meals made her nauseous.  It also was still not working even with adding Miralax qd.  Patient tells me she continues with a problem of urinating and having a small bit of stool and never feeling completely empty.  She is worried that something is going on inside like "stenosis" or other.    Denies fever, chills or weight loss.     Past Medical History:  Diagnosis Date  . Anxiety   . Arthritis   . Depression   . Hyperlipidemia   . Osteoporosis   . Temporal arteritis ([place])   . Thyroid disease     Past Surgical History:  Procedure Laterality Date  . ABDOMINAL HYSTERECTOMY    . BREAST EXCISIONAL BIOPSY Left   . BREAST SURGERY     biopsy  . COLONOSCOPY    . TONSILLECTOMY    . TOTAL SHOULDER ARTHROPLASTY Right 11/22/2017   Procedure: RIGHT REVERSE TOTAL SHOULDER ARTHROPLASTY;  Surgeon: Tania Ade, MD;  Location: [facility];  Service: Orthopedics;  Laterality: Right;    Current Outpatient Medications  Medication Sig Dispense Refill  . acetaminophen (TYLENOL) 500 MG tablet Take by mouth.    Marland Kitchen atorvastatin (LIPITOR) 10 MG tablet Take 1 tablet by mouth once daily 90 tablet 0  . Calcium Carb-Cholecalciferol (CALCIUM 600+D3 PO) Take 1 tablet by mouth daily with lunch.    . cetirizine (ZYRTEC) 10 MG tablet Take 10 mg by mouth daily.    Arna Medici 112 MCG tablet TAKE 1 TABLET BY MOUTH ONCE DAILY BEFORE BREAKFAST 30 tablet 0  . folic acid (FOLVITE) 1 MG tablet Take 1 mg by mouth daily.     Marland Kitchen gabapentin (NEURONTIN) 100 MG capsule Take 3 capsules (300 mg total) by mouth at bedtime. Titrate as instructed. 90 capsule 0  . lubiprostone (AMITIZA) 8 MCG capsule Take 1 capsule (8 mcg total) by mouth 2 (two) times daily with a meal. 60 capsule 2  . pantoprazole (PROTONIX) 40 MG tablet Take 1  tablet (40 mg total) by mouth daily. 90 tablet 3  . Probiotic Product (PROBIOTIC PO) Take 1 capsule by mouth daily.    . sertraline (ZOLOFT) 100 MG tablet Take 2 tablets (200 mg total) by mouth daily. 180 tablet 1  . temazepam (RESTORIL) 15 MG capsule TAKE 1 CAPSULE BY MOUTH AT BEDTIME AS NEEDED FOR SLEEP 30 capsule 3  . Tocilizumab (ACTEMRA IV) Inject into the vein every 30 (thirty) days.     No current facility-administered medications for this visit.     Allergies as of 08/25/2019 - Review Complete 07/21/2019  Allergen Reaction Noted  . Other  07/27/2016  . Tdap [tetanus-diphth-acell pertussis] Other (See Comments) 07/27/2016  . Tetanus toxoid, adsorbed  12/07/2017    Family History  Problem  Relation Age of Onset  . Heart disease Mother   . Kidney disease Mother   . Heart disease Father   . Kidney disease Father   . Breast cancer Maternal Aunt     Social History   Socioeconomic History  . Marital status: Married    Spouse name: Not on file  . Number of children: Not on file  . Years of education: Not on file  . Highest education level: Not on file  Occupational History  . Not on file  Social Needs  . Financial resource strain: Not on file  . Food insecurity    Worry: Not on file    Inability: Not on file  . Transportation needs    Medical: Not on file    Non-medical: Not on file  Tobacco Use  . Smoking status: Former Smoker    Types: Cigarettes  . Smokeless tobacco: Never Used  . Tobacco comment: smoked infrequently when she was young; quit 78 yo   Substance and Sexual Activity  . Alcohol use: No  . Drug use: No  . Sexual activity: Yes    Partners: Male    Birth control/protection: Surgical  Lifestyle  . Physical activity    Days per week: Not on file    Minutes per session: Not on file  . Stress: Not on file  Relationships  . Social Herbalist on phone: Not on file    Gets together: Not on file    Attends religious service: Not on file    Active member of club or organization: Not on file    Attends meetings of clubs or organizations: Not on file    Relationship status: Not on file  . Intimate partner violence    Fear of current or ex partner: Not on file    Emotionally abused: Not on file    Physically abused: Not on file    Forced sexual activity: Not on file  Other Topics Concern  . Not on file  Social History Narrative  . Not on file    Review of Systems:    Constitutional: No weight loss, fever or chills Cardiovascular: No chest pain  Respiratory: No SOB Gastrointestinal: See HPI and otherwise negative   Physical Exam:  Vital signs: BP (!) 88/56   Pulse 68   Temp (!) 97.4 F (36.3 C)   Ht 5\' 3"  (1.6 m)   Wt 135 lb  (61.2 kg)   BMI 23.91 kg/m   Constitutional:   Pleasant female appears to be in NAD, Well developed, Well nourished, alert and cooperative Respiratory: Respirations even and unlabored. Lungs clear to auscultation bilaterally.   No wheezes, crackles, or rhonchi.  Cardiovascular: Normal S1,  S2. No MRG. Regular rate and rhythm. No peripheral edema, cyanosis or pallor.  Gastrointestinal:  Soft, nondistended, nontender. No rebound or guarding. Normal bowel sounds. No appreciable masses or hepatomegaly. Rectal:  Not performed.  Psychiatric: Demonstrates good judgement and reason without abnormal affect or behaviors.  No recent labs or imaging.  Assessment: 1.  Constipation: Change for the patient, now chronic, unimproved on Amitiza 8 twice daily with MiraLAX; consider mechanical obstruction versus other 2.  Epigastric pain: Better with Pantoprazole 40 daily  Plan: 1.  Patient wonders if she needs to stay on Pantoprazole 40 mg daily forever, she does tell me her epigastric pain is a lot better.  For now we will continue her on this.  Refilled #90 with 3 refills. 2.  Discussed EGD and colonoscopy given her recent complaints.  Patient agrees to proceed.  Discussed risk, benefits, limitations and alternatives the patient agreed to proceed with Dr. Rush Landmark in the [facility]. 3.  Patient was prescribed Reglan 10 mg #2, 1 tab 20 to 30 minutes before starting shaft of prep given her history of nausea and vomiting before.  Also given Suprep to decrease amount. 4.  We will need to discuss other agents for constipation, has tried Amitiza, MiraLAX in the past and cannot afford Linzess.  Dr. Rush Landmark can discuss this with her after time of procedure. 5.  Patient to follow in clinic with [place] as recommended after procedure.  Ellouise Newer, PA-C [facility] 08/25/2019, 3:29 PM  Cc: Martinique, Betty G, MD

## 2019-08-25 NOTE — Patient Instructions (Addendum)
If you are age 77 or older, your body mass index should be between 23-30. Your Body mass index is 23.91 kg/m. If this is out of the aforementioned range listed, please consider follow up with your Primary Care Provider.  If you are age 45 or younger, your body mass index should be between 19-25. Your Body mass index is 23.91 kg/m. If this is out of the aformentioned range listed, please consider follow up with your Primary Care Provider.   We have sent the following medications to your pharmacy for you to pick up at your convenience:  1. Continue Pantoprazole 40 mg.   2. Reglan 10 mg - take one tablet at 5:30 pm on 09/01/19 prior to first prep, take second tablet at 5:30 am on 09/02/19 prior to second prep.   You have been scheduled for an endoscopy and colonoscopy. Please follow the written instructions given to you at your visit today. Please pick up your prep supplies at the pharmacy within the next 1-3 days. If you use inhalers (even only as needed), please bring them with you on the day of your procedure.

## 2019-08-25 NOTE — Progress Notes (Signed)
Attending Physician's Attestation   I have reviewed the chart.   I agree with the Advanced Practitioner's note, impression, and recommendations with any updates as below.    Korey Arroyo Mansouraty, MD [facility] Advanced Endoscopy Office # [phone]  

## 2019-08-26 DIAGNOSIS — M0579 Rheumatoid arthritis with rheumatoid factor of multiple sites without organ or systems involvement: Secondary | ICD-10-CM | POA: Diagnosis not present

## 2019-08-27 ENCOUNTER — Telehealth: Payer: Self-pay | Admitting: Family Medicine

## 2019-08-27 DIAGNOSIS — G629 Polyneuropathy, unspecified: Secondary | ICD-10-CM

## 2019-08-27 MED ORDER — GABAPENTIN 100 MG PO CAPS: 300.0000 mg | ORAL_CAPSULE | Freq: Every day | ORAL | 0 refills | Status: DC

## 2019-08-27 NOTE — Addendum Note (Signed)
Addended by: Rebecca Eaton on: 08/27/2019 12:37 PM   Modules accepted: Orders

## 2019-08-27 NOTE — Telephone Encounter (Signed)
Rx sent in. Patient is aware.  

## 2019-08-27 NOTE — Telephone Encounter (Signed)
Pt calling to check on this.  States that she only has enough medication to get through today.

## 2019-08-29 ENCOUNTER — Ambulatory Visit (INDEPENDENT_AMBULATORY_CARE_PROVIDER_SITE_OTHER): Payer: Medicare Other

## 2019-08-29 DIAGNOSIS — Z1159 Encounter for screening for other viral diseases: Secondary | ICD-10-CM

## 2019-09-01 ENCOUNTER — Telehealth (INDEPENDENT_AMBULATORY_CARE_PROVIDER_SITE_OTHER): Payer: Medicare Other | Admitting: Family Medicine

## 2019-09-01 ENCOUNTER — Encounter: Payer: Self-pay | Admitting: Family Medicine

## 2019-09-01 ENCOUNTER — Other Ambulatory Visit: Payer: Self-pay

## 2019-09-01 DIAGNOSIS — G629 Polyneuropathy, unspecified: Secondary | ICD-10-CM

## 2019-09-01 DIAGNOSIS — M069 Rheumatoid arthritis, unspecified: Secondary | ICD-10-CM | POA: Diagnosis not present

## 2019-09-01 DIAGNOSIS — M316 Other giant cell arteritis: Secondary | ICD-10-CM

## 2019-09-01 DIAGNOSIS — F411 Generalized anxiety disorder: Secondary | ICD-10-CM

## 2019-09-01 DIAGNOSIS — G4700 Insomnia, unspecified: Secondary | ICD-10-CM | POA: Diagnosis not present

## 2019-09-01 LAB — SARS CORONAVIRUS 2 (TAT 6-24 HRS): SARS Coronavirus 2: NEGATIVE

## 2019-09-01 MED ORDER — GABAPENTIN 300 MG PO CAPS: 300.0000 mg | ORAL_CAPSULE | Freq: Every day | ORAL | 1 refills | Status: DC

## 2019-09-01 NOTE — Progress Notes (Signed)
Virtual Visit via Telephone Note  I connected with Jennifer Hull on 09/01/19 at  2:00 PM EST by telephone and verified that I am speaking with the correct person using two identifiers.   I discussed the limitations, risks, security and privacy concerns of performing an evaluation and management service by telephone and the availability of in person appointments. I also discussed with the patient that there may be a patient responsible charge related to this service. The patient expressed understanding and agreed to proceed.  Location patient: home Location provider: work office Participants present for the call: patient, provider Patient did not have a visit in the prior 7 days to address this/these issue(s).   History of Present Illness: Jennifer Hull is a 78 yo female with Hx of hypothyroidism, GERD, depression, temporal arteritis, and RA who was last seen on 07/21/2019. Gabapentin 100 mg was added last visit, she titrated the dose up and currently she is taking 300 mg. She states that "It helped a lot" with lower extremity burning sensation and tingling. She has tolerated medication well, no side effects reported.  Medication did not help with insomnia, she is taking temazepam 15 mg daily at bedtime.  Gabapentin has not helped with anxiety either. A few weeks ago she increased dose of sertraline from 150 mg daily to 200 mg daily.  She thinks this dose helps with depression and anxiety, no side effects reported. Problem exacerbated by watching the news.  She denies suicidal thoughts.  Tomorrow she is undergoing colonoscopy and EGD due to chronic abdominal pain.  She has no new concerns today.   Observations/Objective: Patient sounds cheerful and well on the phone. I do not appreciate any SOB,cough,wheezing,or stridor. Speech and thought processing are grossly intact. + Anxious. Patient reported vitals:N/A  Assessment and Plan: 1. Peripheral polyneuropathy Great improvement with  gabapentin 300 mg daily. We discussed some side effects of medication. No changes in current management.  - gabapentin (NEURONTIN) 300 MG capsule; Take 1 capsule (300 mg total) by mouth at bedtime.  Dispense: 90 capsule; Refill: 1  2. Generalized anxiety disorder Problem still not well controlled, she feels better since she increased sertraline from 150 mg to 200 mg. She is not interested in trying a different medication. No changes in current management. She does not feel like psychotherapy will help. Instructed about warning signs.  3. Insomnia, unspecified type Continue temazepam 15 mg at bedtime. Good sleep hygiene also recommended.  4. Temporal arteritis (HCC) Asymptomatic. Continue following with rheumatologist.  5. Rheumatoid arthritis involving multiple sites, unspecified whether rheumatoid factor present [facility] Following rheumatologist.   Follow Up Instructions: Return in about 5 months (around 01/30/2020) for 4-5 months to follow on depression,anxiety,insomnia,and neuropathy.  I did not refer this patient for an OV in the next 24 hours for this/these issue(s).  I discussed the assessment and treatment plan with the patient. The patient was provided an opportunity to ask questions and all were answered. The patient agreed with the plan and demonstrated an understanding of the instructions.   The patient was advised to call back or seek an in-person evaluation if the symptoms worsen or if the condition fails to improve as anticipated.  I provided 6 minutes of non-face-to-face time during this encounter.   Bryton Waight Martinique, MD

## 2019-09-02 ENCOUNTER — Ambulatory Visit (AMBULATORY_SURGERY_CENTER): Payer: Medicare Other | Admitting: Gastroenterology

## 2019-09-02 ENCOUNTER — Other Ambulatory Visit: Payer: Self-pay

## 2019-09-02 ENCOUNTER — Encounter: Payer: Self-pay | Admitting: Gastroenterology

## 2019-09-02 VITALS — BP 112/46 | HR 70 | Temp 97.8°F | Resp 11 | Ht 63.0 in | Wt 135.0 lb

## 2019-09-02 DIAGNOSIS — K449 Diaphragmatic hernia without obstruction or gangrene: Secondary | ICD-10-CM | POA: Diagnosis not present

## 2019-09-02 DIAGNOSIS — D129 Benign neoplasm of anus and anal canal: Secondary | ICD-10-CM

## 2019-09-02 DIAGNOSIS — M069 Rheumatoid arthritis, unspecified: Secondary | ICD-10-CM | POA: Diagnosis not present

## 2019-09-02 DIAGNOSIS — K2100 Gastro-esophageal reflux disease with esophagitis, without bleeding: Secondary | ICD-10-CM

## 2019-09-02 DIAGNOSIS — K219 Gastro-esophageal reflux disease without esophagitis: Secondary | ICD-10-CM | POA: Diagnosis not present

## 2019-09-02 DIAGNOSIS — K2970 Gastritis, unspecified, without bleeding: Secondary | ICD-10-CM | POA: Diagnosis not present

## 2019-09-02 DIAGNOSIS — K5900 Constipation, unspecified: Secondary | ICD-10-CM

## 2019-09-02 DIAGNOSIS — K259 Gastric ulcer, unspecified as acute or chronic, without hemorrhage or perforation: Secondary | ICD-10-CM | POA: Diagnosis not present

## 2019-09-02 DIAGNOSIS — D123 Benign neoplasm of transverse colon: Secondary | ICD-10-CM

## 2019-09-02 DIAGNOSIS — D120 Benign neoplasm of cecum: Secondary | ICD-10-CM | POA: Diagnosis not present

## 2019-09-02 DIAGNOSIS — R1013 Epigastric pain: Secondary | ICD-10-CM | POA: Diagnosis not present

## 2019-09-02 DIAGNOSIS — D122 Benign neoplasm of ascending colon: Secondary | ICD-10-CM

## 2019-09-02 DIAGNOSIS — D128 Benign neoplasm of rectum: Secondary | ICD-10-CM

## 2019-09-02 DIAGNOSIS — K317 Polyp of stomach and duodenum: Secondary | ICD-10-CM

## 2019-09-02 DIAGNOSIS — K2990 Gastroduodenitis, unspecified, without bleeding: Secondary | ICD-10-CM

## 2019-09-02 MED ORDER — PANTOPRAZOLE SODIUM 40 MG PO TBEC: 40.0000 mg | DELAYED_RELEASE_TABLET | Freq: Two times a day (BID) | ORAL | 0 refills | Status: DC

## 2019-09-02 MED ORDER — SODIUM CHLORIDE 0.9 % IV SOLN: 500.0000 mL | Freq: Once | INTRAVENOUS | Status: DC

## 2019-09-02 NOTE — Patient Instructions (Signed)
Information on polyps, gastritis, and hiatal hernias given to you today.  Await pathology results.  Increase Protonix 40 mg to twice a day for 2 months then decrease back down to 40 mg once a day.  Use FiberCon tablets once a day.  Eat a high fiber diet.  Get TSH rechecked at primary care provider.  YOU HAD AN ENDOSCOPIC PROCEDURE TODAY AT [facility]:   Refer to the procedure report that was given to you for any specific questions about what was found during the examination.  If the procedure report does not answer your questions, please call your gastroenterologist to clarify.  If you requested that your care partner not be given the details of your procedure findings, then the procedure report has been included in a sealed envelope for you to review at your convenience later.  YOU SHOULD EXPECT: Some feelings of bloating in the abdomen. Passage of more gas than usual.  Walking can help get rid of the air that was put into your GI tract during the procedure and reduce the bloating. If you had a lower endoscopy (such as a colonoscopy or flexible sigmoidoscopy) you may notice spotting of blood in your stool or on the toilet paper. If you underwent a bowel prep for your procedure, you may not have a normal bowel movement for a few days.  Please Note:  You might notice some irritation and congestion in your nose or some drainage.  This is from the oxygen used during your procedure.  There is no need for concern and it should clear up in a day or so.  SYMPTOMS TO REPORT IMMEDIATELY:   Following lower endoscopy (colonoscopy or flexible sigmoidoscopy):  Excessive amounts of blood in the stool  Significant tenderness or worsening of abdominal pains  Swelling of the abdomen that is new, acute  Fever of 100F or higher   Following upper endoscopy (EGD)  Vomiting of blood or coffee ground material  New chest pain or pain under the shoulder blades  Painful or persistently  difficult swallowing  New shortness of breath  Fever of 100F or higher  Black, tarry-looking stools  For urgent or emergent issues, a gastroenterologist can be reached at any hour by calling [phone].   DIET:  We do recommend a small meal at first, but then you may proceed to your regular diet.  Drink plenty of fluids but you should avoid alcoholic beverages for 24 hours.  ACTIVITY:  You should plan to take it easy for the rest of today and you should NOT DRIVE or use heavy machinery until tomorrow (because of the sedation medicines used during the test).    FOLLOW UP: Our staff will call the number listed on your records 48-72 hours following your procedure to check on you and address any questions or concerns that you may have regarding the information given to you following your procedure. If we do not reach you, we will leave a message.  We will attempt to reach you two times.  During this call, we will ask if you have developed any symptoms of COVID 19. If you develop any symptoms (ie: fever, flu-like symptoms, shortness of breath, cough etc.) before then, please call [phone].  If you test positive for Covid 19 in the 2 weeks post procedure, please call and report this information to [place].    If any biopsies were taken you will be contacted by phone or by letter within the next 1-3 weeks.  Please  call us at [phone] if you have not heard about the biopsies in 3 weeks.    SIGNATURES/CONFIDENTIALITY: You and/or your care partner have signed paperwork which will be entered into your electronic medical record.  These signatures attest to the fact that that the information above on your After Visit Summary has been reviewed and is understood.  Full responsibility of the confidentiality of this discharge information lies with you and/or your care-partner.

## 2019-09-02 NOTE — Op Note (Signed)
[place] Patient Name: Jennifer Hull Procedure Date: 09/02/2019 10:35 AM MRN: [id number] Endoscopist: Justice Britain , MD Age: 78 Referring MD:  Date of Birth: Nov 25, 1940 Gender: Female Account #: [id number] Procedure:                Upper GI endoscopy Indications:              Epigastric abdominal pain, Follow-up of esophageal                            reflux Medicines:                Monitored Anesthesia Care Procedure:                Pre-Anesthesia Assessment:                           - Prior to the procedure, a History and Physical                            was performed, and patient medications and                            allergies were reviewed. The patient's tolerance of                            previous anesthesia was also reviewed. The risks                            and benefits of the procedure and the sedation                            options and risks were discussed with the patient.                            All questions were answered, and informed consent                            was obtained. Prior Anticoagulants: The patient has                            taken no previous anticoagulant or antiplatelet                            agents. ASA Grade Assessment: III - A patient with                            severe systemic disease. After reviewing the risks                            and benefits, the patient was deemed in                            satisfactory condition to undergo the procedure.  After obtaining informed consent, the endoscope was                            passed under direct vision. Throughout the                            procedure, the patient's blood pressure, pulse, and                            oxygen saturations were monitored continuously. The                            Endoscope was introduced through the mouth, and                            advanced to the second part of duodenum.  The upper                            GI endoscopy was accomplished without difficulty.                            The patient tolerated the procedure. Scope In: Scope Out: Findings:                 No gross lesions were noted in the proximal                            esophagus and in the mid esophagus.                           LA Grade A (one or more mucosal breaks less than 5                            mm, not extending between tops of 2 mucosal folds)                            esophagitis with no bleeding was found at the                            gastroesophageal junction.                           A small hiatal hernia was found. The proximal                            extent of the gastric folds (end of tubular                            esophagus) was 36 cm from the incisors. The hiatal                            narrowing was 37 cm from the incisors. The Z-line  was 35 cm from the incisors.                           Localized moderate inflammation characterized by                            erythema and granularity was found in the gastric                            antrum and in the prepyloric region of the stomach.                           A few small semi-sessile polyps with no bleeding                            and no stigmata of recent bleeding were found in                            the gastric body. 2 of these polyps were removed                            with a cold forceps for histology to rule out                            adenoma.                           No other gross lesions were noted in the entire                            examined stomach. Biopsies were taken with a cold                            forceps for histology and Helicobacter pylori                            testing.                           No gross lesions were noted in the duodenal bulb,                            in the first portion of the duodenum and in the                             second portion of the duodenum. Complications:            No immediate complications. Estimated Blood Loss:     Estimated blood loss was minimal. Impression:               - No gross lesions in esophagus proximally. LA                            Grade A esophagitis with no bleeding.                           -  Small hiatal hernia.                           - Gastritis. Biopsied.                           - A few gastric polyps. Biopsied.                           - No gross lesions in the duodenal bulb, in the                            first portion of the duodenum and in the second                            portion of the duodenum. Recommendation:           - Proceed to scheduled colonoscopy.                           - Await pathology results.                           - Observe patient's clinical course.                           - Increase PPI to 40 mg BID x 2 months and then                            decrease back down to 40 mg daily.                           - The findings and recommendations were discussed                            with the patient. Justice Britain, MD 09/02/2019 11:35:25 AM

## 2019-09-02 NOTE — Progress Notes (Signed)
History reviewed today  Temp JB VS CW

## 2019-09-02 NOTE — Op Note (Signed)
[place] Patient Name: Jennifer Hull Procedure Date: 09/02/2019 10:34 AM MRN: [id number] Endoscopist: Justice Britain , MD Age: 78 Referring MD:  Date of Birth: 1941/02/12 Gender: Female Account #: [id number] Procedure:                Colonoscopy Indications:              High risk colon cancer surveillance: Personal                            history of colonic polyps, Incidental -                            Constipation, Rectal urgency Medicines:                Monitored Anesthesia Care Procedure:                Pre-Anesthesia Assessment:                           - Prior to the procedure, a History and Physical                            was performed, and patient medications and                            allergies were reviewed. The patient's tolerance of                            previous anesthesia was also reviewed. The risks                            and benefits of the procedure and the sedation                            options and risks were discussed with the patient.                            All questions were answered, and informed consent                            was obtained. Prior Anticoagulants: The patient has                            taken no previous anticoagulant or antiplatelet                            agents. ASA Grade Assessment: III - A patient with                            severe systemic disease. After reviewing the risks                            and benefits, the patient was deemed in  satisfactory condition to undergo the procedure.                           After obtaining informed consent, the colonoscope                            was passed under direct vision. Throughout the                            procedure, the patient's blood pressure, pulse, and                            oxygen saturations were monitored continuously. The                            Colonoscope was introduced through  the anus and                            advanced to the the cecum, identified by                            appendiceal orifice and ileocecal valve. The                            colonoscopy was performed without difficulty. The                            patient tolerated the procedure. The quality of the                            bowel preparation was adequate. The ileocecal                            valve, appendiceal orifice, and rectum were                            photographed. Scope In: 10:52:33 AM Scope Out: 11:22:17 AM Scope Withdrawal Time: 0 hours 22 minutes 25 seconds  Total Procedure Duration: 0 hours 29 minutes 44 seconds  Findings:                 The digital rectal exam findings include                            hemorrhoids. Pertinent negatives include no                            palpable rectal lesions.                           Eight sessile polyps were found in the rectum (2),                            splenic flexure (1), hepatic flexure (1), ascending  colon (3) and cecum (1). The polyps were 2 to 7 mm                            in size. These polyps were removed with a cold                            snare. The 2 polyps in the rectum were nearly side                            by side and so a single cold-snare resection was                            performed of both of these lesions. Resection and                            retrieval were complete. Mild oozing noted from                            rectal lesion though no deeper defect noted and                            oozing began to slow at end of procedure.                           A diffuse area of moderately erythematous mucosa                            was found in the mid rectum and in the distal                            rectum. This was biopsied with a cold forceps for                            histology.                           Normal mucosa was found in the  entire colon                            otherwise.                           Non-bleeding non-thrombosed external and internal                            hemorrhoids were found during retroflexion, during                            perianal exam and during digital exam. The                            hemorrhoids were Grade II (internal hemorrhoids  that prolapse but reduce spontaneously). Complications:            No immediate complications. Estimated Blood Loss:     Estimated blood loss was minimal. Impression:               - Hemorrhoids found on digital rectal exam.                           - Eight 2 to 7 mm polyps in the rectum, at the                            splenic flexure, at the hepatic flexure, in the                            ascending colon and in the cecum, removed with a                            cold snare. Resected and retrieved. Mild oozing                            noted at rectal polyps resection site but slowed at                            end of procedure.                           - Erythematous mucosa in the mid rectum and in the                            distal rectum. Biopsied.                           - Otherwise, normal mucosa in the entire examined                            colon.                           - Non-bleeding non-thrombosed external and internal                            hemorrhoids. Recommendation:           - The patient will be observed post-procedure,                            until all discharge criteria are met.                           - Discharge patient to home.                           - Patient has a contact number available for                            emergencies. The signs and symptoms of  potential                            delayed complications were discussed with the                            patient. Return to normal activities tomorrow.                            Written discharge  instructions were provided to the                            patient.                           - Expect possible small amount of bleeding for                            first 24 hours post colonoscopy due to biopsies and                            resection of polyps.                           - High fiber diet.                           - Await pathology results.                           - Use FiberCon 1 tablet PO daily.                           - Can consider Amitiza 24 mcg BID vs Trulance vs                            Linzess and see if we can get discounted patient                            programs. Will have patient return to clinic to                            discuss and see if we can get some samples in                            interim if needed.                           - Recommend that TSH be rechecked to ensure her                            Thyroid remains adequately replete and no                            adjustments in medications may be needed.                           -  Await pathology results.                           - Repeat colonoscopy in 3 years for surveillance if                            patient's life expectancy and health remain at                            current state.                           - The findings and recommendations were discussed                            with the patient. Justice Britain, MD 09/02/2019 11:46:10 AM

## 2019-09-02 NOTE — Progress Notes (Signed)
Called to room to assist during endoscopic procedure.  Patient ID and intended procedure confirmed with present staff. Received instructions for my participation in the procedure from the performing physician.  

## 2019-09-02 NOTE — Progress Notes (Signed)
Report to PACU, RN, vss, BBS= Clear.  

## 2019-09-08 ENCOUNTER — Telehealth: Payer: Self-pay

## 2019-09-08 ENCOUNTER — Telehealth: Payer: Self-pay | Admitting: *Deleted

## 2019-09-08 ENCOUNTER — Telehealth: Payer: Self-pay | Admitting: Physician Assistant

## 2019-09-08 ENCOUNTER — Encounter: Payer: Self-pay | Admitting: Gastroenterology

## 2019-09-08 ENCOUNTER — Other Ambulatory Visit: Payer: Self-pay

## 2019-09-08 MED ORDER — LINACLOTIDE 290 MCG PO CAPS: 290.0000 ug | ORAL_CAPSULE | Freq: Every day | ORAL | 0 refills | Status: DC

## 2019-09-08 NOTE — Telephone Encounter (Signed)
The prescription has been sent to last until her upcoming appt.

## 2019-09-08 NOTE — Telephone Encounter (Signed)
-----   Message from Irving Copas., MD sent at 09/08/2019  8:00 AM EST ----- Regarding: Follow-up Krystin Keeven,Please schedule a follow-up with myself or Anderson Malta in approximately 3 to 4 weeks.Please also ask how she did with the Linzess samples.Thanks.GM

## 2019-09-08 NOTE — Telephone Encounter (Signed)
The pt has been scheduled to see Anderson Malta on 12/23 and has been advised and also she states she is doing well with Linzess.  She would like to have a prescription sent. Please advise

## 2019-09-08 NOTE — Telephone Encounter (Signed)
  Follow up Call-  Call back number 09/02/2019  Post procedure Call Back phone  # [phone] (husband)  Permission to leave phone message Yes  Some recent data might be hidden     Patient questions:  Do you have a fever, pain , or abdominal swelling? No. Pain Score  0 *  Have you tolerated food without any problems? Yes.    Have you been able to return to your normal activities? Yes.    Do you have any questions about your discharge instructions: Diet   No. Medications  No. Follow up visit  No.  Do you have questions or concerns about your Care? No.  Actions: * If pain score is 4 or above: No action needed, pain <4. 1. Have you developed a fever since your procedure? no  2.   Have you had an respiratory symptoms (SOB or cough) since your procedure? no  3.   Have you tested positive for COVID 19 since your procedure no  4.   Have you had any family members/close contacts diagnosed with the COVID 19 since your procedure?  no   If yes to any of these questions please route to Joylene John, RN and Alphonsa Gin, Therapist, sports.

## 2019-09-08 NOTE — Telephone Encounter (Signed)
Pt is requesting if dose for Linzess can be increased as dose that she has been taking is not helping much.

## 2019-09-08 NOTE — Telephone Encounter (Signed)
Please proceed with increased dosing to 290 mcg for 12-16 days supply to try and get her close to the clinic visit. Thanks. GM

## 2019-09-08 NOTE — Telephone Encounter (Signed)
Patient is calling back stating that the lower dosage of linzess is not working well- I asked her that she just spoke with the nurse and had stated the linzess was working and doing well and she stated "she has to take to many things for it to work." Asking for a higher dosage.

## 2019-09-08 NOTE — Telephone Encounter (Signed)
NO ANSWER, MESSAGE LEFT FOR PATIENT. 

## 2019-09-08 NOTE — Telephone Encounter (Signed)
Dr Rush Landmark please see most recent note from the pt

## 2019-09-08 NOTE — Telephone Encounter (Signed)
  Follow up Call-  Call back number 09/02/2019  Post procedure Call Back phone  # [phone] (husband)  Permission to leave phone message Yes  Some recent data might be hidden     Patient questions:  Message left to call us if necessary.

## 2019-09-10 NOTE — Telephone Encounter (Signed)
Yes, please find out what dose she is on. Thanks-JLL

## 2019-09-10 NOTE — Telephone Encounter (Signed)
Called patient and she has been taking 150mcq daily before breakfast. She requested more samples because the script sent to her pharmacy was going to cost her over $300. States she is changing her Ins the first of the year. Gave samples for 290mcq to be taken daily before breakfast

## 2019-09-16 ENCOUNTER — Telehealth: Payer: Self-pay

## 2019-09-16 NOTE — Telephone Encounter (Signed)
Received completed application for Patient. Assistance to [place] for [employer]. However patient did not include 1-proof of income or 2-a list from the Pharmacy of her out of pocket cost for all her meds. Called the patient and she will bring these documents in so we can send in the application for assisstance

## 2019-09-16 NOTE — Telephone Encounter (Signed)
Husband brought in rest of documentation for [employer] pt. Assistance application. I faxed all to [phone] and put in basket to be scanned into patient's chart

## 2019-09-17 DIAGNOSIS — M25511 Pain in right shoulder: Secondary | ICD-10-CM | POA: Diagnosis not present

## 2019-09-17 DIAGNOSIS — Z96611 Presence of right artificial shoulder joint: Secondary | ICD-10-CM | POA: Diagnosis not present

## 2019-09-23 ENCOUNTER — Telehealth: Payer: Self-pay | Admitting: Gastroenterology

## 2019-09-23 NOTE — Telephone Encounter (Signed)
Pt requested a call back to discuss Linzess.

## 2019-09-23 NOTE — Telephone Encounter (Signed)
I am not sure why this was sent to me, but it looks like Esther-RN has forwarded this message to Dennison Bulla to be advised.

## 2019-10-01 ENCOUNTER — Encounter: Payer: Self-pay | Admitting: Physician Assistant

## 2019-10-01 ENCOUNTER — Ambulatory Visit (INDEPENDENT_AMBULATORY_CARE_PROVIDER_SITE_OTHER): Payer: Medicare Other | Admitting: Physician Assistant

## 2019-10-01 ENCOUNTER — Encounter: Payer: Self-pay | Admitting: Family Medicine

## 2019-10-01 VITALS — BP 100/58 | HR 80 | Temp 98.2°F | Ht 60.75 in | Wt 134.1 lb

## 2019-10-01 DIAGNOSIS — K5900 Constipation, unspecified: Secondary | ICD-10-CM

## 2019-10-01 DIAGNOSIS — R1013 Epigastric pain: Secondary | ICD-10-CM

## 2019-10-01 NOTE — Patient Instructions (Signed)
If you are age 77 or older, your body mass index should be between 23-30. Your Body mass index is 25.55 kg/m. If this is out of the aforementioned range listed, please consider follow up with your Primary Care Provider.  If you are age 3 or younger, your body mass index should be between 19-25. Your Body mass index is 25.55 kg/m. If this is out of the aformentioned range listed, please consider follow up with your Primary Care Provider.    Follow up as needed  I appreciate the  opportunity to care for you  Thank You   [facility]

## 2019-10-01 NOTE — Progress Notes (Signed)
The patient is scheduled for 01/30/2020 at 9 AM

## 2019-10-01 NOTE — Progress Notes (Signed)
Chief Complaint: Constipation  HPI:    Jennifer Hull is a 78 year old female with a past medical history as listed below, known to Dr. Rush Landmark, who returns to clinic today for follow-up of her constipation.    08/25/2019 patient was seen in clinic and continue to describe constipation regardless of [place].  At that time she was continued on Pantoprazole 40 mg daily and set up for an EGD and colonoscopy.    09/02/2019 colonoscopy with hemorrhoids, eight 2-7 mm polyps in the rectum, splenic flexure, hepatic flexure, ascending colon and cecum, erythematous mucosa in the mid rectum and distal rectum, nonbleeding nonthrombosed external and internal hemorrhoids.  EGD on the same day with small hiatal hernia, gastritis and no gross lesions in the duodenal bulb, first part of the duodenum or second part of the duodenum.  Patient's PPI was increased to 40 mg twice daily x2 months and then decrease back down to once daily.  Pathology significant for gastritis and a mixture of tubular adenomas and sessile serrated polyps.    Today, the patient tells me that she is much better.  She uses the Linzess every other day or every third day and in between uses the [employer].  She is able to maintain regular bowel movements and has no bloating or abdominal pain.  Also using her Pantoprazole 40 twice daily for now with plans to decrease this to once daily after 2 months per recommendations from Dr. Rush Landmark.  Patient is very thankful that she feels better.    Social history positive for the fact the patient brought me a tin full of cookies today.    Denies fever or chills.  Past Medical History:  Diagnosis Date  . Anxiety   . Arthritis   . Depression   . Hyperlipidemia   . Rheumatoid arthritis ([place]) 2019  . Temporal arteritis ([place])   . Thyroid disease     Past Surgical History:  Procedure Laterality Date  . ABDOMINAL HYSTERECTOMY    . BREAST EXCISIONAL BIOPSY Left   . BREAST SURGERY     biopsy  .  COLONOSCOPY    . TEMPORAL ARTERY BIOPSY / LIGATION Bilateral 2014  . TONSILLECTOMY    . 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 acetaminophen (TYLENOL) 500 MG tablet Take by mouth.    Marland Kitchen atorvastatin (LIPITOR) 10 MG tablet Take 1 tablet by mouth once daily 90 tablet 0  . Calcium Carb-Cholecalciferol (CALCIUM 600+D3 PO) Take 1 tablet by mouth daily with lunch.    . cetirizine (ZYRTEC) 10 MG tablet Take 10 mg by mouth daily.    Arna Medici 112 MCG tablet TAKE 1 TABLET BY MOUTH ONCE DAILY BEFORE BREAKFAST 30 tablet 0  . folic acid (FOLVITE) 1 MG tablet Take 1 mg by mouth daily.     Marland Kitchen gabapentin (NEURONTIN) 300 MG capsule Take 1 capsule (300 mg total) by mouth at bedtime. 90 capsule 1  . linaclotide (LINZESS) 290 MCG CAPS capsule Take 1 capsule (290 mcg total) by mouth daily before breakfast for 25 days. 25 capsule 0  . metoCLOPramide (REGLAN) 10 MG tablet Take 1 tablet 30 minutes prior to each prep for colonoscopy. 2 tablet 0  . pantoprazole (PROTONIX) 40 MG tablet Take 1 tablet (40 mg total) by mouth 2 (two) times daily. 90 tablet  0  . Probiotic Product (PROBIOTIC PO) Take 1 capsule by mouth daily.    . sertraline (ZOLOFT) 100 MG tablet Take 2 tablets (200 mg total) by mouth daily. 180 tablet 1  . temazepam (RESTORIL) 15 MG capsule TAKE 1 CAPSULE BY MOUTH AT BEDTIME AS NEEDED FOR SLEEP 30 capsule 3  . Tocilizumab (ACTEMRA IV) Inject into the vein every 30 (thirty) days.     No current facility-administered medications for this visit.    Allergies as of 10/01/2019 - Review Complete 09/02/2019  Allergen Reaction Noted  . Other  07/27/2016  . Tdap [tetanus-diphth-acell pertussis] Other (See Comments) 07/27/2016  . Tetanus toxoid, adsorbed  12/07/2017    Family History    Problem Relation Age of Onset  . Heart disease Mother   . Kidney cancer Mother   . Heart disease Father   . Breast cancer Maternal Aunt   . Colon cancer Neg Hx   . Esophageal cancer Neg Hx   . Rectal cancer Neg Hx   . Stomach cancer Neg Hx     Social History   Socioeconomic History  . Marital status: Married    Spouse name: Not on file  . Number of children: Not on file  . Years of education: Not on file  . Highest education level: Not on file  Occupational History  . Not on file  Tobacco Use  . Smoking status: Former Smoker    Types: Cigarettes  . Smokeless tobacco: Never Used  . Tobacco comment: smoked infrequently when she was young; quit 78 yo   Substance and Sexual Activity  . Alcohol use: No  . Drug use: No  . Sexual activity: Yes    Partners: Male    Birth control/protection: Surgical  Other Topics Concern  . Not on file  Social History Narrative  . Not on file   Social Determinants of Health   Financial Resource Strain:   . Difficulty of Paying Living Expenses: Not on file  Food Insecurity:   . Worried About Charity fundraiser in the Last Year: Not on file  . Ran Out of Food in the Last Year: Not on file  Transportation Needs:   . Lack of Transportation (Medical): Not on file  . Lack of Transportation (Non-Medical): Not on file  Physical Activity:   . Days of Exercise per Week: Not on file  . Minutes of Exercise per Session: Not on file  Stress:   . Feeling of Stress : Not on file  Social Connections:   . Frequency of Communication with Friends and Family: Not on file  . Frequency of Social Gatherings with Friends and Family: Not on file  . Attends Religious Services: Not on file  . Active Member of Clubs or Organizations: Not on file  . Attends Archivist Meetings: Not on file  . Marital Status: Not on file  Intimate Partner Violence:   . Fear of Current or Ex-Partner: Not on file  . Emotionally Abused: Not on file  . Physically  Abused: Not on file  . Sexually Abused: Not on file    Review of Systems:    Constitutional: No weight loss, fever or chills Cardiovascular: No chest pain Respiratory: No SOB  Gastrointestinal: See HPI and otherwise negative   Physical Exam:  Vital signs: Temp 98.2 F (36.8 C)   Ht 5' 0.75" (1.543 m) Comment: height measured without shoes  Wt 134 lb 2 oz (60.8 kg)   BMI 25.55 kg/m  Constitutional:   Pleasant Caucasian female appears to be in NAD, Well developed, Well nourished, alert and cooperative Respiratory: Respirations even and unlabored. Lungs clear to auscultation bilaterally.   No wheezes, crackles, or rhonchi.  Cardiovascular: Normal S1, S2. No MRG. Regular rate and rhythm. No peripheral edema, cyanosis or pallor.  Gastrointestinal:  Soft, nondistended, nontender. No rebound or guarding. Normal bowel sounds. No appreciable masses or hepatomegaly. Rectal:  Not performed. Psychiatric: Demonstrates good judgement and reason without abnormal affect or behaviors.  No recent labs or imaging.  Assessment: 1.  Constipation: Relieved with Linzess every other day and FiberCon, recent colonoscopy with polyps and otherwise normal 2.  Epigastric pain: Better with Pantoprazole 40 mg twice daily, EGD recently with gastritis  Plan: 1.  Patient will continue on Pantoprazole 40 mg twice daily for a total of 2 months, then decrease to Pantoprazole 40 mg once daily 2.  Reviewed recent EGD and colonoscopy findings and recommendations.  Answered patient's questions. 3.  Patient will continue using Linzess every other day as well as FiberCon for constipation 4.  Patient to follow in clinic as needed with Dr. Rush Landmark or me.  Ellouise Newer, PA-C [facility] 10/01/2019, 1:23 PM  Cc: Martinique, Betty G, MD

## 2019-10-06 NOTE — Progress Notes (Signed)
Attending Physician's Attestation   I have reviewed the chart.   I agree with the Advanced Practitioner's note, impression, and recommendations with any updates as below.    Everly Rubalcava Mansouraty, MD [facility] Advanced [facility]oscopy Office # [phone]  

## 2019-10-21 ENCOUNTER — Other Ambulatory Visit: Payer: Self-pay | Admitting: *Deleted

## 2019-10-21 MED ORDER — LINACLOTIDE 290 MCG PO CAPS: 290.0000 ug | ORAL_CAPSULE | Freq: Every day | ORAL | 3 refills | Status: DC

## 2019-10-21 NOTE — Telephone Encounter (Signed)
Script faxed to patient assistance program.

## 2019-10-22 ENCOUNTER — Telehealth: Payer: Self-pay | Admitting: Physician Assistant

## 2019-10-22 DIAGNOSIS — M0579 Rheumatoid arthritis with rheumatoid factor of multiple sites without organ or systems involvement: Secondary | ICD-10-CM | POA: Diagnosis not present

## 2019-10-27 NOTE — Telephone Encounter (Signed)
Sent patient a Pharmacist, community message that I had some more samples of Linzess if she needed any while we waited for her approved prescription to be shipped to our office.

## 2019-11-04 ENCOUNTER — Encounter: Payer: Self-pay | Admitting: Family Medicine

## 2019-11-06 DIAGNOSIS — J343 Hypertrophy of nasal turbinates: Secondary | ICD-10-CM | POA: Diagnosis not present

## 2019-11-06 DIAGNOSIS — R040 Epistaxis: Secondary | ICD-10-CM | POA: Diagnosis not present

## 2019-11-07 ENCOUNTER — Encounter: Payer: Self-pay | Admitting: Family Medicine

## 2019-11-12 ENCOUNTER — Other Ambulatory Visit: Payer: Self-pay | Admitting: Family Medicine

## 2019-11-12 DIAGNOSIS — E039 Hypothyroidism, unspecified: Secondary | ICD-10-CM

## 2019-11-12 NOTE — Telephone Encounter (Signed)
Linzess has arrived and I sent mychart message to patient to let her know she could come pick it up.  She agreed.

## 2019-11-13 NOTE — Telephone Encounter (Signed)
Last OV 09/01/19 Last refill 4/34/20 #30/0 Next OV 01/30/20

## 2019-11-17 DIAGNOSIS — R040 Epistaxis: Secondary | ICD-10-CM | POA: Diagnosis not present

## 2019-11-19 DIAGNOSIS — M0579 Rheumatoid arthritis with rheumatoid factor of multiple sites without organ or systems involvement: Secondary | ICD-10-CM | POA: Diagnosis not present

## 2019-12-08 ENCOUNTER — Other Ambulatory Visit: Payer: Self-pay | Admitting: Family Medicine

## 2019-12-08 DIAGNOSIS — F411 Generalized anxiety disorder: Secondary | ICD-10-CM

## 2019-12-08 DIAGNOSIS — F331 Major depressive disorder, recurrent, moderate: Secondary | ICD-10-CM

## 2019-12-08 DIAGNOSIS — G4700 Insomnia, unspecified: Secondary | ICD-10-CM

## 2019-12-08 NOTE — Telephone Encounter (Signed)
Rx last filled 10/30/19.

## 2019-12-17 ENCOUNTER — Other Ambulatory Visit: Payer: Self-pay | Admitting: Family Medicine

## 2019-12-17 DIAGNOSIS — M0579 Rheumatoid arthritis with rheumatoid factor of multiple sites without organ or systems involvement: Secondary | ICD-10-CM | POA: Diagnosis not present

## 2019-12-17 DIAGNOSIS — E785 Hyperlipidemia, unspecified: Secondary | ICD-10-CM

## 2019-12-18 ENCOUNTER — Other Ambulatory Visit: Payer: Self-pay | Admitting: Family Medicine

## 2019-12-18 DIAGNOSIS — E039 Hypothyroidism, unspecified: Secondary | ICD-10-CM

## 2019-12-25 DIAGNOSIS — M5136 Other intervertebral disc degeneration, lumbar region: Secondary | ICD-10-CM | POA: Diagnosis not present

## 2019-12-25 DIAGNOSIS — M8589 Other specified disorders of bone density and structure, multiple sites: Secondary | ICD-10-CM | POA: Diagnosis not present

## 2019-12-25 DIAGNOSIS — M2550 Pain in unspecified joint: Secondary | ICD-10-CM | POA: Diagnosis not present

## 2019-12-25 DIAGNOSIS — Z6823 Body mass index (BMI) 23.0-23.9, adult: Secondary | ICD-10-CM | POA: Diagnosis not present

## 2019-12-25 DIAGNOSIS — G8929 Other chronic pain: Secondary | ICD-10-CM | POA: Diagnosis not present

## 2019-12-25 DIAGNOSIS — Z7952 Long term (current) use of systemic steroids: Secondary | ICD-10-CM | POA: Diagnosis not present

## 2019-12-25 DIAGNOSIS — M5416 Radiculopathy, lumbar region: Secondary | ICD-10-CM | POA: Diagnosis not present

## 2019-12-25 DIAGNOSIS — M25511 Pain in right shoulder: Secondary | ICD-10-CM | POA: Diagnosis not present

## 2019-12-25 DIAGNOSIS — M316 Other giant cell arteritis: Secondary | ICD-10-CM | POA: Diagnosis not present

## 2019-12-25 DIAGNOSIS — M0589 Other rheumatoid arthritis with rheumatoid factor of multiple sites: Secondary | ICD-10-CM | POA: Diagnosis not present

## 2019-12-25 DIAGNOSIS — R928 Other abnormal and inconclusive findings on diagnostic imaging of breast: Secondary | ICD-10-CM | POA: Diagnosis not present

## 2019-12-25 DIAGNOSIS — R768 Other specified abnormal immunological findings in serum: Secondary | ICD-10-CM | POA: Diagnosis not present

## 2020-01-07 ENCOUNTER — Encounter: Payer: Self-pay | Admitting: Family Medicine

## 2020-01-15 DIAGNOSIS — Z7952 Long term (current) use of systemic steroids: Secondary | ICD-10-CM | POA: Diagnosis not present

## 2020-01-15 DIAGNOSIS — M0579 Rheumatoid arthritis with rheumatoid factor of multiple sites without organ or systems involvement: Secondary | ICD-10-CM | POA: Diagnosis not present

## 2020-01-16 ENCOUNTER — Emergency Department (HOSPITAL_COMMUNITY)
Admission: EM | Admit: 2020-01-16 | Discharge: 2020-01-17 | Disposition: A | Discharge status: Home / Self Care | Payer: Medicare Other | Attending: Emergency Medicine | Admitting: Emergency Medicine

## 2020-01-16 ENCOUNTER — Encounter (HOSPITAL_COMMUNITY): Payer: Self-pay | Admitting: Emergency Medicine

## 2020-01-16 ENCOUNTER — Other Ambulatory Visit: Payer: Self-pay

## 2020-01-16 DIAGNOSIS — Z87891 Personal history of nicotine dependence: Secondary | ICD-10-CM | POA: Diagnosis not present

## 2020-01-16 DIAGNOSIS — R21 Rash and other nonspecific skin eruption: Secondary | ICD-10-CM | POA: Diagnosis not present

## 2020-01-16 DIAGNOSIS — Z79899 Other long term (current) drug therapy: Secondary | ICD-10-CM | POA: Diagnosis not present

## 2020-01-16 NOTE — ED Triage Notes (Signed)
Patient reports itchy skin rashes at bilateral forearms onset today , denies SOB/respirations unlabored , patient added itching at injection site of Covid vaccination at left deltoid .

## 2020-01-17 DIAGNOSIS — R21 Rash and other nonspecific skin eruption: Secondary | ICD-10-CM | POA: Diagnosis not present

## 2020-01-17 MED ORDER — HYDROXYZINE HCL 25 MG PO TABS: 25.0000 mg | ORAL_TABLET | Freq: Four times a day (QID) | ORAL | 0 refills | Status: DC

## 2020-01-17 MED ORDER — HYDROXYZINE HCL 25 MG PO TABS
25.0000 mg | ORAL_TABLET | Freq: Once | ORAL | Status: AC
  Administered 2020-01-17: 25 mg via ORAL
  Filled 2020-01-17: qty 1

## 2020-01-17 NOTE — Discharge Instructions (Addendum)
Please continue your prednisone as prescribed.

## 2020-01-17 NOTE — ED Notes (Signed)
Pt d/c home per MD order. Discharge summary reviewed with pt, pt verbalizes understanding. Off unit via [place]- Discharged home with husband

## 2020-01-17 NOTE — ED Provider Notes (Signed)
CHIEF COMPLAINT: rash  HPI: Patient is a 79 year old female who presents to emergency department with rash around the site where she received her Covid vaccine 6 days ago and diffuse pruritus.  She has had pain, redness and swelling around her injection site in the left deltoid.  No other rash noted.  No lip or tongue swelling.  No difficulty breathing, swallowing or speaking.  States she has been using Benadryl.  Last dose 8 PM last night.  Reports minimal relief with this medication.  Started a prednisone taper that she had leftover from her neurosurgeon last night.  Took 60 mg with no relief.  ROS: See HPI Constitutional: no fever  Eyes: no drainage  ENT: no runny nose   Cardiovascular:  no chest pain  Resp: no SOB  GI: no vomiting GU: no dysuria Integumentary:  rash  Allergy: no hives  Musculoskeletal: no leg swelling  Neurological: no slurred speech ROS otherwise negative  PAST MEDICAL HISTORY/PAST SURGICAL HISTORY:  Past Medical History:  Diagnosis Date  . Anxiety   . Arthritis   . Depression   . Hyperlipidemia   . Rheumatoid arthritis ([place]) 2019  . Temporal arteritis ([place])   . Thyroid disease     MEDICATIONS:  Prior to Admission medications   Medication Sig Start Date End Date Taking? Authorizing Provider  acetaminophen (TYLENOL) 500 MG tablet Take by mouth.    [provider]  atorvastatin (LIPITOR) 10 MG tablet Take 1 tablet by mouth once daily 12/19/19   Martinique, Betty G, MD  Calcium Carb-Cholecalciferol (CALCIUM 600+D3 PO) Take 1 tablet by mouth daily with lunch.    [provider]  cetirizine (ZYRTEC) 10 MG tablet Take 10 mg by mouth daily.    [provider]  EUTHYROX 112 MCG tablet TAKE 1 TABLET BY MOUTH ONCE DAILY BEFORE BREAKFAST 12/19/19   Martinique, Betty G, MD  folic acid (FOLVITE) 1 MG tablet Take 1 mg by mouth daily.     [provider]  gabapentin (NEURONTIN) 300 MG capsule Take 1 capsule (300 mg total) by mouth at bedtime.  09/01/19   Martinique, Betty G, MD  hydroxychloroquine (PLAQUENIL) 200 MG tablet Take 1 tablet by mouth daily. 09/25/19   [provider]  linaclotide (LINZESS) 290 MCG CAPS capsule Take 1 capsule (290 mcg total) by mouth daily before breakfast for 25 days. 10/21/19 11/15/19  Levin Erp, PA  pantoprazole (PROTONIX) 40 MG tablet Take 1 tablet (40 mg total) by mouth 2 (two) times daily. 09/02/19   Mansouraty, Telford Nab., MD  Probiotic Product (PROBIOTIC PO) Take 1 capsule by mouth daily.    [provider]  sertraline (ZOLOFT) 100 MG tablet TAKE 1 & 1/2 (ONE & ONE-HALF) TABLETS BY MOUTH ONCE DAILY 12/08/19   Martinique, Betty G, MD  temazepam (RESTORIL) 15 MG capsule TAKE 1 CAPSULE BY MOUTH AT BEDTIME AS NEEDED FOR SLEEP 12/10/19   Martinique, Betty G, MD  Tocilizumab (ACTEMRA IV) Inject into the vein every 30 (thirty) days.    [provider]    ALLERGIES:  Allergies  Allergen Reactions  . Other     SEVERE ALLERGIC REACTIONS (NECK STIFFNESS/HIGH FEVER/ETC) Other reaction(s): Other (See Comments) SEVERE ALLERGIC REACTIONS (NECK STIFFNESS/HIGH FEVER/ETC)  . Tdap [Tetanus-Diphth-Acell Pertussis] Other (See Comments)    SEVERE ALLERGIC REACTIONS (NECK STIFFNESS/HIGH FEVER/ETC)  . Tetanus Toxoid, Adsorbed     Neck stiffness, and high fever Other reaction(s): Other (See Comments) Neck stiffness, and high fever    SOCIAL HISTORY:  Social History   Tobacco Use  . Smoking status: Former Smoker    Types: Cigarettes  . Smokeless tobacco: Never Used  . Tobacco comment: smoked infrequently when she was young; quit 79 yo   Substance Use Topics  . Alcohol use: No    FAMILY HISTORY: Family History  Problem Relation Age of Onset  . Heart disease Mother   . Kidney cancer Mother   . Heart disease Father   . Breast cancer Maternal Aunt   . Colon cancer Neg Hx   . Esophageal cancer Neg Hx   . Rectal cancer Neg Hx   . Stomach cancer Neg Hx     EXAM: BP 120/61   Pulse  (!) 57   Temp 97.8 F (36.6 C) (Oral)   Resp 16   Ht 5\' 3"  (1.6 m)   Wt 65 kg   SpO2 95%   BMI 25.38 kg/m  CONSTITUTIONAL: Alert and oriented and responds appropriately to questions. Well-appearing; well-nourished HEAD: Normocephalic EYES: Conjunctivae clear, pupils appear equal, EOM appear intact ENT: normal nose; moist mucous membranes, no angioedema, no trismus or drooling, no stridor, normal phonation NECK: Supple, normal ROM CARD: RRR; S1 and S2 appreciated; no murmurs, no clicks, no rubs, no gallops RESP: Normal chest excursion without splinting or tachypnea; breath sounds clear and equal bilaterally; no wheezes, no rhonchi, no rales, no hypoxia or respiratory distress, speaking full sentences ABD/GI: Normal bowel sounds; non-distended; soft, non-tender, no rebound, no guarding, no peritoneal signs, no hepatosplenomegaly BACK:  The back appears normal EXT: Normal ROM in all joints; no deformity noted, no edema; no cyanosis SKIN: Normal color for age and race; warm; patient has an area of redness without significant warmth and some soft tissue swelling to the left deltoid.  There is no petechiae or purpura.  No blisters or desquamation.  She has areas of excoriation to her bilateral forearms but no other rash noted.  No hives. NEURO: Moves all extremities equally PSYCH: The patient's mood and manner are appropriate.   MEDICAL DECISION MAKING: Patient here with rash that started after COVID-19 vaccine.  No sign of cellulitis or abscess on exam.  No sign of life-threatening rash.  No sign of SJS, TEN.  No fever.  Otherwise extremely well-appearing here, nontoxic, afebrile.  Already taking prednisone.  Will give Atarax for symptomatic relief.  ED PROGRESS: Patient feels better after Atarax.  Requesting discharge home.  Will provide prescription of same and have her continue prednisone taper at home.  Recommended PCP follow-up.  I do not feel she needs antibiotics or further emergent  work-up.  At this time, I do not feel there is any life-threatening condition present. I have reviewed, interpreted and discussed all results (EKG, imaging, lab, urine as appropriate) and exam findings with patient/family. I have reviewed nursing notes and appropriate previous records.  I feel the patient is safe to be discharged home without further emergent workup and can continue workup as an outpatient as needed. Discussed usual and customary return precautions. Patient/family verbalize understanding and are comfortable with this plan.  Outpatient follow-up has been provided as needed. All questions have been answered.   Marielys Bedonie Petraglia was evaluated in Emergency Department on 01/17/2020 for the symptoms described in the history of present illness. She was evaluated in the context of the global COVID-19 pandemic, which necessitated consideration that the patient might be at risk for infection with the SARS-CoV-2 virus that causes COVID-19. Institutional protocols and algorithms that pertain to the evaluation  of patients at risk for COVID-19 are in a state of rapid change based on information released by regulatory bodies including the CDC and federal and state organizations. These policies and algorithms were followed during the patient's care in the ED.      Zaidan Keeble, Delice Bison, DO 01/17/20 [phone]

## 2020-01-29 ENCOUNTER — Other Ambulatory Visit: Payer: Self-pay

## 2020-01-30 ENCOUNTER — Encounter: Payer: Self-pay | Admitting: Family Medicine

## 2020-01-30 ENCOUNTER — Ambulatory Visit (INDEPENDENT_AMBULATORY_CARE_PROVIDER_SITE_OTHER): Payer: Medicare Other | Admitting: Family Medicine

## 2020-01-30 ENCOUNTER — Encounter: Payer: Self-pay | Admitting: *Deleted

## 2020-01-30 VITALS — BP 100/62 | HR 63 | Temp 97.9°F | Resp 16 | Ht 63.0 in | Wt 136.0 lb

## 2020-01-30 DIAGNOSIS — E039 Hypothyroidism, unspecified: Secondary | ICD-10-CM

## 2020-01-30 DIAGNOSIS — M069 Rheumatoid arthritis, unspecified: Secondary | ICD-10-CM

## 2020-01-30 DIAGNOSIS — F411 Generalized anxiety disorder: Secondary | ICD-10-CM | POA: Diagnosis not present

## 2020-01-30 DIAGNOSIS — F331 Major depressive disorder, recurrent, moderate: Secondary | ICD-10-CM | POA: Diagnosis not present

## 2020-01-30 DIAGNOSIS — E785 Hyperlipidemia, unspecified: Secondary | ICD-10-CM

## 2020-01-30 DIAGNOSIS — G4700 Insomnia, unspecified: Secondary | ICD-10-CM

## 2020-01-30 DIAGNOSIS — Z0000 Encounter for general adult medical examination without abnormal findings: Secondary | ICD-10-CM

## 2020-01-30 DIAGNOSIS — G629 Polyneuropathy, unspecified: Secondary | ICD-10-CM | POA: Diagnosis not present

## 2020-01-30 LAB — LIPID PANEL
Cholesterol: 160 mg/dL (ref 0–200)
HDL: 57.6 mg/dL (ref >39.00)
NonHDL: 102.53
Total CHOL/HDL Ratio: 3
Triglycerides: 243 mg/dL — ABNORMAL HIGH (ref 0.0–149.0)
VLDL: 48.6 mg/dL — ABNORMAL HIGH (ref 0.0–40.0)

## 2020-01-30 LAB — TSH: TSH: 1.88 u[IU]/mL (ref 0.35–4.50)

## 2020-01-30 LAB — LDL CHOLESTEROL, DIRECT: Direct LDL: 67 mg/dL

## 2020-01-30 MED ORDER — GABAPENTIN 300 MG PO CAPS: 300.0000 mg | ORAL_CAPSULE | Freq: Two times a day (BID) | ORAL | 3 refills | Status: DC

## 2020-01-30 NOTE — Assessment & Plan Note (Signed)
Problem is stable. No changes in current management. Good sleep hygiene to continue.

## 2020-01-30 NOTE — Patient Instructions (Addendum)
  Ms. Wolny , Thank you for taking time to come for your Medicare Wellness Visit. I appreciate your ongoing commitment to your health goals. Please review the following plan we discussed and let me know if I can assist you in the future.   These are the goals we discussed: Goals    . Patient Stated     Wants to feel better and be more mobile        This is a list of the screening recommended for you and due dates:  Health Maintenance  Topic Date Due  . COVID-19 Vaccine (1) Never done  . Flu Shot  05/09/2020  . DEXA scan (bone density measurement)  Completed  . Pneumonia vaccines  Completed   No cambios hoy. La veo otra vez en 5-6 meses.

## 2020-01-30 NOTE — Assessment & Plan Note (Signed)
Still having exacerbations. Continue following with rheumatologist.

## 2020-01-30 NOTE — Assessment & Plan Note (Signed)
Continue atorvastatin 10 mg daily and low-fat diet. Further recommendation will be given according to lipid panel results (not fasting).

## 2020-01-30 NOTE — Assessment & Plan Note (Signed)
No changes in current management, will follow labs done today and will give further recommendations accordingly.  

## 2020-01-30 NOTE — Progress Notes (Signed)
Chief Complaint  Patient presents with  . Follow-up    4-5 month follow-up  . Medicare Wellness    HPI: [name] is a 79 y.o. female, who is here today for AWV and chronic disease management.   She was last seen on 09/01/19. Last AWV 01/01/19.  Since her last visit she has been in the ER for local reaction on COVID 19 vaccine site.  She lives with her husband.. Independent ADL's and IADL's. No falls in the past year but almost did a few weeks ago while taking a shower. Negative for depression symptoms.  Mammogram: 06/18/2019. DEXA: 05/11/2017.  History of osteoporosis, she completed 6 years of Fosamax..  She is not interested in more medications.  Immunization History  Administered Date(s) Administered  . Influenza, High Dose Seasonal PF 06/27/2017, 07/02/2018  . Pneumococcal Conjugate-13 06/27/2017  . Pneumococcal Polysaccharide-23 07/02/2018   She completed COVID-19 vaccination, [employer].  Developed rash after second dose, states that she will not get more vaccines, even if booster is recommended.  Functional Status Survey: Is the patient deaf or have difficulty hearing?: No Does the patient have difficulty seeing, even when wearing glasses/contacts?: No Does the patient have difficulty concentrating, remembering, or making decisions?: No Does the patient have difficulty walking or climbing stairs?: Yes(Sometimes due to back pain and arthralgias.) Does the patient have difficulty dressing or bathing?: No Does the patient have difficulty doing errands alone such as visiting a doctor's office or shopping?: No  Fall Risk  01/30/2020 01/30/2020 01/01/2019 12/25/2017  Falls in the past year? 0 0 0 Yes  Number falls in past yr: 0 - 0 2 or more  Injury with Fall? 0 0 1 Yes  Comment - - - does not know why  Risk for fall due to : - - Orthopedic patient (No Data)  Risk for fall due to: Comment - - - fell in around the bathroom in Oct  Follow up Education provided - -  Education provided   Providers she sees regularly: Eye care provider: Dr Ventura Bruns, annual visits, next appt 04/2020. Rheumatologist, Dr. Suezanne Cheshire.   She sees orthopedist prn. Pain management: Dr Edson Snowball  Depression screen [facility] 2/9 01/30/2020  Decreased Interest 1  Down, Depressed, Hopeless 1  PHQ - 2 Score 2  Altered sleeping 2  Tired, decreased energy 1  Change in appetite 0  Feeling bad or failure about yourself  0  Trouble concentrating 2  Moving slowly or fidgety/restless 0  Suicidal thoughts 0  PHQ-9 Score 7  Difficult doing work/chores Somewhat difficult   GAD 7 : Generalized Anxiety Score 01/30/2020  Nervous, Anxious, on Edge 1  Control/stop worrying 0  Worry too much - different things 0  Trouble relaxing 0  Restless 0  Easily annoyed or irritable 0  Afraid - awful might happen 0  Total GAD 7 Score 1  Anxiety Difficulty Not difficult at all   Mini-Cog - 01/30/20 1435    How many words correct?  3        Hearing Screening   125Hz  250Hz  500Hz  1000Hz  2000Hz  3000Hz  4000Hz  6000Hz  8000Hz   Right ear:           Left ear:           Vision Screening Comments: Refused.  Right sided lower back pain, mild. Radiated to RLE ,posterior aspect,mainly behind knee. Epidural injections have helped.  Peripheral neuropathy: Gabapentin has helped with numbness of LE's.. Dose was increased from 300 mg  at bedtime to 300 mg bid.  Anxiety,depression and insomnia: She is on Sertraline 150 mg daily and Temazepam 15 mg daily at bedtime as needed. Also takes Melatonin 10 mg. She decreased Sertraline down to 100 mg but Gabapentin caused anxiety, increased it to 150 mg.  Lab Results  Component Value Date   TSH 2.71 05/31/2018   HLD: She is on Atorvastatin 10 mg daily. Tolerating medications well.  Lab Results  Component Value Date   CHOL 200 02/24/2019   HDL 57.50 02/24/2019   LDLCALC 116 (H) 02/24/2019   TRIG 135.0 02/24/2019   CHOLHDL 3 02/24/2019   According to pt, her rheuma  checked TG and and according to pt, it was elevated.  Constipation: Linzess is helping, she is also taking daily fiber supplement. Having daily bowel movements. Colonoscopy on 09/02/2019, multiple polypectomy, 8 of them premalignant.  Colonoscopy was recommended in 3 years.  Hypothyroidism: Currently she is on Levothyroxine 112 mcg daily. Negative for worsening fatigue,fever, cold/heat intolerance,tremor,or abnormal wt loss.  Lab Results  Component Value Date   TSH 2.71 05/31/2018   Review of Systems  Constitutional: Negative for activity change, appetite change and fever.  HENT: Negative for mouth sores, nosebleeds and sore throat.   Eyes: Negative for redness and visual disturbance.  Respiratory: Negative for cough, shortness of breath and wheezing.   Cardiovascular: Negative for chest pain, palpitations and leg swelling.  Gastrointestinal: Negative for abdominal pain, nausea and vomiting.       Negative for changes in bowel habits.  Genitourinary: Negative for decreased urine volume, dysuria and hematuria.  Musculoskeletal: Positive for arthralgias and back pain. Negative for gait problem.  Allergic/Immunologic: Positive for environmental allergies.  Neurological: Negative for syncope, weakness and headaches.  Psychiatric/Behavioral: Positive for sleep disturbance. Negative for confusion and hallucinations. The patient is nervous/anxious.   Rest of ROS, see pertinent positives sand negatives in HPI  Current Outpatient Medications on File Prior to Visit  Medication Sig Dispense Refill  . acetaminophen (TYLENOL) 500 MG tablet Take by mouth.    Marland Kitchen atorvastatin (LIPITOR) 10 MG tablet Take 1 tablet by mouth once daily 90 tablet 1  . Calcium Carb-Cholecalciferol (CALCIUM 600+D3 PO) Take 1 tablet by mouth daily with lunch.    . cetirizine (ZYRTEC) 10 MG tablet Take 10 mg by mouth daily.    Arna Medici 112 MCG tablet TAKE 1 TABLET BY MOUTH ONCE DAILY BEFORE BREAKFAST 30 tablet 2  . folic  acid (FOLVITE) 1 MG tablet Take 1 mg by mouth daily.     . hydroxychloroquine (PLAQUENIL) 200 MG tablet Take 1.5 tablets by mouth daily.    Marland Kitchen linaclotide (LINZESS) 290 MCG CAPS capsule Take by mouth.    . pantoprazole (PROTONIX) 40 MG tablet Take 1 tablet (40 mg total) by mouth 2 (two) times daily. (Patient taking differently: Take 40 mg by mouth daily. ) 90 tablet 0  . Probiotic Product (PROBIOTIC PO) Take 1 capsule by mouth daily.    . sertraline (ZOLOFT) 100 MG tablet TAKE 1 & 1/2 (ONE & ONE-HALF) TABLETS BY MOUTH ONCE DAILY 135 tablet 0  . temazepam (RESTORIL) 15 MG capsule TAKE 1 CAPSULE BY MOUTH AT BEDTIME AS NEEDED FOR SLEEP 30 capsule 3  . Tocilizumab (ACTEMRA IV) Inject into the vein every 30 (thirty) days.     No current facility-administered medications on file prior to visit.    Past Medical History:  Diagnosis Date  . Anxiety   . Arthritis   . Depression   .  Hyperlipidemia   . Rheumatoid arthritis ([place]) 2019  . Temporal arteritis ([place])   . Thyroid disease    Allergies  Allergen Reactions  . Other     SEVERE ALLERGIC REACTIONS (NECK STIFFNESS/HIGH FEVER/ETC) Other reaction(s): Other (See Comments) SEVERE ALLERGIC REACTIONS (NECK STIFFNESS/HIGH FEVER/ETC)  . Tdap [Tetanus-Diphth-Acell Pertussis] Other (See Comments)    SEVERE ALLERGIC REACTIONS (NECK STIFFNESS/HIGH FEVER/ETC)  . Tetanus Toxoid, Adsorbed     Neck stiffness, and high fever Other reaction(s): Other (See Comments) Neck stiffness, and high fever    Social History   Socioeconomic History  . Marital status: Married    Spouse name: Not on file  . Number of children: Not on file  . Years of education: Not on file  . Highest education level: Not on file  Occupational History  . Not on file  Tobacco Use  . Smoking status: Former Smoker    Types: Cigarettes  . Smokeless tobacco: Never Used  . Tobacco comment: smoked infrequently when she was young; quit 79 yo   Substance and Sexual Activity  .  Alcohol use: No  . Drug use: No  . Sexual activity: Yes    Partners: Male    Birth control/protection: Surgical  Other Topics Concern  . Not on file  Social History Narrative  . Not on file   Social Determinants of Health   Financial Resource Strain:   . Difficulty of Paying Living Expenses:   Food Insecurity:   . Worried About Charity fundraiser in the Last Year:   . Arboriculturist in the Last Year:   Transportation Needs:   . Film/video editor (Medical):   Marland Kitchen Lack of Transportation (Non-Medical):   Physical Activity:   . Days of Exercise per Week:   . Minutes of Exercise per Session:   Stress:   . Feeling of Stress :   Social Connections:   . Frequency of Communication with Friends and Family:   . Frequency of Social Gatherings with Friends and Family:   . Attends Religious Services:   . Active Member of Clubs or Organizations:   . Attends Archivist Meetings:   Marland Kitchen Marital Status:     Vitals:   01/30/20 1413  BP: 100/62  Pulse: 63  Resp: 16  Temp: 97.9 F (36.6 C)  SpO2: 97%   Body mass index is 24.09 kg/m.  Physical Exam  Nursing note and vitals reviewed. Constitutional: She is oriented to person, place, and time. She appears well-developed and well-nourished. No distress.  HENT:  Head: Normocephalic and atraumatic.  Mouth/Throat: Oropharynx is clear and moist and mucous membranes are normal.  Eyes: Pupils are equal, round, and reactive to light. Conjunctivae are normal.  Cardiovascular: Normal rate and regular rhythm.  No murmur heard. Pulses:      Dorsalis pedis pulses are 2+ on the right side and 2+ on the left side.  Respiratory: Effort normal and breath sounds normal. No respiratory distress.  GI: Soft. She exhibits no mass. There is no hepatomegaly. There is no abdominal tenderness.  Musculoskeletal:        General: No edema.  Lymphadenopathy:    She has no cervical adenopathy.  Neurological: She is alert and oriented to person,  place, and time. She has normal strength. No cranial nerve deficit.  Otherwise stable gait, not assisted.  Skin: Skin is warm. No rash noted. No erythema.  Psychiatric: She has a normal mood and affect.  Well groomed, good eye  contact.    ASSESSMENT AND PLAN:  [name] was seen today for AWV and chronic disease management.  Orders Placed This Encounter  Procedures  . Lipid panel  . TSH  . LDL cholesterol, direct   Lab Results  Component Value Date   CHOL 160 01/30/2020   HDL 57.60 01/30/2020   LDLCALC 116 (H) 02/24/2019   LDLDIRECT 67.0 01/30/2020   TRIG 243.0 (H) 01/30/2020   CHOLHDL 3 01/30/2020   Lab Results  Component Value Date   TSH 1.88 01/30/2020    Medicare annual wellness visit, subsequent We discussed the importance of staying active, physically and mentally, as well as the benefits of a healthy/balnace diet. Low impact exercise that involve stretching and strengthing are ideal. Vaccines up-to-date. She is not interested in having more DEXA. She needs to repeat colonoscopy in 08/2022. Fall prevention.  Advance directives and end of life discussed, she has POA and living will.   Insomnia Problem is stable. No changes in current management. Good sleep hygiene to continue.  Generalized anxiety disorder Stable. Continue sertraline 150 mg daily and temazepam 15 mg daily. Follow-up in 5 to 6 months.  Hypothyroidism No changes in current management, will follow labs done today and will give further recommendations accordingly.   Hyperlipidemia Continue atorvastatin 10 mg daily and low-fat diet. Further recommendation will be given according to lipid panel results (not fasting).  RA (rheumatoid arthritis) (HCC) Still having exacerbations. Continue following with rheumatologist.  Peripheral neuropathy Problem is well controlled. Continue gabapentin 300 mg twice daily. Fall precautions discussed.  Major depressive disorder, recurrent  episode, moderate (HCC) Problem is adequately controlled with sertraline 150 mg daily. No changes in current management.  Return in about 6 months (around 07/31/2020) for 5-6 months..   Jennifer Scheurich G. Martin[place], MD  [facility]. [place] office.    Jennifer Hull , Thank you for taking time to come for your Medicare Wellness Visit. I appreciate your ongoing commitment to your health goals. Please review the following plan we discussed and let me know if I can assist you in the future.   These are the goals we discussed: Goals    . Patient Stated     Wants to feel better and be more mobile        This is a list of the screening recommended for you and due dates:  Health Maintenance  Topic Date Due  . COVID-19 Vaccine (1) Never done  . Flu Shot  05/09/2020  . DEXA scan (bone density measurement)  Completed  . Pneumonia vaccines  Completed   No cambios hoy. La veo otra vez en 5-6 meses.

## 2020-01-30 NOTE — Assessment & Plan Note (Signed)
Stable. Continue sertraline 150 mg daily and temazepam 15 mg daily. Follow-up in 5 to 6 months.

## 2020-01-30 NOTE — Assessment & Plan Note (Signed)
Problem is well controlled. Continue gabapentin 300 mg twice daily. Fall precautions discussed.

## 2020-01-31 ENCOUNTER — Encounter: Payer: Self-pay | Admitting: Family Medicine

## 2020-01-31 MED ORDER — ATORVASTATIN CALCIUM 20 MG PO TABS: 20.0000 mg | ORAL_TABLET | Freq: Every day | ORAL | 3 refills | Status: DC

## 2020-02-03 ENCOUNTER — Encounter: Payer: Self-pay | Admitting: Family Medicine

## 2020-02-03 DIAGNOSIS — M48061 Spinal stenosis, lumbar region without neurogenic claudication: Secondary | ICD-10-CM | POA: Diagnosis not present

## 2020-02-04 ENCOUNTER — Encounter: Payer: Self-pay | Admitting: Family Medicine

## 2020-02-05 DIAGNOSIS — M48061 Spinal stenosis, lumbar region without neurogenic claudication: Secondary | ICD-10-CM | POA: Diagnosis not present

## 2020-02-12 DIAGNOSIS — M0579 Rheumatoid arthritis with rheumatoid factor of multiple sites without organ or systems involvement: Secondary | ICD-10-CM | POA: Diagnosis not present

## 2020-02-13 ENCOUNTER — Other Ambulatory Visit: Payer: Self-pay | Admitting: Family Medicine

## 2020-02-13 DIAGNOSIS — F411 Generalized anxiety disorder: Secondary | ICD-10-CM

## 2020-02-13 DIAGNOSIS — F331 Major depressive disorder, recurrent, moderate: Secondary | ICD-10-CM

## 2020-02-20 ENCOUNTER — Encounter: Payer: Self-pay | Admitting: Gastroenterology

## 2020-03-25 ENCOUNTER — Ambulatory Visit (INDEPENDENT_AMBULATORY_CARE_PROVIDER_SITE_OTHER): Payer: Medicare Other | Admitting: Physician Assistant

## 2020-03-25 ENCOUNTER — Encounter: Payer: Self-pay | Admitting: Physician Assistant

## 2020-03-25 ENCOUNTER — Ambulatory Visit: Payer: Medicare Other | Admitting: Physician Assistant

## 2020-03-25 VITALS — BP 108/62 | HR 65 | Ht 63.0 in | Wt 133.0 lb

## 2020-03-25 DIAGNOSIS — K5900 Constipation, unspecified: Secondary | ICD-10-CM

## 2020-03-25 DIAGNOSIS — R140 Abdominal distension (gaseous): Secondary | ICD-10-CM | POA: Diagnosis not present

## 2020-03-25 DIAGNOSIS — K2100 Gastro-esophageal reflux disease with esophagitis, without bleeding: Secondary | ICD-10-CM

## 2020-03-25 MED ORDER — LINACLOTIDE 145 MCG PO CAPS: 145.0000 ug | ORAL_CAPSULE | Freq: Every day | ORAL | 3 refills | Status: DC

## 2020-03-25 NOTE — Patient Instructions (Addendum)
If you are age 78 or older, your body mass index should be between 23-30. Your Body mass index is 23.56 kg/m. If this is out of the aforementioned range listed, please consider follow up with your Primary Care Provider.  If you are age 16 or younger, your body mass index should be between 19-25. Your Body mass index is 23.56 kg/m. If this is out of the aformentioned range listed, please consider follow up with your Primary Care Provider.   We have sent the following medications to your pharmacy for you to pick up at your convenience:  Linzess 174mcg   We have given you samples of Linzess 145mg   Start taking your Pantoprazole 40mg  daily Use Simethicone with meals   Due to recent changes in healthcare laws, you may see the results of your imaging and laboratory studies on MyChart before your provider has had a chance to review them.  We understand that in some cases there may be results that are confusing or concerning to you. Not all laboratory results come back in the same time frame and the provider may be waiting for multiple results in order to interpret others.  Please give [place] 48 hours in order for your provider to thoroughly review all the results before contacting the office for clarification of your results.

## 2020-03-25 NOTE — Progress Notes (Signed)
Attending Physician's Attestation   I have reviewed the chart.   I agree with the Advanced Practitioner's note, impression, and recommendations with any updates as below.    Kayleana Waites Mansouraty, MD [facility] Office # [phone]  

## 2020-03-25 NOTE — Progress Notes (Signed)
Chief Complaint: Constipation, bloating, GERD  HPI:    Jennifer Hull is a 79 year old female with a past medical history as listed below, known to Dr. Rush Landmark, who presents to clinic today with a complaint of constipation, bloating and GERD.    10/01/2019 patient seen in clinic for follow-up of her constipation.  At that time noted a colonoscopy and EGD on 09/02/2019.  Colonoscopy with 8 polyps in the rectum, erythematous mucosa in the mid rectum and distal rectum and nonbleeding nonthrombosed external and internal hemorrhoids.  EGD with small hiatal hernia gastritis and no gross lesions in the duodenum.  PPI was increased to 40 twice daily and then decrease back down to once daily after 2 months.  Path was significant for gastritis and a mixture of tubular adenomas and sessile serrated polyps.  At that time patient was doing much better with her constipation with Linzess every other day or every third day and FiberCon.  Also using her pantoprazole 40 twice daily with plans to decrease.    Today, the patient presents to clinic and tells me that she started reading the side effects from Pantoprazole and stopped this medication altogether.  She tells me she has "enough going on a ready".  She has had to use this medicine occasionally about 3 or 4 times for reflux symptoms over the past month since stopping it.    Also discusses today that the [place] works for her constipation but she will occasionally get diarrhea, she is using this medication on a daily basis now and sometimes has uncontrollable stool.  Also complains of an increase in flatulence and bloating.  She picked up some Simethicone from the store and is taking 2 of these occasionally when she feels gas.    Patient has a social history positive for bringing me cookies at Christmas time, today she also complained of a rash which she is going to see her PCP about.    Denies fever, chills or blood in her stool.  Past Medical History:    Diagnosis Date  . Anxiety   . Arthritis   . Depression   . Hyperlipidemia   . Rheumatoid arthritis ([place]) 2019  . Temporal arteritis ([place])   . Thyroid disease     Past Surgical History:  Procedure Laterality Date  . ABDOMINAL HYSTERECTOMY    . BREAST EXCISIONAL BIOPSY Left   . BREAST SURGERY     biopsy  . COLONOSCOPY    . TEMPORAL ARTERY BIOPSY / LIGATION Bilateral 2014  . TONSILLECTOMY    . TOTAL SHOULDER ARTHROPLASTY Right 11/22/2017   Procedure: RIGHT REVERSE TOTAL SHOULDER ARTHROPLASTY;  Surgeon: Tania Ade, MD;  Location: [facility];  Service: Orthopedics;  Laterality: Right;    Current Outpatient Medications  Medication Sig Dispense Refill  . acetaminophen (TYLENOL) 500 MG tablet Take by mouth.    Marland Kitchen atorvastatin (LIPITOR) 20 MG tablet Take 1 tablet (20 mg total) by mouth daily. 90 tablet 3  . Calcium Carb-Cholecalciferol (CALCIUM 600+D3 PO) Take 1 tablet by mouth daily with lunch.    . cetirizine (ZYRTEC) 10 MG tablet Take 10 mg by mouth daily.    Arna Medici 112 MCG tablet TAKE 1 TABLET BY MOUTH ONCE DAILY BEFORE BREAKFAST 30 tablet 2  . folic acid (FOLVITE) 1 MG tablet Take 1 mg by mouth daily.     Marland Kitchen gabapentin (NEURONTIN) 300 MG capsule Take 1 capsule (300 mg total) by mouth 2 (two) times daily. 60 capsule 3  . hydroxychloroquine (PLAQUENIL)  200 MG tablet Take 1.5 tablets by mouth daily.    Marland Kitchen linaclotide (LINZESS) 290 MCG CAPS capsule Take by mouth.    . pantoprazole (PROTONIX) 40 MG tablet Take 1 tablet (40 mg total) by mouth 2 (two) times daily. (Patient taking differently: Take 40 mg by mouth daily. ) 90 tablet 0  . Probiotic Product (PROBIOTIC PO) Take 1 capsule by mouth daily.    . sertraline (ZOLOFT) 100 MG tablet TAKE 1 & 1/2 (ONE & ONE-HALF) TABLETS BY MOUTH ONCE DAILY 135 tablet 2  . temazepam (RESTORIL) 15 MG capsule TAKE 1 CAPSULE BY MOUTH AT BEDTIME AS NEEDED FOR SLEEP 30 capsule 3  . Tocilizumab (ACTEMRA IV) Inject into the vein every 30 (thirty) days.      No current facility-administered medications for this visit.    Allergies as of 03/25/2020 - Review Complete 01/30/2020  Allergen Reaction Noted  . Other  07/27/2016  . Tdap [tetanus-diphth-acell pertussis] Other (See Comments) 07/27/2016  . Tetanus toxoid, adsorbed  12/07/2017    Family History  Problem Relation Age of Onset  . Heart disease Mother   . Kidney cancer Mother   . Heart disease Father   . Breast cancer Maternal Aunt   . Colon cancer Neg Hx   . Esophageal cancer Neg Hx   . Rectal cancer Neg Hx   . Stomach cancer Neg Hx     Social History   Socioeconomic History  . Marital status: Married    Spouse name: Not on file  . Number of children: Not on file  . Years of education: Not on file  . Highest education level: Not on file  Occupational History  . Not on file  Tobacco Use  . Smoking status: Former Smoker    Types: Cigarettes  . Smokeless tobacco: Never Used  . Tobacco comment: smoked infrequently when she was young; quit 79 yo   Vaping Use  . Vaping Use: Never used  Substance and Sexual Activity  . Alcohol use: No  . Drug use: No  . Sexual activity: Yes    Partners: Male    Birth control/protection: Surgical  Other Topics Concern  . Not on file  Social History Narrative  . Not on file   Social Determinants of Health   Financial Resource Strain:   . Difficulty of Paying Living Expenses:   Food Insecurity:   . Worried About Charity fundraiser in the Last Year:   . Arboriculturist in the Last Year:   Transportation Needs:   . Film/video editor (Medical):   Marland Kitchen Lack of Transportation (Non-Medical):   Physical Activity:   . Days of Exercise per Week:   . Minutes of Exercise per Session:   Stress:   . Feeling of Stress :   Social Connections:   . Frequency of Communication with Friends and Family:   . Frequency of Social Gatherings with Friends and Family:   . Attends Religious Services:   . Active Member of Clubs or Organizations:    . Attends Archivist Meetings:   Marland Kitchen Marital Status:   Intimate Partner Violence:   . Fear of Current or Ex-Partner:   . Emotionally Abused:   Marland Kitchen Physically Abused:   . Sexually Abused:     Review of Systems:    Constitutional: No weight loss, fever or chills Cardiovascular: No chest pain Respiratory: No SOB  Gastrointestinal: See HPI and otherwise negative   Physical Exam:  Vital signs: BP  108/62   Pulse 65   Ht 5\' 3"  (1.6 m)   Wt 133 lb (60.3 kg)   SpO2 95%   BMI 23.56 kg/m   Constitutional:   Pleasant Elderly Hispanic female appears to be in NAD, Well developed, Well nourished, alert and cooperative Respiratory: Respirations even and unlabored. Lungs clear to auscultation bilaterally.   No wheezes, crackles, or rhonchi.  Cardiovascular: Normal S1, S2. No MRG. Regular rate and rhythm. No peripheral edema, cyanosis or pallor.  Gastrointestinal:  Soft, nondistended, nontender. No rebound or guarding. Normal bowel sounds. No appreciable masses or hepatomegaly. Psychiatric: Demonstrates good judgement and reason without abnormal affect or behaviors.  No recent labs/imaging.  Assessment: 1.  Constipation: Linzess 290 mcg daily is too strong for the patient with episodes of diarrhea, she is taking this medication correctly 30 minutes before eating 2.  GERD: Patient stopped her Pantoprazole due to her fear of side effects, did have some breakthrough symptoms, recent EGD with gastritis 3.  Gas and bloating: Likely related to the above +/- SIBO  Plan: 1.  Would recommend the patient restart her Pantoprazole 40 mg daily, 30 to 60 minutes before breakfast.  She already has this medication at home.  We discussed the side effects and I reassured her.  Do recommend she stays on this medication due to gastritis seen at time of EGD.  Also has a history of epigastric pain without this. 2.  Recommend the patient try taking her Simethicone 1 tab before meals, if this does not help with  gas and could try testing for SIBO/treatment. 3.  It sounds like the Linzess 290 mcg daily is too strong for the patient.  We will try 145 mcg instead, she was given samples today as well as a prescription. 4.  Patient to follow in clinic with me in 4 months or sooner if necessary.  Ellouise Newer, PA-C [facility] 03/25/2020, 11:17 AM  Cc: Martinique, Betty G, MD

## 2020-03-26 ENCOUNTER — Encounter: Payer: Self-pay | Admitting: Family Medicine

## 2020-03-26 ENCOUNTER — Other Ambulatory Visit: Payer: Self-pay

## 2020-03-26 ENCOUNTER — Telehealth: Payer: Self-pay | Admitting: Physician Assistant

## 2020-03-26 ENCOUNTER — Ambulatory Visit (INDEPENDENT_AMBULATORY_CARE_PROVIDER_SITE_OTHER): Payer: Medicare Other | Admitting: Family Medicine

## 2020-03-26 VITALS — BP 110/60 | HR 75 | Temp 97.7°F | Resp 12 | Ht 63.0 in | Wt 133.5 lb

## 2020-03-26 DIAGNOSIS — L298 Other pruritus: Secondary | ICD-10-CM

## 2020-03-26 MED ORDER — FLUCONAZOLE 150 MG PO TABS: 150.0000 mg | ORAL_TABLET | Freq: Once | ORAL | 0 refills | Status: AC

## 2020-03-26 MED ORDER — CLOTRIMAZOLE-BETAMETHASONE 1-0.05 % EX CREA: 1.0000 "application " | TOPICAL_CREAM | Freq: Two times a day (BID) | CUTANEOUS | 0 refills | Status: DC

## 2020-03-26 NOTE — Progress Notes (Signed)
Chief Complaint  Patient presents with   Rash    rash on abdomen that starteed several days ago   HPI: [name] is a 79 y.o. female with hx of anxiety,RA,and temporal arteritis here today with above complaint. A week of very pruritic rash on RLQ abdomen. Pruritus interferes with sleep.  Negative for new medication, detergent, soap, or body product. No known insect bite or outdoor exposures to plants. No sick contact. No Hx of eczema or similar rash in the past.  OTC medication for this problem: OTC Cortisone and took Hydroxyzine she had at home. Yesterday she applied Ketoconazole cream.  It seems better today. Negative for oral lesions/edema,cough, wheezing, dyspnea, abdominal pain, nausea, or vomiting.  She would like to take Diflucan, thinks is may be fungal.  Review of Systems  Constitutional: Positive for fatigue. Negative for activity change, appetite change, chills and fever.  HENT: Negative for facial swelling and sore throat.   Musculoskeletal: Negative for gait problem and myalgias.  Skin: Negative for wound.  Allergic/Immunologic: Positive for environmental allergies.  Neurological: Negative for weakness and headaches.  Psychiatric/Behavioral: Positive for sleep disturbance. The patient is nervous/anxious.   Rest see pertinent positives and negatives per HPI.  Current Outpatient Medications on File Prior to Visit  Medication Sig Dispense Refill   acetaminophen (TYLENOL) 500 MG tablet Take by mouth.     atorvastatin (LIPITOR) 20 MG tablet Take 1 tablet (20 mg total) by mouth daily. 90 tablet 3   Calcium Carb-Cholecalciferol (CALCIUM 600+D3 PO) Take 1 tablet by mouth daily with lunch.     cetirizine (ZYRTEC) 10 MG tablet Take 10 mg by mouth daily.     EUTHYROX 112 MCG tablet TAKE 1 TABLET BY MOUTH ONCE DAILY BEFORE BREAKFAST 30 tablet 2   folic acid (FOLVITE) 1 MG tablet Take 1 mg by mouth daily.      gabapentin (NEURONTIN) 300 MG capsule Take  1 capsule (300 mg total) by mouth 2 (two) times daily. 60 capsule 3   hydroxychloroquine (PLAQUENIL) 200 MG tablet Take 1.5 tablets by mouth daily.     linaclotide (LINZESS) 145 MCG CAPS capsule Take 1 capsule (145 mcg total) by mouth daily before breakfast. 90 capsule 3   Melatonin 10 MG CAPS Take by mouth at bedtime.     pantoprazole (PROTONIX) 40 MG tablet Take 1 tablet (40 mg total) by mouth 2 (two) times daily. (Patient taking differently: Take 40 mg by mouth daily as needed. ) 90 tablet 0   Probiotic Product (PROBIOTIC PO) Take 1 capsule by mouth daily.     sertraline (ZOLOFT) 100 MG tablet TAKE 1 & 1/2 (ONE & ONE-HALF) TABLETS BY MOUTH ONCE DAILY 135 tablet 2   temazepam (RESTORIL) 15 MG capsule TAKE 1 CAPSULE BY MOUTH AT BEDTIME AS NEEDED FOR SLEEP 30 capsule 3   Tocilizumab (ACTEMRA IV) Inject into the vein every 30 (thirty) days.     No current facility-administered medications on file prior to visit.     Past Medical History:  Diagnosis Date   Anxiety    Arthritis    Depression    Hyperlipidemia    Rheumatoid arthritis ([place]) 2019   Temporal arteritis (HCC)    Thyroid disease    Allergies  Allergen Reactions   Other     SEVERE ALLERGIC REACTIONS (NECK STIFFNESS/HIGH FEVER/ETC) Other reaction(s): Other (See Comments) SEVERE ALLERGIC REACTIONS (NECK STIFFNESS/HIGH FEVER/ETC)   Tdap [Tetanus-Diphth-Acell Pertussis] Other (See Comments)    SEVERE ALLERGIC REACTIONS (  NECK STIFFNESS/HIGH FEVER/ETC)   Tetanus Toxoid, Adsorbed     Neck stiffness, and high fever Other reaction(s): Other (See Comments) Neck stiffness, and high fever    Social History   Socioeconomic History   Marital status: Married    Spouse name: Not on file   Number of children: Not on file   Years of education: Not on file   Highest education level: Not on file  Occupational History   Not on file  Tobacco Use   Smoking status: Former Smoker    Types: Cigarettes    Smokeless tobacco: Never Used   Tobacco comment: smoked infrequently when she was young; quit 79 yo   Vaping Use   Vaping Use: Never used  Substance and Sexual Activity   Alcohol use: No   Drug use: No   Sexual activity: Yes    Partners: Male    Birth control/protection: Surgical  Other Topics Concern   Not on file  Social History Narrative   Not on file   Social Determinants of Health   Financial Resource Strain:    Difficulty of Paying Living Expenses:   Food Insecurity:    Worried About Charity fundraiser in the Last Year:    Arboriculturist in the Last Year:   Transportation Needs:    Film/video editor (Medical):    Lack of Transportation (Non-Medical):   Physical Activity:    Days of Exercise per Week:    Minutes of Exercise per Session:   Stress:    Feeling of Stress :   Social Connections:    Frequency of Communication with Friends and Family:    Frequency of Social Gatherings with Friends and Family:    Attends Religious Services:    Active Member of Clubs or Organizations:    Attends Archivist Meetings:    Marital Status:     Vitals:   03/26/20 0709  BP: 110/60  Pulse: 75  Resp: 12  Temp: 97.7 F (36.5 C)  SpO2: 98%   Body mass index is 23.65 kg/m.  Physical Exam  Nursing note and vitals reviewed. Constitutional: She is oriented to person, place, and time. She appears well-developed. No distress.  HENT:  Head: Normocephalic and atraumatic.  Mouth/Throat: Mucous membranes are moist.  Eyes: Conjunctivae are normal.  Respiratory: Effort normal and breath sounds normal. No respiratory distress.  GI: Normal appearance.  Lymphadenopathy:    She has no cervical adenopathy.  Neurological: She is alert and oriented to person, place, and time.  Stable gait, not assisted.  Skin: Skin is warm. Rash noted.     Mildly erythematous macular lesion, RLQ. See picture.  Psychiatric: Her speech is normal. Affect normal.  Her mood appears anxious.  Well groomed, good eye contact.      ASSESSMENT AND PLAN:  Jennifer Hull was seen today for rash.  Diagnoses and all orders for this visit:  Pruritic erythematous rash -     fluconazole (DIFLUCAN) 150 MG tablet; Take 1 tablet (150 mg total) by mouth once for 1 dose. -     clotrimazole-betamethasone (LOTRISONE) cream; Apply 1 application topically 2 (two) times daily.  Reported a a new problem. We discussed possible etiologies, it does not seem to be fungal. ? Contact dermatitis, eczema. Diflucan sent as requested, she could hold on taking it until trying topical therapy with Lotrisone bid x 14 days at the time. If problem does not resolve or is persistent we may need to consider derma  evaluation.   Return if symptoms worsen or fail to improve.   Gotham Raden G. Martinique, MD  [facility]. [place] office.  Discharge Instructions   None    A few things to remember from today's visit:   Pruritic erythematous rash - Plan: fluconazole (DIFLUCAN) 150 MG tablet, clotrimazole-betamethasone (LOTRISONE) cream  Estamos cubriendo por hongos y Scientist, research (physical sciences). Si no mejora tendremos que Freight forwarder.  If you need refills please call your pharmacy. Do not use My Chart to request refills or for acute issues that need immediate attention.    Please be sure medication list is accurate. If a new problem present, please set up appointment sooner than planned today

## 2020-03-26 NOTE — Telephone Encounter (Signed)
Patient is asking to resend script [facility] states they did not receive it

## 2020-03-26 NOTE — Patient Instructions (Signed)
A few things to remember from today's visit:   Pruritic erythematous rash - Plan: fluconazole (DIFLUCAN) 150 MG tablet, clotrimazole-betamethasone (LOTRISONE) cream  Estamos cubriendo por hongos y Scientist, research (physical sciences). Si no mejora tendremos que Freight forwarder.  If you need refills please call your pharmacy. Do not use My Chart to request refills or for acute issues that need immediate attention.    Please be sure medication list is accurate. If a new problem present, please set up appointment sooner than planned today.

## 2020-03-29 MED ORDER — LINACLOTIDE 145 MCG PO CAPS: 145.0000 ug | ORAL_CAPSULE | Freq: Every day | ORAL | 3 refills | Status: DC

## 2020-03-29 NOTE — Telephone Encounter (Signed)
faxwd new rx to [facility]: [phone]

## 2020-03-29 NOTE — Telephone Encounter (Signed)
Tried to return the patients call regarding medication. Left a message on her voicemail

## 2020-03-29 NOTE — Telephone Encounter (Signed)
Patient is returning your call.  

## 2020-03-29 NOTE — Addendum Note (Signed)
Addended by: Lanny Hurst A on: 03/29/2020 01:53 PM   Modules accepted: Orders

## 2020-03-29 NOTE — Telephone Encounter (Signed)
Contacted Jennifer Hull and she needs the Linzess sent to [facility] instead of [facility]. She has been approved for 1  Year of the 290 mcg and her rx has been cut to 145mg , will refil today and I have placed 6 sample bottles at the front for her. The patient verbalized understanding.

## 2020-04-05 ENCOUNTER — Telehealth: Payer: Self-pay | Admitting: Family Medicine

## 2020-04-05 NOTE — Progress Notes (Signed)
  Chronic Care Management   Outreach Note  04/05/2020 Name: ELEXIS POLLAK MRN: [id number] DOB: [birth year]  Referred by: Martinique, Betty G, MD Reason for referral : No chief complaint on file.   An unsuccessful telephone outreach was attempted today. The patient was referred to the pharmacist for assistance with care management and care coordination.   Follow Up Plan:  [place]

## 2020-04-06 ENCOUNTER — Other Ambulatory Visit: Payer: Self-pay | Admitting: Family Medicine

## 2020-04-06 DIAGNOSIS — E039 Hypothyroidism, unspecified: Secondary | ICD-10-CM

## 2020-04-07 ENCOUNTER — Encounter: Payer: Self-pay | Admitting: Family Medicine

## 2020-04-16 ENCOUNTER — Telehealth: Payer: Self-pay | Admitting: Family Medicine

## 2020-04-16 NOTE — Progress Notes (Signed)
  Chronic Care Management   Note  04/16/2020 Name: VERLA BRYNGELSON MRN: [id number] DOB: [birth year]  Gerhard Perches Mineer is a 79 y.o. year old female who is a primary care patient of Martinique, Malka So, MD. I reached out to [place] by phone today in response to a referral sent by Ms. Gerhard Perches Ragan's PCP, Martinique, Betty G, MD.   Ms. Pettibone was given information about Chronic Care Management services today including:  1. CCM service includes personalized support from designated clinical staff supervised by her physician, including individualized plan of care and coordination with other care providers 2. 24/7 contact phone numbers for assistance for urgent and routine care needs. 3. Service will only be billed when office clinical staff spend 20 minutes or more in a month to coordinate care. 4. Only one practitioner may furnish and bill the service in a calendar month. 5. The patient may stop CCM services at any time (effective at the end of the month) by phone call to the office staff.   Patient agreed to services and verbal consent obtained.   Follow up plan:   [place]

## 2020-04-20 ENCOUNTER — Telehealth: Payer: Self-pay | Admitting: Physician Assistant

## 2020-04-20 NOTE — Telephone Encounter (Signed)
Contacted Jennifer Hull regarding her Linzess 140mcg. exlplained to her that there should be refills left on the prescription for her. She stated the reason she called was because she was told by [facility] that the medication was sent. Checked here and they did send it but the sent Linzess 290 which she is no longer on. I have left 4 sample bottles at the front desk for the the patient Linzess 180mcg 4 capsules, 4 bottles, she is contacted [facility] and will call if we need to do anything else.

## 2020-04-20 NOTE — Telephone Encounter (Signed)
Pt is requesting a 1 month supply for her Linzess. She wants this through [facility].

## 2020-04-29 ENCOUNTER — Telehealth: Payer: Self-pay | Admitting: General Surgery

## 2020-04-29 NOTE — Telephone Encounter (Signed)
Received 3 bottles  With 30 tablets of 16mcg Linzess. Received from patient assist program with [facility]. The patient will be in tomorrow to pick up the medication. Place at front for pick up.

## 2020-05-03 DIAGNOSIS — M5136 Other intervertebral disc degeneration, lumbar region: Secondary | ICD-10-CM | POA: Diagnosis not present

## 2020-05-03 DIAGNOSIS — M8589 Other specified disorders of bone density and structure, multiple sites: Secondary | ICD-10-CM | POA: Diagnosis not present

## 2020-05-03 DIAGNOSIS — M25511 Pain in right shoulder: Secondary | ICD-10-CM | POA: Diagnosis not present

## 2020-05-03 DIAGNOSIS — G8929 Other chronic pain: Secondary | ICD-10-CM | POA: Diagnosis not present

## 2020-05-03 DIAGNOSIS — M25561 Pain in right knee: Secondary | ICD-10-CM | POA: Diagnosis not present

## 2020-05-03 DIAGNOSIS — Z7952 Long term (current) use of systemic steroids: Secondary | ICD-10-CM | POA: Diagnosis not present

## 2020-05-03 DIAGNOSIS — M0589 Other rheumatoid arthritis with rheumatoid factor of multiple sites: Secondary | ICD-10-CM | POA: Diagnosis not present

## 2020-05-03 DIAGNOSIS — M2550 Pain in unspecified joint: Secondary | ICD-10-CM | POA: Diagnosis not present

## 2020-05-03 DIAGNOSIS — R5383 Other fatigue: Secondary | ICD-10-CM | POA: Diagnosis not present

## 2020-05-03 DIAGNOSIS — M316 Other giant cell arteritis: Secondary | ICD-10-CM | POA: Diagnosis not present

## 2020-05-03 DIAGNOSIS — Z6823 Body mass index (BMI) 23.0-23.9, adult: Secondary | ICD-10-CM | POA: Diagnosis not present

## 2020-05-04 ENCOUNTER — Other Ambulatory Visit: Payer: Self-pay | Admitting: Family Medicine

## 2020-05-04 DIAGNOSIS — Z1231 Encounter for screening mammogram for malignant neoplasm of breast: Secondary | ICD-10-CM

## 2020-05-06 DIAGNOSIS — H04123 Dry eye syndrome of bilateral lacrimal glands: Secondary | ICD-10-CM | POA: Diagnosis not present

## 2020-05-06 DIAGNOSIS — H1045 Other chronic allergic conjunctivitis: Secondary | ICD-10-CM | POA: Diagnosis not present

## 2020-05-06 DIAGNOSIS — Z961 Presence of intraocular lens: Secondary | ICD-10-CM | POA: Diagnosis not present

## 2020-05-08 ENCOUNTER — Other Ambulatory Visit: Payer: Self-pay | Admitting: Family Medicine

## 2020-05-08 DIAGNOSIS — E039 Hypothyroidism, unspecified: Secondary | ICD-10-CM

## 2020-05-13 ENCOUNTER — Other Ambulatory Visit: Payer: Self-pay | Admitting: Family Medicine

## 2020-05-13 DIAGNOSIS — L298 Other pruritus: Secondary | ICD-10-CM

## 2020-05-17 DIAGNOSIS — M0589 Other rheumatoid arthritis with rheumatoid factor of multiple sites: Secondary | ICD-10-CM | POA: Diagnosis not present

## 2020-05-17 DIAGNOSIS — Z79899 Other long term (current) drug therapy: Secondary | ICD-10-CM | POA: Diagnosis not present

## 2020-05-17 DIAGNOSIS — Z13220 Encounter for screening for lipoid disorders: Secondary | ICD-10-CM | POA: Diagnosis not present

## 2020-05-17 DIAGNOSIS — M0579 Rheumatoid arthritis with rheumatoid factor of multiple sites without organ or systems involvement: Secondary | ICD-10-CM | POA: Diagnosis not present

## 2020-05-31 ENCOUNTER — Ambulatory Visit: Payer: Medicare Other | Admitting: Family Medicine

## 2020-06-02 ENCOUNTER — Ambulatory Visit (INDEPENDENT_AMBULATORY_CARE_PROVIDER_SITE_OTHER): Payer: Medicare Other | Admitting: Family Medicine

## 2020-06-02 ENCOUNTER — Encounter: Payer: Self-pay | Admitting: Family Medicine

## 2020-06-02 ENCOUNTER — Other Ambulatory Visit: Payer: Self-pay

## 2020-06-02 VITALS — BP 118/62 | HR 68 | Temp 98.4°F | Wt 136.0 lb

## 2020-06-02 DIAGNOSIS — L270 Generalized skin eruption due to drugs and medicaments taken internally: Secondary | ICD-10-CM

## 2020-06-02 MED ORDER — CETIRIZINE HCL 10 MG PO TABS: 10.0000 mg | ORAL_TABLET | Freq: Every day | ORAL | 0 refills | Status: DC

## 2020-06-02 MED ORDER — HYDROXYZINE HCL 25 MG PO TABS: 25.0000 mg | ORAL_TABLET | Freq: Four times a day (QID) | ORAL | 0 refills | Status: DC | PRN

## 2020-06-02 MED ORDER — PREDNISONE 10 MG PO TABS: ORAL_TABLET | ORAL | 0 refills | Status: DC

## 2020-06-02 MED ORDER — PANTOPRAZOLE SODIUM 40 MG PO TBEC: 40.0000 mg | DELAYED_RELEASE_TABLET | Freq: Every day | ORAL | 0 refills | Status: DC

## 2020-06-02 NOTE — Patient Instructions (Signed)
Drug Rash  A drug rash occurs when a medicine causes a change in the color or texture of the skin. It can develop minutes, hours, or days after you take the medicine. The rash may appear on a small area of skin or all over your body. What are the causes? This condition is usually caused by your body's reaction (allergy) to a medicine. It can also be caused by exposure to sunlight after taking a medicine that makes your skin sensitive to light. Though any medicine can cause a rash or reaction, medicines that are more likely to cause rashes include:  Penicillin.  Antibiotic medicines.  Medicines that treat seizures.  Medicines that treat cancer (chemotherapy).  Aspirin and other NSAIDs.  Injectable dyes that contain iodine.  Insulin. What are the signs or symptoms? Symptoms of this condition include:  Redness.  Tiny bumps.  Peeling.  Itching.  Itchy welts (hives).  Swelling. How is this diagnosed? This condition may be diagnosed based on:  A physical exam.  Tests to find out which medicine caused the rash. These tests may include: ? Skin tests. ? Blood tests. ? Challenge test. For this test, you stop taking all the medicines that you do not need to take. Then, you start taking them again by adding back one medicine at a time. How is this treated? This condition is treated with medicines, including:  Antihistamine. This may be given to relieve itching.  NSAIDs. These may be given to reduce swelling and to treat pain.  A steroid medicine. This may be given to reduce swelling. The rash usually goes away when you stop taking the medicine that caused it. Follow these instructions at home:  Take over-the-counter and prescription medicines only as told by your health care provider.  Tell all your health care providers about any medicine reactions that you have had in the past.  If your rash was caused by sensitivity to sunlight, and while your rash is  healing: ? Avoid being in the sun if possible, especially when it is strongest, usually between 10 a.m. and 4 p.m. ? Cover your skin with pants, long sleeves, and a hat when you are exposed to sunlight.  If you have hives: ? Take a cool shower or use a cool compress to relieve itchiness. ? Take over-the-counter antihistamines, as recommended by your health care provider, until the hives are gone. Hives are not contagious.  Keep all follow-up visits as told by your health care provider. This is important. Contact a health care provider if you have:  A fever.  A rash that is not going away.  A rash that gets worse.  A rash that comes back.  Wheezing or coughing. Get help right away if:  You start to have breathing problems.  You start to have shortness of breath.  Your face or throat starts to swell.  You have severe weakness with dizziness or fainting.  You have chest pain. These symptoms may represent a serious problem that is an emergency. Do not wait to see if the symptoms will go away. Get medical help right away. Call your local emergency services (911 in the [place] Do not drive yourself to the hospital. Summary  A drug rash occurs when a medicine causes a change in the color or texture of the skin. The rash may appear on a small area of skin or all over your body.  It can develop minutes, hours, or days after you take the medicine.  Your health care   provider will do various tests to determine what medicine caused your rash.  The rash may be treated with medicine to relieve itching, swelling, and pain. This information is not intended to replace advice given to you by your health care provider. Make sure you discuss any questions you have with your health care provider. Document Revised: 09/07/2017 Document Reviewed: 08/16/2017 Elsevier Patient Education  2020 Elsevier Inc.  

## 2020-06-02 NOTE — Progress Notes (Signed)
Subjective:    Patient ID: Jennifer Hull, female    DOB: 09/08/1941, 79 y.o.   MRN: [id number]  No chief complaint on file.   HPI Pt is a 79 yo female with pmh sig for h/o temporal arteritis, GERD, hypothyroidism, peripheral neuropathy, osteoporosis, HLD, RA, MDD, insomnia, GAD followed by Dr. Martinique who was seen for acute concern.  Patient endorses pruritus and rash over entire body which started last night.  Patient tried Benadryl and leftover Atarax for symptoms.  Atarax helped until it wore off.  New meds include turmeric capsules daily which patient started 3 weeks ago.  Pt had an Actemra IV 1 week ago.  Pt takes Zyrtec and Protonix regularly.  Past Medical History:  Diagnosis Date  . Anxiety   . Arthritis   . Depression   . Hyperlipidemia   . Rheumatoid arthritis ([place]) 2019  . Temporal arteritis ([place])   . Thyroid disease     Allergies  Allergen Reactions  . Other     SEVERE ALLERGIC REACTIONS (NECK STIFFNESS/HIGH FEVER/ETC) Other reaction(s): Other (See Comments) SEVERE ALLERGIC REACTIONS (NECK STIFFNESS/HIGH FEVER/ETC)  . Tdap [Tetanus-Diphth-Acell Pertussis] Other (See Comments)    SEVERE ALLERGIC REACTIONS (NECK STIFFNESS/HIGH FEVER/ETC)  . Tetanus Toxoid, Adsorbed     Neck stiffness, and high fever Other reaction(s): Other (See Comments) Neck stiffness, and high fever    ROS General: Denies fever, chills, night sweats, changes in weight, changes in appetite HEENT: Denies headaches, ear pain, changes in vision, rhinorrhea, sore throat CV: Denies CP, palpitations, SOB, orthopnea Pulm: Denies SOB, cough, wheezing GI: Denies abdominal pain, nausea, vomiting, diarrhea, constipation GU: Denies dysuria, hematuria, frequency, vaginal discharge Msk: Denies muscle cramps, joint pains Neuro: Denies weakness, numbness, tingling Skin: Denies bruising  +pruritic rash Psych: Denies depression, anxiety, hallucinations   Objective:    Blood pressure 118/62, pulse 68,  temperature 98.4 F (36.9 C), temperature source Oral, weight 136 lb (61.7 kg), SpO2 98 %.  Gen. Pleasant, well-nourished, in no distress, normal affect   HEENT: [place]/AT, face symmetric, conjunctiva clear, no scleral icterus, PERRLA, EOMI, nares patent without drainage Lungs: no accessory muscle use Cardiovascular: RRR, no peripheral edema Neuro:  A&Ox3, CN II-XII intact, normal gait Skin:  Warm, dry, intact.  Diffuse, pruritic, erythematous, macules and papules on UEs, torso, back.  Less diffuse on LEs (in popliteal area, thighs, and shins)    Wt Readings from Last 3 Encounters:  06/02/20 136 lb (61.7 kg)  03/26/20 133 lb 8 oz (60.6 kg)  03/25/20 133 lb (60.3 kg)    Lab Results  Component Value Date   WBC 12.3 (H) 11/23/2017   HGB 11.4 (L) 11/23/2017   HCT 34.4 (L) 11/23/2017   PLT 199 11/23/2017   GLUCOSE 89 05/31/2018   CHOL 160 01/30/2020   TRIG 243.0 (H) 01/30/2020   HDL 57.60 01/30/2020   LDLDIRECT 67.0 01/30/2020   LDLCALC 116 (H) 02/24/2019   ALT 21 11/15/2017   AST 23 11/15/2017   NA 138 05/31/2018   K 4.0 05/31/2018   CL 102 05/31/2018   CREATININE 0.71 05/31/2018   BUN 19 05/31/2018   CO2 28 05/31/2018   TSH 1.88 01/30/2020   INR 0.98 11/15/2017   HGBA1C 5.1 07/21/2019    Assessment/Plan:  Drug rash  -morbiliform rash more impressive in person than in above picture. -likely 2/2 turmeric pills vs Actemra however pt has been on Actemra for a while. -discussed d/c turmeric pills -supportive care for pruritis.  Pt encouraged to  avoid scratching to prevent possible secondary infection. -initially sent rx for zyrtec and protonix to pharmacy, however pt has these meds at home.  rxs d/c'd -given handout -given precautions - Plan: predniSONE (DELTASONE) 10 MG tablet, hydrOXYzine (ATARAX/VISTARIL) 25 MG tablet, DISCONTINUED: cetirizine (ZYRTEC) 10 MG tablet, DISCONTINUED: pantoprazole (PROTONIX) 40 MG tablet  F/u prn  Grier Mitts, MD

## 2020-06-15 ENCOUNTER — Telehealth: Payer: Self-pay | Admitting: Physician Assistant

## 2020-06-16 NOTE — Telephone Encounter (Signed)
Notified the patient that we have not received any Linzess at this time. Patient will call [facility] and check on status.

## 2020-06-17 ENCOUNTER — Telehealth: Payer: Medicare Other

## 2020-06-18 ENCOUNTER — Other Ambulatory Visit: Payer: Self-pay

## 2020-06-18 ENCOUNTER — Ambulatory Visit
Admission: RE | Admit: 2020-06-18 | Discharge: 2020-06-18 | Disposition: A | Discharge status: Home / Self Care | Payer: Medicare Other | Source: Ambulatory Visit | Attending: Family Medicine | Admitting: Family Medicine

## 2020-06-18 DIAGNOSIS — Z1231 Encounter for screening mammogram for malignant neoplasm of breast: Secondary | ICD-10-CM | POA: Diagnosis not present

## 2020-07-07 DIAGNOSIS — M0579 Rheumatoid arthritis with rheumatoid factor of multiple sites without organ or systems involvement: Secondary | ICD-10-CM | POA: Diagnosis not present

## 2020-07-09 NOTE — Telephone Encounter (Signed)
Patient is calling to follow up on previous message states she is about to run out of what she has please advise

## 2020-07-12 NOTE — Telephone Encounter (Signed)
Contacted the patient and she stated she spoke with [facility] on Friday and they stated the medication would be here on Monday, 07/12/2020. I will call the patient when it arrives.

## 2020-07-13 NOTE — Telephone Encounter (Signed)
Notified the patient her Linzess came in today 182mcg 3 bottles with 30tablets each. The patient verbalized understanding and will be in today to pick them up.

## 2020-07-23 ENCOUNTER — Ambulatory Visit (INDEPENDENT_AMBULATORY_CARE_PROVIDER_SITE_OTHER): Payer: Medicare Other | Admitting: Family Medicine

## 2020-07-23 ENCOUNTER — Other Ambulatory Visit: Payer: Self-pay

## 2020-07-23 ENCOUNTER — Encounter: Payer: Self-pay | Admitting: Family Medicine

## 2020-07-23 VITALS — BP 120/70 | HR 70 | Resp 16 | Ht 63.0 in | Wt 136.0 lb

## 2020-07-23 DIAGNOSIS — F411 Generalized anxiety disorder: Secondary | ICD-10-CM

## 2020-07-23 DIAGNOSIS — K219 Gastro-esophageal reflux disease without esophagitis: Secondary | ICD-10-CM

## 2020-07-23 DIAGNOSIS — G629 Polyneuropathy, unspecified: Secondary | ICD-10-CM | POA: Diagnosis not present

## 2020-07-23 DIAGNOSIS — R053 Chronic cough: Secondary | ICD-10-CM

## 2020-07-23 DIAGNOSIS — L299 Pruritus, unspecified: Secondary | ICD-10-CM | POA: Diagnosis not present

## 2020-07-23 DIAGNOSIS — I700 Atherosclerosis of aorta: Secondary | ICD-10-CM | POA: Insufficient documentation

## 2020-07-23 DIAGNOSIS — G4700 Insomnia, unspecified: Secondary | ICD-10-CM

## 2020-07-23 DIAGNOSIS — F331 Major depressive disorder, recurrent, moderate: Secondary | ICD-10-CM | POA: Diagnosis not present

## 2020-07-23 MED ORDER — HYDROXYZINE HCL 25 MG PO TABS: 25.0000 mg | ORAL_TABLET | Freq: Every day | ORAL | 2 refills | Status: DC | PRN

## 2020-07-23 NOTE — Patient Instructions (Addendum)
A few things to remember from today's visit:  Generalized anxiety disorder  Major depressive disorder, recurrent episode, moderate (HCC)  Insomnia, unspecified type  La lengua parece normal hoy. Continue Hydroxyzine si lo necesita. Stop Cetirizine. If you want derma evaluation, we can arrange. Chest CT will be arrange to evaluate cough.  If heartburn comes back we will need to resume Pantoprazole, we could try 20 mg instead 40 mg.  If you need refills please call your pharmacy. Do not use My Chart to request refills or for acute issues that need immediate attention.    Please be sure medication list is accurate. If a new problem present, please set up appointment sooner than planned today.

## 2020-07-23 NOTE — Assessment & Plan Note (Signed)
Improved. Continue Melatonin 10 mg before bedtime. Temazepam to continue as needed. Good sleep hygiene.

## 2020-07-23 NOTE — Assessment & Plan Note (Signed)
Gabapentin is helping, so continue 300 mg bid. Fall precautions. Good foot care.

## 2020-07-23 NOTE — Assessment & Plan Note (Addendum)
Could be contributing to cough. I do not appreciate tongue abnormalities.  Continue Protonix 40 mg if needed. We could consider decreasing dose of Protonix from 40 mg to 20 mg if she needs to be back to daily med. GERD precautions to continue.

## 2020-07-23 NOTE — Assessment & Plan Note (Signed)
Seen on CXR. Continue Atorvastatin 20 mg daily.

## 2020-07-23 NOTE — Assessment & Plan Note (Addendum)
We discussed possible etiologies. ? Allergies, GERD. Chest CT will be arranged.

## 2020-07-23 NOTE — Assessment & Plan Note (Signed)
Well controlled. Continue Sertraline 150 mg daily.

## 2020-07-23 NOTE — Progress Notes (Signed)
Chief Complaint  Patient presents with  . white coating on tongue   HPI: [name] is a 79 y.o. female with hx of anxiety,depression, GERD, and RA here today with above complaint.  Noted whitish tongue yesterday. Today it looks better. Negative for bleeding,burning sensation,or pain. She has not noted associated fever,chills,changes in appetite,sore throat, oral lesions,or dysphagia. No OTC treatment. She thinks it may be related to one of her meds, not sure which one.  Generalized skin pruritus, mainly on back and UE's. This is intermittent,mild, and may not happen in weeks. OTC topical treatment with calamine helps some. It interferes with sleep. Hydroxyzine helps. Problem started after COVID 19 vaccine, she had erythematous skin rash that resolved, residual pruritus.  Her rheumatologist stopped Plaquenil, thinking it could be a contributing factor but did not help.  Chronic cough. "Tickle" sensation in throat. No productive cough. She has not identified exacerbating or alleviating factors.  It happens during the day. No associated wheezing or SOB. Negative for orthopnea, PND,or CP.  + Allergies. She takes Cetirizine 10 mg daily Former smoker. CXR 02/2019: Stable diffuse interstitial thickening, likely reflecting chronic inflammatory type change. There is no appreciable edema or consolidation. Cardiac silhouette within normal limits. No evident adenopathy.  Aortic Atherosclerosis (ICD10-I70.0).  Hx of GERD: On Pantoprazole 40 mg. Initially she was on Pantoprazole 40 mg bid, 4 weeks.   She has not taken PPI in a few days, afraid of side effects. No changes in cough with changes in PPI's dose.  Insomnia, anxiety,and depression:She has not takenTemazepam for the past 2 weeks, she has been sleeping better with Melatonin 10 mg. Sleeping about 7-8 hours. Sertraline 150 mg daily. Medication is still helping.  Peripheral neuropathy: She is on Gabapentin 300 mg  bid. Medication has helped with :E burning sensation.  Review of Systems  Constitutional: Positive for fatigue. Negative for activity change and appetite change.  HENT: Negative for mouth sores, nosebleeds and sore throat.   Eyes: Negative for redness and visual disturbance.  Cardiovascular: Negative for palpitations and leg swelling.  Gastrointestinal: Negative for abdominal pain, nausea and vomiting.       Negative for changes in bowel habits.  Genitourinary: Negative for dysuria and hematuria.  Musculoskeletal: Positive for arthralgias and gait problem.  Allergic/Immunologic: Positive for environmental allergies.  Neurological: Negative for syncope, weakness and headaches.  Psychiatric/Behavioral: Negative for confusion. The patient is nervous/anxious.   Rest see pertinent positives and negatives per HPI.  Current Outpatient Medications on File Prior to Visit  Medication Sig Dispense Refill  . acetaminophen (TYLENOL) 500 MG tablet Take by mouth.    Marland Kitchen atorvastatin (LIPITOR) 20 MG tablet Take 1 tablet (20 mg total) by mouth daily. 90 tablet 3  . Calcium Carb-Cholecalciferol (CALCIUM 600+D3 PO) Take 1 tablet by mouth daily with lunch.    . cetirizine (ZYRTEC) 10 MG tablet Take 10 mg by mouth daily.    . clotrimazole-betamethasone (LOTRISONE) cream APPLY 1 CREAM TOPICALLY TWICE DAILY 30 g 0  . EUTHYROX 112 MCG tablet TAKE 1 TABLET BY MOUTH ONCE DAILY BEFORE BREAKFAST 90 tablet 1  . folic acid (FOLVITE) 1 MG tablet Take 1 mg by mouth daily.     Marland Kitchen gabapentin (NEURONTIN) 300 MG capsule Take 1 capsule (300 mg total) by mouth 2 (two) times daily. 60 capsule 3  . linaclotide (LINZESS) 145 MCG CAPS capsule Take 1 capsule (145 mcg total) by mouth daily before breakfast. 90 capsule 3  . Melatonin 10 MG CAPS  Take by mouth at bedtime.    . pantoprazole (PROTONIX) 40 MG tablet Take 1 tablet (40 mg total) by mouth 2 (two) times daily. (Patient taking differently: Take 40 mg by mouth daily as needed. )  90 tablet 0  . Probiotic Product (PROBIOTIC PO) Take 1 capsule by mouth daily.    . sertraline (ZOLOFT) 100 MG tablet TAKE 1 & 1/2 (ONE & ONE-HALF) TABLETS BY MOUTH ONCE DAILY 135 tablet 2  . temazepam (RESTORIL) 15 MG capsule TAKE 1 CAPSULE BY MOUTH AT BEDTIME AS NEEDED FOR SLEEP 30 capsule 3  . Tocilizumab (ACTEMRA IV) Inject into the vein every 30 (thirty) days.     No current facility-administered medications on file prior to visit.   Past Medical History:  Diagnosis Date  . Anxiety   . Arthritis   . Depression   . Hyperlipidemia   . Rheumatoid arthritis ([place]) 2019  . Temporal arteritis ([place])   . Thyroid disease    Allergies  Allergen Reactions  . Other     SEVERE ALLERGIC REACTIONS (NECK STIFFNESS/HIGH FEVER/ETC) Other reaction(s): Other (See Comments) SEVERE ALLERGIC REACTIONS (NECK STIFFNESS/HIGH FEVER/ETC)  . Tdap [Tetanus-Diphth-Acell Pertussis] Other (See Comments)    SEVERE ALLERGIC REACTIONS (NECK STIFFNESS/HIGH FEVER/ETC)  . Tetanus Toxoid, Adsorbed     Neck stiffness, and high fever Other reaction(s): Other (See Comments) Neck stiffness, and high fever    Social History   Socioeconomic History  . Marital status: Married    Spouse name: Not on file  . Number of children: Not on file  . Years of education: Not on file  . Highest education level: Not on file  Occupational History  . Not on file  Tobacco Use  . Smoking status: Former Smoker    Types: Cigarettes  . Smokeless tobacco: Never Used  . Tobacco comment: smoked infrequently when she was young; quit 79 yo   Vaping Use  . Vaping Use: Never used  Substance and Sexual Activity  . Alcohol use: No  . Drug use: No  . Sexual activity: Yes    Partners: Male    Birth control/protection: Surgical  Other Topics Concern  . Not on file  Social History Narrative  . Not on file   Social Determinants of Health   Financial Resource Strain:   . Difficulty of Paying Living Expenses: Not on file  Food  Insecurity:   . Worried About Charity fundraiser in the Last Year: Not on file  . Ran Out of Food in the Last Year: Not on file  Transportation Needs:   . Lack of Transportation (Medical): Not on file  . Lack of Transportation (Non-Medical): Not on file  Physical Activity:   . Days of Exercise per Week: Not on file  . Minutes of Exercise per Session: Not on file  Stress:   . Feeling of Stress : Not on file  Social Connections:   . Frequency of Communication with Friends and Family: Not on file  . Frequency of Social Gatherings with Friends and Family: Not on file  . Attends Religious Services: Not on file  . Active Member of Clubs or Organizations: Not on file  . Attends Archivist Meetings: Not on file  . Marital Status: Not on file   Vitals:   07/23/20 0754  BP: 120/70  Pulse: 70  Resp: 16  SpO2: 96%   Body mass index is 24.09 kg/m.  Physical Exam Vitals and nursing note reviewed.  Constitutional:  General: She is not in acute distress.    Appearance: She is well-developed.  HENT:     Head: Normocephalic and atraumatic.     Mouth/Throat:     Mouth: Mucous membranes are moist. No oral lesions.     Tongue: No lesions.     Pharynx: Oropharynx is clear.  Eyes:     Conjunctiva/sclera: Conjunctivae normal.     Pupils: Pupils are equal, round, and reactive to light.  Cardiovascular:     Rate and Rhythm: Normal rate and regular rhythm.     Pulses:          Dorsalis pedis pulses are 2+ on the right side and 2+ on the left side.     Heart sounds: No murmur heard.   Pulmonary:     Effort: Pulmonary effort is normal. No respiratory distress.     Breath sounds: Normal breath sounds.  Abdominal:     Palpations: Abdomen is soft. There is no hepatomegaly or mass.     Tenderness: There is no abdominal tenderness.  Musculoskeletal:     Comments: Antalgic gait.  Lymphadenopathy:     Cervical: No cervical adenopathy.  Skin:    General: Skin is warm.      Findings: No erythema or rash.  Neurological:     Mental Status: She is alert and oriented to person, place, and time.     Cranial Nerves: No cranial nerve deficit.  Psychiatric:     Comments: Well groomed, good eye contact.   ASSESSMENT AND PLAN:  Ms.Margaret was seen today for white coating on tongue.  Diagnoses and all orders for this visit: Orders Placed This Encounter  Procedures  . CT Chest Wo Contrast    Insomnia Improved. Continue Melatonin 10 mg before bedtime. Temazepam to continue as needed. Good sleep hygiene.  Generalized anxiety disorder Stable. Continue Sertraline 150 mg daily.  Major depressive disorder, recurrent episode, moderate (HCC) Well controlled. Continue Sertraline 150 mg daily.  Peripheral neuropathy Gabapentin is helping, so continue 300 mg bid. Fall precautions. Good foot care.  GERD (gastroesophageal reflux disease) Could be contributing to cough. I do not appreciate tongue abnormalities.  Continue Protonix 40 mg if needed. We could consider decreasing dose of Protonix from 40 mg to 20 mg if she needs to be back to daily med. GERD precautions to continue.  Chronic cough We discussed possible etiologies. ? Allergies, GERD. Chest CT will be arranged.  Atherosclerosis of aorta (HCC) Seen on CXR. Continue Atorvastatin 20 mg daily.  Skin pruritus Possible etiologies discussed: ? Dry skin, neurodermatitis among some. Hydroxyzine helps, so no changes.       Prefes to hold on Doxepin for pruritus or derma referral.  Side effects of medications discussed. Stop Cetirizine.  -     hydrOXYzine (ATARAX/VISTARIL) 25 MG tablet; Take 1 tablet (25 mg total) by mouth daily as needed.   Return in about 6 months (around 01/21/2021).   Longino Trefz G. Martin[place], MD  [facility]. [place] office.  A few things to remember from today's visit:  Generalized anxiety disorder  Major depressive disorder, recurrent episode, moderate  (HCC)  Insomnia, unspecified type  La lengua parece normal hoy. Continue Hydroxyzine si lo necesita. Stop Cetirizine. If you want derma evaluation, we can arrange. Chest CT will be arrange to evaluate cough.  If heartburn comes back we will need to resume Pantoprazole, we could try 20 mg instead 40 mg.  If you need refills please call your pharmacy. Do not use  My Chart to request refills or for acute issues that need immediate attention.    Please be sure medication list is accurate. If a new problem present, please set up appointment sooner than planned today.

## 2020-07-23 NOTE — Assessment & Plan Note (Signed)
Stable. Continue Sertraline 150 mg daily.

## 2020-07-26 DIAGNOSIS — M19072 Primary osteoarthritis, left ankle and foot: Secondary | ICD-10-CM | POA: Diagnosis not present

## 2020-07-26 DIAGNOSIS — M19071 Primary osteoarthritis, right ankle and foot: Secondary | ICD-10-CM | POA: Diagnosis not present

## 2020-07-28 ENCOUNTER — Telehealth: Payer: Self-pay | Admitting: Family Medicine

## 2020-07-28 NOTE — Telephone Encounter (Signed)
Pt is calling in stating that she is still having issues with her stomach and would like to have a refill on pantoprazole (PROTONIX) 20 MG (change in dosage per the provider).   Pharm:  [facility] [address]

## 2020-07-28 NOTE — Telephone Encounter (Signed)
Okay to send in 20mg ?

## 2020-07-30 ENCOUNTER — Other Ambulatory Visit: Payer: Self-pay | Admitting: Family Medicine

## 2020-07-30 DIAGNOSIS — K219 Gastro-esophageal reflux disease without esophagitis: Secondary | ICD-10-CM

## 2020-07-30 MED ORDER — PANTOPRAZOLE SODIUM 20 MG PO TBEC: 20.0000 mg | DELAYED_RELEASE_TABLET | Freq: Every day | ORAL | 1 refills | Status: DC

## 2020-07-30 NOTE — Telephone Encounter (Signed)
It is okay to take Protonix 20 mg daily before breakfast. Prescription sent. Thanks, BJ

## 2020-07-30 NOTE — Telephone Encounter (Signed)
Rx has been sent  

## 2020-08-02 ENCOUNTER — Ambulatory Visit: Payer: Medicare Other | Admitting: Family Medicine

## 2020-08-05 DIAGNOSIS — M0579 Rheumatoid arthritis with rheumatoid factor of multiple sites without organ or systems involvement: Secondary | ICD-10-CM | POA: Diagnosis not present

## 2020-08-10 ENCOUNTER — Other Ambulatory Visit: Payer: Self-pay | Admitting: Family Medicine

## 2020-08-10 DIAGNOSIS — G4700 Insomnia, unspecified: Secondary | ICD-10-CM

## 2020-08-24 NOTE — Addendum Note (Signed)
Addended by: Nathanial Millman E on: 08/24/2020 11:50 AM   Modules accepted: Orders

## 2020-09-07 DIAGNOSIS — M2550 Pain in unspecified joint: Secondary | ICD-10-CM | POA: Diagnosis not present

## 2020-09-07 DIAGNOSIS — Z6823 Body mass index (BMI) 23.0-23.9, adult: Secondary | ICD-10-CM | POA: Diagnosis not present

## 2020-09-07 DIAGNOSIS — G8929 Other chronic pain: Secondary | ICD-10-CM | POA: Diagnosis not present

## 2020-09-07 DIAGNOSIS — M316 Other giant cell arteritis: Secondary | ICD-10-CM | POA: Diagnosis not present

## 2020-09-07 DIAGNOSIS — M0589 Other rheumatoid arthritis with rheumatoid factor of multiple sites: Secondary | ICD-10-CM | POA: Diagnosis not present

## 2020-09-07 DIAGNOSIS — Z7952 Long term (current) use of systemic steroids: Secondary | ICD-10-CM | POA: Diagnosis not present

## 2020-09-07 DIAGNOSIS — M8589 Other specified disorders of bone density and structure, multiple sites: Secondary | ICD-10-CM | POA: Diagnosis not present

## 2020-09-07 DIAGNOSIS — M25561 Pain in right knee: Secondary | ICD-10-CM | POA: Diagnosis not present

## 2020-09-07 DIAGNOSIS — M5136 Other intervertebral disc degeneration, lumbar region: Secondary | ICD-10-CM | POA: Diagnosis not present

## 2020-09-07 DIAGNOSIS — R5383 Other fatigue: Secondary | ICD-10-CM | POA: Diagnosis not present

## 2020-09-07 DIAGNOSIS — M25511 Pain in right shoulder: Secondary | ICD-10-CM | POA: Diagnosis not present

## 2020-09-08 DIAGNOSIS — Z79899 Other long term (current) drug therapy: Secondary | ICD-10-CM | POA: Diagnosis not present

## 2020-09-08 DIAGNOSIS — G629 Polyneuropathy, unspecified: Secondary | ICD-10-CM | POA: Diagnosis not present

## 2020-09-08 DIAGNOSIS — E559 Vitamin D deficiency, unspecified: Secondary | ICD-10-CM | POA: Diagnosis not present

## 2020-09-08 DIAGNOSIS — M0579 Rheumatoid arthritis with rheumatoid factor of multiple sites without organ or systems involvement: Secondary | ICD-10-CM | POA: Diagnosis not present

## 2020-09-08 DIAGNOSIS — M0589 Other rheumatoid arthritis with rheumatoid factor of multiple sites: Secondary | ICD-10-CM | POA: Diagnosis not present

## 2020-09-10 DIAGNOSIS — M19071 Primary osteoarthritis, right ankle and foot: Secondary | ICD-10-CM | POA: Diagnosis not present

## 2020-09-10 DIAGNOSIS — M25572 Pain in left ankle and joints of left foot: Secondary | ICD-10-CM | POA: Diagnosis not present

## 2020-09-10 DIAGNOSIS — M792 Neuralgia and neuritis, unspecified: Secondary | ICD-10-CM | POA: Diagnosis not present

## 2020-09-10 DIAGNOSIS — M19072 Primary osteoarthritis, left ankle and foot: Secondary | ICD-10-CM | POA: Diagnosis not present

## 2020-09-13 ENCOUNTER — Other Ambulatory Visit: Payer: Self-pay | Admitting: Family Medicine

## 2020-09-13 DIAGNOSIS — G629 Polyneuropathy, unspecified: Secondary | ICD-10-CM

## 2020-09-14 ENCOUNTER — Ambulatory Visit
Admission: RE | Admit: 2020-09-14 | Discharge: 2020-09-14 | Disposition: A | Discharge status: Home / Self Care | Payer: Medicare Other | Source: Ambulatory Visit | Attending: Family Medicine | Admitting: Family Medicine

## 2020-09-14 DIAGNOSIS — R053 Chronic cough: Secondary | ICD-10-CM

## 2020-09-15 ENCOUNTER — Encounter: Payer: Self-pay | Admitting: Family Medicine

## 2020-09-16 ENCOUNTER — Other Ambulatory Visit: Payer: Self-pay | Admitting: Physician Assistant

## 2020-09-20 ENCOUNTER — Ambulatory Visit (INDEPENDENT_AMBULATORY_CARE_PROVIDER_SITE_OTHER): Payer: Medicare Other | Admitting: Physician Assistant

## 2020-09-20 ENCOUNTER — Encounter: Payer: Self-pay | Admitting: Physician Assistant

## 2020-09-20 VITALS — BP 116/54 | HR 62 | Ht 63.0 in | Wt 136.6 lb

## 2020-09-20 DIAGNOSIS — K219 Gastro-esophageal reflux disease without esophagitis: Secondary | ICD-10-CM

## 2020-09-20 DIAGNOSIS — K5900 Constipation, unspecified: Secondary | ICD-10-CM | POA: Diagnosis not present

## 2020-09-20 MED ORDER — PANTOPRAZOLE SODIUM 20 MG PO TBEC: 20.0000 mg | DELAYED_RELEASE_TABLET | Freq: Every day | ORAL | 3 refills | Status: DC

## 2020-09-20 MED ORDER — LINACLOTIDE 145 MCG PO CAPS: 145.0000 ug | ORAL_CAPSULE | Freq: Every day | ORAL | 3 refills | Status: DC

## 2020-09-20 MED ORDER — LINACLOTIDE 145 MCG PO CAPS
145.0000 ug | ORAL_CAPSULE | Freq: Every day | ORAL | 3 refills | Status: DC
Start: 2020-09-20 — End: 2020-09-20

## 2020-09-20 NOTE — Addendum Note (Signed)
Addended by: Darrall Dears on: 09/20/2020 03:39 PM   Modules accepted: Orders

## 2020-09-20 NOTE — Patient Instructions (Signed)
If you are age 78 or older, your body mass index should be between 23-30. Your Body mass index is 24.2 kg/m. If this is out of the aforementioned range listed, please consider follow up with your Primary Care Provider.  If you are age 5 or younger, your body mass index should be between 19-25. Your Body mass index is 24.2 kg/m. If this is out of the aformentioned range listed, please consider follow up with your Primary Care Provider.   Thank you for choosing me and [facility].  Ellouise Newer, PA-c

## 2020-09-20 NOTE — Progress Notes (Signed)
Chief Complaint: Follow-up constipation and GERD  HPI:    Jennifer Hull is a 79 year old female with a past medical history as listed below including rheumatoid arthritis, known to Dr. Rush Landmark, who presents clinic today for follow-up of her constipation and GERD.     Today, the patient explains that she is doing very well as long as she takes her Pantoprazole 40 mg every morning and her Linzess 145 mcg daily.  Tells me that occasionally she will go 2 days without a bowel movement but then she will have 1 on the third.  She denies any gas or bloating and is very happy.    Her daughter is a Surveyor, mining and lives in [place].  The patient only gets to see her once every 3 years.    Denies fever, chills, weight loss, blood in her stool, abdominal pain, bloating or symptoms that awaken her from sleep.  Past Medical History:  Diagnosis Date  . Anxiety   . Arthritis   . Depression   . Hyperlipidemia   . Rheumatoid arthritis ([place]) 2019  . Temporal arteritis ([place])   . Thyroid disease     Past Surgical History:  Procedure Laterality Date  . ABDOMINAL HYSTERECTOMY    . BREAST EXCISIONAL BIOPSY Left   . BREAST SURGERY     biopsy  . COLONOSCOPY    . TEMPORAL ARTERY BIOPSY / LIGATION Bilateral 2014  . TONSILLECTOMY    . TOTAL SHOULDER ARTHROPLASTY Right 11/22/2017   Procedure: RIGHT REVERSE TOTAL SHOULDER ARTHROPLASTY;  Surgeon: Tania Ade, MD;  Location: [facility];  Service: Orthopedics;  Laterality: Right;    Current Outpatient Medications  Medication Sig Dispense Refill  . acetaminophen (TYLENOL) 500 MG tablet Take by mouth.    Marland Kitchen atorvastatin (LIPITOR) 20 MG tablet Take 1 tablet (20 mg total) by mouth daily. 90 tablet 3  . Calcium Carb-Cholecalciferol (CALCIUM 600+D3 PO) Take 1 tablet by mouth daily with lunch.    Arna Medici 112 MCG tablet TAKE 1 TABLET BY MOUTH ONCE DAILY BEFORE BREAKFAST 90 tablet 1  . folic acid (FOLVITE) 595 MCG tablet Take 400 mcg by mouth daily.    Marland Kitchen gabapentin  (NEURONTIN) 300 MG capsule Take 1 capsule by mouth twice daily 60 capsule 3  . hydrOXYzine (ATARAX/VISTARIL) 25 MG tablet Take 1 tablet (25 mg total) by mouth daily as needed. 30 tablet 2  . linaclotide (LINZESS) 145 MCG CAPS capsule Take 1 capsule (145 mcg total) by mouth daily before breakfast. 90 capsule 3  . Melatonin 10 MG CAPS Take by mouth at bedtime.    . pantoprazole (PROTONIX) 20 MG tablet Take 1 tablet (20 mg total) by mouth daily before breakfast. 90 tablet 1  . Probiotic Product (PROBIOTIC PO) Take 1 capsule by mouth daily.    . sertraline (ZOLOFT) 100 MG tablet TAKE 1 & 1/2 (ONE & ONE-HALF) TABLETS BY MOUTH ONCE DAILY 135 tablet 2  . temazepam (RESTORIL) 15 MG capsule TAKE 1 CAPSULE BY MOUTH AT BEDTIME AS NEEDED FOR SLEEP 30 capsule 2  . Tocilizumab (ACTEMRA IV) Inject into the vein every 30 (thirty) days.     No current facility-administered medications for this visit.    Allergies as of 09/20/2020 - Review Complete 09/20/2020  Allergen Reaction Noted  . Other  07/27/2016  . Tdap [tetanus-diphth-acell pertussis] Other (See Comments) 07/27/2016  . Tetanus toxoid, adsorbed  12/07/2017    Family History  Problem Relation Age of Onset  . Kidney cancer Mother   .  Heart disease Father   . Breast cancer Maternal Aunt   . Colon cancer Neg Hx   . Esophageal cancer Neg Hx   . Rectal cancer Neg Hx   . Stomach cancer Neg Hx     Social History   Socioeconomic History  . Marital status: Married    Spouse name: Not on file  . Number of children: Not on file  . Years of education: Not on file  . Highest education level: Not on file  Occupational History  . Not on file  Tobacco Use  . Smoking status: Former Smoker    Types: Cigarettes  . Smokeless tobacco: Never Used  . Tobacco comment: smoked infrequently when she was young; quit 79 yo   Vaping Use  . Vaping Use: Never used  Substance and Sexual Activity  . Alcohol use: No  . Drug use: No  . Sexual activity: Yes     Partners: Male    Birth control/protection: Surgical  Other Topics Concern  . Not on file  Social History Narrative  . Not on file   Social Determinants of Health   Financial Resource Strain: Not on file  Food Insecurity: Not on file  Transportation Needs: Not on file  Physical Activity: Not on file  Stress: Not on file  Social Connections: Not on file  Intimate Partner Violence: Not on file    Review of Systems:    Constitutional: No weight loss, fever or chills Cardiovascular: No chest pain Respiratory: No SOB  Gastrointestinal: See HPI and otherwise negative   Physical Exam:  Vital signs: BP (!) 116/54   Pulse 62   Ht 5\' 3"  (1.6 m)   Wt 136 lb 9.6 oz (62 kg)   SpO2 98%   BMI 24.20 kg/m   Constitutional:   Pleasant Elderly Caucasian female appears to be in NAD, Well developed, Well nourished, alert and cooperative Respiratory: Respirations even and unlabored. Lungs clear to auscultation bilaterally.   No wheezes, crackles, or rhonchi.  Cardiovascular: Normal S1, S2. No MRG. Regular rate and rhythm. No peripheral edema, cyanosis or pallor.  Gastrointestinal:  Soft, nondistended, nontender. No rebound or guarding. Normal bowel sounds. No appreciable masses or hepatomegaly. Rectal:  Not performed.  Psychiatric: Demonstrates good judgement and reason without abnormal affect or behaviors.  No recent labs or imaging.  Assessment: 1.  Constipation: Controlled on Linzess 145 mcg daily 2.  GERD: Controlled on Pantoprazole 40 mg daily  Plan: 1.  Refilled patient's Pantoprazole 40 mg daily, 30 to 60 minutes before breakfast #90 with 3 refills. 2.  Refilled patient's Linzess 145 mcg daily, 30 to 60 minutes before eating #90 with 3 refills. 3.  Patient to follow in clinic in a year for further refills or sooner if necessary.  Jennifer Newer, PA-C [facility] 09/20/2020, 3:13 PM  Cc: Jennifer Hull, Jennifer G, MD

## 2020-09-21 NOTE — Progress Notes (Signed)
Attending Physician's Attestation   I have reviewed the chart.   I agree with the Advanced Practitioner's note, impression, and recommendations with any updates as below.    Tauheedah Bok Mansouraty, MD [facility] Advanced Endoscopy Office # [phone]  

## 2020-09-23 ENCOUNTER — Other Ambulatory Visit: Payer: Self-pay | Admitting: Family Medicine

## 2020-09-23 DIAGNOSIS — R059 Cough, unspecified: Secondary | ICD-10-CM

## 2020-09-28 ENCOUNTER — Telehealth: Payer: Self-pay

## 2020-09-28 NOTE — Telephone Encounter (Signed)
Received Linzess prescription from [employer] [place] - 3 bottles of Linzess 145 mcg #30 capsules  Spoke with patient and she is aware that she will need to stop by our office to pick up prescription from the 3rd floor. Advised patient that she will need to arrive before 4:30 Pm. Patient verbalized understanding of all information and had no concerns at the end of the call.

## 2020-10-12 ENCOUNTER — Encounter: Payer: Self-pay | Admitting: Family Medicine

## 2020-10-13 IMAGING — MG DIGITAL SCREENING BILAT W/ TOMO W/ CAD
8 series · 8 of 24 positions shown · non-contrast
Comparison: Previous exam(s).

CLINICAL DATA: Screening.

EXAM:
DIGITAL SCREENING BILATERAL MAMMOGRAM WITH TOMO AND CAD

[R CC synth-2D]
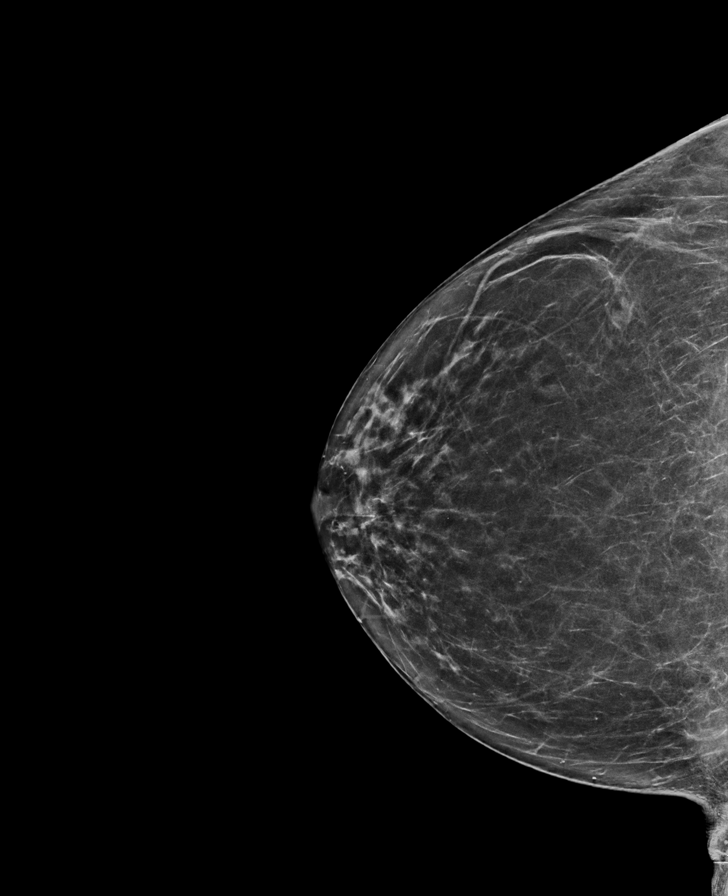

[L CC synth-2D]
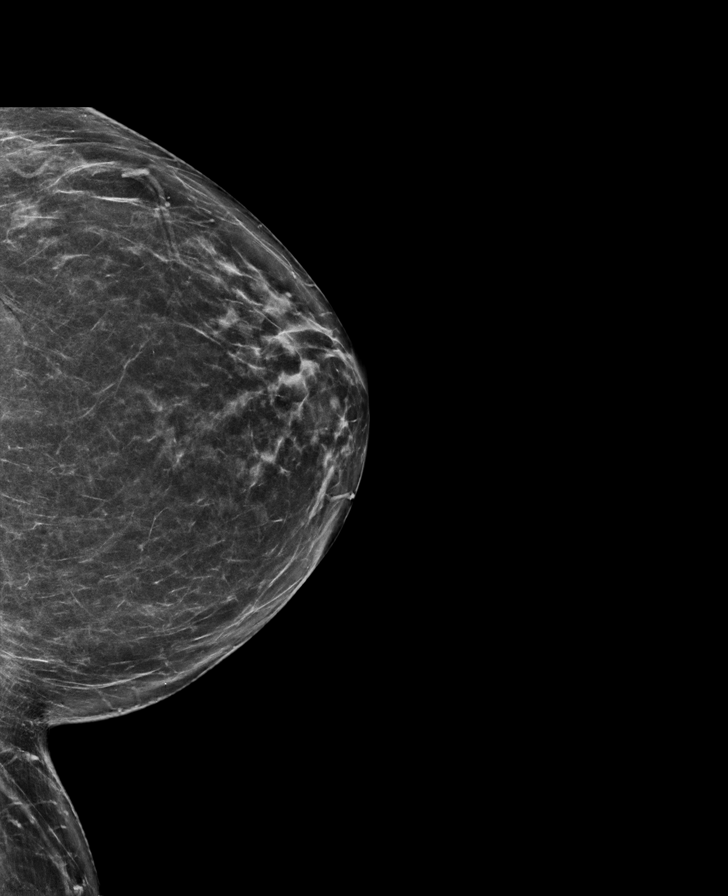

[R MLO synth-2D]
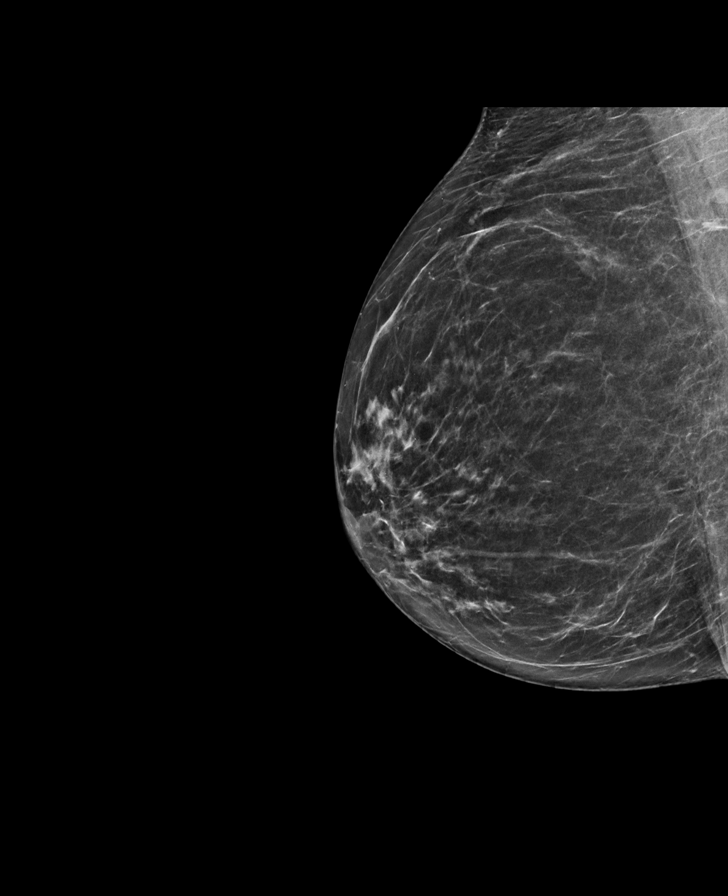

[L MLO synth-2D]
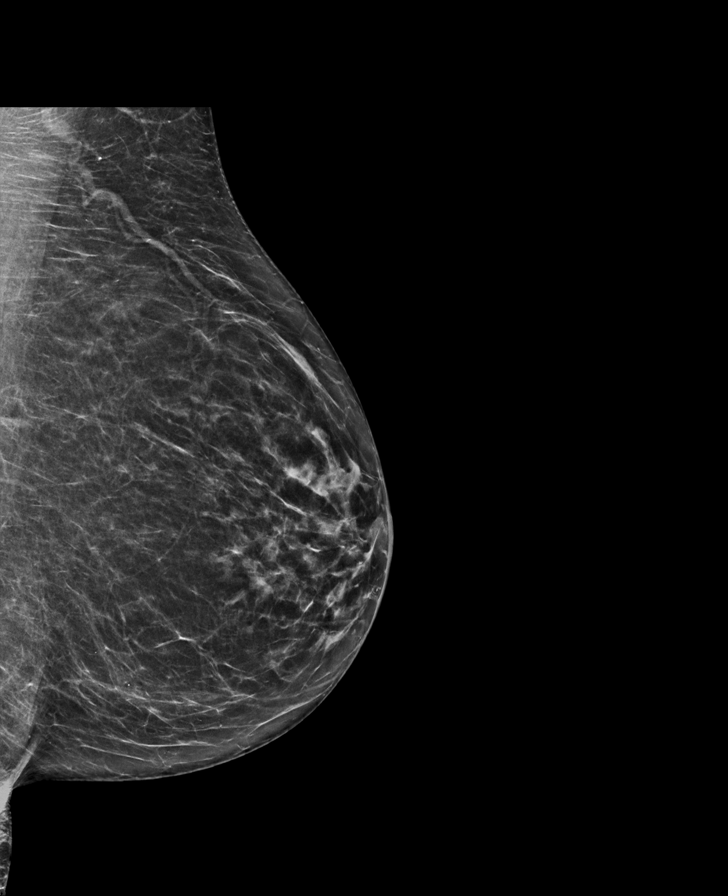

[L MLO tomo · tomo slice 33/64.0]
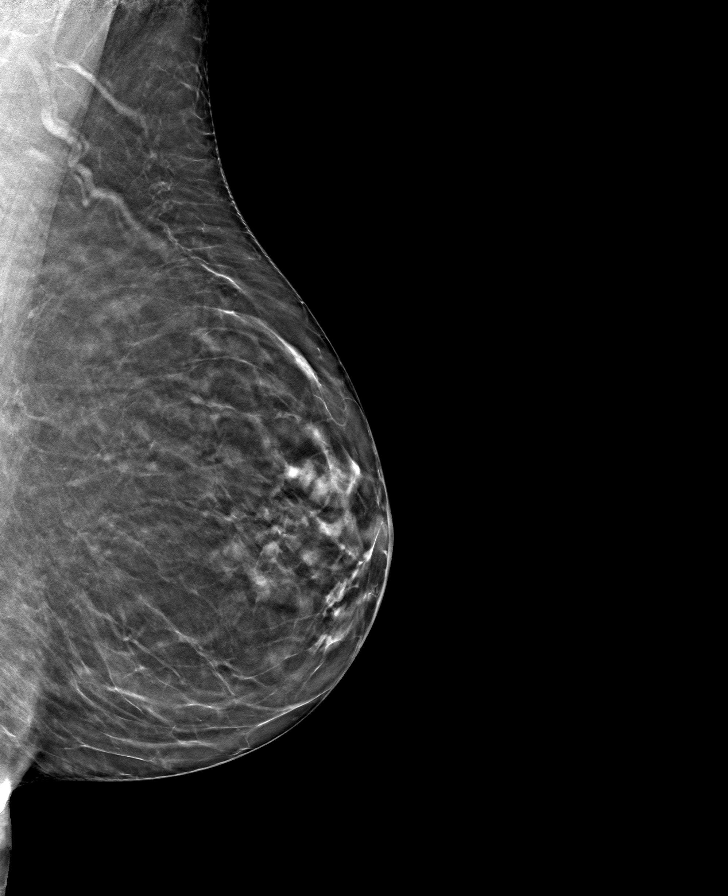

[R CC tomo · tomo slice 32/63.0]
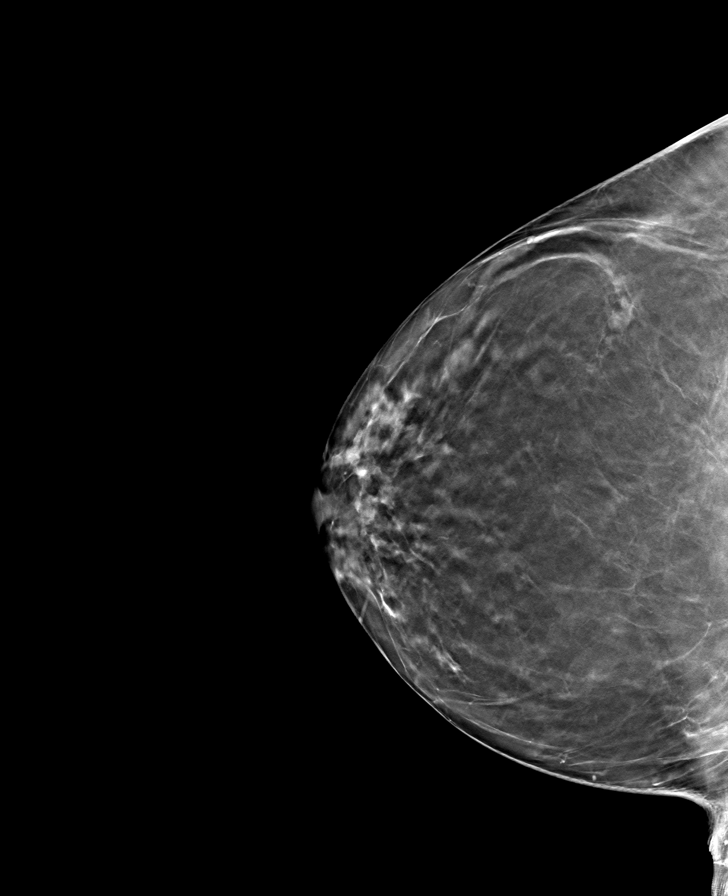

[L CC tomo · tomo slice 33/66.0]
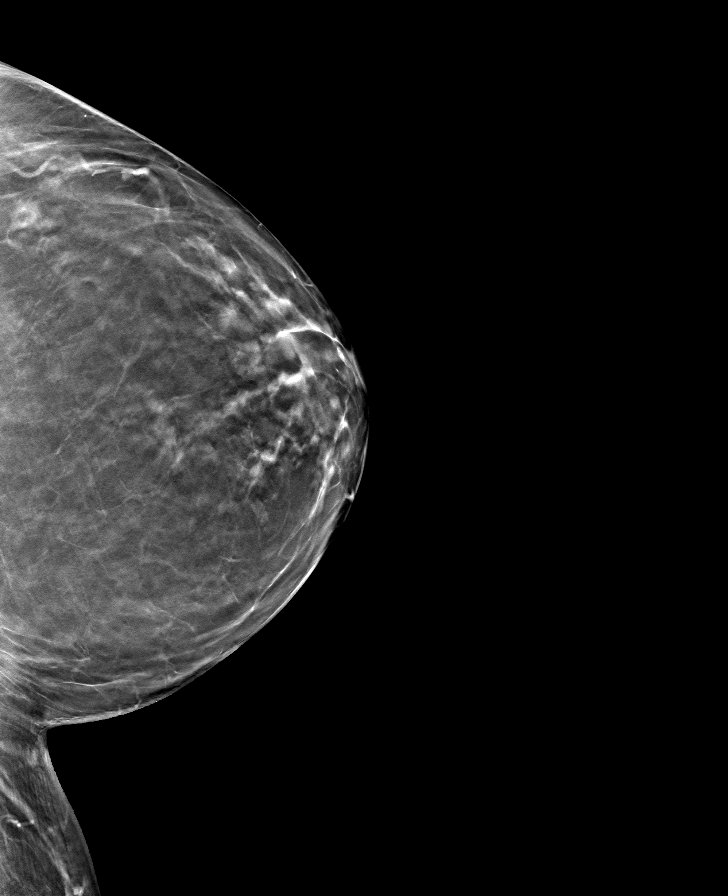

[R MLO tomo · tomo slice 33/64.0]
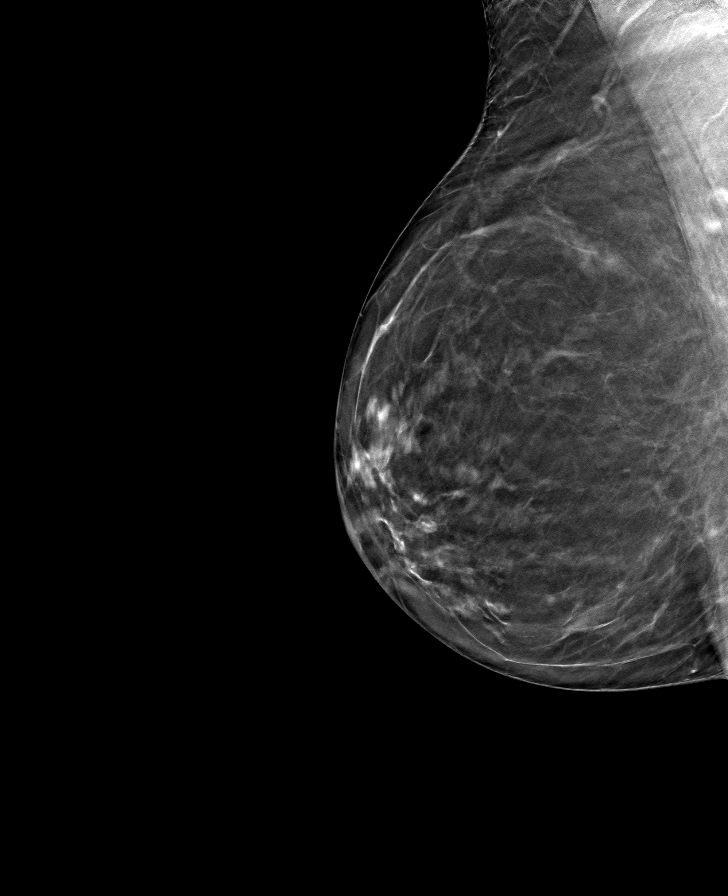

[8 of 24 positions shown; findings below may reference images not displayed]

ACR Breast Density Category b: There are scattered areas of
fibroglandular density.
FINDINGS: There are no findings suspicious for malignancy. Images were
processed with CAD.
IMPRESSION: No mammographic evidence of malignancy. A result letter of this
screening mammogram will be mailed directly to the patient.

RECOMMENDATION:
Screening mammogram in one year. (Code:[id number])

BI-RADS CATEGORY  1: Negative.

## 2020-10-15 ENCOUNTER — Institutional Professional Consult (permissible substitution): Payer: Medicare Other | Admitting: Emergency Medicine

## 2020-10-19 DIAGNOSIS — H04123 Dry eye syndrome of bilateral lacrimal glands: Secondary | ICD-10-CM | POA: Diagnosis not present

## 2020-10-22 DIAGNOSIS — M0579 Rheumatoid arthritis with rheumatoid factor of multiple sites without organ or systems involvement: Secondary | ICD-10-CM | POA: Diagnosis not present

## 2020-10-27 DIAGNOSIS — M19072 Primary osteoarthritis, left ankle and foot: Secondary | ICD-10-CM | POA: Diagnosis not present

## 2020-10-29 NOTE — Telephone Encounter (Signed)
Pt is requesting a call back from a nurse to discuss the [place] and obtain an update on it.

## 2020-10-29 NOTE — Telephone Encounter (Signed)
Spoke with patient, she wanted to check and make sure we received her forms for [employer] assist. Advised that we received all of her documents and they were faxed to [place] on 10/26/2020. Patient is aware that we are waiting on a response from [facility], she verbalized understanding and had no concerns at the end of the call.

## 2020-11-04 ENCOUNTER — Institutional Professional Consult (permissible substitution): Payer: Medicare Other | Admitting: Emergency Medicine

## 2020-11-05 ENCOUNTER — Encounter: Payer: Self-pay | Admitting: Family Medicine

## 2020-11-22 DIAGNOSIS — M0579 Rheumatoid arthritis with rheumatoid factor of multiple sites without organ or systems involvement: Secondary | ICD-10-CM | POA: Diagnosis not present

## 2020-11-24 ENCOUNTER — Encounter: Payer: Self-pay | Admitting: Emergency Medicine

## 2020-11-24 ENCOUNTER — Ambulatory Visit (INDEPENDENT_AMBULATORY_CARE_PROVIDER_SITE_OTHER): Payer: Medicare Other | Admitting: Emergency Medicine

## 2020-11-24 ENCOUNTER — Other Ambulatory Visit: Payer: Self-pay

## 2020-11-24 VITALS — BP 110/72 | HR 61 | Temp 97.3°F | Ht 60.0 in | Wt 132.8 lb

## 2020-11-24 DIAGNOSIS — R9389 Abnormal findings on diagnostic imaging of other specified body structures: Secondary | ICD-10-CM

## 2020-11-24 DIAGNOSIS — R918 Other nonspecific abnormal finding of lung field: Secondary | ICD-10-CM | POA: Diagnosis not present

## 2020-11-24 DIAGNOSIS — R053 Chronic cough: Secondary | ICD-10-CM

## 2020-11-24 MED ORDER — LORATADINE 10 MG PO TABS: 10.0000 mg | ORAL_TABLET | Freq: Every day | ORAL | 11 refills | Status: DC

## 2020-11-24 NOTE — Patient Instructions (Signed)
We will plan to repeat your CT scan of the chest without contrast in 6 months to compare with your prior. Please continue your pantoprazole as you have been taking it. Try starting loratadine 10 mg once daily Try starting fluticasone nasal spray, 2 sprays each nostril once daily. You can still use your saline nasal spray when you need it to help with your congestion. Follow Dr. Lamonte Sakai in June 2022 after your CT chest so that we can review the results together.

## 2020-11-24 NOTE — Assessment & Plan Note (Signed)
Longstanding but with waxing and waning characteristics.  She is unsure if it seasonal.  She does have chronic rhinitis, also chronic GERD both which could be playing a role.  When taken in the context of her subtle CT scan findings must also consider possible mycobacterial colonization or other inflammatory process.  Her cough is quite right now.  I think would be reasonable to try maximally treat rhinitis, GERD to avoid recurrence.  If we create a correlation between her micronodular disease and her cough, if there are CT changes, then bronchoscopy would be warranted.  Discussed this with her today   Please continue your pantoprazole as you have been taking it. Try starting loratadine 10 mg once daily Try starting fluticasone nasal spray, 2 sprays each nostril once daily. You can still use your saline nasal spray when you need it to help with your congestion.

## 2020-11-24 NOTE — Assessment & Plan Note (Signed)
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  She is on Tocilizumab and a subtle bronchiolitis is also possible.  In absence of symptoms I think most appropriate neck step is to repeat her CT chest for stability, interval change in we will do this in 6 months.  If indicated will advance to work-up, possibly bronchoscopy for culture data, etc.

## 2020-11-24 NOTE — Progress Notes (Signed)
Subjective:    Patient ID: Jennifer Hull, female    DOB: December 12, 1940, 80 y.o.   MRN: [id number]  HPI 80 year old woman, minimal tobacco history (12 pack years, quit 1978), with a history of rheumatoid arthritis on Tocilizumab, temporal arteritis, hypothyroidism, depression, chronic cough. She had COVID 19 in late 2021  She began to experience cough that has had offs/ons for the last 2-3 years. Cough has been dry, can happen at any time during the day. She has had periods when it was quiet - can't remember if this seasonal. Cough was very active in December-January when she had COVID, is now improved.   She has a lot of congestion, nasal drainage, clear. She uses saline spray, uses very morning for congestion. She has reflux, is on pantoprazole but still some breakthrough. Sometimes takes her PPI 2x a day. Cough does not wake her from sleep.   She underwent CT scan of the chest on 09/14/2020 to evaluate her cough which I have reviewed, shows no mediastinal lymphadenopathy, numerous subtle tiny centrilobular nodules that are apical predominant of unclear etiology.  No evidence of interstitial disease.    Review of Systems As per HPI  Past Medical History:  Diagnosis Date  . Anxiety   . Arthritis   . Depression   . Hyperlipidemia   . Rheumatoid arthritis ([place]) 2019  . Temporal arteritis ([place])   . Thyroid disease      Family History  Problem Relation Age of Onset  . Kidney cancer Mother   . Heart disease Father   . Breast cancer Maternal Aunt   . Colon cancer Neg Hx   . Esophageal cancer Neg Hx   . Rectal cancer Neg Hx   . Stomach cancer Neg Hx      Social History   Socioeconomic History  . Marital status: Married    Spouse name: Not on file  . Number of children: Not on file  . Years of education: Not on file  . Highest education level: Not on file  Occupational History  . Not on file  Tobacco Use  . Smoking status: Former Smoker    Packs/day: 1.50    Years: 8.00     Pack years: 12.00    Types: Cigarettes    Quit date: 1978    Years since quitting: 44.1  . Smokeless tobacco: Never Used  . Tobacco comment: smoked infrequently when she was young; quit 80 yo   Vaping Use  . Vaping Use: Never used  Substance and Sexual Activity  . Alcohol use: No  . Drug use: No  . Sexual activity: Yes    Partners: Male    Birth control/protection: Surgical  Other Topics Concern  . Not on file  Social History Narrative  . Not on file   Social Determinants of Health   Financial Resource Strain: Not on file  Food Insecurity: Not on file  Transportation Needs: Not on file  Physical Activity: Not on file  Stress: Not on file  Social Connections: Not on file  Intimate Partner Violence: Not on file    From [place], has also lived in [place], [place], [place] No known inhaled exposures.   Allergies  Allergen Reactions  . Other     SEVERE ALLERGIC REACTIONS (NECK STIFFNESS/HIGH FEVER/ETC) Other reaction(s): Other (See Comments) SEVERE ALLERGIC REACTIONS (NECK STIFFNESS/HIGH FEVER/ETC)  . Tdap [Tetanus-Diphth-Acell Pertussis] Other (See Comments)    SEVERE ALLERGIC REACTIONS (NECK STIFFNESS/HIGH FEVER/ETC)  . Tetanus Toxoid, Adsorbed  Neck stiffness, and high fever Other reaction(s): Other (See Comments) Neck stiffness, and high fever     Outpatient Medications Prior to Visit  Medication Sig Dispense Refill  . acetaminophen (TYLENOL) 500 MG tablet Take by mouth.    Marland Kitchen atorvastatin (LIPITOR) 20 MG tablet Take 1 tablet (20 mg total) by mouth daily. 90 tablet 3  . Calcium Carb-Cholecalciferol (CALCIUM 600+D3 PO) Take 1 tablet by mouth daily with lunch.    Arna Medici 112 MCG tablet TAKE 1 TABLET BY MOUTH ONCE DAILY BEFORE BREAKFAST 90 tablet 1  . folic acid (FOLVITE) 573 MCG tablet Take 400 mcg by mouth daily.    Marland Kitchen gabapentin (NEURONTIN) 300 MG capsule Take 1 capsule by mouth twice daily 60 capsule 3  . linaclotide (LINZESS) 145 MCG CAPS capsule Take 1 capsule (145  mcg total) by mouth daily before breakfast. 90 capsule 3  . Melatonin 10 MG CAPS Take by mouth at bedtime.    . pantoprazole (PROTONIX) 20 MG tablet Take 1 tablet (20 mg total) by mouth daily before breakfast. 90 tablet 3  . Probiotic Product (PROBIOTIC PO) Take 1 capsule by mouth daily.    . sertraline (ZOLOFT) 100 MG tablet TAKE 1 & 1/2 (ONE & ONE-HALF) TABLETS BY MOUTH ONCE DAILY 135 tablet 2  . temazepam (RESTORIL) 15 MG capsule TAKE 1 CAPSULE BY MOUTH AT BEDTIME AS NEEDED FOR SLEEP 30 capsule 2  . Tocilizumab (ACTEMRA IV) Inject into the vein every 30 (thirty) days.    . hydrOXYzine (ATARAX/VISTARIL) 25 MG tablet Take 1 tablet (25 mg total) by mouth daily as needed. (Patient not taking: Reported on 11/24/2020) 30 tablet 2   No facility-administered medications prior to visit.        Objective:   Physical Exam Vitals:   11/24/20 1054  BP: 110/72  Pulse: 61  Temp: (!) 97.3 F (36.3 C)  TempSrc: Temporal  SpO2: 99%  Weight: 132 lb 12.8 oz (60.2 kg)  Height: 5' (1.524 m)   Gen: Pleasant, well-nourished, in no distress,  normal affect  ENT: No lesions,  mouth clear,  oropharynx clear, no postnasal drip  Neck: No JVD, no stridor  Lungs: No use of accessory muscles, no crackles or wheezing on normal respiration, no wheeze on forced expiration  Cardiovascular: RRR, heart sounds normal, no murmur or gallops, no peripheral edema  Musculoskeletal: No deformities, no cyanosis or clubbing  Neuro: alert, awake, non focal  Skin: Warm, no lesions or rash     Assessment & Plan:  Chronic cough Longstanding but with waxing and waning characteristics.  She is unsure if it seasonal.  She does have chronic rhinitis, also chronic GERD both which could be playing a role.  When taken in the context of her subtle CT scan findings must also consider possible mycobacterial colonization or other inflammatory process.  Her cough is quite right now.  I think would be reasonable to try maximally  treat rhinitis, GERD to avoid recurrence.  If we create a correlation between her micronodular disease and her cough, if there are CT changes, then bronchoscopy would be warranted.  Discussed this with her today   Please continue your pantoprazole as you have been taking it. Try starting loratadine 10 mg once daily Try starting fluticasone nasal spray, 2 sprays each nostril once daily. You can still use your saline nasal spray when you need it to help with your congestion.  Pulmonary nodules Very subtle micronodular disease at the apices, etiology unclear but consider related  to her rheumatoid arthritis, consider also possible AFB given her chronic cough although pattern is not entirely consistent.  She is on Tocilizumab and a subtle bronchiolitis is also possible.  In absence of symptoms I think most appropriate neck step is to repeat her CT chest for stability, interval change in we will do this in 6 months.  If indicated will advance to work-up, possibly bronchoscopy for culture data, etc.  Baltazar Apo, MD, PhD 11/24/2020, 1:25 PM [facility] [phone] or if no answer before 7:00PM call [phone] For any issues after 7:00PM please call eLink [phone]

## 2020-11-29 DIAGNOSIS — M5416 Radiculopathy, lumbar region: Secondary | ICD-10-CM | POA: Diagnosis not present

## 2020-12-04 ENCOUNTER — Other Ambulatory Visit: Payer: Self-pay | Admitting: Family Medicine

## 2020-12-04 DIAGNOSIS — E039 Hypothyroidism, unspecified: Secondary | ICD-10-CM

## 2020-12-06 DIAGNOSIS — H6122 Impacted cerumen, left ear: Secondary | ICD-10-CM | POA: Diagnosis not present

## 2020-12-09 DIAGNOSIS — M5416 Radiculopathy, lumbar region: Secondary | ICD-10-CM | POA: Diagnosis not present

## 2020-12-20 DIAGNOSIS — M0579 Rheumatoid arthritis with rheumatoid factor of multiple sites without organ or systems involvement: Secondary | ICD-10-CM | POA: Diagnosis not present

## 2021-01-04 DIAGNOSIS — M5136 Other intervertebral disc degeneration, lumbar region: Secondary | ICD-10-CM | POA: Diagnosis not present

## 2021-01-04 DIAGNOSIS — M25561 Pain in right knee: Secondary | ICD-10-CM | POA: Diagnosis not present

## 2021-01-04 DIAGNOSIS — R5383 Other fatigue: Secondary | ICD-10-CM | POA: Diagnosis not present

## 2021-01-04 DIAGNOSIS — M8589 Other specified disorders of bone density and structure, multiple sites: Secondary | ICD-10-CM | POA: Diagnosis not present

## 2021-01-04 DIAGNOSIS — Z6822 Body mass index (BMI) 22.0-22.9, adult: Secondary | ICD-10-CM | POA: Diagnosis not present

## 2021-01-04 DIAGNOSIS — M0589 Other rheumatoid arthritis with rheumatoid factor of multiple sites: Secondary | ICD-10-CM | POA: Diagnosis not present

## 2021-01-04 DIAGNOSIS — M316 Other giant cell arteritis: Secondary | ICD-10-CM | POA: Diagnosis not present

## 2021-01-04 DIAGNOSIS — M25511 Pain in right shoulder: Secondary | ICD-10-CM | POA: Diagnosis not present

## 2021-01-04 DIAGNOSIS — M2550 Pain in unspecified joint: Secondary | ICD-10-CM | POA: Diagnosis not present

## 2021-01-04 DIAGNOSIS — Z7952 Long term (current) use of systemic steroids: Secondary | ICD-10-CM | POA: Diagnosis not present

## 2021-01-04 DIAGNOSIS — G8929 Other chronic pain: Secondary | ICD-10-CM | POA: Diagnosis not present

## 2021-01-05 ENCOUNTER — Other Ambulatory Visit: Payer: Self-pay | Admitting: Family Medicine

## 2021-01-05 DIAGNOSIS — G629 Polyneuropathy, unspecified: Secondary | ICD-10-CM

## 2021-01-17 ENCOUNTER — Other Ambulatory Visit: Payer: Self-pay | Admitting: Family Medicine

## 2021-01-17 DIAGNOSIS — G4700 Insomnia, unspecified: Secondary | ICD-10-CM

## 2021-01-18 DIAGNOSIS — M0579 Rheumatoid arthritis with rheumatoid factor of multiple sites without organ or systems involvement: Secondary | ICD-10-CM | POA: Diagnosis not present

## 2021-01-18 NOTE — Telephone Encounter (Signed)
Last filled 11/14/20

## 2021-01-21 ENCOUNTER — Ambulatory Visit: Payer: Medicare Other | Admitting: Family Medicine

## 2021-01-24 ENCOUNTER — Other Ambulatory Visit: Payer: Self-pay

## 2021-01-24 ENCOUNTER — Ambulatory Visit (INDEPENDENT_AMBULATORY_CARE_PROVIDER_SITE_OTHER): Payer: Medicare Other | Admitting: Family Medicine

## 2021-01-24 ENCOUNTER — Encounter: Payer: Self-pay | Admitting: Family Medicine

## 2021-01-24 VITALS — BP 112/78 | HR 76 | Temp 98.2°F | Resp 16 | Ht 60.0 in | Wt 134.4 lb

## 2021-01-24 DIAGNOSIS — F411 Generalized anxiety disorder: Secondary | ICD-10-CM | POA: Diagnosis not present

## 2021-01-24 DIAGNOSIS — M069 Rheumatoid arthritis, unspecified: Secondary | ICD-10-CM

## 2021-01-24 DIAGNOSIS — F331 Major depressive disorder, recurrent, moderate: Secondary | ICD-10-CM | POA: Diagnosis not present

## 2021-01-24 DIAGNOSIS — G629 Polyneuropathy, unspecified: Secondary | ICD-10-CM

## 2021-01-24 DIAGNOSIS — E785 Hyperlipidemia, unspecified: Secondary | ICD-10-CM

## 2021-01-24 DIAGNOSIS — G4700 Insomnia, unspecified: Secondary | ICD-10-CM | POA: Diagnosis not present

## 2021-01-24 DIAGNOSIS — I700 Atherosclerosis of aorta: Secondary | ICD-10-CM

## 2021-01-24 DIAGNOSIS — G894 Chronic pain syndrome: Secondary | ICD-10-CM | POA: Diagnosis not present

## 2021-01-24 DIAGNOSIS — E039 Hypothyroidism, unspecified: Secondary | ICD-10-CM

## 2021-01-24 MED ORDER — GABAPENTIN 300 MG PO CAPS: 1.0000 | ORAL_CAPSULE | Freq: Three times a day (TID) | ORAL | 3 refills | Status: DC

## 2021-01-24 MED ORDER — SERTRALINE HCL 100 MG PO TABS: 100.0000 mg | ORAL_TABLET | Freq: Every day | ORAL | 2 refills | Status: DC

## 2021-01-24 NOTE — Assessment & Plan Note (Addendum)
Problem is adequately controlled. Continue Atorvastatin 20 mg daily and low fat diet.

## 2021-01-24 NOTE — Assessment & Plan Note (Signed)
Problem is stable. Continue Temazepam 15 mg daily prn and Sertraline 100 mg daily.

## 2021-01-24 NOTE — Progress Notes (Signed)
HPI: Jennifer Hull is a 80 y.o. female, who is here today for 6 months follow up.   She was last seen on 07/23/20.  Since her last visit she has seen pulmonologist because of cough and pulmonary nodule. Loratadine 10 mg daily and Flonase nasal spray daily and 6 months f/u was recommended. Cough has resolved.  Peripheral neuropathy: She has been on gabapentin 300 mg twice daily, high few days ago she increased to 300 mg tid. Ortho gave her an injection in left lower back and improved. 2 weeks ago right lower back radiated to RLE down to ankle. Pain is worse at night. She is taking her husband's meloxicam 15 mg 1/2 tablet every 2 to 3 days, this medication really helps with pain.  She cannot take Meloxicam frequently because aggravates epistaxis.  Anxiety and insomnia: She has been under a lot of stress. Sleeps about 8 hours. She is on temazepam 15 mg daily at bedtime.  Depression: She feels like crying "sometimes." Chronic pain aggravates problem. She decreased sertraline dose from 150 mg to 100 mg daily and symptoms seems to be stable. She has been on same medications for several years.  Negative for suicidal thoughts.  Depression screen [facility] 2/9 01/24/2021 01/30/2020 01/01/2019 12/25/2017  Decreased Interest 0 1 0 0  Down, Depressed, Hopeless 0 1 0 0  PHQ - 2 Score 0 2 0 0  Altered sleeping 3 2 - -  Tired, decreased energy 2 1 - -  Change in appetite 0 0 - -  Feeling bad or failure about yourself  0 0 - -  Trouble concentrating 2 2 - -  Moving slowly or fidgety/restless 0 0 - -  Suicidal thoughts 0 0 - -  PHQ-9 Score 7 7 - -  Difficult doing work/chores Somewhat difficult Somewhat difficult - -   GAD 7 : Generalized Anxiety Score 01/24/2021 01/30/2020  Nervous, Anxious, on Edge 0 1  Control/stop worrying 1 0  Worry too much - different things 1 0  Trouble relaxing 1 0  Restless 0 0  Easily annoyed or irritable 0 0  Afraid - awful might happen 1 0  Total GAD 7  Score 4 1  Anxiety Difficulty Somewhat difficult Not difficult at all   RA following with rheuma, monthly injection,Tocilizumab, and seeing rheuma q 6 months. She had blood work at her rheumatologist office. Tocilizumab seems to be helping but still having bilateral foot joint pain and lower back pain mainly.  Hyperlipidemia: Currently she is on atorvastatin 20 mg daily. 18 2021: TG 185, HDL 57, TC 152, and LDL 64. 25 OH vitamin D 43.2. Sed rate and CRP in normal range in 09/2020. B12 on 09/09/2020 was normal at 433. Creatinine on 11/23/2020 was 0.9 and EGFR 61.  Hypothyroidism: Currently she is on Euthyrox 112 mcg daily. Negative for dysphagia, palpitations, abdominal pain, changes in bowel habits, tremor, cold/heat intolerance, or abnormal weight loss.  Lab Results  Component Value Date   TSH 1.88 01/30/2020   Review of Systems  Constitutional: Positive for fatigue. Negative for appetite change and fever.  HENT: Negative for mouth sores, nosebleeds and sore throat.   Eyes: Negative for visual disturbance.  Respiratory: Negative for cough, shortness of breath and wheezing.   Cardiovascular: Negative for chest pain and leg swelling.  Gastrointestinal: Negative for abdominal pain, nausea and vomiting.       Negative for changes in bowel habits.  Genitourinary: Negative for decreased urine volume and hematuria.  Musculoskeletal: Positive for arthralgias, back pain and gait problem.  Skin: Negative for pallor and rash.  Neurological: Negative for syncope, weakness and headaches.  Psychiatric/Behavioral: Positive for sleep disturbance. Negative for confusion. The patient is nervous/anxious.   Rest of ROS, see pertinent positives sand negatives in HPI  Current Outpatient Medications on File Prior to Visit  Medication Sig Dispense Refill  . acetaminophen (TYLENOL) 500 MG tablet Take by mouth.    Marland Kitchen atorvastatin (LIPITOR) 20 MG tablet Take 1 tablet (20 mg total) by mouth daily. 90  tablet 3  . Calcium Carb-Cholecalciferol (CALCIUM 600+D3 PO) Take 1 tablet by mouth daily with lunch.    Arna Medici 112 MCG tablet TAKE 1 TABLET BY MOUTH ONCE DAILY BEFORE BREAKFAST 90 tablet 2  . folic acid (FOLVITE) 944 MCG tablet Take 400 mcg by mouth daily.    . hydrOXYzine (ATARAX/VISTARIL) 25 MG tablet Take 1 tablet (25 mg total) by mouth daily as needed. 30 tablet 2  . linaclotide (LINZESS) 145 MCG CAPS capsule Take 1 capsule (145 mcg total) by mouth daily before breakfast. 90 capsule 3  . loratadine (CLARITIN) 10 MG tablet Take 1 tablet (10 mg total) by mouth daily. 30 tablet 11  . Melatonin 10 MG CAPS Take by mouth at bedtime.    . pantoprazole (PROTONIX) 20 MG tablet Take 1 tablet (20 mg total) by mouth daily before breakfast. 90 tablet 3  . Probiotic Product (PROBIOTIC PO) Take 1 capsule by mouth daily.    . temazepam (RESTORIL) 15 MG capsule TAKE 1 CAPSULE BY MOUTH AT BEDTIME AS NEEDED FOR SLEEP 30 capsule 2  . Tocilizumab (ACTEMRA IV) Inject into the vein every 30 (thirty) days.     No current facility-administered medications on file prior to visit.   Past Medical History:  Diagnosis Date  . Anxiety   . Arthritis   . Depression   . Hyperlipidemia   . Rheumatoid arthritis ([place]) 2019  . Temporal arteritis ([place])   . Thyroid disease    Allergies  Allergen Reactions  . Other     SEVERE ALLERGIC REACTIONS (NECK STIFFNESS/HIGH FEVER/ETC) Other reaction(s): Other (See Comments) SEVERE ALLERGIC REACTIONS (NECK STIFFNESS/HIGH FEVER/ETC)  . Tdap [Tetanus-Diphth-Acell Pertussis] Other (See Comments)    SEVERE ALLERGIC REACTIONS (NECK STIFFNESS/HIGH FEVER/ETC)  . Tetanus Toxoid, Adsorbed     Neck stiffness, and high fever Other reaction(s): Other (See Comments) Neck stiffness, and high fever    Social History   Socioeconomic History  . Marital status: Married    Spouse name: Not on file  . Number of children: Not on file  . Years of education: Not on file  . Highest  education level: Not on file  Occupational History  . Not on file  Tobacco Use  . Smoking status: Former Smoker    Packs/day: 1.50    Years: 8.00    Pack years: 12.00    Types: Cigarettes    Quit date: 1978    Years since quitting: 44.3  . Smokeless tobacco: Never Used  . Tobacco comment: smoked infrequently when she was young; quit 80 yo   Vaping Use  . Vaping Use: Never used  Substance and Sexual Activity  . Alcohol use: No  . Drug use: No  . Sexual activity: Yes    Partners: Male    Birth control/protection: Surgical  Other Topics Concern  . Not on file  Social History Narrative  . Not on file   Social Determinants of Health   Financial  Resource Strain: Not on file  Food Insecurity: Not on file  Transportation Needs: Not on file  Physical Activity: Not on file  Stress: Not on file  Social Connections: Not on file   Vitals:   01/24/21 1451  BP: 112/78  Pulse: 76  Resp: 16  Temp: 98.2 F (36.8 C)  SpO2: 96%   Body mass index is 26.24 kg/m.  Physical Exam Vitals and nursing note reviewed.  Constitutional:      General: She is not in acute distress.    Appearance: She is well-developed.  HENT:     Head: Normocephalic and atraumatic.  Eyes:     Conjunctiva/sclera: Conjunctivae normal.     Pupils: Pupils are equal, round, and reactive to light.  Cardiovascular:     Rate and Rhythm: Normal rate and regular rhythm.     Pulses:          Dorsalis pedis pulses are 2+ on the right side and 2+ on the left side.     Heart sounds: No murmur heard.   Pulmonary:     Effort: Pulmonary effort is normal. No respiratory distress.     Breath sounds: Normal breath sounds.  Abdominal:     Palpations: Abdomen is soft. There is no hepatomegaly or mass.     Tenderness: There is no abdominal tenderness.  Lymphadenopathy:     Cervical: No cervical adenopathy.  Skin:    General: Skin is warm.     Findings: No erythema or rash.  Neurological:     Mental Status: She is  alert and oriented to person, place, and time.     Cranial Nerves: No cranial nerve deficit.     Comments: Antalgic gait, not assisted.  Psychiatric:     Comments: Well groomed, good eye contact.   ASSESSMENT AND PLAN:  Jennifer Hull was seen today for 6 months follow-up.  Orders Placed This Encounter  Procedures  . TSH   Lab Results  Component Value Date   TSH 1.01 01/24/2021   Insomnia, unspecified type Temazepam 15 mg at bedtime still helping, she has taken medication for several years and it has been well-tolerated. Adequate sleep hygiene is also recommended. We had discussed some side effects.  Generalized anxiety disorder Problem is stable. Continue Temazepam 15 mg daily prn and Sertraline 100 mg daily.  Hyperlipidemia Problem is adequately controlled. Continue Atorvastatin 20 mg daily and low fat diet.  Atherosclerosis of aorta (H[facility]) On Atorvastatin 20 mg daily. Aspirin not recommended because risk of bleeding.  Major depressive disorder, recurrent episode, moderate ([facility]) Problem is otherwise stable. Continue sertraline 100 mg daily. Monitor for signs of worsening symptoms.  Hypothyroidism Problem has been well controlled. Continue Euthyrox 112 mcg daily. If needed, medication will be adjusted according to TSH.  Peripheral neuropathy Problem does not seem to be well controlled, recently increase gabapentin dose.  So continue gabapentin 300 mg 3 times daily. Appropriate foot care discussed. We discussed some side effects of gabapentin.   Chronic pain disorder Arthralgias and back pain. Meloxicam seems to help. We discussed some side effects of chronic NSAID's use. It seems like Meloxicam 7.5 mg q 2-3 days has been well tolerated. Renal function has been monitored regularly at her rheumatologist's office  Rheumatoid arthritis involving multiple sites, unspecified whether rheumatoid factor present [facility]) Following with rheumatologist.  Spent 45  minutes.  During this time history was obtained and documented, examination was performed, prior labs reviewed, and assessment/plan discussed.  Return in about  5 months (around 06/26/2021) for anxiety,depreswsion,insomnia.  Blia Totman G. [place], MD  [facility]. [place] office.   A few things to remember from today's visit:   Insomnia, unspecified type  Major depressive disorder, recurrent episode, moderate ([place]) - Plan: sertraline (ZOLOFT) 100 MG tablet  Generalized anxiety disorder - Plan: sertraline (ZOLOFT) 100 MG tablet  Rheumatoid arthritis involving multiple sites, unspecified whether rheumatoid factor present ([place]), Chronic  Atherosclerosis of aorta ([place]), Chronic  Peripheral polyneuropathy - Plan: gabapentin (NEURONTIN) 300 MG capsule  Hypothyroidism, unspecified type - Plan: TSH  Hyperlipidemia, unspecified hyperlipidemia type  If you need refills please call your pharmacy. Do not use My Chart to request refills or for acute issues that need immediate attention.   Gabapentin increased to 3 times daily. Can take Meloxican 7.5 mg 3 times per week if no nose bleed.  No changes in the rest of medications. Fall precautions.   Please be sure medication list is accurate. If a new problem present, please set up appointment sooner than planned today.

## 2021-01-24 NOTE — Assessment & Plan Note (Signed)
On Atorvastatin 20 mg daily. Aspirin not recommended because risk of bleeding.

## 2021-01-24 NOTE — Assessment & Plan Note (Signed)
Problem is otherwise stable. Continue sertraline 100 mg daily. Monitor for signs of worsening symptoms.

## 2021-01-24 NOTE — Patient Instructions (Signed)
A few things to remember from today's visit:   Insomnia, unspecified type  Major depressive disorder, recurrent episode, moderate ([place]) - Plan: sertraline (ZOLOFT) 100 MG tablet  Generalized anxiety disorder - Plan: sertraline (ZOLOFT) 100 MG tablet  Rheumatoid arthritis involving multiple sites, unspecified whether rheumatoid factor present ([place]), Chronic  Atherosclerosis of aorta ([place]), Chronic  Peripheral polyneuropathy - Plan: gabapentin (NEURONTIN) 300 MG capsule  Hypothyroidism, unspecified type - Plan: TSH  Hyperlipidemia, unspecified hyperlipidemia type  If you need refills please call your pharmacy. Do not use My Chart to request refills or for acute issues that need immediate attention.   Gabapentin increased to 3 times daily. Can take Meloxican 7.5 mg 3 times per week if no nose bleed.  No changes in the rest of medications. Fall precautions.   Please be sure medication list is accurate. If a new problem present, please set up appointment sooner than planned today.

## 2021-01-24 NOTE — Assessment & Plan Note (Signed)
Arthralgias and back pain. Meloxicam seems to help. We discussed some side effects of chronic NSAID's use. It seems like Meloxicam 7.5 mg q 2-3 days has been well tolerated. Renal function has been monitored regularly at her rheumatologist's office

## 2021-01-24 NOTE — Assessment & Plan Note (Signed)
Problem does not seem to be well controlled, recently increase gabapentin dose.  So continue gabapentin 300 mg 3 times daily. Appropriate foot care discussed. We discussed some side effects of gabapentin.

## 2021-01-24 NOTE — Assessment & Plan Note (Signed)
Problem has been well controlled. Continue Euthyrox 112 mcg daily. If needed, medication will be adjusted according to TSH.

## 2021-01-25 ENCOUNTER — Other Ambulatory Visit: Payer: Self-pay | Admitting: Family Medicine

## 2021-01-25 DIAGNOSIS — L270 Generalized skin eruption due to drugs and medicaments taken internally: Secondary | ICD-10-CM

## 2021-01-25 LAB — TSH: TSH: 1.01 u[IU]/mL (ref 0.35–4.50)

## 2021-02-09 ENCOUNTER — Telehealth: Payer: Self-pay | Admitting: Family Medicine

## 2021-02-09 NOTE — Telephone Encounter (Signed)
Left message for patient to call back and schedule Medicare Annual Wellness Visit (AWV) either virtually or in office.   Last AWV 01/30/20  please schedule at anytime with [facility] Nurse Health Advisor 1 or 2   This should be a 45 minute visit.

## 2021-02-16 ENCOUNTER — Other Ambulatory Visit: Payer: Self-pay | Admitting: Family Medicine

## 2021-02-16 DIAGNOSIS — L270 Generalized skin eruption due to drugs and medicaments taken internally: Secondary | ICD-10-CM

## 2021-02-21 DIAGNOSIS — Z111 Encounter for screening for respiratory tuberculosis: Secondary | ICD-10-CM | POA: Diagnosis not present

## 2021-02-21 DIAGNOSIS — M0579 Rheumatoid arthritis with rheumatoid factor of multiple sites without organ or systems involvement: Secondary | ICD-10-CM | POA: Diagnosis not present

## 2021-02-21 DIAGNOSIS — R5383 Other fatigue: Secondary | ICD-10-CM | POA: Diagnosis not present

## 2021-02-21 DIAGNOSIS — Z79899 Other long term (current) drug therapy: Secondary | ICD-10-CM | POA: Diagnosis not present

## 2021-02-22 ENCOUNTER — Other Ambulatory Visit: Payer: Self-pay

## 2021-02-22 DIAGNOSIS — K219 Gastro-esophageal reflux disease without esophagitis: Secondary | ICD-10-CM

## 2021-02-22 MED ORDER — PANTOPRAZOLE SODIUM 20 MG PO TBEC: 20.0000 mg | DELAYED_RELEASE_TABLET | Freq: Every day | ORAL | 3 refills | Status: DC

## 2021-02-23 ENCOUNTER — Other Ambulatory Visit: Payer: Self-pay

## 2021-02-23 MED ORDER — PANTOPRAZOLE SODIUM 40 MG PO TBEC: 40.0000 mg | DELAYED_RELEASE_TABLET | Freq: Every day | ORAL | 3 refills | Status: DC

## 2021-03-11 ENCOUNTER — Ambulatory Visit: Payer: Medicare Other

## 2021-03-15 ENCOUNTER — Ambulatory Visit
Admission: RE | Admit: 2021-03-15 | Discharge: 2021-03-15 | Disposition: A | Discharge status: Home / Self Care | Payer: Medicare Other | Source: Ambulatory Visit | Attending: Emergency Medicine | Admitting: Emergency Medicine

## 2021-03-15 DIAGNOSIS — R9389 Abnormal findings on diagnostic imaging of other specified body structures: Secondary | ICD-10-CM

## 2021-03-15 DIAGNOSIS — R918 Other nonspecific abnormal finding of lung field: Secondary | ICD-10-CM | POA: Diagnosis not present

## 2021-03-18 ENCOUNTER — Ambulatory Visit: Payer: Medicare Other | Admitting: Emergency Medicine

## 2021-03-19 ENCOUNTER — Other Ambulatory Visit: Payer: Self-pay | Admitting: Family Medicine

## 2021-03-19 DIAGNOSIS — F331 Major depressive disorder, recurrent, moderate: Secondary | ICD-10-CM

## 2021-03-19 DIAGNOSIS — F411 Generalized anxiety disorder: Secondary | ICD-10-CM

## 2021-03-21 ENCOUNTER — Other Ambulatory Visit: Payer: Self-pay | Admitting: Family Medicine

## 2021-03-21 DIAGNOSIS — F331 Major depressive disorder, recurrent, moderate: Secondary | ICD-10-CM

## 2021-03-21 DIAGNOSIS — F411 Generalized anxiety disorder: Secondary | ICD-10-CM

## 2021-03-22 DIAGNOSIS — M0579 Rheumatoid arthritis with rheumatoid factor of multiple sites without organ or systems involvement: Secondary | ICD-10-CM | POA: Diagnosis not present

## 2021-03-23 DIAGNOSIS — Z7952 Long term (current) use of systemic steroids: Secondary | ICD-10-CM | POA: Diagnosis not present

## 2021-03-23 DIAGNOSIS — R7309 Other abnormal glucose: Secondary | ICD-10-CM | POA: Diagnosis not present

## 2021-03-23 LAB — BASIC METABOLIC PANEL
BUN: 16 (ref 4–21)
CO2: 20 (ref 13–22)
Chloride: 105 (ref 99–108)
Creatinine: 0.8 (ref 0.5–1.1)
Glucose: 76
Potassium: 4.3 (ref 3.4–5.3)
Sodium: 144 (ref 137–147)

## 2021-03-23 LAB — HEMOGLOBIN A1C: Hemoglobin A1C: 5.4

## 2021-03-23 LAB — COMPREHENSIVE METABOLIC PANEL: Calcium: 9.7 (ref 8.7–10.7)

## 2021-03-29 ENCOUNTER — Encounter: Payer: Self-pay | Admitting: Family Medicine

## 2021-03-30 ENCOUNTER — Encounter: Payer: Self-pay | Admitting: Family Medicine

## 2021-03-30 ENCOUNTER — Other Ambulatory Visit: Payer: Self-pay

## 2021-03-30 ENCOUNTER — Ambulatory Visit (INDEPENDENT_AMBULATORY_CARE_PROVIDER_SITE_OTHER): Payer: Medicare Other | Admitting: Family Medicine

## 2021-03-30 VITALS — BP 120/60 | HR 72 | Resp 16 | Ht 60.0 in | Wt 135.1 lb

## 2021-03-30 DIAGNOSIS — F331 Major depressive disorder, recurrent, moderate: Secondary | ICD-10-CM | POA: Diagnosis not present

## 2021-03-30 DIAGNOSIS — F411 Generalized anxiety disorder: Secondary | ICD-10-CM

## 2021-03-30 DIAGNOSIS — Z780 Asymptomatic menopausal state: Secondary | ICD-10-CM

## 2021-03-30 DIAGNOSIS — Z0000 Encounter for general adult medical examination without abnormal findings: Secondary | ICD-10-CM | POA: Diagnosis not present

## 2021-03-30 NOTE — Patient Instructions (Addendum)
[name] , Thank you for taking time to come for your Medicare Wellness Visit. I appreciate your ongoing commitment to your health goals. Please review the following plan we discussed and let me know if I can assist you in the future.   These are the goals we discussed:  Goals      Patient Stated     Low impact physical activity. Tai Chi. Pool exercise. Fall prevention.      Prevent falls     Continue Vit D and calcium supplementation. DEXA will be scheduled.           This is a list of the screening recommended for you and due dates:  Health Maintenance  Topic Date Due   Hepatitis C Screening: USPSTF Recommendation to screen - Ages 18-79 yo.  Never done   Zoster (Shingles) Vaccine (1 of 2) Never done   COVID-19 Vaccine (3 - Pfizer risk series) 02/06/2020   DEXA scan (bone density measurement)  Completed   Pneumonia vaccines  Completed   HPV Vaccine  Aged Out   Flu Shot  Discontinued   A few things to remember from today's visit:  Medicare annual wellness visit, subsequent  Encounter for HCV screening test for low risk patient  Asymptomatic postmenopausal estrogen deficiency - Plan: DG Bone Density  If you need refills please call your pharmacy. Do not use My Chart to request refills or for acute issues that need immediate attention.    Please be sure medication list is accurate. If a new problem present, please set up appointment sooner than planned today.         

## 2021-03-30 NOTE — Progress Notes (Signed)
HPI: Jennifer Hull is a 80 y.o. female, who is here today for AWV months follow up.   Last AWV 01/29/21. No new problems since her last visit on 01/24/21.  She lives with her husband.. Independent for most ADL's and IADL's. She does not drive, her husband does. She has to use her cane sometimes.  No falls in the past year and denies depression symptoms. Chronic pain, OA,RA,and peripheral neuropathy increase her risk for falls.  Functional Status Survey: Is the patient deaf or have difficulty hearing?: No Does the patient have difficulty seeing, even when wearing glasses/contacts?: No Does the patient have difficulty concentrating, remembering, or making decisions?: No Does the patient have difficulty walking or climbing stairs?: Yes Does the patient have difficulty dressing or bathing?: No Does the patient have difficulty doing errands alone such as visiting a doctor's office or shopping?: No  Fall Risk  01/25/2021 01/30/2020 01/30/2020 01/01/2019 12/25/2017  Falls in the past year? 0 0 0 0 Yes  Number falls in past yr: 0 0 - 0 2 or more  Injury with Fall? 0 0 0 1 Yes  Comment - - - - does not know why  Risk for fall due to : - - - Orthopedic patient (No Data)  Risk for fall due to: Comment - - - - fell in around the bathroom in Oct  Follow up Education provided Education provided - - Education provided   Providers she sees regularly: Eye care provider: Dr Imelda Pillow, annual visits, next appt 05/2020. Rheumatologist, RA and OA, follows with Dr. Murvin Natal, followed last week and next appt in 06/2021. She sees orthopedist prn. Podiatrist for left foot pain mainly. Pain management: Dr Soundra Pilon, for chronic back pain.She has not seen him in over a year.  Pulmonologist: Dr Delton Coombes.  Depression: She is taking Sertraline 100 mg and sometimes increase it to 125 mg, when she feels "down." Anxiety: She is on Temazepam 15 mg daily prn.  Depression screen [facility] 2/9 03/30/2021  Decreased Interest 0   Down, Depressed, Hopeless 0  PHQ - 2 Score 0  Altered sleeping -  Tired, decreased energy -  Change in appetite -  Feeling bad or failure about yourself  -  Trouble concentrating -  Moving slowly or fidgety/restless -  Suicidal thoughts -  PHQ-9 Score -  Difficult doing work/chores -   Vision Screening - Comments:: Pt had eyes checked earlier this year.    Mini-Cog - 03/30/21 1441     Normal clock drawing test? yes    How many words correct? 3            Osteoporosis, she took Fosamax for 5-7 years at least.  HLD: She is on Atorvastatin 20 mg daily. Aortic atherosclerosis.  No hx of HTN.  Lab Results  Component Value Date   CHOL 160 01/30/2020   HDL 57.60 01/30/2020   LDLCALC 116 (H) 02/24/2019   LDLDIRECT 67.0 01/30/2020   TRIG 243.0 (H) 01/30/2020   CHOLHDL 3 01/30/2020   No hx of diabetes. Glucose 150. HgA1C done at her rheumatologist's office.  Lab Results  Component Value Date   HGBA1C 5.4 03/23/2021   Cough improved with OTC intranasal steroid and Loratadine. Has a f/u appt with pulmonologist.  Review of Systems  Constitutional:  Positive for fatigue. Negative for activity change, appetite change, chills and fever.  HENT:  Negative for mouth sores, nosebleeds and sore throat.   Respiratory:  Negative for cough, shortness of breath and  wheezing.   Cardiovascular:  Negative for chest pain and palpitations.  Gastrointestinal:  Negative for abdominal pain, nausea and vomiting.       Negative for changes in bowel habits.  Genitourinary:  Negative for decreased urine volume and hematuria.  Musculoskeletal:  Positive for arthralgias and gait problem.  Skin:  Negative for pallor and rash.  Allergic/Immunologic: Positive for environmental allergies.  Neurological:  Negative for syncope, weakness and headaches.  Psychiatric/Behavioral:  Negative for confusion. The patient is nervous/anxious.    Current Outpatient Medications on File Prior to Visit   Medication Sig Dispense Refill   acetaminophen (TYLENOL) 500 MG tablet Take by mouth.     atorvastatin (LIPITOR) 20 MG tablet Take 1 tablet (20 mg total) by mouth daily. 90 tablet 3   Calcium Carb-Cholecalciferol (CALCIUM 600+D3 PO) Take 1 tablet by mouth daily with lunch.     EUTHYROX 112 MCG tablet TAKE 1 TABLET BY MOUTH ONCE DAILY BEFORE BREAKFAST 90 tablet 2   folic acid (FOLVITE) 397 MCG tablet Take 400 mcg by mouth daily.     gabapentin (NEURONTIN) 300 MG capsule Take 1 capsule (300 mg total) by mouth 3 (three) times daily. 90 capsule 3   hydrOXYzine (ATARAX/VISTARIL) 25 MG tablet Take 1 tablet (25 mg total) by mouth daily as needed. 30 tablet 2   linaclotide (LINZESS) 145 MCG CAPS capsule Take 1 capsule (145 mcg total) by mouth daily before breakfast. 90 capsule 3   loratadine (CLARITIN) 10 MG tablet Take 1 tablet (10 mg total) by mouth daily. 30 tablet 11   Melatonin 10 MG CAPS Take by mouth at bedtime.     pantoprazole (PROTONIX) 40 MG tablet Take 1 tablet (40 mg total) by mouth daily. 90 tablet 3   Probiotic Product (PROBIOTIC PO) Take 1 capsule by mouth daily.     sertraline (ZOLOFT) 100 MG tablet TAKE 1 & 1/2 (ONE & ONE-HALF) TABLETS BY MOUTH ONCE DAILY 135 tablet 0   temazepam (RESTORIL) 15 MG capsule TAKE 1 CAPSULE BY MOUTH AT BEDTIME AS NEEDED FOR SLEEP 30 capsule 2   Tocilizumab (ACTEMRA IV) Inject into the vein every 30 (thirty) days.     No current facility-administered medications on file prior to visit.   Past Medical History:  Diagnosis Date   Anxiety    Arthritis    Depression    Hyperlipidemia    Rheumatoid arthritis ([place]) 2019   Temporal arteritis (HCC)    Thyroid disease    Allergies  Allergen Reactions   Other     SEVERE ALLERGIC REACTIONS (NECK STIFFNESS/HIGH FEVER/ETC) Other reaction(s): Other (See Comments) SEVERE ALLERGIC REACTIONS (NECK STIFFNESS/HIGH FEVER/ETC)   Tdap [Tetanus-Diphth-Acell Pertussis] Other (See Comments)    SEVERE ALLERGIC  REACTIONS (NECK STIFFNESS/HIGH FEVER/ETC)   Tetanus Toxoid, Adsorbed     Neck stiffness, and high fever Other reaction(s): Other (See Comments) Neck stiffness, and high fever    Social History   Socioeconomic History   Marital status: Married    Spouse name: Not on file   Number of children: Not on file   Years of education: Not on file   Highest education level: Not on file  Occupational History   Not on file  Tobacco Use   Smoking status: Former    Packs/day: 1.50    Years: 8.00    Pack years: 12.00    Types: Cigarettes    Quit date: 69    Years since quitting: 44.5   Smokeless tobacco: Never   Tobacco  comments:    smoked infrequently when she was young; quit 80 yo   Vaping Use   Vaping Use: Never used  Substance and Sexual Activity   Alcohol use: No   Drug use: No   Sexual activity: Yes    Partners: Male    Birth control/protection: Surgical  Other Topics Concern   Not on file  Social History Narrative   Not on file   Social Determinants of Health   Financial Resource Strain: Not on file  Food Insecurity: Not on file  Transportation Needs: Not on file  Physical Activity: Not on file  Stress: Not on file  Social Connections: Not on file   Vitals:   03/30/21 1416  BP: 120/60  Pulse: 72  Resp: 16  SpO2: 98%   Body mass index is 26.39 kg/m.  Physical Exam Vitals and nursing note reviewed.  Constitutional:      General: She is not in acute distress.    Appearance: She is well-developed.  HENT:     Head: Normocephalic and atraumatic.     Mouth/Throat:     Mouth: Mucous membranes are moist.  Eyes:     Conjunctiva/sclera: Conjunctivae normal.  Cardiovascular:     Rate and Rhythm: Normal rate and regular rhythm.     Heart sounds: No murmur heard. Pulmonary:     Effort: Pulmonary effort is normal. No respiratory distress.     Breath sounds: Normal breath sounds.  Abdominal:     Palpations: Abdomen is soft. There is no hepatomegaly or mass.      Tenderness: There is no abdominal tenderness.  Musculoskeletal:     Comments: Antalgic gait.  Skin:    General: Skin is warm.     Findings: No erythema or rash.  Neurological:     General: No focal deficit present.     Mental Status: She is alert and oriented to person, place, and time.     Cranial Nerves: No cranial nerve deficit.     Comments: Mildly unstable gait, not assisted.  Psychiatric:     Comments: Well groomed, good eye contact.   ASSESSMENT AND PLAN:  Jennifer Hull was seen today for AWV.  Orders Placed This Encounter  Procedures   DG Bone Density   Medicare annual wellness visit, subsequent We discussed the importance of staying active, physically and mentally, as well as the benefits of a healthy/balance diet. Low impact exercise that involve stretching and strengthing are ideal. Vaccines: Declined. We discussed preventive screening for the next 5-10 years, summery of recommendations given in AVS. DEXA order placed. Fall prevention. Advance directives and end of life discussed, she has POA and living will.   Asymptomatic postmenopausal estrogen deficiency -     DG Bone Density; Future  Generalized anxiety disorder Continue Sertraline 125 mg daily and Temazepam 15 mg daily prn.  Major depressive disorder, recurrent episode, moderate (HCC) Recommend not trying to decrease sertraline dose again, she has not tolerated well. So continue Sertraline 125 mg daily,   Return for keep next appt.   Colyn Miron G. Martinique, MD  [facility]. [place] office.  Ms. Demars , Thank you for taking time to come for your Medicare Wellness Visit. I appreciate your ongoing commitment to your health goals. Please review the following plan we discussed and let me know if I can assist you in the future.   These are the goals we discussed:  Goals      Patient Stated  Low impact physical activity. Tai Chi. Pool exercise. Fall prevention.      Prevent falls      Continue Vit D and calcium supplementation. DEXA will be scheduled.           This is a list of the screening recommended for you and due dates:  Health Maintenance  Topic Date Due   Hepatitis C Screening: USPSTF Recommendation to screen - Ages 59-79 yo.  Never done   Zoster (Shingles) Vaccine (1 of 2) Never done   COVID-19 Vaccine (3 - Pfizer risk series) 02/06/2020   DEXA scan (bone density measurement)  Completed   Pneumonia vaccines  Completed   HPV Vaccine  Aged Out   Flu Shot  Discontinued   A few things to remember from today's visit:  Medicare annual wellness visit, subsequent  Asymptomatic postmenopausal estrogen deficiency - Plan: DG Bone Density  If you need refills please call your pharmacy. Do not use My Chart to request refills or for acute issues that need immediate attention.    Please be sure medication list is accurate. If a new problem present, please set up appointment sooner than planned today.

## 2021-04-05 ENCOUNTER — Ambulatory Visit: Payer: Medicare Other | Admitting: Emergency Medicine

## 2021-04-15 ENCOUNTER — Other Ambulatory Visit: Payer: Self-pay | Admitting: Family Medicine

## 2021-04-15 DIAGNOSIS — E785 Hyperlipidemia, unspecified: Secondary | ICD-10-CM

## 2021-04-21 DIAGNOSIS — M25561 Pain in right knee: Secondary | ICD-10-CM | POA: Diagnosis not present

## 2021-05-02 DIAGNOSIS — M0579 Rheumatoid arthritis with rheumatoid factor of multiple sites without organ or systems involvement: Secondary | ICD-10-CM | POA: Diagnosis not present

## 2021-05-09 DIAGNOSIS — M316 Other giant cell arteritis: Secondary | ICD-10-CM | POA: Diagnosis not present

## 2021-05-09 DIAGNOSIS — H1045 Other chronic allergic conjunctivitis: Secondary | ICD-10-CM | POA: Diagnosis not present

## 2021-05-09 DIAGNOSIS — Z961 Presence of intraocular lens: Secondary | ICD-10-CM | POA: Diagnosis not present

## 2021-05-09 DIAGNOSIS — H04123 Dry eye syndrome of bilateral lacrimal glands: Secondary | ICD-10-CM | POA: Diagnosis not present

## 2021-05-09 DIAGNOSIS — M069 Rheumatoid arthritis, unspecified: Secondary | ICD-10-CM | POA: Diagnosis not present

## 2021-05-13 ENCOUNTER — Other Ambulatory Visit: Payer: Self-pay

## 2021-05-13 ENCOUNTER — Encounter: Payer: Self-pay | Admitting: Emergency Medicine

## 2021-05-13 ENCOUNTER — Ambulatory Visit (INDEPENDENT_AMBULATORY_CARE_PROVIDER_SITE_OTHER): Payer: Medicare Other | Admitting: Emergency Medicine

## 2021-05-13 DIAGNOSIS — R918 Other nonspecific abnormal finding of lung field: Secondary | ICD-10-CM

## 2021-05-13 DIAGNOSIS — R053 Chronic cough: Secondary | ICD-10-CM | POA: Diagnosis not present

## 2021-05-13 DIAGNOSIS — K219 Gastro-esophageal reflux disease without esophagitis: Secondary | ICD-10-CM | POA: Diagnosis not present

## 2021-05-13 DIAGNOSIS — J310 Chronic rhinitis: Secondary | ICD-10-CM | POA: Diagnosis not present

## 2021-05-13 NOTE — Assessment & Plan Note (Signed)
Continue pantoprazole as she is taking it

## 2021-05-13 NOTE — Assessment & Plan Note (Signed)
Continue fluticasone nasal spray, loratadine, nasal saline as she is taking them.

## 2021-05-13 NOTE — Patient Instructions (Signed)
We will plan to repeat your CT scan of the chest in June 2023 to follow for stability. Please continue your pantoprazole every day as you have been taking it. Please continue your loratadine 10 mg once daily. Please continue fluticasone nasal spray, 2 sprays each nostril once daily. Please continue nasal saline rinses as needed Follow with Dr. Lamonte Sakai in June 2023 after your CT scan or sooner if you have any change in symptoms, increased cough, difficulty with breathing.

## 2021-05-13 NOTE — Assessment & Plan Note (Signed)
Small micronodular disease is stable over the interval based on CT from June 2022.  No clear evidence for active opportunistic infection, progression of pneumonitis or bronchiolitis.  She is clinically improved.  We will plan to follow a repeat film for stability in 1 year.

## 2021-05-13 NOTE — Progress Notes (Signed)
Subjective:    Patient ID: Jennifer Hull, female    DOB: January 31, 1941, 80 y.o.   MRN: [id number]  HPI 80 year old woman, minimal tobacco history (12 pack years, quit 1978), with a history of rheumatoid arthritis on Tocilizumab, temporal arteritis, hypothyroidism, depression, chronic cough. She had COVID 19 in late 2021  She began to experience cough that has had offs/ons for the last 2-3 years. Cough has been dry, can happen at any time during the day. She has had periods when it was quiet - can't remember if this seasonal. Cough was very active in December-January when she had COVID, is now improved.   She has a lot of congestion, nasal drainage, clear. She uses saline spray, uses very morning for congestion. She has reflux, is on pantoprazole but still some breakthrough. Sometimes takes her PPI 2x a day. Cough does not wake her from sleep.   She underwent CT scan of the chest on 09/14/2020 to evaluate her cough which I have reviewed, shows no mediastinal lymphadenopathy, numerous subtle tiny centrilobular nodules that are apical predominant of unclear etiology.  No evidence of interstitial disease.   R OV 05/13/2021  -- follow-up visit 80 year old woman with a history of rheumatoid arthritis on tocilizumab.  Also temporal arteritis, chronic cough.  She has minimal tobacco history had COVID-19 in late 2021.  I saw her in February for chronic coughing and for an abnormal CT scan of the chest with subtle micronodular disease.  Question due to her RA versus opportunistic infection.  We repeated her CT as below. I started her on loratadine and fluticasone nasal spray, saline nasal spray.  Continued pantoprazole..  Today she reports that she is feeling well. Her cough is resolved.    CT scan of the chest done on 03/15/2021 reviewed by me shows stable tiny scattered centrilobular nodules.  No new nodules or concerning findings.  Stable small prominent mediastinal hilar nodes that do not reach pathological  size.   Review of Systems As per HPI  Past Medical History:  Diagnosis Date   Anxiety    Arthritis    Depression    Hyperlipidemia    Rheumatoid arthritis ([place]) 2019   Temporal arteritis ([place])    Thyroid disease      Family History  Problem Relation Age of Onset   Kidney cancer Mother    Heart disease Father    Breast cancer Maternal Aunt    Colon cancer Neg Hx    Esophageal cancer Neg Hx    Rectal cancer Neg Hx    Stomach cancer Neg Hx      Social History   Socioeconomic History   Marital status: Married    Spouse name: Not on file   Number of children: Not on file   Years of education: Not on file   Highest education level: Not on file  Occupational History   Not on file  Tobacco Use   Smoking status: Former    Packs/day: 1.50    Years: 8.00    Pack years: 12.00    Types: Cigarettes    Quit date: 83    Years since quitting: 44.6   Smokeless tobacco: Never   Tobacco comments:    smoked infrequently when she was young; quit 80 yo   Vaping Use   Vaping Use: Never used  Substance and Sexual Activity   Alcohol use: No   Drug use: No   Sexual activity: Yes    Partners: Male  Birth control/protection: Surgical  Other Topics Concern   Not on file  Social History Narrative   Not on file   Social Determinants of Health   Financial Resource Strain: Not on file  Food Insecurity: Not on file  Transportation Needs: Not on file  Physical Activity: Not on file  Stress: Not on file  Social Connections: Not on file  Intimate Partner Violence: Not on file    From [place], has also lived in [place], [place], [place] No known inhaled exposures.   Allergies  Allergen Reactions   Other     SEVERE ALLERGIC REACTIONS (NECK STIFFNESS/HIGH FEVER/ETC) Other reaction(s): Other (See Comments) SEVERE ALLERGIC REACTIONS (NECK STIFFNESS/HIGH FEVER/ETC)   Tdap [Tetanus-Diphth-Acell Pertussis] Other (See Comments)    SEVERE ALLERGIC REACTIONS (NECK STIFFNESS/HIGH FEVER/ETC)    Tetanus Toxoid, Adsorbed     Neck stiffness, and high fever Other reaction(s): Other (See Comments) Neck stiffness, and high fever   Covid-19 ([place]) Vaccine Therapist, music) [Covid-19 ([place]na) Vaccine] Rash     Outpatient Medications Prior to Visit  Medication Sig Dispense Refill   acetaminophen (TYLENOL) 500 MG tablet Take by mouth.     atorvastatin (LIPITOR) 20 MG tablet Take 1 tablet by mouth once daily 90 tablet 3   Calcium Carb-Cholecalciferol (CALCIUM 600+D3 PO) Take 1 tablet by mouth daily with lunch.     EUTHYROX 112 MCG tablet TAKE 1 TABLET BY MOUTH ONCE DAILY BEFORE BREAKFAST 90 tablet 2   folic acid (FOLVITE) [id number] MCG tablet Take 400 mcg by mouth daily.     gabapentin (NEURONTIN) 300 MG capsule Take 1 capsule (300 mg total) by mouth 3 (three) times daily. 90 capsule 3   hydrOXYzine (ATARAX/VISTARIL) 25 MG tablet Take 1 tablet (25 mg total) by mouth daily as needed. 30 tablet 2   linaclotide (LINZESS) 145 MCG CAPS capsule Take 1 capsule (145 mcg total) by mouth daily before breakfast. 90 capsule 3   loratadine (CLARITIN) 10 MG tablet Take 1 tablet (10 mg total) by mouth daily. 30 tablet 11   Melatonin 10 MG CAPS Take by mouth at bedtime.     meloxicam (MOBIC) 7.5 MG tablet Take 7.5 mg by mouth as needed for pain.     pantoprazole (PROTONIX) 40 MG tablet Take 1 tablet (40 mg total) by mouth daily. 90 tablet 3   Probiotic Product (PROBIOTIC PO) Take 1 capsule by mouth daily.     sertraline (ZOLOFT) 100 MG tablet TAKE 1 & 1/2 (ONE & ONE-HALF) TABLETS BY MOUTH ONCE DAILY 135 tablet 0   temazepam (RESTORIL) 15 MG capsule TAKE 1 CAPSULE BY MOUTH AT BEDTIME AS NEEDED FOR SLEEP 30 capsule 2   Tocilizumab (ACTEMRA IV) Inject into the vein every 30 (thirty) days.     No facility-administered medications prior to visit.        Objective:   Physical Exam Vitals:   05/13/21 1342  BP: 112/68  Pulse: (!) 56  Temp: 98.1 F (36.7 C)  TempSrc: Oral  SpO2: 98%  Weight: 131 lb (59.4 kg)  Height:  5' (1.524 m)   Gen: Pleasant, well-nourished, in no distress,  normal affect  ENT: No lesions,  mouth clear,  oropharynx clear, no postnasal drip  Neck: No JVD, no stridor  Lungs: No use of accessory muscles, no crackles or wheezing on normal respiration, no wheeze on forced expiration  Cardiovascular: RRR, heart sounds normal, no murmur or gallops, no peripheral edema  Musculoskeletal: No deformities, no cyanosis or clubbing  Neuro: alert, awake, non  focal  Skin: Warm, no lesions or rash     Assessment & Plan:  Pulmonary nodules Small micronodular disease is stable over the interval based on CT from June 2022.  No clear evidence for active opportunistic infection, progression of pneumonitis or bronchiolitis.  She is clinically improved.  We will plan to follow a repeat film for stability in 1 year.  Chronic cough Improved when we added a better rhinitis medication regimen to her GERD regimen.  Plan to continue both.  GERD (gastroesophageal reflux disease) Continue pantoprazole as she is taking it  Chronic rhinitis Continue fluticasone nasal spray, loratadine, nasal saline as she is taking them.  Baltazar Apo, MD, PhD 05/13/2021, 1:58 PM [facility] [phone] or if no answer before 7:00PM call [phone] For any issues after 7:00PM please call eLink [phone]

## 2021-05-13 NOTE — Assessment & Plan Note (Signed)
Improved when we added a better rhinitis medication regimen to her GERD regimen.  Plan to continue both.

## 2021-05-20 ENCOUNTER — Other Ambulatory Visit: Payer: Self-pay | Admitting: Family Medicine

## 2021-05-20 DIAGNOSIS — Z1231 Encounter for screening mammogram for malignant neoplasm of breast: Secondary | ICD-10-CM

## 2021-05-25 DIAGNOSIS — M19012 Primary osteoarthritis, left shoulder: Secondary | ICD-10-CM | POA: Diagnosis not present

## 2021-05-26 ENCOUNTER — Other Ambulatory Visit: Payer: Self-pay | Admitting: Orthopedic Surgery

## 2021-05-26 DIAGNOSIS — M5136 Other intervertebral disc degeneration, lumbar region: Secondary | ICD-10-CM | POA: Diagnosis not present

## 2021-05-26 DIAGNOSIS — Z6822 Body mass index (BMI) 22.0-22.9, adult: Secondary | ICD-10-CM | POA: Diagnosis not present

## 2021-05-26 DIAGNOSIS — M25561 Pain in right knee: Secondary | ICD-10-CM | POA: Diagnosis not present

## 2021-05-26 DIAGNOSIS — M2550 Pain in unspecified joint: Secondary | ICD-10-CM | POA: Diagnosis not present

## 2021-05-26 DIAGNOSIS — M25512 Pain in left shoulder: Secondary | ICD-10-CM | POA: Diagnosis not present

## 2021-05-26 DIAGNOSIS — G8929 Other chronic pain: Secondary | ICD-10-CM | POA: Diagnosis not present

## 2021-05-26 DIAGNOSIS — M25511 Pain in right shoulder: Secondary | ICD-10-CM | POA: Diagnosis not present

## 2021-05-26 DIAGNOSIS — M0589 Other rheumatoid arthritis with rheumatoid factor of multiple sites: Secondary | ICD-10-CM | POA: Diagnosis not present

## 2021-05-26 DIAGNOSIS — Z01811 Encounter for preprocedural respiratory examination: Secondary | ICD-10-CM

## 2021-05-26 DIAGNOSIS — M316 Other giant cell arteritis: Secondary | ICD-10-CM | POA: Diagnosis not present

## 2021-05-26 DIAGNOSIS — M8589 Other specified disorders of bone density and structure, multiple sites: Secondary | ICD-10-CM | POA: Diagnosis not present

## 2021-05-27 NOTE — Progress Notes (Signed)
Please enter orders for PAT visit scheduled for 06-02-21.

## 2021-05-31 NOTE — Progress Notes (Addendum)
COVID swab appointment: 06/07/21  COVID Vaccine Completed: Yes x2 Date COVID Vaccine completed: 12/17/19, 01/09/20 Has received booster: COVID vaccine manufacturer: Pfizer      Date of COVID positive in last 90 days: No  PCP - [name], MD Cardiologist - N/a  Chest x-ray - CT chest 03/15/21 Epic EKG - 06/02/21 Epic Stress Test - N/a ECHO - 01/11/17 Epic Cardiac Cath - N/a Pacemaker/ICD device last checked N/a Spinal Cord Stimulator: N/a  Sleep Study - N/a CPAP -   Fasting Blood Sugar - N/a Checks Blood Sugar _____ times a day  Blood Thinner Instructions: N/a Aspirin Instructions: Last Dose:  Activity level: Can go up a flight of stairs and perform activities of daily living without stopping and without symptoms of chest pain or shortness of breath.       Anesthesia review:   Patient denies shortness of breath, fever, cough and chest pain at PAT appointment   Patient verbalized understanding of instructions that were given to them at the PAT appointment. Patient was also instructed that they will need to review over the PAT instructions again at home before surgery.

## 2021-06-01 NOTE — Patient Instructions (Addendum)
DUE TO COVID-19 ONLY ONE VISITOR IS ALLOWED TO COME WITH YOU AND STAY IN THE WAITING ROOM ONLY DURING PRE OP AND PROCEDURE.   **NO VISITORS ARE ALLOWED IN THE SHORT STAY AREA OR RECOVERY ROOM!!**  IF YOU WILL BE ADMITTED INTO THE HOSPITAL YOU ARE ALLOWED ONLY TWO SUPPORT PEOPLE DURING VISITATION HOURS ONLY (10AM -8PM)   The support person(s) may change daily. The support person(s) must pass our screening, gel in and out, and wear a mask at all times, including in the patient's room. Patients must also wear a mask when staff or their support person are in the room.  No visitors under the age of 34. Any visitor under the age of 29 must be accompanied by an adult.    COVID SWAB TESTING MUST BE COMPLETED ON:  06/07/21 **MUST PRESENT COMPLETED FORM AT TESTING SITE**    [facility] (backside of the building) No appointment needed. Open 8am-3pm. You are not required to quarantine, however you are required to wear a well-fitted mask when you are out and around people not in your household.  Hand Hygiene often Do NOT share personal items Notify your provider if you are in close contact with someone who has COVID or you develop fever 100.4 or greater, new onset of sneezing, cough, sore throat, shortness of breath or body aches.       Your procedure is scheduled on: 06/09/21   Report to [facility] Main  Entrance   Report to Short Stay at 5:15 AM   [facility])   Call this number if you have problems the morning of surgery [phone]   Do not eat food :After Midnight.   May have liquids until 4:30 AM day of surgery  CLEAR LIQUID DIET  Foods Allowed                                                                     Foods Excluded  Water, Black Coffee and tea (No milk or creamer)           liquids that you cannot  Plain Jell-O in any flavor  (No red)                                     see through such as: Fruit ices (not with fruit pulp)                                              milk, soups, orange juice              Iced Popsicles (No red)                                                All solid food  Apple juices Sports drinks like Gatorade (No red) Lightly seasoned clear broth or consume(fat free) Sugar, honey syrup      The day of surgery:  Drink ONE (1) Pre-Surgery Clear Ensure by 4:30 am the morning of surgery. Drink in one sitting. Do not sip.  This drink was given to you during your hospital  pre-op appointment visit. Nothing else to drink after completing the  Pre-Surgery Clear Ensure.          If you have questions, please contact your surgeon's office.     Oral Hygiene is also important to reduce your risk of infection.                                    Remember - BRUSH YOUR TEETH THE MORNING OF SURGERY WITH YOUR REGULAR TOOTHPASTE   Take these medicines the morning of surgery with A SIP OF WATER: Tylenol, Euthyrox, Gabapentin, Hydroxyzine, Claritin, Pantoprazole, Sertraline.                              You may not have any metal on your body including hair pins, jewelry, and body piercing             Do not wear make-up, lotions, powders, perfumes, or deodorant  Do not wear nail polish including gel and S&S, artificial/acrylic nails, or any other type of covering on natural nails including finger and toenails. If you have artificial nails, gel coating, etc. that needs to be removed by a nail salon please have this removed prior to surgery or surgery may need to be canceled/ delayed if the surgeon/ anesthesia feels like they are unable to be safely monitored.   Do not shave  48 hours prior to surgery.    Do not bring valuables to the hospital. Many.   Contacts, dentures or bridgework may not be worn into surgery.   Bring small overnight bag day of surgery.   Special Instructions: Bring a copy of your healthcare power of attorney  and living will documents         the day of surgery if you haven't scanned them in before.  Please read over the following fact sheets you were given: IF YOU HAVE QUESTIONS ABOUT YOUR PRE OP INSTRUCTIONS PLEASE CALL [place] - Preparing for Surgery Before surgery, you can play an important role.  Because skin is not sterile, your skin needs to be as free of germs as possible.  You can reduce the number of germs on your skin by washing with CHG (chlorahexidine gluconate) soap before surgery.  CHG is an antiseptic cleaner which kills germs and bonds with the skin to continue killing germs even after washing. Please DO NOT use if you have an allergy to CHG or antibacterial soaps.  If your skin becomes reddened/irritated stop using the CHG and inform your nurse when you arrive at Short Stay. Do not shave (including legs and underarms) for at least 48 hours prior to the first CHG shower.  You may shave your face/neck.  Please follow these instructions carefully:  1.  Shower with CHG Soap the night before surgery and the  morning of surgery.  2.  If you choose to wash your hair, wash  your hair first as usual with your normal  shampoo.  3.  After you shampoo, rinse your hair and body thoroughly to remove the shampoo.                             4.  Use CHG as you would any other liquid soap.  You can apply chg directly to the skin and wash.  Gently with a scrungie or clean washcloth.  5.  Apply the CHG Soap to your body ONLY FROM THE NECK DOWN.   Do   not use on face/ open                           Wound or open sores. Avoid contact with eyes, ears mouth and   genitals (private parts).                       Wash face,  Genitals (private parts) with your normal soap.             6.  Wash thoroughly, paying special attention to the area where your    surgery  will be performed.  7.  Thoroughly rinse your body with warm water from the neck down.  8.  DO NOT shower/wash with your  normal soap after using and rinsing off the CHG Soap.                9.  Pat yourself dry with a clean towel.            10.  Wear clean pajamas.            11.  Place clean sheets on your bed the night of your first shower and do not  sleep with pets. Day of Surgery : Do not apply any lotions/deodorants the morning of surgery.  Please wear clean clothes to the hospital/surgery center.  FAILURE TO FOLLOW THESE INSTRUCTIONS MAY RESULT IN THE CANCELLATION OF YOUR SURGERY  PATIENT SIGNATURE_________________________________  NURSE SIGNATURE__________________________________  ________________________________________________________________________   Jennifer Hull  An incentive spirometer is a tool that can help keep your lungs clear and active. This tool measures how well you are filling your lungs with each breath. Taking long deep breaths may help reverse or decrease the chance of developing breathing (pulmonary) problems (especially infection) following: A long period of time when you are unable to move or be active. BEFORE THE PROCEDURE  If the spirometer includes an indicator to show your best effort, your nurse or respiratory therapist will set it to a desired goal. If possible, sit up straight or lean slightly forward. Try not to slouch. Hold the incentive spirometer in an upright position. INSTRUCTIONS FOR USE  Sit on the edge of your bed if possible, or sit up as far as you can in bed or on a chair. Hold the incentive spirometer in an upright position. Breathe out normally. Place the mouthpiece in your mouth and seal your lips tightly around it. Breathe in slowly and as deeply as possible, raising the piston or the ball toward the top of the column. Hold your breath for 3-5 seconds or for as long as possible. Allow the piston or ball to fall to the bottom of the column. Remove the mouthpiece from your mouth and breathe out normally. Rest for a few seconds and repeat Steps 1  through 7 at least 10 times  every 1-2 hours when you are awake. Take your time and take a few normal breaths between deep breaths. The spirometer may include an indicator to show your best effort. Use the indicator as a goal to work toward during each repetition. After each set of 10 deep breaths, practice coughing to be sure your lungs are clear. If you have an incision (the cut made at the time of surgery), support your incision when coughing by placing a pillow or rolled up towels firmly against it. Once you are able to get out of bed, walk around indoors and cough well. You may stop using the incentive spirometer when instructed by your caregiver.  RISKS AND COMPLICATIONS Take your time so you do not get dizzy or light-headed. If you are in pain, you may need to take or ask for pain medication before doing incentive spirometry. It is harder to take a deep breath if you are having pain. AFTER USE Rest and breathe slowly and easily. It can be helpful to keep track of a log of your progress. Your caregiver can provide you with a simple table to help with this. If you are using the spirometer at home, follow these instructions: [place] IF:  You are having difficultly using the spirometer. You have trouble using the spirometer as often as instructed. Your pain medication is not giving enough relief while using the spirometer. You develop fever of 100.5 F (38.1 C) or higher. SEEK IMMEDIATE MEDICAL CARE IF:  You cough up bloody sputum that had not been present before. You develop fever of 102 F (38.9 C) or greater. You develop worsening pain at or near the incision site. MAKE SURE YOU:  Understand these instructions. Will watch your condition. Will get help right away if you are not doing well or get worse. Document Released: 02/05/2007 Document Revised: 12/18/2011 Document Reviewed: 04/08/2007 ExitCare Patient Information 2014 ExitCare,  [place].   ________________________________________________________________________  WHAT IS A BLOOD TRANSFUSION? Blood Transfusion Information  A transfusion is the replacement of blood or some of its parts. Blood is made up of multiple cells which provide different functions. Red blood cells carry oxygen and are used for blood loss replacement. White blood cells fight against infection. Platelets control bleeding. Plasma helps clot blood. Other blood products are available for specialized needs, such as hemophilia or other clotting disorders. BEFORE THE TRANSFUSION  Who gives blood for transfusions?  Healthy volunteers who are fully evaluated to make sure their blood is safe. This is blood bank blood. Transfusion therapy is the safest it has ever been in the practice of medicine. Before blood is taken from a donor, a complete history is taken to make sure that person has no history of diseases nor engages in risky social behavior (examples are intravenous drug use or sexual activity with multiple partners). The donor's travel history is screened to minimize risk of transmitting infections, such as malaria. The donated blood is tested for signs of infectious diseases, such as HIV and hepatitis. The blood is then tested to be sure it is compatible with you in order to minimize the chance of a transfusion reaction. If you or a relative donates blood, this is often done in anticipation of surgery and is not appropriate for emergency situations. It takes many days to process the donated blood. RISKS AND COMPLICATIONS Although transfusion therapy is very safe and saves many lives, the main dangers of transfusion include:  Getting an infectious disease. Developing a transfusion reaction. This is an  allergic reaction to something in the blood you were given. Every precaution is taken to prevent this. The decision to have a blood transfusion has been considered carefully by your caregiver before blood is  given. Blood is not given unless the benefits outweigh the risks. AFTER THE TRANSFUSION Right after receiving a blood transfusion, you will usually feel much better and more energetic. This is especially true if your red blood cells have gotten low (anemic). The transfusion raises the level of the red blood cells which carry oxygen, and this usually causes an energy increase. The nurse administering the transfusion will monitor you carefully for complications. HOME CARE INSTRUCTIONS  No special instructions are needed after a transfusion. You may find your energy is better. Speak with your caregiver about any limitations on activity for underlying diseases you may have. SEEK MEDICAL CARE IF:  Your condition is not improving after your transfusion. You develop redness or irritation at the intravenous (IV) site. SEEK IMMEDIATE MEDICAL CARE IF:  Any of the following symptoms occur over the next 12 hours: Shaking chills. You have a temperature by mouth above 102 F (38.9 C), not controlled by medicine. Chest, back, or muscle pain. People around you feel you are not acting correctly or are confused. Shortness of breath or difficulty breathing. Dizziness and fainting. You get a rash or develop hives. You have a decrease in urine output. Your urine turns a dark color or changes to pink, red, or brown. Any of the following symptoms occur over the next 10 days: You have a temperature by mouth above 102 F (38.9 C), not controlled by medicine. Shortness of breath. Weakness after normal activity. The white part of the eye turns yellow (jaundice). You have a decrease in the amount of urine or are urinating less often. Your urine turns a dark color or changes to pink, red, or brown. Document Released: 09/22/2000 Document Revised: 12/18/2011 Document Reviewed: 05/11/2008 ExitCare Patient Information 2014 [employer].  _______________________________________________________________________ [facility]  [facility]- Preparing for Total Shoulder Arthroplasty    Before surgery, you can play an important role. Because skin is not sterile, your skin needs to be as free of germs as possible. You can reduce the number of germs on your skin by using the following products. Benzoyl Peroxide Gel Reduces the number of germs present on the skin Applied twice a day to shoulder area starting two days before surgery    ==================================================================  Please follow these instructions carefully:  BENZOYL PEROXIDE 5% GEL  Please do not use if you have an allergy to benzoyl peroxide.   If your skin becomes reddened/irritated stop using the benzoyl peroxide.  Starting two days before surgery, apply as follows: Apply benzoyl peroxide in the morning and at night. Apply after taking a shower. If you are not taking a shower clean entire shoulder front, back, and side along with the armpit with a clean wet washcloth.  Place a quarter-sized dollop on your shoulder and rub in thoroughly, making sure to cover the front, back, and side of your shoulder, along with the armpit.   2 days before ____ AM   ____ PM              1 day before ____ AM   ____ PM                         Do this twice a day for two days.  (Last application is the night before surgery, AFTER  using the CHG soap as described below).  Do NOT apply benzoyl peroxide gel on the day of surgery.

## 2021-06-02 ENCOUNTER — Encounter (HOSPITAL_COMMUNITY)
Admission: RE | Admit: 2021-06-02 | Discharge: 2021-06-02 | Disposition: A | Discharge status: Home / Self Care | Payer: Medicare Other | Source: Ambulatory Visit | Attending: Orthopedic Surgery | Admitting: Orthopedic Surgery

## 2021-06-02 ENCOUNTER — Encounter (HOSPITAL_COMMUNITY): Payer: Self-pay

## 2021-06-02 ENCOUNTER — Other Ambulatory Visit: Payer: Self-pay

## 2021-06-02 DIAGNOSIS — Z01818 Encounter for other preprocedural examination: Secondary | ICD-10-CM | POA: Diagnosis not present

## 2021-06-02 HISTORY — DX: Hypothyroidism, unspecified: E03.9

## 2021-06-02 HISTORY — DX: Pneumonia, unspecified organism: J18.9

## 2021-06-02 HISTORY — DX: Other constipation: K59.09

## 2021-06-02 LAB — COMPREHENSIVE METABOLIC PANEL
ALT: 17 U/L (ref 0–44)
AST: 19 U/L (ref 15–41)
Albumin: 4.7 g/dL (ref 3.5–5.0)
Alkaline Phosphatase: 55 U/L (ref 38–126)
Anion gap: 9 (ref 5–15)
BUN: 16 mg/dL (ref 8–23)
CO2: 22 mmol/L (ref 22–32)
Calcium: 9.4 mg/dL (ref 8.9–10.3)
Chloride: 104 mmol/L (ref 98–111)
Creatinine, Ser: 0.63 mg/dL (ref 0.44–1.00)
GFR, Estimated: >60 mL/min (ref >60)
Glucose, Bld: 84 mg/dL (ref 70–99)
Potassium: 4.1 mmol/L (ref 3.5–5.1)
Sodium: 135 mmol/L (ref 135–145)
Total Bilirubin: 0.8 mg/dL (ref 0.3–1.2)
Total Protein: 6.8 g/dL (ref 6.5–8.1)

## 2021-06-02 LAB — CBC WITH DIFFERENTIAL/PLATELET
Abs Immature Granulocytes: 0.01 10*3/uL (ref 0.00–0.07)
Basophils Absolute: 0.1 10*3/uL (ref 0.0–0.1)
Basophils Relative: 1 %
Eosinophils Absolute: 0.1 10*3/uL (ref 0.0–0.5)
Eosinophils Relative: 3 %
HCT: 41 % (ref 36.0–46.0)
Hemoglobin: 14.1 g/dL (ref 12.0–15.0)
Immature Granulocytes: 0 %
Lymphocytes Relative: 38 %
Lymphs Abs: 1.7 10*3/uL (ref 0.7–4.0)
MCH: 31.8 pg (ref 26.0–34.0)
MCHC: 34.4 g/dL (ref 30.0–36.0)
MCV: 92.6 fL (ref 80.0–100.0)
Monocytes Absolute: 0.6 10*3/uL (ref 0.1–1.0)
Monocytes Relative: 12 %
Neutro Abs: 2 10*3/uL (ref 1.7–7.7)
Neutrophils Relative %: 46 %
Platelets: 176 10*3/uL (ref 150–400)
RBC: 4.43 MIL/uL (ref 3.87–5.11)
RDW: 12.6 % (ref 11.5–15.5)
WBC: 4.5 10*3/uL (ref 4.0–10.5)
nRBC: 0 % (ref 0.0–0.2)

## 2021-06-02 LAB — URINALYSIS, ROUTINE W REFLEX MICROSCOPIC
Bilirubin Urine: NEGATIVE
Glucose, UA: NEGATIVE mg/dL
Hgb urine dipstick: NEGATIVE
Ketones, ur: NEGATIVE mg/dL
Leukocytes,Ua: NEGATIVE
Nitrite: NEGATIVE
Protein, ur: NEGATIVE mg/dL
Specific Gravity, Urine: 1.008 (ref 1.005–1.030)
pH: 6 (ref 5.0–8.0)

## 2021-06-02 LAB — SURGICAL PCR SCREEN
MRSA, PCR: NEGATIVE
Staphylococcus aureus: NEGATIVE

## 2021-06-02 LAB — APTT: aPTT: 28 seconds (ref 24–36)

## 2021-06-04 ENCOUNTER — Encounter: Payer: Self-pay | Admitting: Family Medicine

## 2021-06-07 ENCOUNTER — Ambulatory Visit (INDEPENDENT_AMBULATORY_CARE_PROVIDER_SITE_OTHER): Payer: Medicare Other | Admitting: Physician Assistant

## 2021-06-07 ENCOUNTER — Other Ambulatory Visit: Payer: Self-pay | Admitting: Orthopedic Surgery

## 2021-06-07 ENCOUNTER — Encounter: Payer: Self-pay | Admitting: Physician Assistant

## 2021-06-07 VITALS — BP 100/56 | HR 70 | Ht 64.0 in | Wt 133.1 lb

## 2021-06-07 DIAGNOSIS — K581 Irritable bowel syndrome with constipation: Secondary | ICD-10-CM

## 2021-06-07 MED ORDER — LINACLOTIDE 290 MCG PO CAPS: 290.0000 ug | ORAL_CAPSULE | Freq: Every day | ORAL | 3 refills | Status: DC

## 2021-06-07 NOTE — Patient Instructions (Signed)
We have sent the following medications to your pharmacy for you to pick up at your convenience: Linzess 290 mcg daily every morning.   Bowel Purge instructions: Purchase a bottle of Miralax over the counter as well as a box of 5 mg dulcolax tablets. Take 4 dulcolax tablets. Wait 1 hour. You will then drink 6-8 capfuls of Miralax mixed in an adequate amount of water/juice/gatorade (you may choose which of these liquids to drink) over the next 2-3 hours. You should expect results within 1 to 6 hours after completing the bowel purge.  If you are age 29 or older, your body mass index should be between 23-30. Your Body mass index is 22.85 kg/m. If this is out of the aforementioned range listed, please consider follow up with your Primary Care Provider.  If you are age 74 or younger, your body mass index should be between 19-25. Your Body mass index is 22.85 kg/m. If this is out of the aformentioned range listed, please consider follow up with your Primary Care Provider.   __________________________________________________________  The [facility] providers would like to encourage you to use [facility] to communicate with providers for non-urgent requests or questions.  Due to long hold times on the telephone, sending your provider a message by [facility] may be a faster and more efficient way to get a response.  Please allow 48 business hours for a response.  Please remember that this is for non-urgent requests.

## 2021-06-07 NOTE — Progress Notes (Signed)
Chief Complaint: Constipation/diarrhea  HPI:    Jennifer Hull was a 80 year old Caucasian female with a past medical history of chronic constipation, rheumatoid arteritis and others listed below, known to Dr. Rush Landmark, who presents to clinic today with a complaint of constipation/diarrhea.    09/02/2019 EGD and colonoscopy.  Colonoscopy with 8 polyps in the rectum, erythematous mucosa in the mid rectum and distal rectum and nonbleeding nonthrombosed boast external and internal hemorrhoids.  EGD with small hiatal hernia, gastritis and no gross lesions in the duodenum.  Pathology was significant for gastritis and a mixture of tubular adenomas and sessile serrated polyp.    09/20/2020 patient seen in clinic for follow-up of her constipation and GERD.  At that time she was doing well as long as she took her Pantoprazole 40 mg every morning and her Linzess 145 mcg daily.  At that time would occasionally go 2 days without a bowel movement then she would have one on the third.  She was very happy.  Patient's Pantoprazole was refilled at 40 mg daily and Linzess 145 mcg daily.    Today, patient tells me that over the past 3 to 4 months she has been having trouble with constipation.  Initially was using Linzess 145 mcg daily but this started not to work.  Tells me that she had 2 diarrheal stools and then just "little tiny balls of stool".  She had some Linzess 290 mcg at home so she started some of that and did okay for 1 to 2 days but then skipped a day and continued with little BMs.  Has become very distended with a lot of gas and using Simethicone two 50 mg tabs a day to help.  She increased her fruit and vegetable intake as well as avocados which did not help.  Also started some type of "good for your gut powder", but this did not help so she stopped it.  Most recently over the past week she has been using 1 Linzess 145 mcg daily and 2 to 3 tablespoons of flaxseed and has started going better with no further  episodes of diarrhea but is having a lot of gas.  She is somewhat happier but is still not normal.  Tells me she is dreading the fact that she has a shoulder surgery coming up within the week because last time she became very constipated after her other shoulder surgery.    Denies fever, chills, blood in her stool, weight loss, nausea or vomiting.  Past Medical History:  Diagnosis Date   Anxiety    Arthritis    Chronic constipation    Depression    Hyperlipidemia    Hypothyroidism    Pneumonia    Rheumatoid arthritis ([place]) 2019   Temporal arteritis ([place])    Thyroid disease     Past Surgical History:  Procedure Laterality Date   ABDOMINAL HYSTERECTOMY     BREAST EXCISIONAL BIOPSY Left    BREAST SURGERY     biopsy   COLONOSCOPY     TEMPORAL ARTERY BIOPSY / LIGATION Bilateral 2014   TONSILLECTOMY     TOTAL SHOULDER ARTHROPLASTY Right 11/22/2017   Procedure: RIGHT REVERSE TOTAL SHOULDER ARTHROPLASTY;  Surgeon: Tania Ade, MD;  Location: [facility];  Service: Orthopedics;  Laterality: Right;    Current Outpatient Medications  Medication Sig Dispense Refill   acetaminophen (TYLENOL) 500 MG tablet Take 1,000 mg by mouth every 8 (eight) hours as needed for moderate pain.     atorvastatin (LIPITOR) 20  MG tablet Take 1 tablet by mouth once daily (Patient taking differently: Take 20 mg by mouth at bedtime.) 90 tablet 3   Calcium Carb-Cholecalciferol (CALCIUM 600+D3 PO) Take 1 tablet by mouth daily with lunch.     cholecalciferol (VITAMIN D3) 25 MCG (1000 UNIT) tablet Take 1,000 Units by mouth daily.     EUTHYROX 112 MCG tablet TAKE 1 TABLET BY MOUTH ONCE DAILY BEFORE BREAKFAST (Patient taking differently: Take 112 mcg by mouth daily before breakfast.) 90 tablet 2   Flaxseed, Linseed, (FLAXSEED OIL PO) Take 3 g by mouth daily.     fluticasone (FLONASE) 50 MCG/ACT nasal spray Place 2 sprays into both nostrils at bedtime.     folic acid (FOLVITE) Q000111Q MCG tablet Take 800 mcg by mouth daily.      gabapentin (NEURONTIN) 300 MG capsule Take 1 capsule (300 mg total) by mouth 3 (three) times daily. (Patient taking differently: Take 1 capsule by mouth 2 (two) times daily.) 90 capsule 3   hydrOXYzine (ATARAX/VISTARIL) 25 MG tablet Take 1 tablet (25 mg total) by mouth daily as needed. 30 tablet 2   linaclotide (LINZESS) 145 MCG CAPS capsule Take 1 capsule (145 mcg total) by mouth daily before breakfast. (Patient not taking: No sig reported) 90 capsule 3   loratadine (CLARITIN) 10 MG tablet Take 1 tablet (10 mg total) by mouth daily. 30 tablet 11   magnesium oxide (MAG-OX) 400 MG tablet Take 400 mg by mouth daily.     Melatonin 10 MG CAPS Take 10 mg by mouth at bedtime.     meloxicam (MOBIC) 7.5 MG tablet Take 7.5 mg by mouth daily as needed for pain.     Omega-3 Fatty Acids (FISH OIL) 1000 MG CAPS Take 1,000 mg by mouth daily.     pantoprazole (PROTONIX) 40 MG tablet Take 1 tablet (40 mg total) by mouth daily. 90 tablet 3   sertraline (ZOLOFT) 100 MG tablet TAKE 1 & 1/2 (ONE & ONE-HALF) TABLETS BY MOUTH ONCE DAILY (Patient taking differently: Take 150 mg by mouth daily.) 135 tablet 0   temazepam (RESTORIL) 15 MG capsule TAKE 1 CAPSULE BY MOUTH AT BEDTIME AS NEEDED FOR SLEEP (Patient taking differently: Take 15 mg by mouth at bedtime as needed for sleep. TAKE 1 CAPSULE BY MOUTH AT BEDTIME AS NEEDED FOR SLEEP) 30 capsule 2   Tocilizumab (ACTEMRA IV) Inject into the vein every 30 (thirty) days.     TURMERIC CURCUMIN PO Take 3,000 mg by mouth daily.     zinc gluconate 50 MG tablet Take 50 mg by mouth daily.     No current facility-administered medications for this visit.    Allergies as of 06/07/2021 - Review Complete 06/02/2021  Allergen Reaction Noted   Tdap [tetanus-diphth-acell pertussis] Other (See Comments) 07/27/2016   Tetanus toxoid, adsorbed  12/07/2017   Hydroxychloroquine sulfate Rash 06/02/2021   Covid-19 (mrna) vaccine (pfizer) [covid-19 (mrna) vaccine] Rash 05/13/2021     Family History  Problem Relation Age of Onset   Kidney cancer Mother    Heart disease Father    Breast cancer Maternal Aunt    Colon cancer Neg Hx    Esophageal cancer Neg Hx    Rectal cancer Neg Hx    Stomach cancer Neg Hx     Social History   Socioeconomic History   Marital status: Married    Spouse name: Not on file   Number of children: Not on file   Years of education: Not on file  Highest education level: Not on file  Occupational History   Not on file  Tobacco Use   Smoking status: Former    Packs/day: 1.50    Years: 8.00    Pack years: 12.00    Types: Cigarettes    Quit date: 75    Years since quitting: 44.6   Smokeless tobacco: Never   Tobacco comments:    smoked infrequently when she was young; quit 80 yo   Vaping Use   Vaping Use: Never used  Substance and Sexual Activity   Alcohol use: No   Drug use: No   Sexual activity: Yes    Partners: Male    Birth control/protection: Surgical  Other Topics Concern   Not on file  Social History Narrative   Not on file   Social Determinants of Health   Financial Resource Strain: Not on file  Food Insecurity: Not on file  Transportation Needs: Not on file  Physical Activity: Not on file  Stress: Not on file  Social Connections: Not on file  Intimate Partner Violence: Not on file    Review of Systems:    Constitutional: No weight loss, fever or chills Cardiovascular: No chest pain  Respiratory: No SOB  Gastrointestinal: See HPI and otherwise negative   Physical Exam:  Vital signs: BP (!) 100/56   Pulse 70   Ht '5\' 4"'$  (1.626 m)   Wt 133 lb 2 oz (60.4 kg)   BMI 22.85 kg/m    Constitutional:   Very Pleasant Elderly Caucasian female appears to be in NAD, Well developed, Well nourished, alert and cooperative Respiratory: Respirations even and unlabored. Lungs clear to auscultation bilaterally.   No wheezes, crackles, or rhonchi.  Cardiovascular: Normal S1, S2. No MRG. Regular rate and rhythm. No  peripheral edema, cyanosis or pallor.  Gastrointestinal:  Soft, mild distention, nontender. No rebound or guarding.  Decreased bowel sounds all 4 quadrants no appreciable masses or hepatomegaly. Rectal:  Not performed.  Psychiatric: Demonstrates good judgement and reason without abnormal affect or behaviors.  RELEVANT LABS AND IMAGING: CBC    Component Value Date/Time   WBC 4.5 06/02/2021 0947   RBC 4.43 06/02/2021 0947   HGB 14.1 06/02/2021 0947   HCT 41.0 06/02/2021 0947   PLT 176 06/02/2021 0947   MCV 92.6 06/02/2021 0947   MCH 31.8 06/02/2021 0947   MCHC 34.4 06/02/2021 0947   RDW 12.6 06/02/2021 0947   LYMPHSABS 1.7 06/02/2021 0947   MONOABS 0.6 06/02/2021 0947   EOSABS 0.1 06/02/2021 0947   BASOSABS 0.1 06/02/2021 0947    CMP     Component Value Date/Time   NA 135 06/02/2021 0947   NA 144 03/23/2021 0000   K 4.1 06/02/2021 0947   CL 104 06/02/2021 0947   CO2 22 06/02/2021 0947   GLUCOSE 84 06/02/2021 0947   BUN 16 06/02/2021 0947   BUN 16 03/23/2021 0000   CREATININE 0.63 06/02/2021 0947   CALCIUM 9.4 06/02/2021 0947   PROT 6.8 06/02/2021 0947   ALBUMIN 4.7 06/02/2021 0947   AST 19 06/02/2021 0947   ALT 17 06/02/2021 0947   ALKPHOS 55 06/02/2021 0947   BILITOT 0.8 06/02/2021 0947   GFRNONAA >60 06/02/2021 0947   GFRAA >60 11/23/2017 0710    Assessment: 1.  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  Recommend the patient increase her Linzess to 290 mcg daily.  Prescribed this for her #30 with 5 refills. 2.  Encouraged the patient to stay on the medication above for at least a week before discontinuing it unless she is having diarrhea all day long.  May find that she does not need the flaxseed with  this, which is likely adding to her gas. 3.  Provided the patient with information regarding a MiraLAX bowel purge so that she can do this if she feels severely constipated after upcoming shoulder surgery. 4.  Patient will call and let us know how she is doing.  Otherwise set her for a follow-up with me in 4 to 6 weeks.  Ellouise Newer, PA-C [facility] 06/07/2021, 3:00 PM  Cc: Martinique, Betty G, MD

## 2021-06-07 NOTE — Progress Notes (Signed)
Attending Physician's Attestation   I have reviewed the chart.   I agree with the Advanced Practitioner's note, impression, and recommendations with any updates as below.    Ryley Bachtel Mansouraty, MD [facility] Advanced [facility]oscopy Office # [phone]  

## 2021-06-08 ENCOUNTER — Encounter: Payer: Self-pay | Admitting: Family Medicine

## 2021-06-08 ENCOUNTER — Ambulatory Visit (INDEPENDENT_AMBULATORY_CARE_PROVIDER_SITE_OTHER): Payer: Medicare Other | Admitting: Family Medicine

## 2021-06-08 ENCOUNTER — Other Ambulatory Visit: Payer: Self-pay

## 2021-06-08 VITALS — BP 110/60 | HR 79 | Resp 16 | Ht 64.0 in | Wt 133.1 lb

## 2021-06-08 DIAGNOSIS — M069 Rheumatoid arthritis, unspecified: Secondary | ICD-10-CM | POA: Diagnosis not present

## 2021-06-08 DIAGNOSIS — Z01818 Encounter for other preprocedural examination: Secondary | ICD-10-CM | POA: Diagnosis not present

## 2021-06-08 DIAGNOSIS — M25512 Pain in left shoulder: Secondary | ICD-10-CM

## 2021-06-08 DIAGNOSIS — G8929 Other chronic pain: Secondary | ICD-10-CM | POA: Diagnosis not present

## 2021-06-08 LAB — SARS CORONAVIRUS 2 (TAT 6-24 HRS): SARS Coronavirus 2: NEGATIVE

## 2021-06-08 NOTE — Progress Notes (Signed)
ACUTE VISIT Chief Complaint  Patient presents with   surgical clearance   HPI: [name] is a 80 y.o. female, who is here today for preop evaluation requested by Dr. Tamera Punt. History of generalized OA and RA. Left shoulder pain is getting worse. Planning on reverse shoulder arthroplasty on 06/09/2021.  Denies any CP, dyspnea, diaphoresis, palpitation, or dizziness with activities like walking a hill or carrying groceries. Most ADLs and IADLs independent, sometimes she needs assistance with a cane depending up level of pain.  Due to chronic pain she cannot exercise regularly or do brisk walking. She cannot climb a flight of stairs. Negative for tobacco use. No history of CAD, CHF, CKD, or hypertension.  She follows with rheumatologist, who already gave recommendations in regard to tocilizumab, she has not taken it for about 4 weeks. She already discontinue all OTC supplements. She also discontinue meloxicam 7 days ago.  Currently she is on temazepam 15 mg daily at bedtime as needed for insomnia and anxiety, sertraline 150 mg daily for depression, pantoprazole 40 mg daily for GERD, Lasix 290 mcg daily for constipation, gabapentin 300 mg twice daily for peripheral neuropathy, Euthyrox 112 mcg daily, and atorvastatin 20 mg daily.  She underwent left shoulder total replacement in 2017 with general anesthesia.  She did well during procedure, no.  Surgical complications and recovered well.  She had blood work and EKG done recently.  Lab Results  Component Value Date   CREATININE 0.63 06/02/2021   BUN 16 06/02/2021   NA 135 06/02/2021   K 4.1 06/02/2021   CL 104 06/02/2021   CO2 22 06/02/2021   Lab Results  Component Value Date   WBC 4.5 06/02/2021   HGB 14.1 06/02/2021   HCT 41.0 06/02/2021   MCV 92.6 06/02/2021   PLT 176 06/02/2021   Lab Results  Component Value Date   ALT 17 06/02/2021   AST 19 06/02/2021   ALKPHOS 55 06/02/2021   BILITOT 0.8 06/02/2021    Review of Systems  Constitutional:  Positive for fatigue. Negative for activity change, appetite change and fever.  HENT:  Negative for mouth sores, nosebleeds and sore throat.   Eyes:  Negative for discharge and redness.  Respiratory:  Negative for cough, shortness of breath and wheezing.   Cardiovascular:  Negative for chest pain, palpitations and leg swelling.  Gastrointestinal:  Negative for abdominal pain, nausea and vomiting.       Negative for changes in bowel habits.  Genitourinary:  Negative for decreased urine volume, dysuria and hematuria.  Musculoskeletal:  Positive for arthralgias and gait problem.  Skin:  Negative for pallor and rash.  Neurological:  Negative for syncope, weakness and headaches.  Psychiatric/Behavioral:  Negative for confusion. The patient is nervous/anxious.   Rest see pertinent positives and negatives per HPI.  Current Outpatient Medications on File Prior to Visit  Medication Sig Dispense Refill   acetaminophen (TYLENOL) 500 MG tablet Take 1,000 mg by mouth every 8 (eight) hours as needed for moderate pain.     atorvastatin (LIPITOR) 20 MG tablet Take 1 tablet by mouth once daily (Patient taking differently: Take 20 mg by mouth at bedtime.) 90 tablet 3   Calcium Carb-Cholecalciferol (CALCIUM 600+D3 PO) Take 1 tablet by mouth daily with lunch.     cholecalciferol (VITAMIN D3) 25 MCG (1000 UNIT) tablet Take 1,000 Units by mouth daily.     EUTHYROX 112 MCG tablet TAKE 1 TABLET BY MOUTH ONCE DAILY BEFORE BREAKFAST (Patient taking differently:  Take 112 mcg by mouth daily before breakfast.) 90 tablet 2   Flaxseed, Linseed, (FLAXSEED OIL PO) Take 3 g by mouth daily.     fluticasone (FLONASE) 50 MCG/ACT nasal spray Place 2 sprays into both nostrils at bedtime.     folic acid (FOLVITE) Q000111Q MCG tablet Take 800 mcg by mouth daily.     gabapentin (NEURONTIN) 300 MG capsule Take 1 capsule (300 mg total) by mouth 3 (three) times daily. (Patient taking differently: Take  1 capsule by mouth 2 (two) times daily.) 90 capsule 3   hydrOXYzine (ATARAX/VISTARIL) 25 MG tablet Take 1 tablet (25 mg total) by mouth daily as needed. 30 tablet 2   linaclotide (LINZESS) 290 MCG CAPS capsule Take 1 capsule (290 mcg total) by mouth daily before breakfast. 30 capsule 3   loratadine (CLARITIN) 10 MG tablet Take 1 tablet (10 mg total) by mouth daily. 30 tablet 11   magnesium oxide (MAG-OX) 400 MG tablet Take 400 mg by mouth daily.     Melatonin 10 MG CAPS Take 10 mg by mouth at bedtime.     meloxicam (MOBIC) 7.5 MG tablet Take 7.5 mg by mouth daily as needed for pain.     Omega-3 Fatty Acids (FISH OIL) 1000 MG CAPS Take 1,000 mg by mouth daily.     pantoprazole (PROTONIX) 40 MG tablet Take 1 tablet (40 mg total) by mouth daily. 90 tablet 3   sertraline (ZOLOFT) 100 MG tablet TAKE 1 & 1/2 (ONE & ONE-HALF) TABLETS BY MOUTH ONCE DAILY (Patient taking differently: Take 150 mg by mouth daily.) 135 tablet 0   simethicone (MYLICON) 0000000 MG chewable tablet Chew 250 mg by mouth once.     temazepam (RESTORIL) 15 MG capsule TAKE 1 CAPSULE BY MOUTH AT BEDTIME AS NEEDED FOR SLEEP (Patient taking differently: Take 15 mg by mouth at bedtime as needed for sleep. TAKE 1 CAPSULE BY MOUTH AT BEDTIME AS NEEDED FOR SLEEP) 30 capsule 2   Tocilizumab (ACTEMRA IV) Inject into the vein every 30 (thirty) days.     TURMERIC CURCUMIN PO Take 3,000 mg by mouth daily.     zinc gluconate 50 MG tablet Take 50 mg by mouth daily.     No current facility-administered medications on file prior to visit.   Past Medical History:  Diagnosis Date   Anxiety    Arthritis    Chronic constipation    Depression    Hyperlipidemia    Hypothyroidism    Pneumonia    Rheumatoid arthritis ([place]) 2019   Temporal arteritis ([facility])    Thyroid disease    Allergies  Allergen Reactions   Tdap [Tetanus-Diphth-Acell Pertussis] Other (See Comments)    SEVERE ALLERGIC REACTIONS (NECK STIFFNESS/HIGH FEVER/ETC)   Tetanus Toxoid,  Adsorbed     Neck stiffness, and high fever Other reaction(s): Other (See Comments) Neck stiffness, and high fever   Hydroxychloroquine Sulfate Rash   Covid-19 (Mrna) Vaccine Therapist, music) [Covid-19 (Mrna) Vaccine] Rash    Social History   Socioeconomic History   Marital status: Married    Spouse name: Not on file   Number of children: Not on file   Years of education: Not on file   Highest education level: Not on file  Occupational History   Not on file  Tobacco Use   Smoking status: Former    Packs/day: 1.50    Years: 8.00    Pack years: 12.00    Types: Cigarettes    Quit date: 77  Years since quitting: 44.6   Smokeless tobacco: Never   Tobacco comments:    smoked infrequently when she was young; quit 80 yo   Vaping Use   Vaping Use: Never used  Substance and Sexual Activity   Alcohol use: No   Drug use: No   Sexual activity: Yes    Partners: Male    Birth control/protection: Surgical  Other Topics Concern   Not on file  Social History Narrative   Not on file   Social Determinants of Health   Financial Resource Strain: Not on file  Food Insecurity: Not on file  Transportation Needs: Not on file  Physical Activity: Not on file  Stress: Not on file  Social Connections: Not on file   Vitals:   06/08/21 1355  BP: 110/60  Pulse: 79  Resp: 16  SpO2: 98%   Body mass index is 22.85 kg/m.  Physical Exam Vitals and nursing note reviewed.  Constitutional:      General: She is not in acute distress.    Appearance: She is well-developed.  HENT:     Head: Normocephalic and atraumatic.     Mouth/Throat:     Mouth: Mucous membranes are moist.     Pharynx: Oropharynx is clear.  Eyes:     Conjunctiva/sclera: Conjunctivae normal.  Cardiovascular:     Rate and Rhythm: Normal rate and regular rhythm.     Pulses:          Dorsalis pedis pulses are 2+ on the right side and 2+ on the left side.     Heart sounds: No murmur heard. Pulmonary:     Effort: Pulmonary  effort is normal. No respiratory distress.     Breath sounds: Normal breath sounds.  Abdominal:     Palpations: Abdomen is soft. There is no hepatomegaly or mass.     Tenderness: There is no abdominal tenderness.  Musculoskeletal:     Left shoulder: Tenderness present. Decreased range of motion.  Skin:    General: Skin is warm.     Findings: No erythema or rash.  Neurological:     General: No focal deficit present.     Mental Status: She is alert and oriented to person, place, and time.     Cranial Nerves: No cranial nerve deficit.     Comments: Antalgic gait , not assisted.  Psychiatric:     Comments: Well groomed, good eye contact.   ASSESSMENT AND PLAN:  Jennifer Hull was seen today for surgical clearance.  Diagnoses and all orders for this visit:  Pre-op evaluation Chronic problems otherwise stable. She already stopped OTC supplements and meloxicam a week ago. Instructed to take tomorrow's medication in the afternoon after surgery. DVT prophylaxis: Early ambulation. Lab results reviewed.  EKG done on 06/02/2021 with SR, normal axis,short PR interval, and nonspecific T wave abnormalities on anterior leads.  Biphasic T wave V2 in 2017 but overall no significant changes when compared with EKG done in 07/2016.   She truly believes she will greatly benefit from left shoulder surgery and wishes to have procedure done as a scheduled. She is cleared to proceed with left shoulder arthroplasty. Surgical clearance request form and copy of note will be completed and faxed to Dr. Tamera Punt.  Chronic left shoulder pain Problem is getting worse. Following with orthopedics.  Rheumatoid arthritis involving multiple sites, unspecified whether rheumatoid factor present [facility]) She was already given instructions about peri surgical management and when to resume tocilizumab. Following with rheumatologist.  Return if  symptoms worsen or fail to improve.   Bobbi Kozakiewicz G. Martinique, MD  [facility]. [place] office.

## 2021-06-08 NOTE — Patient Instructions (Signed)
A few things to remember from today's visit:   Pre-op evaluation  Rheumatoid arthritis involving multiple sites, unspecified whether rheumatoid factor present ([place])  Hypothyroidism, unspecified type  I will complete forms and note today and fax it to your surgeon. Tomorrow you can take your medication in the afternoon after surgery.  If you need refills please call your pharmacy. Do not use My Chart to request refills or for acute issues that need immediate attention.   Please be sure medication list is accurate. If a new problem present, please set up appointment sooner than planned today.

## 2021-06-09 ENCOUNTER — Ambulatory Visit (HOSPITAL_COMMUNITY): Payer: Medicare Other

## 2021-06-09 ENCOUNTER — Encounter (HOSPITAL_COMMUNITY): Payer: Self-pay | Admitting: Orthopedic Surgery

## 2021-06-09 ENCOUNTER — Encounter (HOSPITAL_COMMUNITY)
Admission: RE | Disposition: A | Discharge status: Home / Self Care | Payer: Self-pay | Source: Home / Self Care | Attending: Orthopedic Surgery

## 2021-06-09 ENCOUNTER — Observation Stay (HOSPITAL_COMMUNITY)
Admission: RE | Admit: 2021-06-09 | Discharge: 2021-06-10 | Disposition: A | Discharge status: Home / Self Care | Payer: Medicare Other | Attending: Orthopedic Surgery | Admitting: Orthopedic Surgery

## 2021-06-09 ENCOUNTER — Observation Stay (HOSPITAL_COMMUNITY): Payer: Medicare Other

## 2021-06-09 ENCOUNTER — Ambulatory Visit (HOSPITAL_COMMUNITY): Payer: Medicare Other | Admitting: Physician Assistant

## 2021-06-09 DIAGNOSIS — Z96612 Presence of left artificial shoulder joint: Secondary | ICD-10-CM | POA: Diagnosis not present

## 2021-06-09 DIAGNOSIS — Z79899 Other long term (current) drug therapy: Secondary | ICD-10-CM | POA: Insufficient documentation

## 2021-06-09 DIAGNOSIS — M06812 Other specified rheumatoid arthritis, left shoulder: Secondary | ICD-10-CM | POA: Diagnosis not present

## 2021-06-09 DIAGNOSIS — Z471 Aftercare following joint replacement surgery: Secondary | ICD-10-CM | POA: Diagnosis not present

## 2021-06-09 DIAGNOSIS — Z79891 Long term (current) use of opiate analgesic: Secondary | ICD-10-CM | POA: Diagnosis not present

## 2021-06-09 DIAGNOSIS — Z96611 Presence of right artificial shoulder joint: Secondary | ICD-10-CM | POA: Diagnosis not present

## 2021-06-09 DIAGNOSIS — F411 Generalized anxiety disorder: Secondary | ICD-10-CM | POA: Diagnosis not present

## 2021-06-09 DIAGNOSIS — Z01811 Encounter for preprocedural respiratory examination: Secondary | ICD-10-CM

## 2021-06-09 DIAGNOSIS — E039 Hypothyroidism, unspecified: Secondary | ICD-10-CM | POA: Diagnosis not present

## 2021-06-09 DIAGNOSIS — E785 Hyperlipidemia, unspecified: Secondary | ICD-10-CM | POA: Diagnosis not present

## 2021-06-09 DIAGNOSIS — Z96642 Presence of left artificial hip joint: Secondary | ICD-10-CM | POA: Diagnosis not present

## 2021-06-09 DIAGNOSIS — G8918 Other acute postprocedural pain: Secondary | ICD-10-CM | POA: Diagnosis not present

## 2021-06-09 HISTORY — PX: REVERSE SHOULDER ARTHROPLASTY: SHX5054

## 2021-06-09 HISTORY — PX: SHOULDER SURGERY: SHX246

## 2021-06-09 LAB — TYPE AND SCREEN
ABO/RH(D): B POS
Antibody Screen: NEGATIVE

## 2021-06-09 SURGERY — ARTHROPLASTY, SHOULDER, TOTAL, REVERSE
Anesthesia: General | Site: Shoulder | Laterality: Left

## 2021-06-09 MED ORDER — ONDANSETRON HCL 4 MG/2ML IJ SOLN: 4.0000 mg | Freq: Once | INTRAMUSCULAR | Status: DC | PRN

## 2021-06-09 MED ORDER — METHOCARBAMOL 500 MG IVPB - SIMPLE MED
500.0000 mg | Freq: Four times a day (QID) | INTRAVENOUS | Status: DC | PRN
  Filled 2021-06-09: qty 50

## 2021-06-09 MED ORDER — HYDROXYZINE HCL 25 MG PO TABS: 25.0000 mg | ORAL_TABLET | Freq: Every day | ORAL | Status: DC | PRN

## 2021-06-09 MED ORDER — ROCURONIUM BROMIDE 10 MG/ML (PF) SYRINGE
PREFILLED_SYRINGE | INTRAVENOUS | Status: DC | PRN
  Administered 2021-06-09: 60 mg via INTRAVENOUS

## 2021-06-09 MED ORDER — SODIUM CHLORIDE 0.9 % IV SOLN: INTRAVENOUS | Status: DC

## 2021-06-09 MED ORDER — FLUTICASONE PROPIONATE 50 MCG/ACT NA SUSP
2.0000 | Freq: Every day | NASAL | Status: DC
  Administered 2021-06-09: 2 via NASAL
  Filled 2021-06-09 (×2): qty 16

## 2021-06-09 MED ORDER — DOCUSATE SODIUM 100 MG PO CAPS
100.0000 mg | ORAL_CAPSULE | Freq: Two times a day (BID) | ORAL | Status: DC
  Administered 2021-06-09 – 2021-06-10 (×2): 100 mg via ORAL
  Filled 2021-06-09 (×3): qty 1

## 2021-06-09 MED ORDER — GABAPENTIN 300 MG PO CAPS
300.0000 mg | ORAL_CAPSULE | Freq: Two times a day (BID) | ORAL | Status: DC
  Administered 2021-06-09 – 2021-06-10 (×2): 300 mg via ORAL
  Filled 2021-06-09 (×2): qty 1

## 2021-06-09 MED ORDER — SUGAMMADEX SODIUM 200 MG/2ML IV SOLN
INTRAVENOUS | Status: DC | PRN
  Administered 2021-06-09: 200 mg via INTRAVENOUS

## 2021-06-09 MED ORDER — LIDOCAINE 2% (20 MG/ML) 5 ML SYRINGE
INTRAMUSCULAR | Status: DC | PRN
  Administered 2021-06-09: 100 mg via INTRAVENOUS

## 2021-06-09 MED ORDER — METOCLOPRAMIDE HCL 5 MG PO TABS: 5.0000 mg | ORAL_TABLET | Freq: Three times a day (TID) | ORAL | Status: DC | PRN

## 2021-06-09 MED ORDER — PHENOL 1.4 % MT LIQD: 1.0000 | OROMUCOSAL | Status: DC | PRN

## 2021-06-09 MED ORDER — ORAL CARE MOUTH RINSE: 15.0000 mL | Freq: Once | OROMUCOSAL | Status: AC

## 2021-06-09 MED ORDER — PHENYLEPHRINE HCL-NACL 20-0.9 MG/250ML-% IV SOLN
INTRAVENOUS | Status: DC | PRN
  Administered 2021-06-09: 30 ug/min via INTRAVENOUS

## 2021-06-09 MED ORDER — HYDROCODONE-ACETAMINOPHEN 7.5-325 MG PO TABS: 1.0000 | ORAL_TABLET | ORAL | Status: DC | PRN

## 2021-06-09 MED ORDER — FENTANYL CITRATE PF 50 MCG/ML IJ SOSY: 25.0000 ug | PREFILLED_SYRINGE | INTRAMUSCULAR | Status: DC | PRN

## 2021-06-09 MED ORDER — CEFAZOLIN SODIUM-DEXTROSE 2-4 GM/100ML-% IV SOLN
2.0000 g | INTRAVENOUS | Status: AC
  Administered 2021-06-09: 2 g via INTRAVENOUS
  Filled 2021-06-09: qty 100

## 2021-06-09 MED ORDER — LINACLOTIDE 145 MCG PO CAPS
290.0000 ug | ORAL_CAPSULE | Freq: Every day | ORAL | Status: DC
  Filled 2021-06-09: qty 2

## 2021-06-09 MED ORDER — DEXAMETHASONE SODIUM PHOSPHATE 10 MG/ML IJ SOLN
INTRAMUSCULAR | Status: DC | PRN
  Administered 2021-06-09: 5 mg via INTRAVENOUS

## 2021-06-09 MED ORDER — PROPOFOL 10 MG/ML IV BOLUS
INTRAVENOUS | Status: DC | PRN
  Administered 2021-06-09: 70 mg via INTRAVENOUS

## 2021-06-09 MED ORDER — PHENYLEPHRINE 40 MCG/ML (10ML) SYRINGE FOR IV PUSH (FOR BLOOD PRESSURE SUPPORT)
PREFILLED_SYRINGE | INTRAVENOUS | Status: DC | PRN
  Administered 2021-06-09 (×2): 80 ug via INTRAVENOUS

## 2021-06-09 MED ORDER — HYDROCODONE-ACETAMINOPHEN 5-325 MG PO TABS
1.0000 | ORAL_TABLET | ORAL | Status: DC | PRN
  Administered 2021-06-09: 1 via ORAL
  Filled 2021-06-09: qty 1

## 2021-06-09 MED ORDER — OXYCODONE HCL 5 MG PO TABS: 5.0000 mg | ORAL_TABLET | Freq: Once | ORAL | Status: DC | PRN

## 2021-06-09 MED ORDER — ONDANSETRON HCL 4 MG/2ML IJ SOLN
INTRAMUSCULAR | Status: AC
  Filled 2021-06-09: qty 2

## 2021-06-09 MED ORDER — MELATONIN 5 MG PO TABS
10.0000 mg | ORAL_TABLET | Freq: Every evening | ORAL | Status: DC | PRN
  Administered 2021-06-09: 10 mg via ORAL
  Filled 2021-06-09: qty 2

## 2021-06-09 MED ORDER — BISACODYL 5 MG PO TBEC
5.0000 mg | DELAYED_RELEASE_TABLET | Freq: Every day | ORAL | Status: DC | PRN
  Administered 2021-06-10: 5 mg via ORAL
  Filled 2021-06-09: qty 1

## 2021-06-09 MED ORDER — ONDANSETRON HCL 4 MG/2ML IJ SOLN: 4.0000 mg | Freq: Four times a day (QID) | INTRAMUSCULAR | Status: DC | PRN

## 2021-06-09 MED ORDER — ACETAMINOPHEN 325 MG PO TABS: 325.0000 mg | ORAL_TABLET | Freq: Four times a day (QID) | ORAL | Status: DC | PRN

## 2021-06-09 MED ORDER — MENTHOL 3 MG MT LOZG: 1.0000 | LOZENGE | OROMUCOSAL | Status: DC | PRN

## 2021-06-09 MED ORDER — METHOCARBAMOL 500 MG PO TABS: 500.0000 mg | ORAL_TABLET | Freq: Four times a day (QID) | ORAL | Status: DC | PRN

## 2021-06-09 MED ORDER — SERTRALINE HCL 50 MG PO TABS
150.0000 mg | ORAL_TABLET | Freq: Every day | ORAL | Status: DC
  Administered 2021-06-10: 150 mg via ORAL
  Filled 2021-06-09: qty 1

## 2021-06-09 MED ORDER — BUPIVACAINE LIPOSOME 1.3 % IJ SUSP
INTRAMUSCULAR | Status: DC | PRN
  Administered 2021-06-09: 10 mL via PERINEURAL

## 2021-06-09 MED ORDER — PHENYLEPHRINE HCL (PRESSORS) 10 MG/ML IV SOLN
INTRAVENOUS | Status: AC
  Filled 2021-06-09: qty 2

## 2021-06-09 MED ORDER — FLEET ENEMA 7-19 GM/118ML RE ENEM: 1.0000 | ENEMA | Freq: Once | RECTAL | Status: DC | PRN

## 2021-06-09 MED ORDER — FENTANYL CITRATE (PF) 100 MCG/2ML IJ SOLN
INTRAMUSCULAR | Status: AC
  Filled 2021-06-09: qty 2

## 2021-06-09 MED ORDER — SODIUM CHLORIDE 0.9 % IR SOLN
Status: DC | PRN
  Administered 2021-06-09: 1000 mL

## 2021-06-09 MED ORDER — LORATADINE 10 MG PO TABS
10.0000 mg | ORAL_TABLET | Freq: Every day | ORAL | Status: DC
  Administered 2021-06-10: 10 mg via ORAL
  Filled 2021-06-09: qty 1

## 2021-06-09 MED ORDER — TEMAZEPAM 15 MG PO CAPS
15.0000 mg | ORAL_CAPSULE | Freq: Every evening | ORAL | Status: DC | PRN
Start: 2021-06-09 — End: 2021-06-10
  Administered 2021-06-09: 15 mg via ORAL
  Filled 2021-06-09: qty 1

## 2021-06-09 MED ORDER — DEXAMETHASONE SODIUM PHOSPHATE 10 MG/ML IJ SOLN
INTRAMUSCULAR | Status: AC
  Filled 2021-06-09: qty 1

## 2021-06-09 MED ORDER — DIPHENHYDRAMINE HCL 12.5 MG/5ML PO ELIX: 12.5000 mg | ORAL_SOLUTION | ORAL | Status: DC | PRN

## 2021-06-09 MED ORDER — OXYCODONE HCL 5 MG/5ML PO SOLN: 5.0000 mg | Freq: Once | ORAL | Status: DC | PRN

## 2021-06-09 MED ORDER — ATORVASTATIN CALCIUM 20 MG PO TABS
20.0000 mg | ORAL_TABLET | Freq: Every day | ORAL | Status: DC
  Administered 2021-06-09: 20 mg via ORAL
  Filled 2021-06-09: qty 1

## 2021-06-09 MED ORDER — ASPIRIN 81 MG PO CHEW
81.0000 mg | CHEWABLE_TABLET | Freq: Every day | ORAL | Status: DC
  Administered 2021-06-10: 81 mg via ORAL
  Filled 2021-06-09 (×2): qty 1

## 2021-06-09 MED ORDER — FENTANYL CITRATE PF 50 MCG/ML IJ SOSY
PREFILLED_SYRINGE | INTRAMUSCULAR | Status: AC
  Filled 2021-06-09: qty 1

## 2021-06-09 MED ORDER — MIDAZOLAM HCL 2 MG/2ML IJ SOLN
INTRAMUSCULAR | Status: AC
  Filled 2021-06-09: qty 2

## 2021-06-09 MED ORDER — POLYETHYLENE GLYCOL 3350 17 G PO PACK
17.0000 g | PACK | Freq: Every day | ORAL | Status: DC | PRN
  Administered 2021-06-10: 17 g via ORAL
  Filled 2021-06-09: qty 1

## 2021-06-09 MED ORDER — LEVOTHYROXINE SODIUM 112 MCG PO TABS
112.0000 ug | ORAL_TABLET | Freq: Every day | ORAL | Status: DC
  Administered 2021-06-10: 112 ug via ORAL
  Filled 2021-06-09: qty 1

## 2021-06-09 MED ORDER — ONDANSETRON HCL 4 MG/2ML IJ SOLN
INTRAMUSCULAR | Status: DC | PRN
  Administered 2021-06-09: 4 mg via INTRAVENOUS

## 2021-06-09 MED ORDER — WATER FOR IRRIGATION, STERILE IR SOLN
Status: DC | PRN
  Administered 2021-06-09: 1000 mL

## 2021-06-09 MED ORDER — BUPIVACAINE HCL (PF) 0.5 % IJ SOLN
INTRAMUSCULAR | Status: DC | PRN
  Administered 2021-06-09: 15 mL via PERINEURAL

## 2021-06-09 MED ORDER — PANTOPRAZOLE SODIUM 40 MG PO TBEC
40.0000 mg | DELAYED_RELEASE_TABLET | Freq: Every day | ORAL | Status: DC
  Administered 2021-06-10: 40 mg via ORAL
  Filled 2021-06-09: qty 1

## 2021-06-09 MED ORDER — CHLORHEXIDINE GLUCONATE 0.12 % MT SOLN
15.0000 mL | Freq: Once | OROMUCOSAL | Status: AC
  Administered 2021-06-09: 15 mL via OROMUCOSAL

## 2021-06-09 MED ORDER — LIDOCAINE 2% (20 MG/ML) 5 ML SYRINGE
INTRAMUSCULAR | Status: AC
  Filled 2021-06-09: qty 5

## 2021-06-09 MED ORDER — METOCLOPRAMIDE HCL 5 MG/ML IJ SOLN: 5.0000 mg | Freq: Three times a day (TID) | INTRAMUSCULAR | Status: DC | PRN

## 2021-06-09 MED ORDER — ONDANSETRON HCL 4 MG PO TABS: 4.0000 mg | ORAL_TABLET | Freq: Four times a day (QID) | ORAL | Status: DC | PRN

## 2021-06-09 MED ORDER — FENTANYL CITRATE (PF) 100 MCG/2ML IJ SOLN
INTRAMUSCULAR | Status: DC | PRN
  Administered 2021-06-09: 75 ug via INTRAVENOUS

## 2021-06-09 MED ORDER — PROPOFOL 10 MG/ML IV BOLUS
INTRAVENOUS | Status: AC
  Filled 2021-06-09: qty 40

## 2021-06-09 MED ORDER — TRANEXAMIC ACID-NACL 1000-0.7 MG/100ML-% IV SOLN
1000.0000 mg | INTRAVENOUS | Status: AC
  Administered 2021-06-09: 1000 mg via INTRAVENOUS
  Filled 2021-06-09: qty 100

## 2021-06-09 MED ORDER — ACETAMINOPHEN 500 MG PO TABS
500.0000 mg | ORAL_TABLET | Freq: Four times a day (QID) | ORAL | Status: AC
Start: 2021-06-09 — End: 2021-06-10
  Administered 2021-06-09 – 2021-06-10 (×4): 500 mg via ORAL
  Filled 2021-06-09 (×4): qty 1

## 2021-06-09 MED ORDER — 0.9 % SODIUM CHLORIDE (POUR BTL) OPTIME
TOPICAL | Status: DC | PRN
  Administered 2021-06-09: 1000 mL

## 2021-06-09 MED ORDER — LACTATED RINGERS IV SOLN: INTRAVENOUS | Status: DC

## 2021-06-09 MED ORDER — MAGNESIUM OXIDE 400 MG PO TABS
400.0000 mg | ORAL_TABLET | Freq: Every day | ORAL | Status: DC
  Administered 2021-06-09: 400 mg via ORAL
  Filled 2021-06-09 (×2): qty 1

## 2021-06-09 MED ORDER — ROCURONIUM BROMIDE 10 MG/ML (PF) SYRINGE
PREFILLED_SYRINGE | INTRAVENOUS | Status: AC
  Filled 2021-06-09: qty 10

## 2021-06-09 MED ORDER — MORPHINE SULFATE (PF) 2 MG/ML IV SOLN: 0.5000 mg | INTRAVENOUS | Status: DC | PRN

## 2021-06-09 MED ORDER — DROPERIDOL 2.5 MG/ML IJ SOLN
INTRAMUSCULAR | Status: DC | PRN
  Administered 2021-06-09: .625 mg via INTRAVENOUS

## 2021-06-09 MED ORDER — EPHEDRINE SULFATE-NACL 50-0.9 MG/10ML-% IV SOSY
PREFILLED_SYRINGE | INTRAVENOUS | Status: DC | PRN
  Administered 2021-06-09: 10 mg via INTRAVENOUS

## 2021-06-09 SURGICAL SUPPLY — 75 items
BAG COUNTER SPONGE SURGICOUNT (BAG) IMPLANT
BAG ZIPLOCK 12X15 (MISCELLANEOUS) ×2 IMPLANT
BASEPLATE P2 COATD GLND 6.5X30 (Shoulder) ×1 IMPLANT
BIT DRILL 1.6MX128 (BIT) IMPLANT
BIT DRILL 2.5 DIA 127 CALI (BIT) ×2 IMPLANT
BIT DRILL 4 DIA CALIBRATED (BIT) ×2 IMPLANT
BLADE SAW SAG 73X25 THK (BLADE) ×1
BLADE SAW SGTL 73X25 THK (BLADE) ×1 IMPLANT
BOOTIES KNEE HIGH SLOAN (MISCELLANEOUS) ×4 IMPLANT
COOLER ICEMAN CLASSIC (MISCELLANEOUS) ×2 IMPLANT
COVER BACK TABLE 60X90IN (DRAPES) ×2 IMPLANT
COVER SURGICAL LIGHT HANDLE (MISCELLANEOUS) ×2 IMPLANT
DRAPE INCISE IOBAN 66X45 STRL (DRAPES) ×2 IMPLANT
DRAPE ORTHO SPLIT 77X108 STRL (DRAPES) ×2
DRAPE POUCH INSTRU U-SHP 10X18 (DRAPES) ×2 IMPLANT
DRAPE SHEET LG 3/4 BI-LAMINATE (DRAPES) ×2 IMPLANT
DRAPE SURG 17X11 SM STRL (DRAPES) ×2 IMPLANT
DRAPE SURG ORHT 6 SPLT 77X108 (DRAPES) ×2 IMPLANT
DRAPE TOP 10253 STERILE (DRAPES) ×2 IMPLANT
DRAPE U-SHAPE 47X51 STRL (DRAPES) ×2 IMPLANT
DRSG AQUACEL AG ADV 3.5X 6 (GAUZE/BANDAGES/DRESSINGS) ×2 IMPLANT
DURAPREP 26ML APPLICATOR (WOUND CARE) ×4 IMPLANT
ELECT BLADE TIP CTD 4 INCH (ELECTRODE) ×2 IMPLANT
ELECT REM PT RETURN 15FT ADLT (MISCELLANEOUS) ×2 IMPLANT
GLOVE SRG 8 PF TXTR STRL LF DI (GLOVE) ×1 IMPLANT
GLOVE SURG ENC MOIS LTX SZ6.5 (GLOVE) ×2 IMPLANT
GLOVE SURG ENC MOIS LTX SZ7 (GLOVE) ×2 IMPLANT
GLOVE SURG ENC MOIS LTX SZ7.5 (GLOVE) ×2 IMPLANT
GLOVE SURG POLY ORTHO LF SZ7.5 (GLOVE) ×2 IMPLANT
GLOVE SURG UNDER POLY LF SZ6.5 (GLOVE) ×2 IMPLANT
GLOVE SURG UNDER POLY LF SZ7 (GLOVE) ×10 IMPLANT
GLOVE SURG UNDER POLY LF SZ8 (GLOVE) ×1
GOWN STRL REUS W/TWL LRG LVL3 (GOWN DISPOSABLE) ×2 IMPLANT
GOWN STRL REUS W/TWL XL LVL3 (GOWN DISPOSABLE) ×6 IMPLANT
HANDPIECE INTERPULSE COAX TIP (DISPOSABLE) ×1
HOOD PEEL AWAY FLYTE STAYCOOL (MISCELLANEOUS) ×6 IMPLANT
HUMERA STEM SM SHELL SHOU 10 (Miscellaneous) ×2 IMPLANT
INSERT SMALL SOCKET 32MM NEU (Insert) ×2 IMPLANT
KIT BASIN OR (CUSTOM PROCEDURE TRAY) ×2 IMPLANT
KIT TURNOVER KIT A (KITS) ×2 IMPLANT
MANIFOLD NEPTUNE II (INSTRUMENTS) ×2 IMPLANT
NEEDLE TROCAR POINT SZ 2 1/2 (NEEDLE) IMPLANT
NS IRRIG 1000ML POUR BTL (IV SOLUTION) ×2 IMPLANT
P2 COATDE GLNOID BSEPLT 6.5X30 (Shoulder) ×2 IMPLANT
PACK SHOULDER (CUSTOM PROCEDURE TRAY) ×2 IMPLANT
PAD COLD SHLDR WRAP-ON (PAD) IMPLANT
PROTECTOR NERVE ULNAR (MISCELLANEOUS) IMPLANT
RESTRAINT HEAD UNIVERSAL NS (MISCELLANEOUS) IMPLANT
RETRIEVER SUT HEWSON (MISCELLANEOUS) IMPLANT
SCREW BONE LOCKING RSP 5.0X14 (Screw) ×4 IMPLANT
SCREW BONE LOCKING RSP 5.0X30 (Screw) ×4 IMPLANT
SCREW BONE RSP LOCK 5X14 (Screw) ×2 IMPLANT
SCREW BONE RSP LOCK 5X30 (Screw) ×2 IMPLANT
SCREW RETAIN W/HEAD 4MM OFFSET (Shoulder) ×2 IMPLANT
SET HNDPC FAN SPRY TIP SCT (DISPOSABLE) ×1 IMPLANT
SLING ARM IMMOBILIZER LRG (SOFTGOODS) IMPLANT
SLING ARM IMMOBILIZER MED (SOFTGOODS) ×2 IMPLANT
SPONGE T-LAP 18X18 ~~LOC~~+RFID (SPONGE) ×2 IMPLANT
SPONGE T-LAP 4X18 ~~LOC~~+RFID (SPONGE) ×2 IMPLANT
STEM HUMERAL SM SHELL SHOU 10 (Miscellaneous) ×1 IMPLANT
STRIP CLOSURE SKIN 1/2X4 (GAUZE/BANDAGES/DRESSINGS) ×2 IMPLANT
SUCTION FRAZIER HANDLE 10FR (MISCELLANEOUS)
SUCTION TUBE FRAZIER 10FR DISP (MISCELLANEOUS) IMPLANT
SUPPORT WRAP ARM LG (MISCELLANEOUS) IMPLANT
SUT ETHIBOND 2 V 37 (SUTURE) ×2 IMPLANT
SUT FIBERWIRE #2 38 REV NDL BL (SUTURE) ×2
SUT MNCRL AB 4-0 PS2 18 (SUTURE) ×2 IMPLANT
SUT VIC AB 2-0 CT1 27 (SUTURE) ×2
SUT VIC AB 2-0 CT1 TAPERPNT 27 (SUTURE) ×2 IMPLANT
SUTURE FIBERWR#2 38 REV NDL BL (SUTURE) ×1 IMPLANT
TAPE LABRALWHITE 1.5X36 (TAPE) IMPLANT
TAPE SUT LABRALTAP WHT/BLK (SUTURE) IMPLANT
TOWEL OR 17X26 10 PK STRL BLUE (TOWEL DISPOSABLE) ×2 IMPLANT
TOWEL OR NON WOVEN STRL DISP B (DISPOSABLE) ×2 IMPLANT
WATER STERILE IRR 1000ML POUR (IV SOLUTION) ×2 IMPLANT

## 2021-06-09 NOTE — Anesthesia Postprocedure Evaluation (Signed)
Anesthesia Post Note  Patient: Jennifer Hull  Procedure(s) Performed: REVERSE SHOULDER ARTHROPLASTY (Left: Shoulder)     Patient location during evaluation: PACU Anesthesia Type: General Level of consciousness: awake and alert Pain management: pain level controlled Vital Signs Assessment: post-procedure vital signs reviewed and stable Respiratory status: spontaneous breathing, nonlabored ventilation, respiratory function stable and patient connected to nasal cannula oxygen Cardiovascular status: blood pressure returned to baseline and stable Postop Assessment: no apparent nausea or vomiting Anesthetic complications: no   No notable events documented.  Last Vitals:  Vitals:   06/09/21 0945 06/09/21 1000  BP: (!) 149/63 138/63  Pulse: 60 (!) 57  Resp: 14 20  Temp:  36.4 C  SpO2: 100% 96%    Last Pain:  Vitals:   06/09/21 1000  TempSrc:   PainSc: 0-No pain                 Yukio Bisping S

## 2021-06-09 NOTE — Anesthesia Procedure Notes (Signed)
Anesthesia Regional Block: Interscalene brachial plexus block   Pre-Anesthetic Checklist: , timeout performed,  Correct Patient, Correct Site, Correct Laterality,  Correct Procedure, Correct Position, site marked,  Risks and benefits discussed,  Surgical consent,  Pre-op evaluation,  At surgeon's request and post-op pain management  Laterality: Left  Prep: chloraprep       Needles:  Injection technique: Single-shot  Needle Type: Echogenic Needle     Needle Length: 9cm      Additional Needles:   Procedures:,,,, ultrasound used (permanent image in chart),,    Narrative:  Start time: 06/09/2021 7:07 AM End time: 06/09/2021 7:14 AM Injection made incrementally with aspirations every 5 mL.  Performed by: Personally  Anesthesiologist: Myrtie Soman, MD  Additional Notes: Patient tolerated the procedure well without complications

## 2021-06-09 NOTE — Discharge Instructions (Signed)
Discharge Instructions after Reverse Total Shoulder Arthroplasty   . A sling has been provided for you. You are to wear this at all times (except for bathing and dressing), until your first post operative visit with Dr. Chandler. Please also wear while sleeping at night. While you bath and dress, let the arm/elbow extend straight down to stretch your elbow. Wiggle your fingers and pump your first while your in the sling to prevent hand swelling. . Use ice on the shoulder intermittently over the first 48 hours after surgery. Continue to use ice or and ice machine as needed after 48 hours for pain control/swelling.  . Pain medicine has been prescribed for you.  . Use your medicine liberally over the first 48 hours, and then you can begin to taper your use. You may take Extra Strength Tylenol or Tylenol only in place of the pain pills. DO NOT take ANY nonsteroidal anti-inflammatory pain medications: Advil, Motrin, Ibuprofen, Aleve, Naproxen or Naprosyn.  . Take one aspirin a day for 2 weeks after surgery, unless you have an aspirin sensitivity/allergy or asthma.  . Leave your dressing on until your first follow up visit.  You may shower with the dressing.  Hold your arm as if you still have your sling on while you shower. . Simply allow the water to wash over the site and then pat dry. Make sure your axilla (armpit) is completely dry after showering.    Please call [phone] during normal business hours or [phone] after hours for any problems. Including the following:  - excessive redness of the incisions - drainage for more than 4 days - fever of more than 101.5 F  *Please note that pain medications will not be refilled after hours or on weekends.     

## 2021-06-09 NOTE — Anesthesia Procedure Notes (Signed)
Procedure Name: Intubation Date/Time: 06/09/2021 7:42 AM Performed by: Niel Hummer, CRNA Pre-anesthesia Checklist: Patient identified, Emergency Drugs available, Suction available and Patient being monitored Patient Re-evaluated:Patient Re-evaluated prior to induction Oxygen Delivery Method: Circle system utilized Preoxygenation: Pre-oxygenation with 100% oxygen Induction Type: IV induction Ventilation: Mask ventilation without difficulty Laryngoscope Size: Mac and 3 Grade View: Grade I Tube type: Oral Tube size: 7.0 mm Number of attempts: 1 Airway Equipment and Method: Stylet Placement Confirmation: ETT inserted through vocal cords under direct vision, positive ETCO2 and breath sounds checked- equal and bilateral Secured at: 22 cm Tube secured with: Tape Dental Injury: Teeth and Oropharynx as per pre-operative assessment  Comments: Performed by Loura Pardon

## 2021-06-09 NOTE — Anesthesia Procedure Notes (Signed)
Anesthesia Procedure Image    

## 2021-06-09 NOTE — Anesthesia Preprocedure Evaluation (Signed)
Anesthesia Evaluation  Patient identified by MRN, date of birth, ID band Patient awake    Reviewed: Allergy & Precautions, NPO status , Patient's Chart, lab work & pertinent test results  Airway Mallampati: II  TM Distance: >3 FB Neck ROM: Full    Dental no notable dental hx.    Pulmonary neg pulmonary ROS, former smoker,    Pulmonary exam normal breath sounds clear to auscultation       Cardiovascular negative cardio ROS Normal cardiovascular exam Rhythm:Regular Rate:Normal     Neuro/Psych negative neurological ROS  negative psych ROS   GI/Hepatic Neg liver ROS, GERD  ,  Endo/Other  Hypothyroidism   Renal/GU negative Renal ROS  negative genitourinary   Musculoskeletal  (+) Arthritis , Osteoarthritis,    Abdominal   Peds negative pediatric ROS (+)  Hematology negative hematology ROS (+)   Anesthesia Other Findings   Reproductive/Obstetrics negative OB ROS                             Anesthesia Physical Anesthesia Plan  ASA: 2  Anesthesia Plan: General   Post-op Pain Management:  Regional for Post-op pain   Induction: Intravenous  PONV Risk Score and Plan: 3 and Ondansetron, Dexamethasone and Treatment may vary due to age or medical condition  Airway Management Planned: Oral ETT  Additional Equipment:   Intra-op Plan:   Post-operative Plan: Extubation in OR  Informed Consent: I have reviewed the patients History and Physical, chart, labs and discussed the procedure including the risks, benefits and alternatives for the proposed anesthesia with the patient or authorized representative who has indicated his/her understanding and acceptance.     Dental advisory given  Plan Discussed with: CRNA and Surgeon  Anesthesia Plan Comments:         Anesthesia Quick Evaluation

## 2021-06-09 NOTE — Plan of Care (Signed)
  Problem: Clinical Measurements: Goal: Will remain free from infection Outcome: Progressing   Problem: Pain Managment: Goal: General experience of comfort will improve Outcome: Progressing   Problem: Skin Integrity: Goal: Risk for impaired skin integrity will decrease Outcome: Progressing   

## 2021-06-09 NOTE — H&P (Signed)
Analyah Stutts Hull is an 80 y.o. female.   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  Pain interferes with sleep and quality of life.   Past Medical History:  Diagnosis Date   Anxiety    Arthritis    Chronic constipation    Depression    Hyperlipidemia    Hypothyroidism    Pneumonia    Rheumatoid arthritis ([place]) 2019   Temporal arteritis ([place])    Thyroid disease     Past Surgical History:  Procedure Laterality Date   ABDOMINAL HYSTERECTOMY     BREAST EXCISIONAL BIOPSY Left    BREAST SURGERY     biopsy   COLONOSCOPY     TEMPORAL ARTERY BIOPSY / LIGATION Bilateral 2014   TONSILLECTOMY     TOTAL SHOULDER ARTHROPLASTY Right 11/22/2017   Procedure: RIGHT REVERSE TOTAL SHOULDER ARTHROPLASTY;  Surgeon: Tania Ade, MD;  Location: [facility];  Service: Orthopedics;  Laterality: Right;    Family History  Problem Relation Age of Onset   Kidney cancer Mother    Heart disease Father    Breast cancer Maternal Aunt    Colon cancer Neg Hx    Esophageal cancer Neg Hx    Rectal cancer Neg Hx    Stomach cancer Neg Hx    Social History:  reports that she quit smoking about 44 years ago. Her smoking use included cigarettes. She has a 12.00 pack-year smoking history. She has never used smokeless tobacco. She reports that she does not drink alcohol and does not use drugs.  Allergies:  Allergies  Allergen Reactions   Tdap [Tetanus-Diphth-Acell Pertussis] Other (See Comments)    SEVERE ALLERGIC REACTIONS (NECK STIFFNESS/HIGH FEVER/ETC)   Tetanus Toxoid, Adsorbed     Neck stiffness, and high fever Other reaction(s): Other (See Comments) Neck stiffness, and high fever   Hydroxychloroquine Sulfate Rash   Covid-19 (Mrna) Vaccine Therapist, music) [Covid-19 (Mrna) Vaccine] Rash    Medications Prior to Admission  Medication Sig Dispense Refill   acetaminophen (TYLENOL) 500 MG  tablet Take 1,000 mg by mouth every 8 (eight) hours as needed for moderate pain.     atorvastatin (LIPITOR) 20 MG tablet Take 1 tablet by mouth once daily (Patient taking differently: Take 20 mg by mouth at bedtime.) 90 tablet 3   Calcium Carb-Cholecalciferol (CALCIUM 600+D3 PO) Take 1 tablet by mouth daily with lunch.     cholecalciferol (VITAMIN D3) 25 MCG (1000 UNIT) tablet Take 1,000 Units by mouth daily.     EUTHYROX 112 MCG tablet TAKE 1 TABLET BY MOUTH ONCE DAILY BEFORE BREAKFAST (Patient taking differently: Take 112 mcg by mouth daily before breakfast.) 90 tablet 2   Flaxseed, Linseed, (FLAXSEED OIL PO) Take 3 g by mouth daily.     fluticasone (FLONASE) 50 MCG/ACT nasal spray Place 2 sprays into both nostrils at bedtime.     folic acid (FOLVITE) [id number] MCG tablet Take 800 mcg by mouth daily.     gabapentin (NEURONTIN) 300 MG capsule Take 1 capsule (300 mg total) by mouth 3 (three) times daily. (Patient taking differently: Take 1 capsule by mouth 2 (two) times daily.) 90 capsule 3   hydrOXYzine (ATARAX/VISTARIL) 25 MG tablet Take 1 tablet (25 mg total) by mouth daily as needed. 30 tablet 2   linaclotide (LINZESS) 290 MCG CAPS capsule Take 1 capsule (290 mcg total) by mouth daily before breakfast. 30 capsule 3   loratadine (CLARITIN) 10 MG  tablet Take 1 tablet (10 mg total) by mouth daily. 30 tablet 11   magnesium oxide (MAG-OX) 400 MG tablet Take 400 mg by mouth daily.     Melatonin 10 MG CAPS Take 10 mg by mouth at bedtime.     meloxicam (MOBIC) 7.5 MG tablet Take 7.5 mg by mouth daily as needed for pain.     Omega-3 Fatty Acids (FISH OIL) 1000 MG CAPS Take 1,000 mg by mouth daily.     pantoprazole (PROTONIX) 40 MG tablet Take 1 tablet (40 mg total) by mouth daily. 90 tablet 3   sertraline (ZOLOFT) 100 MG tablet TAKE 1 & 1/2 (ONE & ONE-HALF) TABLETS BY MOUTH ONCE DAILY (Patient taking differently: Take 150 mg by mouth daily.) 135 tablet 0   simethicone (MYLICON) 0000000 MG chewable tablet Chew 250  mg by mouth once.     temazepam (RESTORIL) 15 MG capsule TAKE 1 CAPSULE BY MOUTH AT BEDTIME AS NEEDED FOR SLEEP (Patient taking differently: Take 15 mg by mouth at bedtime as needed for sleep. TAKE 1 CAPSULE BY MOUTH AT BEDTIME AS NEEDED FOR SLEEP) 30 capsule 2   TURMERIC CURCUMIN PO Take 3,000 mg by mouth daily.     zinc gluconate 50 MG tablet Take 50 mg by mouth daily.     Tocilizumab (ACTEMRA IV) Inject into the vein every 30 (thirty) days.      No results found for this or any previous visit (from the past 48 hour(s)). No results found.  Review of Systems  All other systems reviewed and are negative.  Blood pressure (!) 115/56, pulse 68, temperature 98.1 F (36.7 C), temperature source Oral, resp. rate 13, height '5\' 4"'$  (1.626 m), weight 60.4 kg, SpO2 98 %. Physical Exam HENT:     Head: Atraumatic.  Eyes:     Extraocular Movements: Extraocular movements intact.  Cardiovascular:     Pulses: Normal pulses.  Pulmonary:     Effort: Pulmonary effort is normal.  Musculoskeletal:     Comments: L shoulder pain with limited ROM. NVID.  Neurological:     Mental Status: She is alert.  Psychiatric:        Mood and Affect: Mood normal.     Assessment/Plan L shoulder endstage RA Plan L reverse TSA Risks / benefits of surgery discussed Consent on chart  NPO for OR Preop antibiotics    Rhae Hammock, MD 06/09/2021, 7:19 AM

## 2021-06-09 NOTE — Op Note (Signed)
Procedure(s): REVERSE SHOULDER ARTHROPLASTY Procedure Note  Jennifer Hull female 80 y.o. 06/09/2021  Preoperative diagnosis: Left shoulder end-stage rheumatoid arthritis  Postoperative diagnosis: Same  Procedure(s) and Anesthesia Type:    * REVERSE SHOULDER ARTHROPLASTY - General   Indications:  80 y.o. 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 Pain and dysfunction interfered with quality of life and nonoperative treatment with activity modification, NSAIDS and injections failed.     Surgeon: Rhae Hammock   Assistants: Sheryle Hail PA-C Amber was present and scrubbed throughout the procedure and was essential in positioning, retraction, exposure, and closure)  Anesthesia: General endotracheal anesthesia with preoperative interscalene block given by the attending anesthesiologist    Procedure Detail  REVERSE SHOULDER ARTHROPLASTY   Estimated Blood Loss:  200 mL         Drains: none  Blood Given: none          Specimens: none        Complications:  * No complications entered in OR log *         Disposition: PACU - hemodynamically stable.         Condition: stable      OPERATIVE FINDINGS:  A DJO Altivate pressfit reverse total shoulder arthroplasty was placed with a  size 10 stem, a 32-4 glenosphere, and a 32 standard-mm poly insert. The base plate  fixation was excellent.  PROCEDURE: The patient was identified in the preoperative holding area  where I personally marked the operative site after verifying site, side,  and procedure with the patient. An interscalene block given by  the attending anesthesiologist in the holding area and the patient was taken back to the operating room where all extremities were  carefully padded in position after general anesthesia was induced. She  was placed in a beach-chair position and the operative upper extremity was  prepped and draped in a standard sterile  fashion. An approximately 10-  cm incision was made from the tip of the coracoid process to the center  point of the humerus at the level of the axilla. Dissection was carried  down through subcutaneous tissues to the level of the cephalic vein  which was taken laterally with the deltoid. The pectoralis major was  retracted medially. The subdeltoid space was developed and the lateral  edge of the conjoined tendon was identified. The undersurface of  conjoined tendon was palpated and the musculocutaneous nerve was not in  the field. Retractor was placed underneath the conjoined and second  retractor was placed lateral into the deltoid. The circumflex humeral  artery and vessels were identified and clamped and coagulated. The  biceps tendon was tenodesed to the upper border of the pectoralis major.  The subscapularis was taken down as a peel with the underlying capsule.  The  joint was then gently externally rotated while the capsule was released  from the humeral neck around to just beyond the 6 o'clock position. At  this point, the joint was dislocated and the humeral head was presented  into the wound. The excessive osteophyte formation was removed with a  large rongeur.  The cutting guide was used to make the appropriate  head cut and the head was saved for potentially bone grafting.  The glenoid was exposed with the arm in an  abducted extended position. The anterior and posterior labrum were  completely excised and the capsule was released circumferentially to  allow for exposure of the glenoid for preparation.  The 2.5 mm drill was  placed using the guide in 5-10 inferior angulation and the tap was then advanced in the same hole. Small and large reamers were then used. The tap was then removed and the Metaglene was then screwed in with excellent purchase.  The peripheral guide was then used to drilled measured and filled peripheral locking screws. The size 32-4 glenosphere was then impacted  on the [facility] taper and the central screw was placed. The humerus was then again exposed and the diaphyseal reamers were used followed by the metaphyseal reamers. The final broach was left in place in the proximal trial was placed. The joint was reduced and with this implant it was felt that soft tissue tensioning was appropriate with excellent stability and excellent range of motion. Therefore, final humeral stem was placed press-fit.  And then the trial polyethylene inserts were tested again and the above implant was felt to be the most appropriate for final insertion. The joint was reduced taken through full range of motion and felt to be stable. Soft tissue tension was appropriate.  The joint was then copiously irrigated with pulse  lavage and the wound was then closed. The subscapularis was repaired with a #2 FiberWire through bone tunnels.  Skin was closed with 2-0 Vicryl in a deep dermal layer and 4-0  Monocryl for skin closure. Steri-Strips were applied. Sterile  dressings were then applied as well as a sling. The patient was allowed  to awaken from general anesthesia, transferred to stretcher, and taken  to recovery room in stable condition.   POSTOPERATIVE PLAN: The patient will be kept in the hospital postoperatively  for pain control and therapy and likely discharged in the morning.

## 2021-06-09 NOTE — Transfer of Care (Signed)
Immediate Anesthesia Transfer of Care Note  Patient: Jennifer Hull  Procedure(s) Performed: REVERSE SHOULDER ARTHROPLASTY (Left: Shoulder)  Patient Location: PACU  Anesthesia Type:General  Level of Consciousness: awake, alert  and oriented  Airway & Oxygen Therapy: Patient Spontanous Breathing and Patient connected to face mask oxygen  Post-op Assessment: Report given to RN and Post -op Vital signs reviewed and stable  Post vital signs: Reviewed and stable  Last Vitals:  Vitals Value Taken Time  BP 142/60   Temp    Pulse 66   Resp 19   SpO2 98     Last Pain:  Vitals:   06/09/21 0603  TempSrc:   PainSc: 0-No pain      Patients Stated Pain Goal: 3 ([id number])  Complications: No notable events documented.

## 2021-06-10 DIAGNOSIS — E039 Hypothyroidism, unspecified: Secondary | ICD-10-CM | POA: Diagnosis not present

## 2021-06-10 DIAGNOSIS — Z79899 Other long term (current) drug therapy: Secondary | ICD-10-CM | POA: Diagnosis not present

## 2021-06-10 DIAGNOSIS — Z96611 Presence of right artificial shoulder joint: Secondary | ICD-10-CM | POA: Diagnosis not present

## 2021-06-10 DIAGNOSIS — Z79891 Long term (current) use of opiate analgesic: Secondary | ICD-10-CM | POA: Diagnosis not present

## 2021-06-10 DIAGNOSIS — M06812 Other specified rheumatoid arthritis, left shoulder: Secondary | ICD-10-CM | POA: Diagnosis not present

## 2021-06-10 LAB — CBC
HCT: 32 % — ABNORMAL LOW (ref 36.0–46.0)
Hemoglobin: 11.1 g/dL — ABNORMAL LOW (ref 12.0–15.0)
MCH: 31.5 pg (ref 26.0–34.0)
MCHC: 34.7 g/dL (ref 30.0–36.0)
MCV: 90.9 fL (ref 80.0–100.0)
Platelets: 163 10*3/uL (ref 150–400)
RBC: 3.52 MIL/uL — ABNORMAL LOW (ref 3.87–5.11)
RDW: 12.9 % (ref 11.5–15.5)
WBC: 9 10*3/uL (ref 4.0–10.5)
nRBC: 0 % (ref 0.0–0.2)

## 2021-06-10 LAB — BASIC METABOLIC PANEL
Anion gap: 4 — ABNORMAL LOW (ref 5–15)
BUN: 17 mg/dL (ref 8–23)
CO2: 26 mmol/L (ref 22–32)
Calcium: 8.6 mg/dL — ABNORMAL LOW (ref 8.9–10.3)
Chloride: 108 mmol/L (ref 98–111)
Creatinine, Ser: 0.7 mg/dL (ref 0.44–1.00)
GFR, Estimated: >60 mL/min (ref >60)
Glucose, Bld: 90 mg/dL (ref 70–99)
Potassium: 3.6 mmol/L (ref 3.5–5.1)
Sodium: 138 mmol/L (ref 135–145)

## 2021-06-10 MED ORDER — METHOCARBAMOL 500 MG PO TABS: 500.0000 mg | ORAL_TABLET | Freq: Three times a day (TID) | ORAL | 0 refills | Status: DC | PRN

## 2021-06-10 MED ORDER — HYDROCODONE-ACETAMINOPHEN 5-325 MG PO TABS: 1.0000 | ORAL_TABLET | Freq: Four times a day (QID) | ORAL | 0 refills | Status: DC | PRN

## 2021-06-10 MED ORDER — ASPIRIN 81 MG PO CHEW: 81.0000 mg | CHEWABLE_TABLET | Freq: Every day | ORAL | 0 refills | Status: DC

## 2021-06-10 NOTE — Plan of Care (Signed)
  Problem: Pain Management: Goal: Pain level will decrease with appropriate interventions Outcome: Progressing   Problem: Education: Goal: Understanding of activity limitations/precautions following surgery will improve Outcome: Progressing   Problem: Education: Goal: Knowledge of the prescribed therapeutic regimen will improve Outcome: Progressing   Problem: Safety: Goal: Ability to remain free from injury will improve Outcome: Progressing

## 2021-06-10 NOTE — Progress Notes (Signed)
PATIENT ID: Jennifer Hull  MRN: [id number]  DOB/AGE:  05-16-1941 / 80 y.o.  1 Day Post-Op Procedure(s) (LRB): REVERSE SHOULDER ARTHROPLASTY (Left)  Subjective: Pain is mild.  No c/o chest pain or SOB. Starting to get some feeling and motion back in left UE following block. Voiding well. Positive flatus.     Objective: Vital signs in last 24 hours: Temp:  [97.6 F (36.4 C)-98.3 F (36.8 C)] 97.8 F (36.6 C) (09/02 0615) Pulse Rate:  [56-70] 63 (09/02 0615) Resp:  [14-28] 16 (09/02 0615) BP: (98-149)/(44-71) 107/49 (09/02 0615) SpO2:  [94 %-100 %] 95 % (09/02 0615)  Intake/Output from previous day: 09/01 0701 - 09/02 0700 In: 3229.1 [P.O.:1020; I.V.:2009.1; IV Piggyback:200] Out: [id number] [Urine:1600; Blood:50] Intake/Output this shift: No intake/output data recorded.  Recent Labs    06/10/21 0314  HGB 11.1*   Recent Labs    06/10/21 0314  WBC 9.0  RBC 3.52*  HCT 32.0*  PLT 163   Recent Labs    06/10/21 0314  NA 138  K 3.6  CL 108  CO2 26  BUN 17  CREATININE 0.70  GLUCOSE 90  CALCIUM 8.6*     Physical Exam: Alert and oriented, no acute distress Intact pulses distally Incision: dressing C/D/I No cellulitis present Some sensation to light touch left hand Can flex fingers on left hand  Assessment/Plan: 1 Day Post-Op Procedure(s) (LRB): REVERSE SHOULDER ARTHROPLASTY (Left)   Advance diet Up with therapy D/C IV fluids  Touch Down Weight Bearing (TDWB)L UE  VTE prophylaxis:  aspirin '81mg'$  daily  Plan for DC home today after OT. Follow up in office on 9/16. DC instructions given.    Talibah Colasurdo L. [place], PA-C 06/10/2021, 8:07 AM

## 2021-06-10 NOTE — Evaluation (Signed)
Occupational Therapy Evaluation Patient Details Name: Jennifer Hull MRN: [id number] DOB: 24-Jul-1941 Today's Date: 06/10/2021    History of Present Illness Patient is a 80 year old female s/p L reverse total shoudler arthroplasty. PMH includes  R shoulder surgery, PNA, RA   Clinical Impression   Patient is a 80 year old female s/p shoulder replacement without functional use of left non-dominant upper extremity secondary to effects of surgery and interscalene block and shoulder precautions. Therapist provided education and instruction to patient and spouse in regards to exercises, precautions, positioning, donning upper extremity clothing and bathing while maintaining shoulder precautions, ice and edema management and donning/doffing sling. Patient and spouse verbalized understanding and demonstrated as needed. Patient needed assistance to donn shirt and provided with instruction on compensatory strategies to perform ADLs. Patient to follow up with MD for further therapy needs.      Follow Up Recommendations  Follow surgeon's recommendation for DC plan and follow-up therapies    Equipment Recommendations  None recommended by OT       Precautions / Restrictions Precautions Precautions: Shoulder Type of Shoulder Precautions: AROM elbow, wrist, hand ok. no A/PROM shoulder Shoulder Interventions: Shoulder sling/immobilizer;Off for dressing/bathing/exercises Precaution Booklet Issued: Yes (comment) Required Braces or Orthoses: Sling Restrictions Weight Bearing Restrictions: Yes RUE Weight Bearing: Non weight bearing LUE Weight Bearing: Non weight bearing      Mobility Bed Mobility Overal bed mobility: Modified Independent                  Transfers Overall transfer level: Independent Equipment used: None                  Balance Overall balance assessment: No apparent balance deficits (not formally assessed)                                          ADL either performed or assessed with clinical judgement   ADL Overall ADL's : Needs assistance/impaired Eating/Feeding: Independent   Grooming: Independent   Upper Body Bathing: Minimal assistance   Lower Body Bathing: Independent   Upper Body Dressing : Minimal assistance;Standing Upper Body Dressing Details (indicate cue type and reason): to thread L UE into shirt sleeve Lower Body Dressing: Independent;Sit to/from stand   Toilet Transfer: Independent;Ambulation   Toileting- Clothing Manipulation and Hygiene: Independent       Functional mobility during ADLs: Independent General ADL Comments: patient and spouse educated in compensatory strategies for ADL tasks in order to maintain shoulder precautions.                         Pertinent Vitals/Pain Pain Assessment: Faces Faces Pain Scale: Hurts a little bit Pain Location: L UE Pain Descriptors / Indicators: Heaviness;Numbness Pain Intervention(s): Monitored during session     Hand Dominance Right   Extremity/Trunk Assessment Upper Extremity Assessment Upper Extremity Assessment: LUE deficits/detail LUE Deficits / Details: + nerve block, able to move fingers and make fist   Lower Extremity Assessment Lower Extremity Assessment: Overall WFL for tasks assessed   Cervical / Trunk Assessment Cervical / Trunk Assessment: Normal   Communication Communication Communication: No difficulties   Cognition Arousal/Alertness: Awake/alert Behavior During Therapy: WFL for tasks assessed/performed Overall Cognitive Status: Within Functional Limits for tasks assessed  Exercises Exercises: Shoulder   Shoulder Instructions Shoulder Instructions Donning/doffing shirt without moving shoulder: Minimal assistance;Caregiver independent with task;Patient able to independently direct caregiver Method for sponge bathing under operated UE: Minimal assistance;Caregiver  independent with task;Patient able to independently direct caregiver Donning/doffing sling/immobilizer: Moderate assistance;Caregiver independent with task;Patient able to independently direct caregiver Correct positioning of sling/immobilizer: Minimal assistance;Caregiver independent with task;Patient able to independently direct caregiver Pendulum exercises (written home exercise program):  (N/A) ROM for elbow, wrist and digits of operated UE: Patient able to independently direct caregiver;Caregiver independent with task Sling wearing schedule (on at all times/off for ADL's): Patient able to independently direct caregiver;Caregiver independent with task Proper positioning of operated UE when showering: Patient able to independently direct caregiver;Caregiver independent with task Dressing change:  (N/A) Positioning of UE while sleeping: Patient able to independently direct caregiver;Caregiver independent with task    Home Living Family/patient expects to be discharged to:: Private residence Living Arrangements: Spouse/significant other Available Help at Discharge: Family Type of Home: House Home Access: Level entry     Home Layout: Two level;Able to live on main level with bedroom/bathroom     Bathroom Shower/Tub: Occupational psychologist: Handicapped height     Home Equipment: Grab bars - tub/shower          Prior Functioning/Environment Level of Independence: Independent                 OT Problem List: Pain;Impaired UE functional use;Decreased knowledge of precautions         OT Goals(Current goals can be found in the care plan section) Acute Rehab OT Goals Patient Stated Goal: home soon OT Goal Formulation: All assessment and education complete, DC therapy   AM-PAC OT "6 Clicks" Daily Activity     Outcome Measure Help from another person eating meals?: None Help from another person taking care of personal grooming?: None Help from another person  toileting, which includes using toliet, bedpan, or urinal?: None Help from another person bathing (including washing, rinsing, drying)?: A Little Help from another person to put on and taking off regular upper body clothing?: A Little Help from another person to put on and taking off regular lower body clothing?: None 6 Click Score: 22   End of Session Equipment Utilized During Treatment: Other (comment) (sling) Nurse Communication: Other (comment) (OT complete)  Activity Tolerance: Patient tolerated treatment well Patient left: in chair;with call bell/phone within reach;with family/visitor present  OT Visit Diagnosis: Pain Pain - Right/Left: Left Pain - part of body: Shoulder                Time: 0929-1000 OT Time Calculation (min): 31 min Charges:  OT General Charges $OT Visit: 1 Visit OT Evaluation $OT Eval Low Complexity: 1 Low OT Treatments $Self Care/Home Management : 8-22 mins  Delbert Phenix OT OT pager: [place] 06/10/2021, 10:42 AM

## 2021-06-14 ENCOUNTER — Encounter (HOSPITAL_COMMUNITY): Payer: Self-pay | Admitting: Orthopedic Surgery

## 2021-06-16 DIAGNOSIS — K581 Irritable bowel syndrome with constipation: Secondary | ICD-10-CM

## 2021-06-16 DIAGNOSIS — K5900 Constipation, unspecified: Secondary | ICD-10-CM

## 2021-06-16 NOTE — Discharge Summary (Signed)
Patient ID: Jennifer Hull MRN: [id number] DOB/AGE: 03/11/1941 80 y.o.  Admit date: 06/09/2021 Discharge date: 06/16/2021  Admission Diagnoses:  Active Problems:   S/P reverse total shoulder arthroplasty, left   Discharge Diagnoses:  Same  Past Medical History:  Diagnosis Date   Anxiety    Arthritis    Chronic constipation    Depression    Hyperlipidemia    Hypothyroidism    Pneumonia    Rheumatoid arthritis ([place]) 2019   Temporal arteritis ([place])    Thyroid disease     Surgeries: Procedure(s): REVERSE SHOULDER ARTHROPLASTY on 06/09/2021   Consultants:   Discharged Condition: Improved  Hospital Course: ALYSIANA DEVILLIER is an 80 y.o. female who was admitted 06/09/2021 for operative treatment of end stage arthritis left shoulder. Patient has severe unremitting pain that affects sleep, daily activities, and work/hobbies. After pre-op clearance the patient was taken to the operating room on 06/09/2021 and underwent  Procedure(s): [place].    Patient was given perioperative antibiotics:  Anti-infectives (From admission, onward)    Start     Dose/Rate Route Frequency Ordered Stop   06/09/21 0600  ceFAZolin (ANCEF) IVPB 2g/100 mL premix        2 g 200 mL/hr over 30 Minutes Intravenous On call to O.R. 06/09/21 [id number] 06/09/21 [id number]        Patient was given sequential compression devices, early ambulation, and chemoprophylaxis to prevent DVT.  Patient benefited maximally from hospital stay and there were no complications.       Discharge Medications:   Allergies as of 06/10/2021       Reactions   Tdap [tetanus-diphth-acell Pertussis] Other (See Comments)   SEVERE ALLERGIC REACTIONS (NECK STIFFNESS/HIGH FEVER/ETC)   Tetanus Toxoid, Adsorbed    Neck stiffness, and high fever Other reaction(s): Other (See Comments) Neck stiffness, and high fever   Hydroxychloroquine Sulfate Rash   Covid-19 (mrna) Vaccine (pfizer) [covid-19 (mrna) Vaccine] Rash         Medication List     STOP taking these medications    meloxicam 7.5 MG tablet Commonly known as: MOBIC       TAKE these medications    acetaminophen 500 MG tablet Commonly known as: TYLENOL Take 1,000 mg by mouth every 8 (eight) hours as needed for moderate pain.   ACTEMRA IV Inject into the vein every 30 (thirty) days.   aspirin 81 MG chewable tablet Chew 1 tablet (81 mg total) by mouth daily.   atorvastatin 20 MG tablet Commonly known as: LIPITOR Take 1 tablet by mouth once daily What changed: when to take this   CALCIUM 600+D3 PO Take 1 tablet by mouth daily with lunch.   cholecalciferol 25 MCG (1000 UNIT) tablet Commonly known as: VITAMIN D3 Take 1,000 Units by mouth daily.   Euthyrox 112 MCG tablet Generic drug: levothyroxine TAKE 1 TABLET BY MOUTH ONCE DAILY BEFORE BREAKFAST What changed: how much to take   Fish Oil 1000 MG Caps Take 1,000 mg by mouth daily.   FLAXSEED OIL PO Take 3 g by mouth daily.   fluticasone 50 MCG/ACT nasal spray Commonly known as: FLONASE Place 2 sprays into both nostrils at bedtime.   folic acid [id number] MCG tablet Commonly known as: FOLVITE Take 800 mcg by mouth daily.   gabapentin 300 MG capsule Commonly known as: NEURONTIN Take 1 capsule (300 mg total) by mouth 3 (three) times daily. What changed: when to take this   HYDROcodone-acetaminophen 5-325 MG tablet Commonly known as: NORCO/VICODIN  Take 1 tablet by mouth every 6 (six) hours as needed for moderate pain (pain score 4-6).   hydrOXYzine 25 MG tablet Commonly known as: ATARAX/VISTARIL Take 1 tablet (25 mg total) by mouth daily as needed.   linaclotide 290 MCG Caps capsule Commonly known as: Linzess Take 1 capsule (290 mcg total) by mouth daily before breakfast.   loratadine 10 MG tablet Commonly known as: CLARITIN Take 1 tablet (10 mg total) by mouth daily.   magnesium oxide 400 MG tablet Commonly known as: MAG-OX Take 400 mg by mouth daily.   Melatonin  10 MG Caps Take 10 mg by mouth at bedtime.   methocarbamol 500 MG tablet Commonly known as: ROBAXIN Take 1 tablet (500 mg total) by mouth every 8 (eight) hours as needed for muscle spasms.   pantoprazole 40 MG tablet Commonly known as: PROTONIX Take 1 tablet (40 mg total) by mouth daily.   sertraline 100 MG tablet Commonly known as: ZOLOFT TAKE 1 & 1/2 (ONE & ONE-HALF) TABLETS BY MOUTH ONCE DAILY What changed: See the new instructions.   simethicone 125 MG chewable tablet Commonly known as: MYLICON Chew [id number] mg by mouth once.   temazepam 15 MG capsule Commonly known as: RESTORIL TAKE 1 CAPSULE BY MOUTH AT BEDTIME AS NEEDED FOR SLEEP What changed: See the new instructions.   TURMERIC CURCUMIN PO Take 3,000 mg by mouth daily.   zinc gluconate 50 MG tablet Take 50 mg by mouth daily.        Diagnostic Studies: DG Shoulder Left Port  Result Date: 06/09/2021 CLINICAL DATA:  Shoulder replacement surgery today EXAM: LEFT SHOULDER COMPARISON:  05/25/2021 FINDINGS: Left shoulder replacement with reverse arthroplasty. Prosthesis in good position without complication or fracture. IMPRESSION: Satisfactory left shoulder replacement. Electronically Signed   By: Franchot Gallo M.D.   On: 06/09/2021 10:00    Disposition: Discharge disposition: 01-Home or Self Care       Discharge Instructions     Call MD / Call 911   Complete by: As directed    If you experience chest pain or shortness of breath, CALL 911 and be transported to the hospital emergency room.  If you develope a fever above 101 F, pus (white drainage) or increased drainage or redness at the wound, or calf pain, call your surgeon's office.   Constipation Prevention   Complete by: As directed    Drink plenty of fluids.  Prune juice may be helpful.  You may use a stool softener, such as Colace (over the counter) 100 mg twice a day.  Use MiraLax (over the counter) for constipation as needed.   Diet - low sodium heart  healthy   Complete by: As directed    Discharge instructions   Complete by: As directed    Discharge Instructions after Reverse Total Shoulder Arthroplasty    A sling has been provided for you. You are to wear this at all times (except for bathing and dressing), until your first post operative visit with Dr. Tamera Punt. Please also wear while sleeping at night. While you bath and dress, let the arm/elbow extend straight down to stretch your elbow. Wiggle your fingers and pump your first while your in the sling to prevent hand swelling.  Use ice on the shoulder intermittently over the first 48 hours after surgery. Continue to use ice or and ice machine as needed after 48 hours for pain control/swelling.   Pain medicine has been prescribed for you.   Use your medicine liberally  over the first 48 hours, and then you can begin to taper your use. You may take Extra Strength Tylenol or Tylenol only in place of the pain pills. DO NOT take ANY nonsteroidal anti-inflammatory pain medications: Advil, Motrin, Ibuprofen, Aleve, Naproxen or Naprosyn.   Take one aspirin a day for 2 weeks after surgery, unless you have an aspirin sensitivity/allergy or asthma.   Leave your dressing on until your first follow up visit.  You may shower with the dressing.  Hold your arm as if you still have your sling on while you shower.  Simply allow the water to wash over the site and then pat dry. Make sure your axilla (armpit) is completely dry after showering.    Please call [phone] during normal business hours or [phone] after hours for any problems. Including the following:  - excessive redness of the incisions - drainage for more than 4 days - fever of more than 101.5 F  *Please note that pain medications will not be refilled after hours or on weekends.   Post-operative opioid taper instructions:   Complete by: As directed    POST-OPERATIVE OPIOID TAPER INSTRUCTIONS: It is important to wean off of  your opioid medication as soon as possible. If you do not need pain medication after your surgery it is ok to stop day one. Opioids include: Codeine, Hydrocodone(Norco, Vicodin), Oxycodone(Percocet, oxycontin) and hydromorphone amongst others.  Long term and even short term use of opiods can cause: Increased pain response Dependence Constipation Depression Respiratory depression And more.  Withdrawal symptoms can include Flu like symptoms Nausea, vomiting And more Techniques to manage these symptoms Hydrate well Eat regular healthy meals Stay active Use relaxation techniques(deep breathing, meditating, yoga) Do Not substitute Alcohol to help with tapering If you have been on opioids for less than two weeks and do not have pain than it is ok to stop all together.  Plan to wean off of opioids This plan should start within one week post op of your joint replacement. Maintain the same interval or time between taking each dose and first decrease the dose.  Cut the total daily intake of opioids by one tablet each day Next start to increase the time between doses. The last dose that should be eliminated is the evening dose.           Follow-up Information     Tania Ade, MD. Schedule an appointment as soon as possible for a visit in 2 week(s).   Specialty: Orthopedic Surgery Contact information: [place] [address] [phone]                  Signed: Luetta Nutting L. [place], PA-C 06/16/2021, 9:23 AM

## 2021-06-17 ENCOUNTER — Ambulatory Visit (INDEPENDENT_AMBULATORY_CARE_PROVIDER_SITE_OTHER)
Admission: RE | Admit: 2021-06-17 | Discharge: 2021-06-17 | Disposition: A | Discharge status: Home / Self Care | Payer: Medicare Other | Source: Ambulatory Visit | Attending: Gastroenterology | Admitting: Gastroenterology

## 2021-06-17 ENCOUNTER — Other Ambulatory Visit: Payer: Self-pay

## 2021-06-17 DIAGNOSIS — K5900 Constipation, unspecified: Secondary | ICD-10-CM | POA: Diagnosis not present

## 2021-06-17 DIAGNOSIS — Z8719 Personal history of other diseases of the digestive system: Secondary | ICD-10-CM | POA: Diagnosis not present

## 2021-06-17 DIAGNOSIS — K581 Irritable bowel syndrome with constipation: Secondary | ICD-10-CM | POA: Diagnosis not present

## 2021-06-17 MED ORDER — TRULANCE 3 MG PO TABS: 1.0000 | ORAL_TABLET | Freq: Every day | ORAL | 0 refills | Status: DC

## 2021-06-17 NOTE — Telephone Encounter (Signed)
Dr. Rush Landmark, please see note below from patient and advise. Anderson Malta is out of the office. Thank you

## 2021-06-17 NOTE — Telephone Encounter (Signed)
Some thoughts and answers for her questions: 1) Glycerin suppositories are safe and if necessary can be used 1-2 times daily 2) Dulcolax (Bisacodyl) can be used as well if necessary and can be safe 3) Some regimens to consider ---A) Linzess 290 + Miralax twice daily + Dulcolax once daily + Glycerin suppository once-twice daily as needed -------Once bowel movement is had continue this regimen, until she is having a bowel movement every other day ---B) Trial a new laxative (If we have samples of Trulance or Motegrity) she can switch this out for the Linzess with hope that it will work better than Linzess -------Trulance daily or Motegrity daily + Miralax twice daily + Dulcolax once daily + Glycerin suppository once-twice daily as needed 4) If she comes in for any samples, then have her get a KUB 2-view to evaluate stool burden 5) If she does not come in for any samples, have her come in next week for a KUB to evaluate the stool burden 6) Let her know to update JLL and myself into next week to see how things are  [facility] that helps.  GM

## 2021-06-18 ENCOUNTER — Other Ambulatory Visit: Payer: Self-pay | Admitting: Family Medicine

## 2021-06-18 DIAGNOSIS — F331 Major depressive disorder, recurrent, moderate: Secondary | ICD-10-CM

## 2021-06-18 DIAGNOSIS — F411 Generalized anxiety disorder: Secondary | ICD-10-CM

## 2021-06-24 DIAGNOSIS — Z96612 Presence of left artificial shoulder joint: Secondary | ICD-10-CM | POA: Diagnosis not present

## 2021-06-24 DIAGNOSIS — Z471 Aftercare following joint replacement surgery: Secondary | ICD-10-CM | POA: Diagnosis not present

## 2021-06-27 ENCOUNTER — Ambulatory Visit: Payer: Medicare Other | Admitting: Family Medicine

## 2021-06-27 DIAGNOSIS — Z79899 Other long term (current) drug therapy: Secondary | ICD-10-CM | POA: Diagnosis not present

## 2021-06-27 DIAGNOSIS — M0579 Rheumatoid arthritis with rheumatoid factor of multiple sites without organ or systems involvement: Secondary | ICD-10-CM | POA: Diagnosis not present

## 2021-06-29 ENCOUNTER — Other Ambulatory Visit: Payer: Self-pay | Admitting: Family Medicine

## 2021-06-29 DIAGNOSIS — G4700 Insomnia, unspecified: Secondary | ICD-10-CM

## 2021-07-01 NOTE — Telephone Encounter (Signed)
Last filled 05/04/21, next ov is 07/04/21.

## 2021-07-04 ENCOUNTER — Telehealth: Payer: Medicare Other | Admitting: Family Medicine

## 2021-07-05 ENCOUNTER — Ambulatory Visit: Payer: Medicare Other | Admitting: Family Medicine

## 2021-07-08 ENCOUNTER — Ambulatory Visit: Payer: Medicare Other

## 2021-07-19 ENCOUNTER — Other Ambulatory Visit: Payer: Self-pay

## 2021-07-19 ENCOUNTER — Ambulatory Visit (INDEPENDENT_AMBULATORY_CARE_PROVIDER_SITE_OTHER): Payer: Medicare Other | Admitting: Family Medicine

## 2021-07-19 ENCOUNTER — Encounter: Payer: Self-pay | Admitting: Family Medicine

## 2021-07-19 VITALS — BP 110/70 | HR 82 | Resp 16 | Ht 64.0 in | Wt 134.1 lb

## 2021-07-19 DIAGNOSIS — F411 Generalized anxiety disorder: Secondary | ICD-10-CM

## 2021-07-19 DIAGNOSIS — G4700 Insomnia, unspecified: Secondary | ICD-10-CM

## 2021-07-19 DIAGNOSIS — E785 Hyperlipidemia, unspecified: Secondary | ICD-10-CM

## 2021-07-19 DIAGNOSIS — K5900 Constipation, unspecified: Secondary | ICD-10-CM

## 2021-07-19 DIAGNOSIS — G629 Polyneuropathy, unspecified: Secondary | ICD-10-CM | POA: Diagnosis not present

## 2021-07-19 DIAGNOSIS — F331 Major depressive disorder, recurrent, moderate: Secondary | ICD-10-CM | POA: Diagnosis not present

## 2021-07-19 NOTE — Assessment & Plan Note (Signed)
Problem is stable. Continue sertraline 100 mg 1-1/2 tablet daily and temazepam 15 mg daily at bedtime as needed.

## 2021-07-19 NOTE — Assessment & Plan Note (Signed)
Problem is stable. Continue sertraline 150 mg daily.

## 2021-07-19 NOTE — Assessment & Plan Note (Signed)
Problem has improved. Continue atorvastatin 20 mg daily and low-fat diet.

## 2021-07-19 NOTE — Assessment & Plan Note (Signed)
Problem is not well controlled. She cannot afford Trulance same. Continue Linzess 290 mcg daily and Dulcolax 5 mg at night prn.She could try MiraLAX also at bedtime daily as needed. Prune juice 2- 4 ounces daily may also help. Continue adequate fiber and fluid intake. Following with GI.

## 2021-07-19 NOTE — Assessment & Plan Note (Addendum)
Problem is well controlled. Continue Temazepam 15 mg at bedtime prn. Continue good sleep hygiene.

## 2021-07-19 NOTE — Patient Instructions (Addendum)
A few things to remember from today's visit:   Generalized anxiety disorder  Insomnia, unspecified type  Major depressive disorder, recurrent episode, moderate ([place])  If you need refills please call your pharmacy. Do not use My Chart to request refills or for acute issues that need immediate attention.   Try Miralax at night with prune juice para la constipation. No cambios en el resto de medicamentos.  Please be sure medication list is accurate. If a new problem present, please set up appointment sooner than planned today.

## 2021-07-19 NOTE — Assessment & Plan Note (Signed)
Stable. Continue gabapentin 300 mg 2-3 times per day. Fall precautions.

## 2021-07-19 NOTE — Progress Notes (Signed)
HPI: [name] is a 80 y.o. female with hx of hypothyroidism,RA,OA,GERD,anxiety,and depression here today for 6 months follow up.   She was last seen on 06/08/2021 for preop evaluation. She underwent left shoulder surgery, reverse shoulder arthroplasty on 06/09/21. Shoulder pain has improved, still some limitation of ROM, she is doing ROM exercises.  Anxiety, insomnia,and depression:  When she takes Temazepam she sleeps through the night otherwise she wakes up 1-3 am and not able to fall sleep. Currently she is on sertraline 100 mg tablet 1-1/2 tablet daily. Symptoms are stable. Tolerating medication well, no side effects reported.  Depression screen [facility] 2/9 07/19/2021 03/31/2021 01/24/2021 01/30/2020 01/01/2019  Decreased Interest 0 0 0 1 0  Down, Depressed, Hopeless 0 0 0 1 0  PHQ - 2 Score 0 0 0 2 0  Altered sleeping 2 - 3 2 -  Tired, decreased energy 1 - 2 1 -  Change in appetite 0 - 0 0 -  Feeling bad or failure about yourself  0 - 0 0 -  Trouble concentrating 0 - 2 2 -  Moving slowly or fidgety/restless 0 - 0 0 -  Suicidal thoughts 0 - 0 0 -  PHQ-9 Score 3 - 7 7 -  Difficult doing work/chores - - Somewhat difficult Somewhat difficult -   Peripheral neuropathy: Currently she is on gabapentin 300 mg, she takes medication most of the time twice daily. Lower back pain radiates to right LE, which seems to be worse at night.  Hyperlipidemia: Currently on Atorvastatin 20 mg daily. Following a low fat diet: Yes. Side effects from medication:None.  She has blood work on 06/27/21. TC 152, TG 129,HDL 57,and LDL 72.  Constipation : Linzess 290 mcg is not longer helping. She has a bowel movement q 3-4 days  She cannot afford new medication, Trulance 3 mg, she tried samples and it did help. She has to take Dulcolax 1-2 at bedtime daily prn. Negative for abdominal pain,nausea,vomiting, melena,or blood in stool.  Lab Results  Component Value Date   TSH 1.01 01/24/2021    Following with GI.  Review of Systems  Constitutional:  Positive for fatigue. Negative for appetite change and chills.  Respiratory:  Negative for cough, shortness of breath and wheezing.   Cardiovascular:  Negative for chest pain, palpitations and leg swelling.  Endocrine: Negative for cold intolerance and heat intolerance.  Genitourinary:  Negative for decreased urine volume, dysuria and hematuria.  Musculoskeletal:  Positive for arthralgias and gait problem.  Neurological:  Negative for syncope, weakness and headaches.  Psychiatric/Behavioral:  Positive for sleep disturbance. Negative for confusion. The patient is nervous/anxious.   Rest of ROS, see pertinent positives sand negatives in HPI  Current Outpatient Medications on File Prior to Visit  Medication Sig Dispense Refill   acetaminophen (TYLENOL) 500 MG tablet Take 1,000 mg by mouth every 8 (eight) hours as needed for moderate pain.     aspirin 81 MG chewable tablet Chew 1 tablet (81 mg total) by mouth daily. 20 tablet 0   atorvastatin (LIPITOR) 20 MG tablet Take 1 tablet by mouth once daily (Patient taking differently: Take 20 mg by mouth at bedtime.) 90 tablet 3   Calcium Carb-Cholecalciferol (CALCIUM 600+D3 PO) Take 1 tablet by mouth daily with lunch.     cholecalciferol (VITAMIN D3) 25 MCG (1000 UNIT) tablet Take 1,000 Units by mouth daily.     EUTHYROX 112 MCG tablet TAKE 1 TABLET BY MOUTH ONCE DAILY BEFORE BREAKFAST (Patient taking differently:  Take 112 mcg by mouth daily before breakfast.) 90 tablet 2   Flaxseed, Linseed, (FLAXSEED OIL PO) Take 3 g by mouth daily.     fluticasone (FLONASE) 50 MCG/ACT nasal spray Place 2 sprays into both nostrils at bedtime.     folic acid (FOLVITE) 287 MCG tablet Take 800 mcg by mouth daily.     gabapentin (NEURONTIN) 300 MG capsule Take 1 capsule (300 mg total) by mouth 3 (three) times daily. (Patient taking differently: Take 1 capsule by mouth 2 (two) times daily.) 90 capsule 3    linaclotide (LINZESS) 290 MCG CAPS capsule Take 1 capsule (290 mcg total) by mouth daily before breakfast. 30 capsule 3   loratadine (CLARITIN) 10 MG tablet Take 1 tablet (10 mg total) by mouth daily. 30 tablet 11   magnesium oxide (MAG-OX) 400 MG tablet Take 400 mg by mouth daily.     Melatonin 10 MG CAPS Take 10 mg by mouth at bedtime.     Omega-3 Fatty Acids (FISH OIL) 1000 MG CAPS Take 1,000 mg by mouth daily.     pantoprazole (PROTONIX) 40 MG tablet Take 1 tablet (40 mg total) by mouth daily. 90 tablet 3   Plecanatide (TRULANCE) 3 MG TABS Take 1 tablet by mouth daily before breakfast. LOT# [id number], EXP: 01/2023 12 tablet 0   sertraline (ZOLOFT) 100 MG tablet TAKE 1 & 1/2 (ONE & ONE-HALF) TABLETS BY MOUTH ONCE DAILY 135 tablet 2   simethicone (MYLICON) 209 MG chewable tablet Chew 250 mg by mouth once.     temazepam (RESTORIL) 15 MG capsule TAKE 1 CAPSULE BY MOUTH AT BEDTIME AS NEEDED FOR SLEEP 30 capsule 3   Tocilizumab (ACTEMRA IV) Inject into the vein every 30 (thirty) days.     TURMERIC CURCUMIN PO Take 3,000 mg by mouth daily.     zinc gluconate 50 MG tablet Take 50 mg by mouth daily.     No current facility-administered medications on file prior to visit.   Past Medical History:  Diagnosis Date   Anxiety    Arthritis    Chronic constipation    Depression    Hyperlipidemia    Hypothyroidism    Pneumonia    Rheumatoid arthritis ([place]) 2019   Temporal arteritis (HCC)    Thyroid disease    Allergies  Allergen Reactions   Tdap [Tetanus-Diphth-Acell Pertussis] Other (See Comments)    SEVERE ALLERGIC REACTIONS (NECK STIFFNESS/HIGH FEVER/ETC)   Tetanus Toxoid, Adsorbed     Neck stiffness, and high fever Other reaction(s): Other (See Comments) Neck stiffness, and high fever   Hydroxychloroquine Sulfate Rash   Covid-19 (Mrna) Vaccine Therapist, music) [Covid-19 (Mrna) Vaccine] Rash   Social History   Socioeconomic History   Marital status: Married    Spouse name: Not on file   Number  of children: Not on file   Years of education: Not on file   Highest education level: Not on file  Occupational History   Not on file  Tobacco Use   Smoking status: Former    Packs/day: 1.50    Years: 8.00    Pack years: 12.00    Types: Cigarettes    Quit date: 66    Years since quitting: 44.8   Smokeless tobacco: Never   Tobacco comments:    smoked infrequently when she was young; quit 80 yo   Vaping Use   Vaping Use: Never used  Substance and Sexual Activity   Alcohol use: No   Drug use: No   Sexual  activity: Yes    Partners: Male    Birth control/protection: Surgical  Other Topics Concern   Not on file  Social History Narrative   Not on file   Social Determinants of Health   Financial Resource Strain: Not on file  Food Insecurity: Not on file  Transportation Needs: Not on file  Physical Activity: Not on file  Stress: Not on file  Social Connections: Not on file    Vitals:   07/19/21 1410  BP: 110/70  Pulse: 82  Resp: 16  SpO2: 98%   Body mass index is 23.02 kg/m.  Physical Exam Vitals and nursing note reviewed.  Constitutional:      General: She is not in acute distress.    Appearance: She is well-developed and normal weight.  HENT:     Head: Normocephalic and atraumatic.     Mouth/Throat:     Mouth: Mucous membranes are moist.     Pharynx: Oropharynx is clear.  Eyes:     Conjunctiva/sclera: Conjunctivae normal.  Cardiovascular:     Rate and Rhythm: Normal rate and regular rhythm.     Pulses:          Dorsalis pedis pulses are 2+ on the right side and 2+ on the left side.     Heart sounds: No murmur heard. Pulmonary:     Effort: Pulmonary effort is normal. No respiratory distress.     Breath sounds: Normal breath sounds.  Abdominal:     Palpations: Abdomen is soft. There is no hepatomegaly or mass.     Tenderness: There is no abdominal tenderness.  Musculoskeletal:     Comments: Antalgic gait, not assisted.  Lymphadenopathy:      Cervical: No cervical adenopathy.  Skin:    General: Skin is warm.     Findings: No erythema or rash.  Neurological:     General: No focal deficit present.     Mental Status: She is alert and oriented to person, place, and time.     Cranial Nerves: No cranial nerve deficit.  Psychiatric:     Comments: Well groomed, good eye contact.   ASSESSMENT AND PLAN:  Ms. Hae Ahlers Biggers was seen today for 6 months follow-up.  Diagnoses and all orders for this visit:  Insomnia Problem is well controlled. Continue Temazepam 15 mg at bedtime prn. Continue good sleep hygiene.  Hyperlipidemia Problem has improved. Continue atorvastatin 20 mg daily and low-fat diet.  Major depressive disorder, recurrent episode, moderate (HCC) Problem is stable. Continue sertraline 150 mg daily.  Peripheral neuropathy Stable. Continue gabapentin 300 mg 2-3 times per day. Fall precautions.  Constipation Problem is not well controlled. She cannot afford Trulance same. Continue Linzess 290 mcg daily and Dulcolax 5 mg at night prn.She could try MiraLAX also at bedtime daily as needed. Prune juice 2- 4 ounces daily may also help. Continue adequate fiber and fluid intake. Following with GI.  Generalized anxiety disorder Problem is stable. Continue sertraline 100 mg 1-1/2 tablet daily and temazepam 15 mg daily at bedtime as needed.  Return in about 6 months (around 01/17/2022).  Jennifer Labrada G. Martinique, MD  [facility]. [place] office.

## 2021-07-20 ENCOUNTER — Other Ambulatory Visit: Payer: Self-pay | Admitting: Family Medicine

## 2021-07-20 DIAGNOSIS — G629 Polyneuropathy, unspecified: Secondary | ICD-10-CM

## 2021-07-22 DIAGNOSIS — Z96612 Presence of left artificial shoulder joint: Secondary | ICD-10-CM | POA: Diagnosis not present

## 2021-07-22 DIAGNOSIS — Z471 Aftercare following joint replacement surgery: Secondary | ICD-10-CM | POA: Diagnosis not present

## 2021-08-01 DIAGNOSIS — M0579 Rheumatoid arthritis with rheumatoid factor of multiple sites without organ or systems involvement: Secondary | ICD-10-CM | POA: Diagnosis not present

## 2021-08-09 ENCOUNTER — Other Ambulatory Visit: Payer: Self-pay | Admitting: Physician Assistant

## 2021-08-09 ENCOUNTER — Ambulatory Visit (INDEPENDENT_AMBULATORY_CARE_PROVIDER_SITE_OTHER): Payer: Medicare Other | Admitting: Physician Assistant

## 2021-08-09 ENCOUNTER — Encounter: Payer: Self-pay | Admitting: Physician Assistant

## 2021-08-09 VITALS — BP 120/70 | HR 64 | Ht 64.0 in | Wt 133.0 lb

## 2021-08-09 DIAGNOSIS — K5909 Other constipation: Secondary | ICD-10-CM | POA: Diagnosis not present

## 2021-08-09 MED ORDER — TRULANCE 3 MG PO TABS: 1.0000 | ORAL_TABLET | Freq: Every day | ORAL | 5 refills | Status: DC

## 2021-08-09 NOTE — Patient Instructions (Signed)
Continue Trulance 3 mg daily.   If you are age 79 or older, your body mass index should be between 23-30. Your Body mass index is 22.83 kg/m. If this is out of the aforementioned range listed, please consider follow up with your Primary Care Provider.  If you are age 94 or younger, your body mass index should be between 19-25. Your Body mass index is 22.83 kg/m. If this is out of the aformentioned range listed, please consider follow up with your Primary Care Provider.   ________________________________________________________  The [facility] providers would like to encourage you to use [facility] to communicate with providers for non-urgent requests or questions.  Due to long hold times on the telephone, sending your provider a message by [facility] may be a faster and more efficient way to get a response.  Please allow 48 business hours for a response.  Please remember that this is for non-urgent requests.  _______________________________________________________

## 2021-08-09 NOTE — Progress Notes (Signed)
Chief Complaint: Follow-up constipation  HPI:    Jennifer Hull is a 80 year old Caucasian female with a past medical history of chronic constipation, rheumatoid arthritis and others listed below, known to Dr. Rush Landmark, who returns to clinic today for follow-up of her constipation.      09/02/2019 EGD and colonoscopy. Colonoscopy with 8 polyps in the rectum, erythematous mucosa in the mid rectum and distal rectum and nonbleeding nonthrombosed boast external and internal hemorrhoids. EGD with small hiatal hernia, gastritis and no gross lesions in the duodenum. Pathology was significant for gastritis and a mixture of tubular adenomas and sessile serrated polyp.     06/07/2021 patient seen in clinic for constipation/diarrhea.  At that time discussed trouble with constipation regardless of her Linzess 145 mcg daily which is started not to work.  At that time recommend she increase her Linzess to 290 mcg daily.    06/16/2021 patient contacted our clinic and described continued constipation regardless of Linzess 290 mcg and MiraLAX 4 cups daily and 2 stool softeners.  She had used the sooner suppositories glycerin suppositories which eventually worked after her shoulder surgery.  At that time Dr. Rush Landmark described that she can use glycerin suppositories 1-2 times a day and add a stool softener if needed.  That time also discussed changing to Trulance once daily to see if this helps.  Recommended a KUB to evaluate stool burden if she came in for samples.    06/17/21 x-ray was read and showed no perforation just a colon full of stool.      07/26/2021 she had started Trulance samples and these were working well.    Today, the patient tells me that the Trulance works very well for her but this was going to cost her $700 to continue.  Explained that if she takes the Trulance then she only needs an occasional glycerin suppository.  When she does not have the Trulance she is having to take her Linzess 290 mcg as well  as MiraLAX 4 cups daily and two stool softeners and sometimes a glycerin suppository.  Tells me that it really just does not work for her.  Because occasionally then she will also have diarrhea.  Reminds me she has tried Amitiza in the past and this does not work either.  Has tried all the over-the-counter products.  She would really like to stay on Trulance if we can get her lower cost for her.    Denies fever, chills, weight loss or blood in her stool  Past Medical History:  Diagnosis Date   Anxiety    Arthritis    Chronic constipation    Depression    Hyperlipidemia    Hypothyroidism    Pneumonia    Rheumatoid arthritis ([place]) 2019   Temporal arteritis ([place])    Thyroid disease     Past Surgical History:  Procedure Laterality Date   ABDOMINAL HYSTERECTOMY     BREAST EXCISIONAL BIOPSY Left    BREAST SURGERY     biopsy   COLONOSCOPY     REVERSE SHOULDER ARTHROPLASTY Left 06/09/2021   Procedure: REVERSE SHOULDER ARTHROPLASTY;  Surgeon: Tania Ade, MD;  Location: [facility];  Service: Orthopedics;  Laterality: Left;   TEMPORAL ARTERY BIOPSY / LIGATION Bilateral 2014   TONSILLECTOMY     TOTAL SHOULDER ARTHROPLASTY Right 11/22/2017   Procedure: RIGHT REVERSE TOTAL SHOULDER ARTHROPLASTY;  Surgeon: Tania Ade, MD;  Location: [place];  Service: Orthopedics;  Laterality: Right;    Current Outpatient Medications  Medication  Sig Dispense Refill   acetaminophen (TYLENOL) 500 MG tablet Take 1,000 mg by mouth every 8 (eight) hours as needed for moderate pain.     aspirin 81 MG chewable tablet Chew 1 tablet (81 mg total) by mouth daily. 20 tablet 0   atorvastatin (LIPITOR) 20 MG tablet Take 1 tablet by mouth once daily (Patient taking differently: Take 20 mg by mouth at bedtime.) 90 tablet 3   Calcium Carb-Cholecalciferol (CALCIUM 600+D3 PO) Take 1 tablet by mouth daily with lunch.     cholecalciferol (VITAMIN D3) 25 MCG (1000 UNIT) tablet Take 1,000 Units by mouth daily.     EUTHYROX 112  MCG tablet TAKE 1 TABLET BY MOUTH ONCE DAILY BEFORE BREAKFAST (Patient taking differently: Take 112 mcg by mouth daily before breakfast.) 90 tablet 2   Flaxseed, Linseed, (FLAXSEED OIL PO) Take 3 g by mouth daily.     fluticasone (FLONASE) 50 MCG/ACT nasal spray Place 2 sprays into both nostrils at bedtime.     folic acid (FOLVITE) 258 MCG tablet Take 800 mcg by mouth daily.     gabapentin (NEURONTIN) 300 MG capsule TAKE 1 CAPSULE BY MOUTH THREE TIMES DAILY 90 capsule 3   linaclotide (LINZESS) 290 MCG CAPS capsule Take 1 capsule (290 mcg total) by mouth daily before breakfast. 30 capsule 3   loratadine (CLARITIN) 10 MG tablet Take 1 tablet (10 mg total) by mouth daily. 30 tablet 11   magnesium oxide (MAG-OX) 400 MG tablet Take 400 mg by mouth daily.     Melatonin 10 MG CAPS Take 10 mg by mouth at bedtime.     Omega-3 Fatty Acids (FISH OIL) 1000 MG CAPS Take 1,000 mg by mouth daily.     pantoprazole (PROTONIX) 40 MG tablet Take 1 tablet (40 mg total) by mouth daily. 90 tablet 3   Plecanatide (TRULANCE) 3 MG TABS Take 1 tablet by mouth daily before breakfast. LOT# [id number], EXP: 01/2023 12 tablet 0   sertraline (ZOLOFT) 100 MG tablet TAKE 1 & 1/2 (ONE & ONE-HALF) TABLETS BY MOUTH ONCE DAILY 135 tablet 2   simethicone (MYLICON) 242 MG chewable tablet Chew 250 mg by mouth once.     temazepam (RESTORIL) 15 MG capsule TAKE 1 CAPSULE BY MOUTH AT BEDTIME AS NEEDED FOR SLEEP 30 capsule 3   Tocilizumab (ACTEMRA IV) Inject into the vein every 30 (thirty) days.     TURMERIC CURCUMIN PO Take 3,000 mg by mouth daily.     zinc gluconate 50 MG tablet Take 50 mg by mouth daily.     No current facility-administered medications for this visit.    Allergies as of 08/09/2021 - Review Complete 07/19/2021  Allergen Reaction Noted   Tdap [tetanus-diphth-acell pertussis] Other (See Comments) 07/27/2016   Tetanus toxoid, adsorbed  12/07/2017   Hydroxychloroquine sulfate Rash 06/02/2021   Covid-19 (mrna) vaccine  (pfizer) [covid-19 (mrna) vaccine] Rash 05/13/2021    Family History  Problem Relation Age of Onset   Kidney cancer Mother    Heart disease Father    Breast cancer Maternal Aunt    Colon cancer Neg Hx    Esophageal cancer Neg Hx    Rectal cancer Neg Hx    Stomach cancer Neg Hx     Social History   Socioeconomic History   Marital status: Married    Spouse name: Not on file   Number of children: Not on file   Years of education: Not on file   Highest education level: Not on file  Occupational History   Not on file  Tobacco Use   Smoking status: Former    Packs/day: 1.50    Years: 8.00    Pack years: 12.00    Types: Cigarettes    Quit date: 2    Years since quitting: 44.8   Smokeless tobacco: Never   Tobacco comments:    smoked infrequently when she was young; quit 80 yo   Vaping Use   Vaping Use: Never used  Substance and Sexual Activity   Alcohol use: No   Drug use: No   Sexual activity: Yes    Partners: Male    Birth control/protection: Surgical  Other Topics Concern   Not on file  Social History Narrative   Not on file   Social Determinants of Health   Financial Resource Strain: Not on file  Food Insecurity: Not on file  Transportation Needs: Not on file  Physical Activity: Not on file  Stress: Not on file  Social Connections: Not on file  Intimate Partner Violence: Not on file    Review of Systems:    Constitutional: No weight loss, fever or chills Cardiovascular: No chest pain Respiratory: No SOB Gastrointestinal: See HPI and otherwise negative   Physical Exam:  Vital signs: BP 120/70   Pulse 64   Ht 5\' 4"  (1.626 m)   Wt 133 lb (60.3 kg)   BMI 22.83 kg/m    Constitutional:   Pleasant Elderly Caucasian female appears to be in NAD, Well developed, Well nourished, alert and cooperative Respiratory: Respirations even and unlabored. Lungs clear to auscultation bilaterally.   No wheezes, crackles, or rhonchi.  Cardiovascular: Normal S1, S2.  No MRG. Regular rate and rhythm. No peripheral edema, cyanosis or pallor.  Gastrointestinal:  Soft, nondistended, nontender. No rebound or guarding. Normal bowel sounds. No appreciable masses or hepatomegaly. Rectal:  Not performed.  Psychiatric: Demonstrates good judgement and reason without abnormal affect or behaviors.  RELEVANT LABS AND IMAGING: CBC    Component Value Date/Time   WBC 9.0 06/10/2021 0314   RBC 3.52 (L) 06/10/2021 0314   HGB 11.1 (L) 06/10/2021 0314   HCT 32.0 (L) 06/10/2021 0314   PLT 163 06/10/2021 0314   MCV 90.9 06/10/2021 0314   MCH 31.5 06/10/2021 0314   MCHC 34.7 06/10/2021 0314   RDW 12.9 06/10/2021 0314   LYMPHSABS 1.7 06/02/2021 0947   MONOABS 0.6 06/02/2021 0947   EOSABS 0.1 06/02/2021 0947   BASOSABS 0.1 06/02/2021 0947    CMP     Component Value Date/Time   NA 138 06/10/2021 0314   NA 144 03/23/2021 0000   K 3.6 06/10/2021 0314   CL 108 06/10/2021 0314   CO2 26 06/10/2021 0314   GLUCOSE 90 06/10/2021 0314   BUN 17 06/10/2021 0314   BUN 16 03/23/2021 0000   CREATININE 0.70 06/10/2021 0314   CALCIUM 8.6 (L) 06/10/2021 0314   PROT 6.8 06/02/2021 0947   ALBUMIN 4.7 06/02/2021 0947   AST 19 06/02/2021 0947   ALT 17 06/02/2021 0947   ALKPHOS 55 06/02/2021 0947   BILITOT 0.8 06/02/2021 0947   GFRNONAA >60 06/10/2021 0314   GFRAA >60 11/23/2017 0710    Assessment: 1.  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  Provided the patient with 9 days of samples  of Trulance.  Told her that we will try to get this approved through her insurance especially since she has tried all over-the-counter products and multiple prescription meds and this is now working well for her. 2.  If we cannot get the above approved then would recommend she continue on her Linzess 290 mcg  as well as for cups of MiraLAX, 2 stool softeners and occasional Glycerin suppositories.  We will see what else her insurance wants her to try if they deny her request. 3.  Patient to follow in clinic as needed.  Ellouise Newer, PA-C [facility] 08/09/2021, 2:16 PM  Cc: Martinique, Betty G, MD

## 2021-08-10 NOTE — Progress Notes (Signed)
Attending Physician's Attestation   I have reviewed the chart.   I agree with the Advanced Practitioner's note, impression, and recommendations with any updates as below. Hopefully patient will be able to get Trulance continued.  If not agree with plan of action with Linzess/MiraLAX/glycerin suppositories.  If she has not been on Amitiza that may be also considered as well.   Justice Britain, MD [facility] Office # [phone]

## 2021-08-11 DIAGNOSIS — D492 Neoplasm of unspecified behavior of bone, soft tissue, and skin: Secondary | ICD-10-CM | POA: Diagnosis not present

## 2021-08-12 ENCOUNTER — Telehealth: Payer: Self-pay | Admitting: *Deleted

## 2021-08-12 NOTE — Telephone Encounter (Signed)
PA submitted and approved via Covermymeds for Trulance. Approval faxed to pharmacy.

## 2021-08-31 DIAGNOSIS — M79672 Pain in left foot: Secondary | ICD-10-CM | POA: Diagnosis not present

## 2021-09-06 ENCOUNTER — Ambulatory Visit
Admission: RE | Admit: 2021-09-06 | Discharge: 2021-09-06 | Disposition: A | Discharge status: Home / Self Care | Payer: Medicare Other | Source: Ambulatory Visit | Attending: Family Medicine | Admitting: Family Medicine

## 2021-09-06 DIAGNOSIS — Z1231 Encounter for screening mammogram for malignant neoplasm of breast: Secondary | ICD-10-CM | POA: Diagnosis not present

## 2021-09-06 DIAGNOSIS — M0579 Rheumatoid arthritis with rheumatoid factor of multiple sites without organ or systems involvement: Secondary | ICD-10-CM | POA: Diagnosis not present

## 2021-09-26 DIAGNOSIS — M19072 Primary osteoarthritis, left ankle and foot: Secondary | ICD-10-CM | POA: Diagnosis not present

## 2021-09-27 DIAGNOSIS — H00022 Hordeolum internum right lower eyelid: Secondary | ICD-10-CM | POA: Diagnosis not present

## 2021-09-28 ENCOUNTER — Telehealth: Payer: Self-pay | Admitting: Physician Assistant

## 2021-09-28 NOTE — Telephone Encounter (Signed)
Inbound call from patient states she is needing a correct prescription for Linzess of 290mg . She states she only received 145mg . Patient would like a call back

## 2021-09-29 NOTE — Telephone Encounter (Signed)
Called patient assistance program and it stated application is incomplete and they need a copy of patient's Medicare Part D plan. Confirmed on the patient assistance paperwork the prescription is for Linzess 290 mcg. Left message for patient to call office.

## 2021-09-30 NOTE — Telephone Encounter (Signed)
Re-faxed patient assistance including Medicare Part D plan.

## 2021-09-30 NOTE — Telephone Encounter (Signed)
Spoke with patient and she will bring Medicare Part D card to office for me to fax with application. Patient informed samples placed up front for her as well.

## 2021-10-05 ENCOUNTER — Telehealth: Payer: Self-pay | Admitting: Physician Assistant

## 2021-10-05 DIAGNOSIS — Z79899 Other long term (current) drug therapy: Secondary | ICD-10-CM | POA: Diagnosis not present

## 2021-10-05 DIAGNOSIS — M0579 Rheumatoid arthritis with rheumatoid factor of multiple sites without organ or systems involvement: Secondary | ICD-10-CM | POA: Diagnosis not present

## 2021-10-05 NOTE — Telephone Encounter (Signed)
Patient received her letter from [employer] for her assistance.  She said they are missing information that they need to process the application.  Patient would like a phone call from you to clarify and make sure they have everything they need.  Thank you.

## 2021-10-05 NOTE — Telephone Encounter (Signed)
Called patient. She is at an appointment and does not have the form with her but she thinks they are just looking for the Provider Address. Her accent is very strong and it is difficult to make sure what she is saying. She said she will send a MyChart message when she gets home with the information so we can respond to them. Note: Forms are on Heather V's desk if needed.

## 2021-10-06 NOTE — Telephone Encounter (Signed)
Sent mychart message patient to let her know all information was re-faxed on 09/30/21 including all insurance cards.

## 2021-10-11 DIAGNOSIS — H00022 Hordeolum internum right lower eyelid: Secondary | ICD-10-CM | POA: Diagnosis not present

## 2021-10-18 ENCOUNTER — Other Ambulatory Visit: Payer: Self-pay | Admitting: Family Medicine

## 2021-10-18 DIAGNOSIS — E785 Hyperlipidemia, unspecified: Secondary | ICD-10-CM

## 2021-10-18 DIAGNOSIS — G629 Polyneuropathy, unspecified: Secondary | ICD-10-CM

## 2021-10-23 ENCOUNTER — Encounter: Payer: Self-pay | Admitting: Family Medicine

## 2021-10-23 DIAGNOSIS — E039 Hypothyroidism, unspecified: Secondary | ICD-10-CM

## 2021-10-24 MED ORDER — LEVOTHYROXINE SODIUM 112 MCG PO TABS: 112.0000 ug | ORAL_TABLET | Freq: Every day | ORAL | 2 refills | Status: DC

## 2021-10-26 DIAGNOSIS — M4310 Spondylolisthesis, site unspecified: Secondary | ICD-10-CM | POA: Diagnosis not present

## 2021-10-30 ENCOUNTER — Other Ambulatory Visit: Payer: Self-pay | Admitting: Family Medicine

## 2021-10-30 DIAGNOSIS — F331 Major depressive disorder, recurrent, moderate: Secondary | ICD-10-CM

## 2021-10-30 DIAGNOSIS — F411 Generalized anxiety disorder: Secondary | ICD-10-CM

## 2021-11-02 DIAGNOSIS — M4310 Spondylolisthesis, site unspecified: Secondary | ICD-10-CM | POA: Diagnosis not present

## 2021-11-02 DIAGNOSIS — M545 Low back pain, unspecified: Secondary | ICD-10-CM | POA: Diagnosis not present

## 2021-11-02 DIAGNOSIS — M4316 Spondylolisthesis, lumbar region: Secondary | ICD-10-CM | POA: Diagnosis not present

## 2021-11-07 DIAGNOSIS — M0579 Rheumatoid arthritis with rheumatoid factor of multiple sites without organ or systems involvement: Secondary | ICD-10-CM | POA: Diagnosis not present

## 2021-11-07 DIAGNOSIS — R5383 Other fatigue: Secondary | ICD-10-CM | POA: Diagnosis not present

## 2021-11-07 DIAGNOSIS — Z79899 Other long term (current) drug therapy: Secondary | ICD-10-CM | POA: Diagnosis not present

## 2021-11-14 ENCOUNTER — Encounter: Payer: Self-pay | Admitting: Family Medicine

## 2021-11-17 DIAGNOSIS — M4310 Spondylolisthesis, site unspecified: Secondary | ICD-10-CM | POA: Diagnosis not present

## 2021-11-17 DIAGNOSIS — M810 Age-related osteoporosis without current pathological fracture: Secondary | ICD-10-CM | POA: Diagnosis not present

## 2021-11-23 ENCOUNTER — Telehealth: Payer: Self-pay | Admitting: Emergency Medicine

## 2021-11-23 DIAGNOSIS — H00022 Hordeolum internum right lower eyelid: Secondary | ICD-10-CM | POA: Diagnosis not present

## 2021-11-25 DIAGNOSIS — M8589 Other specified disorders of bone density and structure, multiple sites: Secondary | ICD-10-CM | POA: Diagnosis not present

## 2021-11-25 MED ORDER — LORATADINE 10 MG PO TABS: 10.0000 mg | ORAL_TABLET | Freq: Every day | ORAL | 11 refills | Status: DC

## 2021-11-25 NOTE — Telephone Encounter (Signed)
Rx for pt's loratadine has been sent to preferred pharmacy for pt. Attempted to call pt to let her know this was done but unable to reach. Left detailed message for pt letting her know this was done. Nothing further needed.

## 2021-11-29 DIAGNOSIS — M2550 Pain in unspecified joint: Secondary | ICD-10-CM | POA: Diagnosis not present

## 2021-11-29 DIAGNOSIS — M316 Other giant cell arteritis: Secondary | ICD-10-CM | POA: Diagnosis not present

## 2021-11-29 DIAGNOSIS — M8589 Other specified disorders of bone density and structure, multiple sites: Secondary | ICD-10-CM | POA: Diagnosis not present

## 2021-11-29 DIAGNOSIS — G8929 Other chronic pain: Secondary | ICD-10-CM | POA: Diagnosis not present

## 2021-11-29 DIAGNOSIS — M25561 Pain in right knee: Secondary | ICD-10-CM | POA: Diagnosis not present

## 2021-11-29 DIAGNOSIS — M5136 Other intervertebral disc degeneration, lumbar region: Secondary | ICD-10-CM | POA: Diagnosis not present

## 2021-11-29 DIAGNOSIS — M25511 Pain in right shoulder: Secondary | ICD-10-CM | POA: Diagnosis not present

## 2021-11-29 DIAGNOSIS — M25512 Pain in left shoulder: Secondary | ICD-10-CM | POA: Diagnosis not present

## 2021-11-29 DIAGNOSIS — Z6823 Body mass index (BMI) 23.0-23.9, adult: Secondary | ICD-10-CM | POA: Diagnosis not present

## 2021-11-29 DIAGNOSIS — M0589 Other rheumatoid arthritis with rheumatoid factor of multiple sites: Secondary | ICD-10-CM | POA: Diagnosis not present

## 2021-12-07 DIAGNOSIS — M0579 Rheumatoid arthritis with rheumatoid factor of multiple sites without organ or systems involvement: Secondary | ICD-10-CM | POA: Diagnosis not present

## 2021-12-15 DIAGNOSIS — M461 Sacroiliitis, not elsewhere classified: Secondary | ICD-10-CM | POA: Diagnosis not present

## 2021-12-15 DIAGNOSIS — M47816 Spondylosis without myelopathy or radiculopathy, lumbar region: Secondary | ICD-10-CM | POA: Diagnosis not present

## 2021-12-21 DIAGNOSIS — M19012 Primary osteoarthritis, left shoulder: Secondary | ICD-10-CM | POA: Diagnosis not present

## 2021-12-21 DIAGNOSIS — Z96612 Presence of left artificial shoulder joint: Secondary | ICD-10-CM | POA: Diagnosis not present

## 2021-12-22 DIAGNOSIS — M47816 Spondylosis without myelopathy or radiculopathy, lumbar region: Secondary | ICD-10-CM | POA: Diagnosis not present

## 2022-01-04 DIAGNOSIS — Z79899 Other long term (current) drug therapy: Secondary | ICD-10-CM | POA: Diagnosis not present

## 2022-01-04 DIAGNOSIS — M0579 Rheumatoid arthritis with rheumatoid factor of multiple sites without organ or systems involvement: Secondary | ICD-10-CM | POA: Diagnosis not present

## 2022-01-08 ENCOUNTER — Other Ambulatory Visit: Payer: Self-pay | Admitting: Family Medicine

## 2022-01-08 DIAGNOSIS — G4700 Insomnia, unspecified: Secondary | ICD-10-CM

## 2022-01-09 NOTE — Therapy (Signed)
?OUTPATIENT PHYSICAL THERAPY THORACOLUMBAR EVALUATION ? ? ?Patient Name: Jennifer Hull ?MRN: [id number] ?DOB:[birth year], 81 y.o., female ?Today's Date: 01/10/2022 ? ? PT End of Session - 01/10/22 1537   ? ? Visit Number 1   ? Date for PT Re-Evaluation 03/07/22   ? Authorization Type Medicare   ? PT Start Time 1535   ? PT Stop Time 5009   ? PT Time Calculation (min) 39 min   ? Activity Tolerance Patient tolerated treatment well   ? ?  ?  ? ?  ? ? ?Past Medical History:  ?Diagnosis Date  ? Anxiety   ? Arthritis   ? Chronic constipation   ? Depression   ? Hyperlipidemia   ? Hypothyroidism   ? Pneumonia   ? Rheumatoid arthritis ([place]) 2019  ? Temporal arteritis ([place])   ? Thyroid disease   ? ?Past Surgical History:  ?Procedure Laterality Date  ? ABDOMINAL HYSTERECTOMY    ? BREAST EXCISIONAL BIOPSY Left   ? BREAST SURGERY    ? biopsy  ? COLONOSCOPY    ? REVERSE SHOULDER ARTHROPLASTY Left 06/09/2021  ? Procedure: REVERSE SHOULDER ARTHROPLASTY;  Surgeon: Tania Ade, MD;  Location: [facility];  Service: Orthopedics;  Laterality: Left;  ? SHOULDER SURGERY Left 06/09/2021  ? TEMPORAL ARTERY BIOPSY / LIGATION Bilateral 2014  ? TONSILLECTOMY    ? TOTAL SHOULDER ARTHROPLASTY Right 11/22/2017  ? Procedure: RIGHT REVERSE TOTAL SHOULDER ARTHROPLASTY;  Surgeon: Tania Ade, MD;  Location: [facility];  Service: Orthopedics;  Laterality: Right;  ? ?Patient Active Problem List  ? Diagnosis Date Noted  ? 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 Chronic rhinitis 05/13/2021  ? Chronic pain disorder 01/24/2021  ? Pulmonary nodules 11/24/2020  ? Chronic cough 07/23/2020  ? Atherosclerosis of aorta ([place]) 07/23/2020  ? Temporal arteritis ([place]) 09/01/2019  ? RA (rheumatoid arthritis) ([place]) 09/01/2019  ? Peripheral neuropathy 07/21/2019  ? Vitamin D deficiency, unspecified 01/01/2019  ? Constipation 09/02/2018  ? Epistaxis, recurrent 12/25/2017  ? S/P reverse total shoulder arthroplasty, 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  ? GERD  (gastroesophageal reflux disease) 12/26/2016  ? Generalized anxiety disorder 08/28/2016  ? Hypothyroidism 07/27/2016  ? Major depressive disorder, recurrent episode, moderate ([place]) 07/27/2016  ? Hyperlipidemia 07/27/2016  ? Insomnia 07/27/2016  ? ? ?PCP: [place], Betty G, MD ? ?REFERRING PROVIDER: Dr. Earnie Larsson ? ?REFERRING DIAG: M47.816 Lumbar facet arthropathy ? ?THERAPY DIAG:  ?Lumbar facet arthropathy ? ?ONSET DATE: 10/09/21 ? ?SUBJECTIVE:                                                                                                                                                                                          ? ?  SUBJECTIVE STATEMENT: ?I hurt the right side (a little left side)  of my back down right LE to ankle 5-6 years ago after a fall;  I have many problems, had injection 20 days ago but 20 days ago pain returned but less intense ?PERTINENT HISTORY:  ?Left and total shoulder; RA; right knee issue;  recent bone density "normal for my age" ? ?PAIN:  ?Are you having pain? PAIN:  ?Are you having pain? Yes ?NPRS scale: 3/10 ?Pain location: right > left back pain and right LE  ? ?Aggravating factors: with movement; standing; walking (holds husband in community) but good with grocery cart; difficulty sleeping but better since injection ?Relieving factors: sitting; sitting in recliner; massager  ? ? ? ?PRECAUTIONS: Other: Rheumatoid arthritis  ? ?WEIGHT BEARING RESTRICTIONS No ? ?FALLS:  ?Has patient fallen in last 6 months? No ? ?LIVING ENVIRONMENT: ?Lives with: lives with their family and lives with their spouse ?Lives in: House/apartment ?Stairs: yes (my husband fell) bedroom downstairs; orchids upstairs; I use stair lift chair ?Has following equipment at home: None; has a cane use when in a lot of pain ? ?OCCUPATION: retired ? ?PLOF: Independent with basic ADLs ? ?PATIENT GOALS ;  I don't have much faith in the therapy I don't know why he sent me ? ? ?OBJECTIVE:  ? ?DIAGNOSTIC FINDINGS:  ?MRI facet  arthropathy "he was thinking of surgery but he preferred not to do therapy" ? ?PATIENT SURVEYS:  ?FOTO 45% ? ? ? ?COGNITION: ? Overall cognitive status: Within functional limits for tasks assessed   ?  ?SENSATION: ?WFL ? ?MUSCLE LENGTH: ?Hamstrings: Right 90 deg; Left 90 deg ?POSTURE:  ?WFLs ? ? ?LUMBAR ROM:   Flexion directional preference ? ?Active  A/PROM  ?01/10/22  ?Flexion WFLs  ?Extension 5  ?Right lateral flexion 20  ?Left lateral flexion 20  ?Right rotation   ?Left rotation   ? (Blank rows = not tested) ? ?LE ROM:  [facility] bil ? ?LE MMT: ? ?MMT Right ?01/10/2022 Left ?01/10/2022  ?Hip flexion 4+ 4+  ?Hip extension    ?Hip abduction 4 4+  ?Hip adduction    ?Hip internal rotation    ?Hip external rotation    ?Knee flexion 4 4  ?Knee extension    ?Ankle dorsiflexion    ?Ankle plantarflexion    ?Ankle inversion    ?Ankle eversion    ? (Blank rows = not tested) ? ?LUMBAR SPECIAL TESTS:  ?Straight leg raise test: Negative ? ?FUNCTIONAL TESTS:  ?Difficulty rising from standard chair without UE assist ? ?GAIT: ?Distance walked: 50 ?Assistive device utilized: None ?Level of assistance: Modified independence ?Comments: no device but reaches for objects to touch for balance ? ? ? ?TODAY'S TREATMENT  ?Discussion of facet "gapping" strategies for pain relief using spine model ? ? ?PATIENT EDUCATION:  ?Education details: flexion biased initial HEP ?Person educated: Patient ?Education method: Explanation, Demonstration, and Handouts ?Education comprehension: verbalized understanding ? ? ?HOME EXERCISE PROGRAM: ?Access Code: [email or web address] ?URL: [email or web address] ?Date: 01/10/2022 ?Prepared by: Ruben Im ? ?Exercises ?- Seated Cat Cow  - 1 x daily - 7 x weekly - 1 sets - 10 reps ?- Supine Double Knee to Chest  - 1 x daily - 7 x weekly - 1 sets - 10 reps ?- Supine Posterior Pelvic Tilt  - 1 x daily - 7 x weekly - 1 sets - 10 reps ?- Supine Bridge  - 1 x daily - 7 x weekly - 1 sets - 10 reps ? ?  ASSESSMENT: ? ?CLINICAL  IMPRESSION: ?Patient is a 81 y.o. female who was seen today for physical therapy evaluation and treatment for lumbar facet arthropathy.  She has RA and reports multi region body pain.  This issue with back pain and LE symptoms have been progressively worsening over 5-6 years.  She reports some relief since a spinal injection about 20 days ago.  She responds well to flexion biased movements.  She would benefit from PT to address pain, improve strength and for patient education.   ? ? ?OBJECTIVE IMPAIRMENTS decreased activity tolerance, decreased mobility, difficulty walking, decreased strength, and pain.  ? ?ACTIVITY LIMITATIONS cleaning, community activity, meal prep, laundry, and shopping.  ? ?PERSONAL FACTORS Past/current experiences, Time since onset of injury/illness/exacerbation, and 1 comorbidity: RA  are also affecting patient's functional outcome.  ? ? ?REHAB POTENTIAL: Good ? ?CLINICAL DECISION MAKING: Stable/uncomplicated ? ?EVALUATION COMPLEXITY: Low ? ? ?GOALS: ?Goals reviewed with patient? Yes ? ?SHORT TERM GOALS: Target date: 02/07/2022 ? ?The patient will demonstrate knowledge of basic self care strategies and exercises to promote healing   ?Baseline: ?Goal status: INITIAL ? ?2.  The patient will report a 40% improvement in pain levels with functional activities which are currently difficult including standing to cook ?Baseline:  ?Goal status: INITIAL ? ? ?LONG TERM GOALS: Target date: 03/07/2022 ? ?The patient will be independent in a safe self progression of a home exercise program to promote further recovery of function   ?Baseline:  ?Goal status: INITIAL ? ?2.  The patient will report a 65% improvement in pain levels with functional activities which are currently difficult including standing to cook and other household chores ?Baseline:  ?Goal status: INITIAL ? ?3.  Improved lumbo/pelvic/hip strength to grossly 4+/5 needed for walking longer periods of time ?Baseline:  ?Goal status: INITIAL ? ?4.   FOTO score improved from 45% to 56% indicating improved function with less pain ?Baseline:  ?Goal status: INITIAL ? ? ? ?PLAN: ?PT FREQUENCY: 1x/week ? ?PT DURATION: 8 weeks ? ?PLANNED INTERVENTIONS: Therapeutic exercis

## 2022-01-09 NOTE — Telephone Encounter (Signed)
-   last filled 11/21/21 ?- next office visit scheduled for 01/18/22 ?- Rx pharmacy has is expired ?

## 2022-01-10 ENCOUNTER — Ambulatory Visit: Payer: Medicare Other | Attending: Neurosurgery | Admitting: Physical Therapy

## 2022-01-10 ENCOUNTER — Encounter: Payer: Self-pay | Admitting: Physical Therapy

## 2022-01-10 DIAGNOSIS — M47816 Spondylosis without myelopathy or radiculopathy, lumbar region: Secondary | ICD-10-CM | POA: Insufficient documentation

## 2022-01-10 DIAGNOSIS — M6281 Muscle weakness (generalized): Secondary | ICD-10-CM | POA: Diagnosis not present

## 2022-01-10 DIAGNOSIS — M5459 Other low back pain: Secondary | ICD-10-CM

## 2022-01-11 ENCOUNTER — Ambulatory Visit: Payer: Medicare Other | Admitting: Rehabilitative and Restorative Service Providers"

## 2022-01-12 ENCOUNTER — Other Ambulatory Visit: Payer: Self-pay | Admitting: Family Medicine

## 2022-01-12 DIAGNOSIS — J343 Hypertrophy of nasal turbinates: Secondary | ICD-10-CM | POA: Diagnosis not present

## 2022-01-12 DIAGNOSIS — H6122 Impacted cerumen, left ear: Secondary | ICD-10-CM | POA: Diagnosis not present

## 2022-01-12 DIAGNOSIS — G629 Polyneuropathy, unspecified: Secondary | ICD-10-CM

## 2022-01-12 DIAGNOSIS — E785 Hyperlipidemia, unspecified: Secondary | ICD-10-CM

## 2022-01-12 DIAGNOSIS — R0981 Nasal congestion: Secondary | ICD-10-CM | POA: Diagnosis not present

## 2022-01-12 DIAGNOSIS — J302 Other seasonal allergic rhinitis: Secondary | ICD-10-CM | POA: Diagnosis not present

## 2022-01-12 DIAGNOSIS — Z87891 Personal history of nicotine dependence: Secondary | ICD-10-CM | POA: Diagnosis not present

## 2022-01-16 ENCOUNTER — Ambulatory Visit: Payer: Medicare Other | Admitting: Physical Therapy

## 2022-01-16 ENCOUNTER — Encounter: Payer: Self-pay | Admitting: Physical Therapy

## 2022-01-16 DIAGNOSIS — M47816 Spondylosis without myelopathy or radiculopathy, lumbar region: Secondary | ICD-10-CM | POA: Diagnosis not present

## 2022-01-16 DIAGNOSIS — M6281 Muscle weakness (generalized): Secondary | ICD-10-CM

## 2022-01-16 DIAGNOSIS — M5459 Other low back pain: Secondary | ICD-10-CM

## 2022-01-16 NOTE — Progress Notes (Signed)
?HPI: ?Jennifer Hull is a 81 y.o. female, who is here today for follow up. ?She was last seen on 07/19/21. ?Anxiety, insomnia, and depression on Sertraline , which she recently increased from 150 mg to 200 mg daily. ?She is also on Temazepam 15 mg at bedtime as needed, she has been taking it daily for the past few days. ?Her husband fell when going up stairs,she found him on the floor, did not remember what happened, and bleeding from scalp wound,he was evaluated in the ER and now back to his baseline. This event exacerbated anxiety, so she increased Sertraline dose. ?She takes Temazepam for insomnia. ? ?  01/18/2022  ?  4:15 PM 07/19/2021  ?  2:52 PM 03/31/2021  ?  3:37 PM 01/24/2021  ?  3:47 PM 01/30/2020  ?  2:55 PM  ?Depression screen PHQ 2/9  ?Decreased Interest 0 0 0 0 1  ?Down, Depressed, Hopeless 0 0 0 0 1  ?PHQ - 2 Score 0 0 0 0 2  ?Altered sleeping '2 2  3 2  '$ ?Tired, decreased energy 0 '1  2 1  '$ ?Change in appetite 0 0  0 0  ?Feeling bad or failure about yourself  0 0  0 0  ?Trouble concentrating 2 0  2 2  ?Moving slowly or fidgety/restless 0 0  0 0  ?Suicidal thoughts 0 0  0 0  ?PHQ-9 Score '4 3  7 7  '$ ?Difficult doing work/chores Not difficult at all   Somewhat difficult Somewhat difficult  ? ?Hypothyroidism: ?Currently she is on Levothyroxine 112 mcg daily. ?She has not noted dysphagia, palpitations, changes in bowel habits, cold/heat intolerance, or abnormal weight loss. ? ?Lab Results  ?Component Value Date  ? TSH 1.01 01/24/2021  ? ?Constipation: Linzess 290 mcg and Miralax do not help much. ?Trulance 3 mg did help but not covered by her health insurance. ?Stool is soft. ? ?HLD: She is on Atorvastatin 20 mg daily and low fat diet. ?Aortic atherosclerosis seen on CXR in 02/2019. ? ?Lab Results  ?Component Value Date  ? CHOL 160 01/30/2020  ? HDL 57.60 01/30/2020  ? LDLCALC 116 (H) 02/24/2019  ? LDLDIRECT 67.0 01/30/2020  ? TRIG 243.0 (H) 01/30/2020  ? CHOLHDL 3 01/30/2020  ? ?Left wrist edema and numbness  for the past 2-3 weeks, improving. ?Feeling achy all over and fatigue, she described feeling like when she is getting a cold. No associated fever or respiratory symptoms. ? ?RA and generalized OA, follows with rheumatologist. ?She had labs done recently, 11/07/21: CBC and CMP. ?Concerned about mildly elevated glucose, 110. ? ?Lower back pain radiated to LE's and burning sensation,both worse for the past few days, she decreased Gabapentin 300 mg from tid to to once daily. ?Epidural injections do not help much and according to pt, because osteopenia surgical treatment is not an option.  ?Follows with Dr Annette Stable. ? 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 ? ?Also pruritus and burning like sensation right-sided lower back. No rash or skin changes. Problem has been going on for years. Her husband has a Rx for triamcinolone cream 0.1% and it helps, she is using it as needed, not frequent. ? ?Since her last visit she has seen ENT, Astelin nasal spray was added. ? ?Review of Systems  ?Constitutional:  Positive for fatigue. Negative for activity change and appetite change.  ?  HENT:  Positive for congestion and rhinorrhea.   ?Respiratory:  Negative for cough, shortness of breath and wheezing.   ?Cardiovascular:  Negative for chest pain, palpitations and leg swelling.  ?Gastrointestinal:  Negative for abdominal pain, nausea and vomiting.  ?Rest see pertinent positives and negatives per HPI. ? ?Current Outpatient Medications on File Prior to Visit  ?Medication Sig Dispense Refill  ? acetaminophen (TYLENOL) 500 MG tablet Take 1,000 mg by mouth every 8 (eight) hours as needed for moderate pain.    ? atorvastatin (LIPITOR) 20 MG tablet TAKE 1 TABLET BY MOUTH EVERY DAY 90 tablet 3  ? Calcium Carb-Cholecalciferol (CALCIUM 600+D3 PO) Take 1 tablet by mouth daily with lunch.    ?  cholecalciferol (VITAMIN D3) 25 MCG (1000 UNIT) tablet Take 1,000 Units by mouth daily.    ? fluticasone (FLONASE) 50 MCG/ACT nasal spray Place 2 sprays into both nostrils at bedtime.    ? folic acid (FOLVITE) 546 MCG tablet Take 800 mcg by mouth daily.    ? gabapentin (NEURONTIN) 300 MG capsule TAKE 1 CAPSULE BY MOUTH 3 TIMES DAILY 90 capsule 3  ? levothyroxine (EUTHYROX) 112 MCG tablet Take 1 tablet (112 mcg total) by mouth daily before breakfast. 90 tablet 2  ? linaclotide (LINZESS) 290 MCG CAPS capsule Take 290 mcg by mouth daily before breakfast.    ? loratadine (CLARITIN) 10 MG tablet Take 1 tablet (10 mg total) by mouth daily. 30 tablet 11  ? magnesium oxide (MAG-OX) 400 MG tablet Take 400 mg by mouth 2 (two) times daily.    ? Melatonin 10 MG CAPS Take 10 mg by mouth at bedtime.    ? Omega-3 Fatty Acids (FISH OIL) 1000 MG CAPS Take 1,000 mg by mouth daily.    ? pantoprazole (PROTONIX) 40 MG tablet Take 1 tablet (40 mg total) by mouth daily. 90 tablet 3  ? sertraline (ZOLOFT) 100 MG tablet TAKE 1 AND 1/2 TABLETS BY MOUTH ONCE DAILY 135 tablet 2  ? temazepam (RESTORIL) 15 MG capsule TAKE 1 CAPSULE BY MOUTH AT BEDTIME AS NEEDED SLEEP 30 capsule 3  ? Tocilizumab (ACTEMRA IV) Inject into the vein every 30 (thirty) days.    ? TURMERIC CURCUMIN PO Take 3,000 mg by mouth daily.    ? zinc gluconate 50 MG tablet Take 50 mg by mouth daily.    ? ?No current facility-administered medications on file prior to visit.  ? ?Past Medical History:  ?Diagnosis Date  ? Anxiety   ? Arthritis   ? Chronic constipation   ? Depression   ? Hyperlipidemia   ? Hypothyroidism   ? Pneumonia   ? Rheumatoid arthritis ([place]) 2019  ? Temporal arteritis ([place])   ? Thyroid disease   ? ?Allergies  ?Allergen Reactions  ? Tdap [Tetanus-Diphth-Acell Pertussis] Other (See Comments)  ?  SEVERE ALLERGIC REACTIONS (NECK STIFFNESS/HIGH FEVER/ETC)  ? Tetanus Toxoid, Adsorbed   ?  Neck stiffness, and high fever ?Other reaction(s): Other (See Comments) ?Neck  stiffness, and high fever  ? Hydroxychloroquine Sulfate Rash  ? Covid-19 ([place]) Vaccine Therapist, music) [Covid-19 ([place]) Vaccine] Rash  ? ?Social History  ? ?Socioeconomic History  ? Marital status: Married  ?  Spouse name: Not on file  ? Number of children: Not on file  ? Years of education: Not on file  ? Highest education level: Not on file  ?Occupational History  ? Not on file  ?Tobacco Use  ? Smoking status: Former  ?  Packs/day: 1.50  ?  Years: 8.00  ?  Pack years: 12.00  ?  Types: Cigarettes  ?  Quit date: 42  ?  Years since quitting: 45.3  ? Smokeless tobacco: Never  ? Tobacco comments:  ?  smoked infrequently when she was young; quit 81 yo   ?Vaping Use  ? Vaping Use: Never used  ?Substance and Sexual Activity  ? Alcohol use: No  ? Drug use: No  ? Sexual activity: Yes  ?  Partners: Male  ?  Birth control/protection: Surgical  ?Other Topics Concern  ? Not on file  ?Social History Narrative  ? Not on file  ? ?Social Determinants of Health  ? ?Financial Resource Strain: Not on file  ?Food Insecurity: Not on file  ?Transportation Needs: Not on file  ?Physical Activity: Not on file  ?Stress: Not on file  ?Social Connections: Not on file  ? ?Vitals:  ? 01/18/22 1522  ?BP: 118/70  ?Pulse: 75  ?Resp: 16  ?SpO2: 97%  ? ?Body mass index is 23 kg/m?. ? ?Physical Exam ?Vitals and nursing note reviewed.  ?Constitutional:   ?   General: She is not in acute distress. ?   Appearance: She is well-developed.  ?HENT:  ?   Head: Normocephalic and atraumatic.  ?   Mouth/Throat:  ?   Mouth: Mucous membranes are moist.  ?   Pharynx: Oropharynx is clear.  ?Eyes:  ?   Conjunctiva/sclera: Conjunctivae normal.  ?Cardiovascular:  ?   Rate and Rhythm: Normal rate and regular rhythm.  ?   Heart sounds: No murmur heard. ?   Comments: DP pulses present. ?Pulmonary:  ?   Effort: Pulmonary effort is normal. No respiratory distress.  ?   Breath sounds: Normal breath sounds.  ?Abdominal:  ?   Palpations: Abdomen is soft. There is no hepatomegaly  or mass.  ?   Tenderness: There is no abdominal tenderness.  ?Lymphadenopathy:  ?   Cervical: No cervical adenopathy.  ?Skin: ?   General: Skin is warm.  ?   Findings: No erythema or rash.  ?Neurological:  ?   Gene

## 2022-01-16 NOTE — Therapy (Signed)
?OUTPATIENT PHYSICAL THERAPY TREATMENT NOTE ? ? ?Patient Name: Jennifer Hull ?MRN: [id number] ?DOB:[birth year], 81 y.o., female ?Today's Date: 01/16/2022 ? ?PCP: Martinique, Betty G, MD ?REFERRING PROVIDER: Earnie Larsson, MD ? ?END OF SESSION:  ? PT End of Session - 01/16/22 1445   ? ? Visit Number 2   ? Date for PT Re-Evaluation 03/07/22   ? Authorization Type Medicare   ? PT Start Time 7425   ? PT Stop Time 1515   ? PT Time Calculation (min) 30 min   ? Activity Tolerance Patient tolerated treatment well   ? Behavior During Therapy [facility] for tasks assessed/performed   ? ?  ?  ? ?  ? ? ?Past Medical History:  ?Diagnosis Date  ? Anxiety   ? Arthritis   ? Chronic constipation   ? Depression   ? Hyperlipidemia   ? Hypothyroidism   ? Pneumonia   ? Rheumatoid arthritis ([place]) 2019  ? Temporal arteritis ([place])   ? Thyroid disease   ? ?Past Surgical History:  ?Procedure Laterality Date  ? ABDOMINAL HYSTERECTOMY    ? BREAST EXCISIONAL BIOPSY Left   ? BREAST SURGERY    ? biopsy  ? COLONOSCOPY    ? REVERSE SHOULDER ARTHROPLASTY Left 06/09/2021  ? Procedure: REVERSE SHOULDER ARTHROPLASTY;  Surgeon: Tania Ade, MD;  Location: [facility];  Service: Orthopedics;  Laterality: Left;  ? SHOULDER SURGERY Left 06/09/2021  ? TEMPORAL ARTERY BIOPSY / LIGATION Bilateral 2014  ? TONSILLECTOMY    ? TOTAL SHOULDER ARTHROPLASTY Right 11/22/2017  ? Procedure: RIGHT REVERSE TOTAL SHOULDER ARTHROPLASTY;  Surgeon: Tania Ade, MD;  Location: [place];  Service: Orthopedics;  Laterality: Right;  ? ?Patient Active Problem List  ? Diagnosis Date Noted  ? 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 Chronic rhinitis 05/13/2021  ? Chronic pain disorder 01/24/2021  ? Pulmonary nodules 11/24/2020  ? Chronic cough 07/23/2020  ? Atherosclerosis of aorta ([place]) 07/23/2020  ? Temporal arteritis ([place]) 09/01/2019  ? RA (rheumatoid arthritis) ([place]) 09/01/2019  ? Peripheral neuropathy 07/21/2019  ? Vitamin D deficiency, unspecified 01/01/2019  ?  Constipation 09/02/2018  ? Epistaxis, recurrent 12/25/2017  ? S/P reverse total shoulder arthroplasty, 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  ? GERD (gastroesophageal reflux disease) 12/26/2016  ? Generalized anxiety disorder 08/28/2016  ? Hypothyroidism 07/27/2016  ? Major depressive disorder, recurrent episode, moderate ([place]) 07/27/2016  ? Hyperlipidemia 07/27/2016  ? Insomnia 07/27/2016  ? ? ?REFERRING DIAG: Dr. Earnie Larsson ? ?THERAPY DIAG:  ?Other low back pain ? ?Muscle weakness (generalized) ? ?PERTINENT HISTORY: Left and total shoulder; RA; right knee issue;  recent bone density "normal for my age" ? ?PRECAUTIONS: Other: Rheumatoid arthritis  ? ?SUBJECTIVE: Did ok after evaluation. Doing exercises everyother day. I worked in garden yesterday so my back muscles are little sore.  ? ?PAIN:  ?Are you having pain? No ? ?OBJECTIVE:  ?  ?DIAGNOSTIC FINDINGS:  ?MRI facet arthropathy "he was thinking of surgery but he preferred not to do therapy" ?  ?PATIENT SURVEYS:  ?FOTO 45% ?  ?  ?  ?COGNITION: ?          Overall cognitive status: Within functional limits for tasks assessed               ?           ?SENSATION: ?WFL ?  ?MUSCLE LENGTH: ?Hamstrings: Right 90 deg; Left 90 deg ?POSTURE:  ?WFLs ?  ?  ?LUMBAR ROM:   Flexion directional preference ?  ?Active  A/PROM  ?01/10/22  ?Flexion WFLs  ?Extension 5  ?Right lateral flexion 20  ?Left lateral flexion 20  ?Right rotation    ?Left rotation    ? (Blank rows = not tested) ?  ?LE ROM:  WLFs bil ?  ?LE MMT: ?  ?MMT Right ?01/10/2022 Left ?01/10/2022  ?Hip flexion 4+ 4+  ?Hip extension      ?Hip abduction 4 4+  ?Hip adduction      ?Hip internal rotation      ?Hip external rotation      ?Knee flexion 4 4  ?Knee extension      ?Ankle dorsiflexion      ?Ankle plantarflexion      ?Ankle inversion      ?Ankle eversion      ? (Blank rows = not tested) ?  ?LUMBAR SPECIAL TESTS:  ?Straight leg raise test: Negative ?  ?FUNCTIONAL TESTS:  ?Difficulty rising from standard chair without UE assist ?   ?GAIT: ?Distance walked: 50 ?Assistive device utilized: None ?Level of assistance: Modified independence ?Comments: no device but reaches for objects to touch for balance ?  ?  ?  ?TODAY'S TREATMENT  ? ?01/16/22: ?MHP to low back for exercises ?Review of initial HEP: SKC Bil 3x 20 sec each pt with re-instruction; DKC 2x 20 sec, Supine bridge 10x 3 sec hold, LT LTR 2x 20 sec, PPT 10x2 pt had trouble keeping her knees apart. Initially PTA guided manually then pt could do independently.  ?Nustep L1 x 4 min to end session.  ?Seated Cat/cow: re-instruction required, pt has difficulty with rhythm.  ? ? ?Discussion of facet "gapping" strategies for pain relief using spine model ?  ?  ?PATIENT EDUCATION:  ?Education details: flexion biased initial HEP ?Person educated: Patient ?Education method: Explanation, Demonstration, and Handouts ?Education comprehension: verbalized understanding ?  ?  ?HOME EXERCISE PROGRAM: ?Access Code: [email or web address] ?URL: [email or web address] ?Date: 01/10/2022 ?Prepared by: Ruben Im ?  ?Exercises ?- Seated Cat Cow  - 1 x daily - 7 x weekly - 1 sets - 10 reps ?- Supine Double Knee to Chest  - 1 x daily - 7 x weekly - 1 sets - 10 reps ?- Supine Posterior Pelvic Tilt  - 1 x daily - 7 x weekly - 1 sets - 10 reps ?- Supine Bridge  - 1 x daily - 7 x weekly - 1 sets - 10 reps ?  ?ASSESSMENT: ?  ?CLINICAL IMPRESSION: ?Pt arrives for first time follow up after initial evaluation. Pt doing HEP every other day.  Pt reports no back pain but sore muscles from an hour of gardening. Pt demonstrates lateral hip weakness during post pelvic tilt, knees collapse. PTA encouraged pt to do HEP every day. Pt reports her soreness was gone at end of session.  ?  ?OBJECTIVE IMPAIRMENTS decreased activity tolerance, decreased mobility, difficulty walking, decreased strength, and pain.  ?  ?ACTIVITY LIMITATIONS cleaning, community activity, meal prep, laundry, and shopping.  ?  ?PERSONAL FACTORS Past/current  experiences, Time since onset of injury/illness/exacerbation, and 1 comorbidity: RA  are also affecting patient's functional outcome.  ?  ?  ?REHAB POTENTIAL: Good ?  ?CLINICAL DECISION MAKING: Stable/uncomplicated ?  ?EVALUATION COMPLEXITY: Low ?  ?  ?GOALS: ?Goals reviewed with patient? Yes ?  ?SHORT TERM GOALS: Target date: 02/07/2022 ?  ?The patient will demonstrate knowledge of basic self care strategies and exercises to promote healing   ?Baseline: ?Goal status: INITIAL ?  ?2.  The patient will report  a 40% improvement in pain levels with functional activities which are currently difficult including standing to cook ?Baseline:  ?Goal status: INITIAL ?  ?  ?LONG TERM GOALS: Target date: 03/07/2022 ?  ?The patient will be independent in a safe self progression of a home exercise program to promote further recovery of function   ?Baseline:  ?Goal status: INITIAL ?  ?2.  The patient will report a 65% improvement in pain levels with functional activities which are currently difficult including standing to cook and other household chores ?Baseline:  ?Goal status: INITIAL ?  ?3.  Improved lumbo/pelvic/hip strength to grossly 4+/5 needed for walking longer periods of time ?Baseline:  ?Goal status: INITIAL ?  ?4.  FOTO score improved from 45% to 56% indicating improved function with less pain ?Baseline:  ?Goal status: INITIAL ?  ?  ?  ?PLAN: ?PT FREQUENCY: 1x/week ?  ?PT DURATION: 8 weeks ?  ?PLANNED INTERVENTIONS: Therapeutic exercises, Therapeutic activity, Neuromuscular re-education, Balance training, Gait training, Patient/Family education, Joint mobilization, Aquatic Therapy, Dry Needling, Electrical stimulation, Spinal mobilization, Cryotherapy, Moist heat, Taping, Traction, Ultrasound, Ionotophoresis '4mg'$ /ml Dexamethasone, and Manual therapy. ?  ?PLAN FOR NEXT SESSION: neutral and flexion biased exercise; Nu-Step patient education; pain relieving, hip strength consider supine clamshell for additional HEP on next  visit.  ? ? ? ? ?(Copy Eval's Objective through Plan section here) ? ? ?Lavontae Cornia, PTA ?01/16/2022, 3:15 PM ? ?   ?

## 2022-01-17 ENCOUNTER — Ambulatory Visit: Payer: Medicare Other | Admitting: Family Medicine

## 2022-01-18 ENCOUNTER — Encounter: Payer: Self-pay | Admitting: Family Medicine

## 2022-01-18 ENCOUNTER — Ambulatory Visit (INDEPENDENT_AMBULATORY_CARE_PROVIDER_SITE_OTHER): Payer: Medicare Other | Admitting: Family Medicine

## 2022-01-18 VITALS — BP 118/70 | HR 75 | Resp 16 | Ht 64.0 in | Wt 134.0 lb

## 2022-01-18 DIAGNOSIS — M816 Localized osteoporosis [Lequesne]: Secondary | ICD-10-CM | POA: Diagnosis not present

## 2022-01-18 DIAGNOSIS — E039 Hypothyroidism, unspecified: Secondary | ICD-10-CM

## 2022-01-18 DIAGNOSIS — M069 Rheumatoid arthritis, unspecified: Secondary | ICD-10-CM

## 2022-01-18 DIAGNOSIS — K5900 Constipation, unspecified: Secondary | ICD-10-CM | POA: Diagnosis not present

## 2022-01-18 DIAGNOSIS — I700 Atherosclerosis of aorta: Secondary | ICD-10-CM

## 2022-01-18 DIAGNOSIS — F331 Major depressive disorder, recurrent, moderate: Secondary | ICD-10-CM

## 2022-01-18 DIAGNOSIS — G4700 Insomnia, unspecified: Secondary | ICD-10-CM

## 2022-01-18 DIAGNOSIS — F411 Generalized anxiety disorder: Secondary | ICD-10-CM | POA: Diagnosis not present

## 2022-01-18 DIAGNOSIS — G629 Polyneuropathy, unspecified: Secondary | ICD-10-CM | POA: Diagnosis not present

## 2022-01-18 DIAGNOSIS — E785 Hyperlipidemia, unspecified: Secondary | ICD-10-CM

## 2022-01-18 MED ORDER — BISACODYL 10 MG RE SUPP: RECTAL | 1 refills | Status: AC

## 2022-01-18 NOTE — Patient Instructions (Addendum)
A few things to remember from today's visit: ? ? ?Hypothyroidism, unspecified type ? ?Insomnia, unspecified type ? ?Constipation, unspecified constipation type - Plan: bisacodyl (DULCOLAX) 10 MG suppository ? ?Hyperlipidemia, unspecified hyperlipidemia type ? ?Atherosclerosis of aorta ([place]) ? ?Localized osteoporosis without current pathological fracture ? ?If you need refills please call your pharmacy. ?Do not use My Chart to request refills or for acute issues that need immediate attention. ?  ?Will try to get Prolia approved by insurance. ?Labs tomorrow or next week. ?Continue Temazepam daily as needed for insomnia and/or anxiety. Sertraline 150 mg daily. ?Left wrist splint at bedtime may help with numbness. ? ?Please be sure medication list is accurate. ?If a new problem present, please set up appointment sooner than planned today. ? ? ? ? ? ? ? ?

## 2022-01-19 ENCOUNTER — Other Ambulatory Visit (INDEPENDENT_AMBULATORY_CARE_PROVIDER_SITE_OTHER): Payer: Medicare Other

## 2022-01-19 DIAGNOSIS — M816 Localized osteoporosis [Lequesne]: Secondary | ICD-10-CM

## 2022-01-19 DIAGNOSIS — E039 Hypothyroidism, unspecified: Secondary | ICD-10-CM

## 2022-01-19 DIAGNOSIS — E785 Hyperlipidemia, unspecified: Secondary | ICD-10-CM

## 2022-01-19 DIAGNOSIS — R739 Hyperglycemia, unspecified: Secondary | ICD-10-CM | POA: Diagnosis not present

## 2022-01-19 LAB — LIPID PANEL
Cholesterol: 147 mg/dL (ref 0–200)
HDL: 69.5 mg/dL (ref >39.00)
LDL Cholesterol: 65 mg/dL (ref 0–99)
NonHDL: 77.12
Total CHOL/HDL Ratio: 2
Triglycerides: 60 mg/dL (ref 0.0–149.0)
VLDL: 12 mg/dL (ref 0.0–40.0)

## 2022-01-19 LAB — VITAMIN D 25 HYDROXY (VIT D DEFICIENCY, FRACTURES): VITD: 52.63 ng/mL (ref 30.00–100.00)

## 2022-01-19 LAB — TSH: TSH: 1.36 u[IU]/mL (ref 0.35–5.50)

## 2022-01-19 LAB — HEMOGLOBIN A1C: Hgb A1c MFr Bld: 5.5 % (ref 4.6–6.5)

## 2022-01-21 ENCOUNTER — Encounter: Payer: Self-pay | Admitting: Family Medicine

## 2022-01-23 ENCOUNTER — Telehealth: Payer: Self-pay

## 2022-01-23 NOTE — Telephone Encounter (Signed)
Benefits verification started for patient.  ?

## 2022-01-25 ENCOUNTER — Ambulatory Visit: Payer: Medicare Other

## 2022-01-25 DIAGNOSIS — M6281 Muscle weakness (generalized): Secondary | ICD-10-CM

## 2022-01-25 DIAGNOSIS — M5459 Other low back pain: Secondary | ICD-10-CM

## 2022-01-25 DIAGNOSIS — R262 Difficulty in walking, not elsewhere classified: Secondary | ICD-10-CM

## 2022-01-25 DIAGNOSIS — M47816 Spondylosis without myelopathy or radiculopathy, lumbar region: Secondary | ICD-10-CM | POA: Diagnosis not present

## 2022-01-25 NOTE — Telephone Encounter (Signed)
Pt is sch for 01-27-2022 230 pm for injection ?

## 2022-01-25 NOTE — Therapy (Addendum)
OUTPATIENT PHYSICAL THERAPY TREATMENT NOTE   Patient Name: Jennifer Hull MRN: [id number] DOB:1940-12-30, 81 y.o., female Today's Date: 01/25/2022  PCP: Martinique, Betty G, MD REFERRING PROVIDER: Earnie Larsson, MD  END OF SESSION:   PT End of Session - 01/25/22 1526     Visit Number 3    Date for PT Re-Evaluation 03/07/22    Authorization Type Medicare    PT Start Time 8127    PT Stop Time 1525    PT Time Calculation (min) 40 min    Activity Tolerance Patient tolerated treatment well    Behavior During Therapy [facility] for tasks assessed/performed              Past Medical History:  Diagnosis Date   Anxiety    Arthritis    Chronic constipation    Depression    Hyperlipidemia    Hypothyroidism    Pneumonia    Rheumatoid arthritis ([place]) 2019   Temporal arteritis ([place])    Thyroid disease    Past Surgical History:  Procedure Laterality Date   ABDOMINAL HYSTERECTOMY     BREAST EXCISIONAL BIOPSY Left    BREAST SURGERY     biopsy   COLONOSCOPY     REVERSE SHOULDER ARTHROPLASTY Left 06/09/2021   Procedure: REVERSE SHOULDER ARTHROPLASTY;  Surgeon: Tania Ade, MD;  Location: [facility];  Service: Orthopedics;  Laterality: Left;   SHOULDER SURGERY Left 06/09/2021   TEMPORAL ARTERY BIOPSY / LIGATION Bilateral 2014   TONSILLECTOMY     TOTAL SHOULDER ARTHROPLASTY Right 11/22/2017   Procedure: RIGHT REVERSE TOTAL SHOULDER ARTHROPLASTY;  Surgeon: Tania Ade, MD;  Location: [place];  Service: Orthopedics;  Laterality: Right;   Patient Active Problem List   Diagnosis Date Noted   S/P reverse total shoulder arthroplasty, 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   Chronic rhinitis 05/13/2021   Chronic pain disorder 01/24/2021   Pulmonary nodules 11/24/2020   Chronic cough 07/23/2020   Atherosclerosis of aorta ([place]) 07/23/2020   Temporal arteritis ([place]) 09/01/2019   RA (rheumatoid arthritis) ([place]) 09/01/2019   Peripheral neuropathy 07/21/2019   Vitamin D deficiency, unspecified 01/01/2019    Constipation 09/02/2018   Epistaxis, recurrent 12/25/2017   S/P reverse total shoulder arthroplasty, 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   GERD (gastroesophageal reflux disease) 12/26/2016   Generalized anxiety disorder 08/28/2016   Hypothyroidism 07/27/2016   Major depressive disorder, recurrent episode, moderate ([place]) 07/27/2016   Hyperlipidemia 07/27/2016   Insomnia 07/27/2016    REFERRING DIAG: Dr. Earnie Larsson  THERAPY DIAG:  Other low back pain  Muscle weakness (generalized)  Difficulty in walking, not elsewhere classified  PERTINENT HISTORY: Left and total shoulder; RA; right knee issue;  recent bone density "normal for my age"  PRECAUTIONS: Other: Rheumatoid arthritis   SUBJECTIVE: Did ok after evaluation. Doing exercises everyother day. I worked in garden yesterday so my back muscles are little sore.   PAIN:  Are you having pain? No  OBJECTIVE:    DIAGNOSTIC FINDINGS:  MRI facet arthropathy "he was thinking of surgery but he preferred not to do therapy"   PATIENT SURVEYS:  FOTO 45%       COGNITION:           Overall cognitive status: Within functional limits for tasks assessed                          SENSATION: WFL   MUSCLE LENGTH: Hamstrings: Right 90 deg; Left 90 deg POSTURE:  WFLs     LUMBAR ROM:  Flexion directional preference   Active  A/PROM  01/10/22  Flexion WFLs  Extension 5  Right lateral flexion 20  Left lateral flexion 20  Right rotation    Left rotation     (Blank rows = not tested)   LE ROM:  WLFs bil   LE MMT:   MMT Right 01/10/2022 Left 01/10/2022  Hip flexion 4+ 4+  Hip extension      Hip abduction 4 4+  Hip adduction      Hip internal rotation      Hip external rotation      Knee flexion 4 4  Knee extension      Ankle dorsiflexion      Ankle plantarflexion      Ankle inversion      Ankle eversion       (Blank rows = not tested)   LUMBAR SPECIAL TESTS:  Straight leg raise test: Negative   FUNCTIONAL TESTS:  Difficulty rising  from standard chair without UE assist   GAIT: Distance walked: 50 Assistive device utilized: None Level of assistance: Modified independence Comments: no device but reaches for objects to touch for balance       TODAY'S TREATMENT   01/25/22: NuStep x 5 min level 3 Spent approx 3 min 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  Patient states her MD has said she needs knee replacement.  See education section for comments.   Supine hamstring stretch  Nustep L1 x 4 min to end session.  Seated Cat/cow: re-instruction required, pt has difficulty with rhythm.   01/16/22: MHP to low back for exercises Review of initial HEP: SKC Bil 3x 20 sec each pt with re-instruction; DKC 2x 20 sec, Supine bridge 10x 3 sec hold, LT LTR 2x 20 sec, PPT 10x2 pt had trouble keeping her knees apart. Initially PTA guided manually then pt could do independently.  Nustep L1 x 4 min to end session.  Seated Cat/cow: re-instruction required, pt has difficulty with rhythm.    Discussion of facet "gapping" strategies for pain relief using spine model     PATIENT EDUCATION:  Education details: Educated patient on kinetic chain and how her knee pain and how she compensates could easily lead to her issues with her back.  Pointed out the deformity that has started in her right knee and how this will eventually worsen, especially if she is osteopenic.  She will be starting Prolia which should help with bone health.  She was encouraged to speak with her primary MD regarding this concern and to discuss whether she is still a candidate for TKA.   Person educated: Patient Education method: Explanation, Demonstration Education comprehension: verbalized understanding     HOME EXERCISE PROGRAM:  Access Code: [email or web address] URL: [email or web address] Date: 01/25/2022 Prepared by: Candyce Churn  Exercises - Seated Cat Cow  - 1 x daily - 7 x weekly - 1 sets - 10 reps - Supine Double Knee to Chest  - 1 x daily -  7 x weekly - 1 sets - 10 reps - Supine Posterior Pelvic Tilt  - 1 x daily - 7 x weekly - 1 sets - 10 reps - Supine Bridge  - 1 x daily - 7 x weekly - 1 sets - 10 reps - Supine Hamstring Stretch with Strap  - 1 x daily - 7 x weekly - 1 sets - 3 reps - 30 hold - Supine ITB Stretch with Strap  - 1 x daily - 7 x weekly -  1 sets - 3 reps - 30 hold - Supine Figure 4 Piriformis Stretch  - 1 x daily - 7 x weekly - 3 sets - 10 reps - Supine Posterior Pelvic Tilt  - 1 x daily - 7 x weekly - 3 sets - 10 reps - Supine Dead Bug with Leg Extension  - 1 x daily - 7 x weekly - 3 sets - 10 reps ASSESSMENT:   CLINICAL IMPRESSION: Pt states she is "about the same".  She is able to tolerate addition of hamstring, ITB and piriformis stretch.  We did these bilaterally and she is actually fairly flexible.  She was able to tolerated addition of core strengthening as well without difficulty.  She has trendelenburg gait and would benefit from hip strengthening and stability training along with her current POC.     OBJECTIVE IMPAIRMENTS decreased activity tolerance, decreased mobility, difficulty walking, decreased strength, and pain.    ACTIVITY LIMITATIONS cleaning, community activity, meal prep, laundry, and shopping.    PERSONAL FACTORS Past/current experiences, Time since onset of injury/illness/exacerbation, and 1 comorbidity: RA  are also affecting patient's functional outcome.      REHAB POTENTIAL: Good   CLINICAL DECISION MAKING: Stable/uncomplicated   EVALUATION COMPLEXITY: Low     GOALS: Goals reviewed with patient? Yes   SHORT TERM GOALS: Target date: 02/07/2022   The patient will demonstrate knowledge of basic self care strategies and exercises to promote healing   Baseline: Goal status: INITIAL   2.  The patient will report a 40% improvement in pain levels with functional activities which are currently difficult including standing to cook Baseline:  Goal status: INITIAL     LONG TERM GOALS:  Target date: 03/07/2022   The patient will be independent in a safe self progression of a home exercise program to promote further recovery of function   Baseline:  Goal status: INITIAL   2.  The patient will report a 65% improvement in pain levels with functional activities which are currently difficult including standing to cook and other household chores Baseline:  Goal status: INITIAL   3.  Improved lumbo/pelvic/hip strength to grossly 4+/5 needed for walking longer periods of time Baseline:  Goal status: INITIAL   4.  FOTO score improved from 45% to 56% indicating improved function with less pain Baseline:  Goal status: INITIAL    PHYSICAL THERAPY DISCHARGE SUMMARY  Visits from Start of Care: 3  Current functional level related to goals / functional outcomes: See above: patient did not return after last visit   Remaining deficits: See above: patient did not return after last visit   Education / Equipment: See above: patient did not return after last visit   Patient agrees to discharge. Patient goals were not met. Patient is being discharged due to not returning since the last visit.    PLAN: PT FREQUENCY: 1x/week   PT DURATION: 8 weeks   PLANNED INTERVENTIONS: Therapeutic exercises, Therapeutic activity, Neuromuscular re-education, Balance training, Gait training, Patient/Family education, Joint mobilization, Aquatic Therapy, Dry Needling, Electrical stimulation, Spinal mobilization, Cryotherapy, Moist heat, Taping, Traction, Ultrasound, Ionotophoresis 66m/ml Dexamethasone, and Manual therapy.   PLAN FOR NEXT SESSION: neutral and flexion biased exercise; Nu-Step patient education; pain relieving, hip strength consider supine clamshell for additional HEP on next visit.    JAnderson MaltaB. Maurica Omura, PT 03/08/22 1:01 PM   JAnderson MaltaB. Marjan Rosman, PT 01/25/22 3:27 PM

## 2022-01-27 ENCOUNTER — Ambulatory Visit (INDEPENDENT_AMBULATORY_CARE_PROVIDER_SITE_OTHER): Payer: Medicare Other

## 2022-01-27 DIAGNOSIS — M816 Localized osteoporosis [Lequesne]: Secondary | ICD-10-CM | POA: Diagnosis not present

## 2022-01-27 MED ORDER — DENOSUMAB 60 MG/ML ~~LOC~~ SOSY
60.0000 mg | PREFILLED_SYRINGE | Freq: Once | SUBCUTANEOUS | Status: AC
  Administered 2022-01-27: 60 mg via SUBCUTANEOUS

## 2022-01-27 NOTE — Progress Notes (Signed)
Patient presented for 75-monthProlia injection SQ to left arm. Patient tolerated well. Per Dr JMartinique?

## 2022-02-01 DIAGNOSIS — M0579 Rheumatoid arthritis with rheumatoid factor of multiple sites without organ or systems involvement: Secondary | ICD-10-CM | POA: Diagnosis not present

## 2022-02-08 ENCOUNTER — Other Ambulatory Visit: Payer: Self-pay

## 2022-02-08 MED ORDER — PANTOPRAZOLE SODIUM 40 MG PO TBEC: 40.0000 mg | DELAYED_RELEASE_TABLET | Freq: Every day | ORAL | 0 refills | Status: DC

## 2022-02-09 ENCOUNTER — Ambulatory Visit: Payer: Medicare Other | Admitting: Emergency Medicine

## 2022-02-22 DIAGNOSIS — M4310 Spondylolisthesis, site unspecified: Secondary | ICD-10-CM | POA: Diagnosis not present

## 2022-02-22 NOTE — Telephone Encounter (Signed)
Can you please work on Tier exemption.  ?

## 2022-02-23 ENCOUNTER — Telehealth: Payer: Self-pay | Admitting: Physician Assistant

## 2022-02-23 NOTE — Telephone Encounter (Signed)
Inbound call from patient wanting to follow up on Mychart message sent on 5/12 about [employer]. Please advise.

## 2022-02-24 ENCOUNTER — Other Ambulatory Visit (HOSPITAL_COMMUNITY): Payer: Self-pay

## 2022-02-24 NOTE — Telephone Encounter (Addendum)
Tier Exception PA has been approved. Test billing returns a copay of $291.67 this does include an unmet deductible. Future copay once deductible has been met will be $45     In further review, the patient has an unmet deductible of ~401, which has resulted in a higher copay. Will continue to follow to see if tier exception is granted, and if copay although remaining deductible, will be less.   CASE ID [id number] has been assigned to this request with [employer] Part D Tier Cost Sharing Exception Request. Please allow 48hrs for response

## 2022-02-24 NOTE — Telephone Encounter (Signed)
See mychart message from 02/17/22 for further correspondence.

## 2022-02-27 ENCOUNTER — Other Ambulatory Visit (HOSPITAL_COMMUNITY): Payer: Self-pay

## 2022-02-28 NOTE — Telephone Encounter (Signed)
Additional information has been faxed off to the pt plan. Will continue to follow

## 2022-03-01 ENCOUNTER — Other Ambulatory Visit (HOSPITAL_COMMUNITY): Payer: Self-pay

## 2022-03-01 DIAGNOSIS — M0579 Rheumatoid arthritis with rheumatoid factor of multiple sites without organ or systems involvement: Secondary | ICD-10-CM | POA: Diagnosis not present

## 2022-03-07 ENCOUNTER — Ambulatory Visit: Payer: Medicare Other | Admitting: Physician Assistant

## 2022-03-08 ENCOUNTER — Encounter: Payer: Self-pay | Admitting: Physician Assistant

## 2022-03-08 ENCOUNTER — Ambulatory Visit (INDEPENDENT_AMBULATORY_CARE_PROVIDER_SITE_OTHER): Payer: Medicare Other | Admitting: Physician Assistant

## 2022-03-08 VITALS — BP 118/66 | HR 64 | Ht 63.0 in | Wt 131.0 lb

## 2022-03-08 DIAGNOSIS — Z86010 Personal history of colonic polyps: Secondary | ICD-10-CM

## 2022-03-08 DIAGNOSIS — Z860101 Personal history of adenomatous and serrated colon polyps: Secondary | ICD-10-CM

## 2022-03-08 DIAGNOSIS — K5909 Other constipation: Secondary | ICD-10-CM

## 2022-03-08 MED ORDER — LUBIPROSTONE 24 MCG PO CAPS: 24.0000 ug | ORAL_CAPSULE | Freq: Two times a day (BID) | ORAL | 3 refills | Status: DC

## 2022-03-08 NOTE — Progress Notes (Unsigned)
Chief Complaint: Follow-up constipation  HPI:    Jennifer Hull is a 81 year old female with a past medical history as listed below including chronic constipation, known to Dr. Rush Landmark, who returns to clinic today for follow-up of her chronic constipation.     09/02/2019 EGD and colonoscopy. Colonoscopy with 8 polyps in the rectum, erythematous mucosa in the mid rectum and distal rectum and nonbleeding nonthrombosed boast external and internal hemorrhoids. EGD with small hiatal hernia, gastritis and no gross lesions in the duodenum. Pathology was significant for gastritis and a mixture of tubular adenomas and sessile serrated polyp.  Repeat colonoscopy recommended in 3 years.    08/09/2021 patient seen in clinic and at that time discussed that Trulance worked very well for her but it was costing her a lot of money.  That time was because she had previously tried Linzess as well as Amitiza which did not work well for her.  That time she was given some more samples and we are trying to get it approved for her.    Today, the patient tells me that unfortunately she has not been able to get the Trulance paid for by her insurance.  Due to the high cost of this she is continued on Linzess 290 mcg daily but is also taking additional magnesium supplements, MiraLAX and suppositories and stool softeners.  She tells me it is still really not working for her and she feels full up with a lot of excess gas which she is using Simethicone as needed.  Tells me she is generally just not feeling well and feels like it is due to the buildup of stool in her system.  She is only getting out little bits at a time.    Does express some recent dizziness over the past couple of days.    Denies fever, chills or weight loss.  Past Medical History:  Diagnosis Date   Anxiety    Arthritis    Chronic constipation    Depression    Hyperlipidemia    Hypothyroidism    Pneumonia    Rheumatoid arthritis ([place]) 2019   Temporal  arteritis ([place])    Thyroid disease     Past Surgical History:  Procedure Laterality Date   ABDOMINAL HYSTERECTOMY     BREAST EXCISIONAL BIOPSY Left    BREAST SURGERY     biopsy   COLONOSCOPY     REVERSE SHOULDER ARTHROPLASTY Left 06/09/2021   Procedure: REVERSE SHOULDER ARTHROPLASTY;  Surgeon: Tania Ade, MD;  Location: [facility];  Service: Orthopedics;  Laterality: Left;   SHOULDER SURGERY Left 06/09/2021   TEMPORAL ARTERY BIOPSY / LIGATION Bilateral 2014   TONSILLECTOMY     TOTAL SHOULDER ARTHROPLASTY Right 11/22/2017   Procedure: RIGHT REVERSE TOTAL SHOULDER ARTHROPLASTY;  Surgeon: Tania Ade, MD;  Location: [facility];  Service: Orthopedics;  Laterality: Right;    Current Outpatient Medications  Medication Sig Dispense Refill   acetaminophen (TYLENOL) 500 MG tablet Take 1,000 mg by mouth every 8 (eight) hours as needed for moderate pain.     atorvastatin (LIPITOR) 20 MG tablet TAKE 1 TABLET BY MOUTH EVERY DAY 90 tablet 3   bisacodyl (DULCOLAX) 10 MG suppository Every 2 days as needed. 30 suppository 1   Calcium Carb-Cholecalciferol (CALCIUM 600+D3 PO) Take 1 tablet by mouth daily with lunch.     cholecalciferol (VITAMIN D3) 25 MCG (1000 UNIT) tablet Take 1,000 Units by mouth daily.     fluticasone (FLONASE) 50 MCG/ACT nasal spray Place 2 sprays  into both nostrils at bedtime.     folic acid (FOLVITE) 382 MCG tablet Take 800 mcg by mouth daily.     gabapentin (NEURONTIN) 300 MG capsule TAKE 1 CAPSULE BY MOUTH 3 TIMES DAILY 90 capsule 3   levothyroxine (EUTHYROX) 112 MCG tablet Take 1 tablet (112 mcg total) by mouth daily before breakfast. 90 tablet 2   linaclotide (LINZESS) 290 MCG CAPS capsule Take 290 mcg by mouth daily before breakfast.     loratadine (CLARITIN) 10 MG tablet Take 1 tablet (10 mg total) by mouth daily. 30 tablet 11   Magnesium Hydroxide (DULCOLAX PO) Take by mouth.     magnesium oxide (MAG-OX) 400 MG tablet Take 400 mg by mouth 2 (two) times daily.      Melatonin 10 MG CAPS Take 10 mg by mouth at bedtime.     Omega-3 Fatty Acids (FISH OIL) 1000 MG CAPS Take 1,000 mg by mouth daily.     pantoprazole (PROTONIX) 40 MG tablet Take 1 tablet (40 mg total) by mouth daily. 90 tablet 0   Polyethylene Glycol 3350 (MIRALAX PO) Take by mouth.     sertraline (ZOLOFT) 100 MG tablet TAKE 1 AND 1/2 TABLETS BY MOUTH ONCE DAILY 135 tablet 2   temazepam (RESTORIL) 15 MG capsule TAKE 1 CAPSULE BY MOUTH AT BEDTIME AS NEEDED SLEEP 30 capsule 3   Tocilizumab (ACTEMRA IV) Inject into the vein every 30 (thirty) days.     TURMERIC CURCUMIN PO Take 3,000 mg by mouth daily.     zinc gluconate 50 MG tablet Take 50 mg by mouth daily.     No current facility-administered medications for this visit.    Allergies as of 03/08/2022 - Review Complete 03/08/2022  Allergen Reaction Noted   Tdap [tetanus-diphth-acell pertussis] Other (See Comments) 07/27/2016   Tetanus toxoid, adsorbed  12/07/2017   Hydroxychloroquine sulfate Rash 06/02/2021   Covid-19 (mrna) vaccine (pfizer) [covid-19 (mrna) vaccine] Rash 05/13/2021    Family History  Problem Relation Age of Onset   Kidney cancer Mother    Heart disease Father    Breast cancer Maternal Aunt    Colon cancer Neg Hx    Esophageal cancer Neg Hx    Rectal cancer Neg Hx    Stomach cancer Neg Hx     Social History   Socioeconomic History   Marital status: Married    Spouse name: Not on file   Number of children: Not on file   Years of education: Not on file   Highest education level: Not on file  Occupational History   Not on file  Tobacco Use   Smoking status: Former    Packs/day: 1.50    Years: 8.00    Pack years: 12.00    Types: Cigarettes    Quit date: 66    Years since quitting: 45.4   Smokeless tobacco: Never   Tobacco comments:    smoked infrequently when she was young; quit 81 yo   Vaping Use   Vaping Use: Never used  Substance and Sexual Activity   Alcohol use: No   Drug use: No   Sexual  activity: Yes    Partners: Male    Birth control/protection: Surgical  Other Topics Concern   Not on file  Social History Narrative   Not on file   Social Determinants of Health   Financial Resource Strain: Not on file  Food Insecurity: Not on file  Transportation Needs: Not on file  Physical Activity: Not on file  Stress: Not on file  Social Connections: Not on file  Intimate Partner Violence: Not on file    Review of Systems:    Constitutional: No weight loss, fever, chills, weakness or fatigue HEENT: Eyes: No change in vision               Ears, Nose, Throat:  No change in hearing or congestion Skin: No rash or itching Cardiovascular: No chest pain, chest pressure or palpitations   Respiratory: No SOB or cough Gastrointestinal: See HPI and otherwise negative Genitourinary: No dysuria or change in urinary frequency Neurological: No headache, dizziness or syncope Musculoskeletal: No new muscle or joint pain Hematologic: No bleeding or bruising Psychiatric: No history of depression or anxiety    Physical Exam:  Vital signs: Ht '5\' 3"'$  (1.6 m)   Wt 131 lb (59.4 kg)   BMI 23.21 kg/m   Constitutional:   Pleasant Caucasian female appears to be in NAD, Well developed, Well nourished, alert and cooperative Head:  Normocephalic and atraumatic. Eyes:   PEERL, EOMI. No icterus. Conjunctiva pink. Ears:  Normal auditory acuity. Neck:  Supple Throat: Oral cavity and pharynx without inflammation, swelling or lesion.  Respiratory: Respirations even and unlabored. Lungs clear to auscultation bilaterally.   No wheezes, crackles, or rhonchi.  Cardiovascular: Normal S1, S2. No MRG. Regular rate and rhythm. No peripheral edema, cyanosis or pallor.  Gastrointestinal:  Soft, nondistended, nontender. No rebound or guarding. Normal bowel sounds. No appreciable masses or hepatomegaly. Rectal:  Not performed.  Msk:  Symmetrical without gross deformities. Without edema, no deformity or joint  abnormality.  Neurologic:  Alert and  oriented x4;  grossly normal neurologically.  Skin:   Dry and intact without significant lesions or rashes. Psychiatric: Oriented to person, place and time. Demonstrates good judgement and reason without abnormal affect or behaviors.  RELEVANT LABS AND IMAGING: CBC    Component Value Date/Time   WBC 9.0 06/10/2021 0314   RBC 3.52 (L) 06/10/2021 0314   HGB 11.1 (L) 06/10/2021 0314   HCT 32.0 (L) 06/10/2021 0314   PLT 163 06/10/2021 0314   MCV 90.9 06/10/2021 0314   MCH 31.5 06/10/2021 0314   MCHC 34.7 06/10/2021 0314   RDW 12.9 06/10/2021 0314   LYMPHSABS 1.7 06/02/2021 0947   MONOABS 0.6 06/02/2021 0947   EOSABS 0.1 06/02/2021 0947   BASOSABS 0.1 06/02/2021 0947    CMP     Component Value Date/Time   NA 138 06/10/2021 0314   NA 144 03/23/2021 0000   K 3.6 06/10/2021 0314   CL 108 06/10/2021 0314   CO2 26 06/10/2021 0314   GLUCOSE 90 06/10/2021 0314   BUN 17 06/10/2021 0314   BUN 16 03/23/2021 0000   CREATININE 0.70 06/10/2021 0314   CALCIUM 8.6 (L) 06/10/2021 0314   PROT 6.8 06/02/2021 0947   ALBUMIN 4.7 06/02/2021 0947   AST 19 06/02/2021 0947   ALT 17 06/02/2021 0947   ALKPHOS 55 06/02/2021 0947   BILITOT 0.8 06/02/2021 0947   GFRNONAA >60 06/10/2021 0314   GFRAA >60 11/23/2017 0710    Assessment: 1. ***  Plan: 1. ***     Jennifer Newer, PA-C [facility] 03/08/2022, 11:00 AM  Cc: Martinique, Betty G, MD

## 2022-03-08 NOTE — Patient Instructions (Signed)
We have sent the following medications to your pharmacy for you to pick up at your convenience: Amitiza 24 mcg twice daily.   Complete Plenvu bowel purge.   If you are age 80 or older, your body mass index should be between 23-30. Your Body mass index is 23.21 kg/m. If this is out of the aforementioned range listed, please consider follow up with your Primary Care Provider.  If you are age 3 or younger, your body mass index should be between 19-25. Your Body mass index is 23.21 kg/m. If this is out of the aformentioned range listed, please consider follow up with your Primary Care Provider.   ________________________________________________________  The [facility] providers would like to encourage you to use [facility] to communicate with providers for non-urgent requests or questions.  Due to long hold times on the telephone, sending your provider a message by [facility] may be a faster and more efficient way to get a response.  Please allow 48 business hours for a response.  Please remember that this is for non-urgent requests.  _______________________________________________________

## 2022-03-08 NOTE — Telephone Encounter (Signed)
Is there an update on this Tier reduction?

## 2022-03-09 ENCOUNTER — Encounter: Payer: Self-pay | Admitting: Physician Assistant

## 2022-03-09 NOTE — Progress Notes (Signed)
Attending Physician's Attestation   I have reviewed the chart.   I agree with the Advanced Practitioner's note, impression, and recommendations with any updates as below.    Sharyl Panchal Mansouraty, MD [facility] Advanced [facility]oscopy Office # [phone]  

## 2022-03-10 ENCOUNTER — Encounter: Payer: Self-pay | Admitting: Family Medicine

## 2022-03-10 NOTE — Progress Notes (Signed)
ACUTE VISIT Chief Complaint  Patient presents with   Dizziness    Was okay over the weekend, has had it about 3 times since waking up today.    HPI: [name] is a 81 y.o. female with hx of anxiety,depression, RA, PMR, generalized OA, constipation,peripheral neuropathy, HLD,and hypothyroidism here today complaining of intermittent episodes of dizziness. She cannot described symptom, it is not spinning. Episodes last a few seconds, exacerbated by movement.  Dizziness This is a new problem. The current episode started in the past 7 days. The problem has been gradually improving. Associated symptoms include arthralgias (Chronic), fatigue, neck pain and numbness (No more than usual). Pertinent negatives include no abdominal pain, anorexia, change in bowel habit, chest pain, chills, congestion, coughing, diaphoresis, fever, headaches, joint swelling, nausea, rash, sore throat, urinary symptoms, vertigo, visual change, vomiting or weakness. She has tried nothing for the symptoms.  She acknowledges she has not been drinking enough water. No new medications. She is now on trulance for constipation,but she has taken it before. Last bowel movement today. Negative for dysuria,increased urinary frequency, gross hematuria,or decreased urine output.  Last labs done on 01/19/22.  Insomnia has improved with Melatonin 10 mg daily. Peripheral neuropathy: No changes in numbness of extremities. She is taking Gabapentin once daily, decreased dose from 300 mg tid to just bedtime.  Review of Systems  Constitutional:  Positive for fatigue. Negative for chills, diaphoresis and fever.  HENT:  Negative for congestion and sore throat.   Respiratory:  Negative for cough, shortness of breath and wheezing.   Cardiovascular:  Negative for chest pain and palpitations.  Gastrointestinal:  Negative for abdominal pain, anorexia, change in bowel habit, nausea and vomiting.  Musculoskeletal:  Positive for  arthralgias (Chronic) and neck pain. Negative for joint swelling.  Skin:  Negative for rash.  Neurological:  Positive for dizziness and numbness (No more than usual). Negative for vertigo, weakness and headaches.  Rest see pertinent positives and negatives per HPI.  Current Outpatient Medications on File Prior to Visit  Medication Sig Dispense Refill   acetaminophen (TYLENOL) 500 MG tablet Take 1,000 mg by mouth every 8 (eight) hours as needed for moderate pain.     atorvastatin (LIPITOR) 20 MG tablet TAKE 1 TABLET BY MOUTH EVERY DAY 90 tablet 3   bisacodyl (DULCOLAX) 10 MG suppository Every 2 days as needed. 30 suppository 1   Calcium Carb-Cholecalciferol (CALCIUM 600+D3 PO) Take 1 tablet by mouth daily with lunch.     cholecalciferol (VITAMIN D3) 25 MCG (1000 UNIT) tablet Take 1,000 Units by mouth daily.     fluticasone (FLONASE) 50 MCG/ACT nasal spray Place 2 sprays into both nostrils at bedtime.     folic acid (FOLVITE) 638 MCG tablet Take 800 mcg by mouth daily.     levothyroxine (EUTHYROX) 112 MCG tablet Take 1 tablet (112 mcg total) by mouth daily before breakfast. 90 tablet 2   loratadine (CLARITIN) 10 MG tablet Take 1 tablet (10 mg total) by mouth daily. 30 tablet 11   magnesium oxide (MAG-OX) 400 MG tablet Take 400 mg by mouth 2 (two) times daily.     Melatonin 10 MG CAPS Take 10 mg by mouth at bedtime.     Omega-3 Fatty Acids (FISH OIL) 1000 MG CAPS Take 1,000 mg by mouth daily.     pantoprazole (PROTONIX) 40 MG tablet Take 1 tablet (40 mg total) by mouth daily. 90 tablet 0   sertraline (ZOLOFT) 100 MG tablet TAKE 1  AND 1/2 TABLETS BY MOUTH ONCE DAILY 135 tablet 2   temazepam (RESTORIL) 15 MG capsule TAKE 1 CAPSULE BY MOUTH AT BEDTIME AS NEEDED SLEEP 30 capsule 3   Tocilizumab (ACTEMRA IV) Inject into the vein every 30 (thirty) days.     TURMERIC CURCUMIN PO Take 3,000 mg by mouth daily.     zinc gluconate 50 MG tablet Take 50 mg by mouth daily.     No current  facility-administered medications on file prior to visit.   Past Medical History:  Diagnosis Date   Anxiety    Arthritis    Chronic constipation    Depression    Hyperlipidemia    Hypothyroidism    Pneumonia    Rheumatoid arthritis ([place]) 2019   Temporal arteritis ([facility])    Thyroid disease    Allergies  Allergen Reactions   Tdap [Tetanus-Diphth-Acell Pertussis] Other (See Comments)    SEVERE ALLERGIC REACTIONS (NECK STIFFNESS/HIGH FEVER/ETC)   Tetanus Toxoid, Adsorbed     Neck stiffness, and high fever Other reaction(s): Other (See Comments) Neck stiffness, and high fever   Hydroxychloroquine Sulfate Rash   Covid-19 (Mrna) Vaccine Therapist, music) [Covid-19 (Mrna) Vaccine] Rash   Social History   Socioeconomic History   Marital status: Married    Spouse name: Not on file   Number of children: Not on file   Years of education: Not on file   Highest education level: Not on file  Occupational History   Not on file  Tobacco Use   Smoking status: Former    Packs/day: 1.50    Years: 8.00    Pack years: 12.00    Types: Cigarettes    Quit date: 62    Years since quitting: 45.4   Smokeless tobacco: Never   Tobacco comments:    smoked infrequently when she was young; quit 81 yo   Vaping Use   Vaping Use: Never used  Substance and Sexual Activity   Alcohol use: No   Drug use: No   Sexual activity: Yes    Partners: Male    Birth control/protection: Surgical  Other Topics Concern   Not on file  Social History Narrative   Not on file   Social Determinants of Health   Financial Resource Strain: Not on file  Food Insecurity: Not on file  Transportation Needs: Not on file  Physical Activity: Not on file  Stress: Not on file  Social Connections: Not on file   Vitals:   03/13/22 0922  BP: 120/70  Pulse: 70  Resp: 16  SpO2: 97%   Body mass index is 23.21 kg/m.  Physical Exam Vitals and nursing note reviewed.  Constitutional:      General: She is not in acute  distress.    Appearance: She is well-developed.  HENT:     Head: Normocephalic and atraumatic.     Right Ear: Tympanic membrane, ear canal and external ear normal.     Left Ear: Tympanic membrane, ear canal and external ear normal.     Mouth/Throat:     Mouth: Mucous membranes are moist.     Pharynx: Oropharynx is clear.  Eyes:     Conjunctiva/sclera: Conjunctivae normal.  Neck:     Vascular: No carotid bruit.  Cardiovascular:     Rate and Rhythm: Normal rate and regular rhythm.     Heart sounds: No murmur heard. Pulmonary:     Effort: Pulmonary effort is normal. No respiratory distress.     Breath sounds: Normal breath sounds.  Abdominal:     Palpations: Abdomen is soft. There is no mass.     Tenderness: There is no abdominal tenderness.  Musculoskeletal:     Right lower leg: No edema.     Left lower leg: No edema.  Lymphadenopathy:     Cervical: No cervical adenopathy.  Skin:    General: Skin is warm.     Findings: No erythema or rash.  Neurological:     General: No focal deficit present.     Mental Status: She is alert and oriented to person, place, and time.     Cranial Nerves: No cranial nerve deficit.     Comments: Symmetric DTR's (patellar and biceps). Mildly unstable gait, not assisted.  Psychiatric:        Mood and Affect: Mood is anxious.     Comments: Well groomed, good eye contact.   ASSESSMENT AND PLAN:  Ms.Aberdeen was seen today for dizziness.  Diagnoses and all orders for this visit:  Dizziness Problem is improving. Hx and examination today do not suggest a serious process. We discussed possible etiologies, some of her chronic medical problems as well as medications cam be contributing factors. Stressed the importance of adequate hydration. She has labs done at her rheumatologist's office, has an appt coming, so she prefers to hold on blood work today. Fall precautions discussed. Instructed about warning signs.  Insomnia, unspecified  type Improved. Continue Melatonin 10 mg before bedtime and Temazepam 15 mg at bedtime as needed.  Peripheral polyneuropathy Stable. Continue Gabapentin 300 mg at bedtime. Appropriate foot/skin care.  -     gabapentin (NEURONTIN) 300 MG capsule; Take 1 capsule (300 mg total) by mouth at bedtime.  I spent a total of 32 minutes in both face to face and non face to face activities for this visit on the date of this encounter. During this time history was obtained and documented, examination was performed, prior labs reviewed, and assessment/plan discussed.  Return if symptoms worsen or fail to improve, for Keep next appt.  Bethan Adamek G. Martinique, MD  [facility]. [place] office.

## 2022-03-13 ENCOUNTER — Encounter: Payer: Self-pay | Admitting: Family Medicine

## 2022-03-13 ENCOUNTER — Ambulatory Visit (INDEPENDENT_AMBULATORY_CARE_PROVIDER_SITE_OTHER): Payer: Medicare Other | Admitting: Family Medicine

## 2022-03-13 ENCOUNTER — Telehealth: Payer: Self-pay | Admitting: Family Medicine

## 2022-03-13 VITALS — BP 120/70 | HR 70 | Resp 16 | Ht 63.0 in | Wt 131.0 lb

## 2022-03-13 DIAGNOSIS — G629 Polyneuropathy, unspecified: Secondary | ICD-10-CM

## 2022-03-13 DIAGNOSIS — G4700 Insomnia, unspecified: Secondary | ICD-10-CM

## 2022-03-13 DIAGNOSIS — R42 Dizziness and giddiness: Secondary | ICD-10-CM

## 2022-03-13 MED ORDER — GABAPENTIN 300 MG PO CAPS: 300.0000 mg | ORAL_CAPSULE | Freq: Every day | ORAL | 3 refills | Status: DC

## 2022-03-13 NOTE — Patient Instructions (Addendum)
A few things to remember from today's visit:  Dizziness  Insomnia, unspecified type  Peripheral polyneuropathy  If you need refills please call your pharmacy. Do not use My Chart to request refills or for acute issues that need immediate attention.   Please be sure medication list is accurate. If a new problem present, please set up appointment sooner than planned today. Dizziness Dizziness is a common problem. It makes you feel unsteady or light-headed. You may feel like you are about to pass out (faint). Dizziness can lead to getting hurt if you stumble or fall. Dizziness can be caused by many things, including: Medicines. Not having enough water in your body (dehydration). Illness. Follow these instructions at home: Eating and drinking  Drink enough fluid to keep your pee (urine) pale yellow. This helps to keep you from getting dehydrated. Try to drink more clear fluids, such as water. Do not drink alcohol. Limit how much caffeine you drink or eat, if your doctor tells you to do that. Limit how much salt (sodium) you drink or eat, if your doctor tells you to do that. Activity pibName

## 2022-03-13 NOTE — Telephone Encounter (Signed)
Chart updated

## 2022-03-13 NOTE — Telephone Encounter (Signed)
Patient would like [facility] removed from list as she only uses the [facility]. Wants no prescriptions sent to [facility], only [facility].       FYI

## 2022-03-29 ENCOUNTER — Telehealth: Payer: Self-pay | Admitting: Family Medicine

## 2022-03-29 NOTE — Telephone Encounter (Signed)
Left message for patient to call back and schedule Medicare Annual Wellness Visit (AWV) either virtually or in office. Left  my jabber number [phone]   Last AWV ;03/30/21  please schedule at anytime with [facility] Nurse Health Advisor 1 or 2   

## 2022-04-03 DIAGNOSIS — R5383 Other fatigue: Secondary | ICD-10-CM | POA: Diagnosis not present

## 2022-04-03 DIAGNOSIS — Z79899 Other long term (current) drug therapy: Secondary | ICD-10-CM | POA: Diagnosis not present

## 2022-04-03 DIAGNOSIS — Z111 Encounter for screening for respiratory tuberculosis: Secondary | ICD-10-CM | POA: Diagnosis not present

## 2022-04-03 DIAGNOSIS — M0579 Rheumatoid arthritis with rheumatoid factor of multiple sites without organ or systems involvement: Secondary | ICD-10-CM | POA: Diagnosis not present

## 2022-04-04 ENCOUNTER — Ambulatory Visit (INDEPENDENT_AMBULATORY_CARE_PROVIDER_SITE_OTHER): Payer: Medicare Other

## 2022-04-04 VITALS — BP 110/60 | HR 68 | Temp 98.0°F | Ht 61.5 in | Wt 133.8 lb

## 2022-04-04 DIAGNOSIS — Z0000 Encounter for general adult medical examination without abnormal findings: Secondary | ICD-10-CM | POA: Diagnosis not present

## 2022-04-04 NOTE — Progress Notes (Signed)
Subjective:   Jennifer Hull is a 81 y.o. female who presents for Medicare Annual (Subsequent) preventive examination.  Review of Systems     Cardiac Risk Factors include: advanced age (>58mn, >>40women);dyslipidemia     Objective:    Today's Vitals   04/04/22 1503  BP: 110/60  Pulse: 68  Temp: 98 F (36.7 C)  TempSrc: Oral  SpO2: 96%  Weight: 133 lb 12.8 oz (60.7 kg)  Height: 5' 1.5" (1.562 m)   Body mass index is 24.87 kg/m.     04/04/2022    3:15 PM 01/10/2022    3:36 PM 06/09/2021   12:30 PM 06/02/2021    9:22 AM 01/16/2020   11:37 PM 12/25/2017    3:14 PM 11/15/2017    2:15 PM  Advanced Directives  Does Patient Have a Medical Advance Directive? Yes Yes Yes Yes No Yes Yes  Type of [place]aramed[place]of [place] will Living will;Healthcare Power of [place]   [place]  Does patient want to make changes to medical advance directive?  No - Patient declined No - Patient declined      Copy of HKatherinein Chart? No - copy requested No - copy requested No - copy requested No - copy requested   No - copy requested  Would patient like information on creating a medical advance directive?     No - Patient declined      Current Medications (verified) Outpatient Encounter Medications as of 04/04/2022  Medication Sig   acetaminophen (TYLENOL) 500 MG tablet Take 1,000 mg by mouth every 8 (eight) hours as needed for moderate pain.   atorvastatin (LIPITOR) 20 MG tablet TAKE 1 TABLET BY MOUTH EVERY DAY   Calcium Carb-Cholecalciferol (CALCIUM 600+D3 PO) Take 1 tablet by mouth daily with lunch.   cholecalciferol (VITAMIN D3) 25 MCG (1000 UNIT) tablet Take 1,000 Units by mouth daily.   fluticasone (FLONASE) 50 MCG/ACT nasal spray Place 2 sprays into both nostrils at bedtime.   folic acid (FOLVITE) 8379MCG tablet Take 800 mcg by mouth  daily.   gabapentin (NEURONTIN) 300 MG capsule Take 1 capsule (300 mg total) by mouth at bedtime.   levothyroxine (EUTHYROX) 112 MCG tablet Take 1 tablet (112 mcg total) by mouth daily before breakfast.   loratadine (CLARITIN) 10 MG tablet Take 1 tablet (10 mg total) by mouth daily.   magnesium oxide (MAG-OX) 400 MG tablet Take 400 mg by mouth 2 (two) times daily.   Melatonin 10 MG CAPS Take 10 mg by mouth at bedtime.   Omega-3 Fatty Acids (FISH OIL) 1000 MG CAPS Take 1,000 mg by mouth daily.   pantoprazole (PROTONIX) 40 MG tablet Take 1 tablet (40 mg total) by mouth daily.   sertraline (ZOLOFT) 100 MG tablet TAKE 1 AND 1/2 TABLETS BY MOUTH ONCE DAILY   temazepam (RESTORIL) 15 MG capsule TAKE 1 CAPSULE BY MOUTH AT BEDTIME AS NEEDED SLEEP   Tocilizumab (ACTEMRA IV) Inject into the vein every 30 (thirty) days.   TRULANCE 3 MG TABS Take 1 tablet by mouth daily.   TURMERIC CURCUMIN PO Take 3,000 mg by mouth daily.   bisacodyl (DULCOLAX) 10 MG suppository Every 2 days as needed. (Patient not taking: Reported on 04/04/2022)   zinc gluconate 50 MG tablet Take 50 mg by mouth daily. (Patient not taking: Reported on 04/04/2022)   No facility-administered encounter medications on file  as of 04/04/2022.    Allergies (verified) Tdap [tetanus-diphth-acell pertussis]; Tetanus toxoid, adsorbed; Hydroxychloroquine sulfate; and Covid-19 (mrna) vaccine (pfizer) [covid-19 (mrna) vaccine]   History: Past Medical History:  Diagnosis Date   Anxiety    Arthritis    Chronic constipation    Depression    Hyperlipidemia    Hypothyroidism    Pneumonia    Rheumatoid arthritis ([place]) 2019   Temporal arteritis ([place])    Thyroid disease    Past Surgical History:  Procedure Laterality Date   ABDOMINAL HYSTERECTOMY     BREAST EXCISIONAL BIOPSY Left    BREAST SURGERY     biopsy   COLONOSCOPY     REVERSE SHOULDER ARTHROPLASTY Left 06/09/2021   Procedure: REVERSE SHOULDER ARTHROPLASTY;  Surgeon: Tania Ade,  MD;  Location: [facility];  Service: Orthopedics;  Laterality: Left;   SHOULDER SURGERY Left 06/09/2021   TEMPORAL ARTERY BIOPSY / LIGATION Bilateral 2014   TONSILLECTOMY     TOTAL SHOULDER ARTHROPLASTY Right 11/22/2017   Procedure: RIGHT REVERSE TOTAL SHOULDER ARTHROPLASTY;  Surgeon: Tania Ade, MD;  Location: [facility];  Service: Orthopedics;  Laterality: Right;   Family History  Problem Relation Age of Onset   Kidney cancer Mother    Heart disease Father    Breast cancer Maternal Aunt    Colon cancer Neg Hx    Esophageal cancer Neg Hx    Rectal cancer Neg Hx    Stomach cancer Neg Hx    Social History   Socioeconomic History   Marital status: Married    Spouse name: Not on file   Number of children: Not on file   Years of education: Not on file   Highest education level: Not on file  Occupational History   Not on file  Tobacco Use   Smoking status: Former    Packs/day: 1.50    Years: 8.00    Total pack years: 12.00    Types: Cigarettes    Quit date: 27    Years since quitting: 45.5   Smokeless tobacco: Never   Tobacco comments:    smoked infrequently when she was young; quit 81 yo   Vaping Use   Vaping Use: Never used  Substance and Sexual Activity   Alcohol use: No   Drug use: No   Sexual activity: Not Currently    Partners: Male    Birth control/protection: Surgical  Other Topics Concern   Not on file  Social History Narrative   Not on file   Social Determinants of Health   Financial Resource Strain: Low Risk  (04/04/2022)   Overall Financial Resource Strain (CARDIA)    Difficulty of Paying Living Expenses: Not hard at all  Food Insecurity: No Food Insecurity (04/04/2022)   Hunger Vital Sign    Worried About Running Out of Food in the Last Year: Never true    [place] in the Last Year: Never true  Transportation Needs: No Transportation Needs (04/04/2022)   PRAPARE - Hydrologist (Medical): No    Lack of Transportation  (Non-Medical): No  Physical Activity: Inactive (04/04/2022)   Exercise Vital Sign    Days of Exercise per Week: 0 days    Minutes of Exercise per Session: 0 min  Stress: No Stress Concern Present (04/04/2022)   [place]    Feeling of Stress : Not at all  Social Connections: Not on file    Tobacco Counseling  Counseling given: Not Answered Tobacco comments: smoked infrequently when she was young; quit 81 yo    Clinical Intake:  Pre-visit preparation completed: Yes  Pain : No/denies pain     Nutritional Status: BMI of 19-24  Normal Nutritional Risks: None Diabetes: No  How often do you need to have someone help you when you read instructions, pamphlets, or other written materials from your doctor or pharmacy?: 1 - Never What is the last grade level you completed in school?: nursing  Diabetic?no  Interpreter Needed?: No  Information entered by :: NAllen LPN   Activities of Daily Living    04/04/2022    3:16 PM 06/09/2021   12:30 PM  In your present state of health, do you have any difficulty performing the following activities:  Hearing? 0 0  Vision? 0 0  Difficulty concentrating or making decisions? 1 0  Walking or climbing stairs? 1 0  Dressing or bathing? 0 0  Doing errands, shopping? 1 0  Preparing Food and eating ? N   Using the Toilet? N   In the past six months, have you accidently leaked urine? N   Do you have problems with loss of bowel control? N   Managing your Medications? N   Managing your Finances? N   Housekeeping or managing your Housekeeping? N     Patient Care Team: [place], Betty G, MD as PCP - General (Family Medicine) Audery Amel Sharma Covert, [facility] (Inactive) as Pharmacist (Pharmacist)  Indicate any recent Medical Services you may have received from other than [facility] providers in the past year (date may be approximate).     Assessment:   This is a routine wellness examination for  Vannary.  Hearing/Vision screen Vision Screening - Comments:: Regular eye exams, [facility]  Dietary issues and exercise activities discussed: Current Exercise Habits: The patient does not participate in regular exercise at present   Goals Addressed             This Visit's Progress    Patient Stated       04/04/2022, no goals       Depression Screen    04/04/2022    3:16 PM 03/13/2022    9:58 AM 01/18/2022    4:15 PM 07/19/2021    2:52 PM 03/31/2021    3:37 PM 01/24/2021    3:47 PM 01/30/2020    2:55 PM  PHQ 2/9 Scores  PHQ - 2 Score 0 0 0 0 0 0 2  PHQ- 9 Score  '7 4 3  7 7    '$ Fall Risk    04/04/2022    3:16 PM 03/13/2022    9:57 AM 07/19/2021    9:14 PM 01/25/2021    1:07 PM 01/30/2020    4:56 PM  Fall Risk   Falls in the past year? 0 0 0 0 0  Number falls in past yr: 0 0 0 0 0  Injury with Fall? 0 0 0 0 0  Risk for fall due to : Medication side effect History of fall(s) Orthopedic patient    Follow up Falls evaluation completed;Education provided;Falls prevention discussed Falls evaluation completed Education provided Education provided Education provided    FALL RISK PREVENTION PERTAINING TO THE HOME:  Any stairs in or around the home? Yes  If so, are there any without handrails?  Has chair lift Home free of loose throw rugs in walkways, pet beds, electrical cords, etc? Yes  Adequate lighting in your home to reduce risk of  falls? Yes   ASSISTIVE DEVICES UTILIZED TO PREVENT FALLS:  Life alert? No  Use of a cane, walker or w/c? No  Grab bars in the bathroom? Yes  Shower chair or bench in shower? Yes  Elevated toilet seat or a handicapped toilet? Yes   TIMED UP AND GO:  Was the test performed? No .    Gait slow and steady without use of assistive device  Cognitive Function:        04/04/2022    3:18 PM  6CIT Screen  What Year? 0 points  What month? 0 points  What time? 0 points  Count back from 20 0 points  Months in reverse 0 points   Repeat phrase 8 points  Total Score 8 points    Immunizations Immunization History  Administered Date(s) Administered   Influenza, High Dose Seasonal PF 06/27/2017, 07/02/2018   PFIZER(Purple Top)SARS-COV-2 Vaccination 12/17/2019, 01/09/2020   Pneumococcal Conjugate-13 06/27/2017   Pneumococcal Polysaccharide-23 07/02/2018   TDAP status: allergy  Flu Vaccine status: Declined, Education has been provided regarding the importance of this vaccine but patient still declined. Advised may receive this vaccine at local pharmacy or Health Dept. Aware to provide a copy of the vaccination record if obtained from local pharmacy or Health Dept. Verbalized acceptance and understanding.  Pneumococcal vaccine status: Up to date  Covid-19 vaccine status: Completed vaccines  Qualifies for Shingles Vaccine? Yes   Zostavax completed No   Shingrix Completed?: No.    Education has been provided regarding the importance of this vaccine. Patient has been advised to call insurance company to determine out of pocket expense if they have not yet received this vaccine. Advised may also receive vaccine at local pharmacy or Health Dept. Verbalized acceptance and understanding.  Screening Tests Health Maintenance  Topic Date Due   COVID-19 Vaccine (3 - Pfizer risk series) 04/20/2022 (Originally 02/06/2020)   Zoster Vaccines- Shingrix (1 of 2) 07/05/2022 (Originally 04/17/1960)   Pneumonia Vaccine 45+ Years old  Completed   DEXA SCAN  Completed   Hepatitis C Screening  Completed   HPV VACCINES  Aged Out   INFLUENZA VACCINE  Discontinued    Health Maintenance  There are no preventive care reminders to display for this patient.   Colorectal cancer screening: Type of screening: Colonoscopy. Completed 08/2019. Repeat every 3 years  Mammogram status: Completed 09/07/2021. Repeat every year  Bone Density status: Completed 11/25/2021.   Lung Cancer Screening: (Low Dose CT Chest recommended if Age 23-80  years, 30 pack-year currently smoking OR have quit w/in 15years.) does not qualify.   Lung Cancer Screening Referral: no  Additional Screening:  Hepatitis C Screening: does qualify; Completed 11/10/2018  Vision Screening: Recommended annual ophthalmology exams for early detection of glaucoma and other disorders of the eye. Is the patient up to date with their annual eye exam?  Yes  Who is the provider or what is the name of the office in which the patient attends annual eye exams? [facility] If pt is not established with a provider, would they like to be referred to a provider to establish care? No .   Dental Screening: Recommended annual dental exams for proper oral hygiene  Community Resource Referral / Chronic Care Management: CRR required this visit?  No   CCM required this visit?  No      Plan:     I have personally reviewed and noted the following in the patient's chart:   Medical and social history Use of  alcohol, tobacco or illicit drugs  Current medications and supplements including opioid prescriptions.  Functional ability and status Nutritional status Physical activity Advanced directives List of other physicians Hospitalizations, surgeries, and ER visits in previous 12 months Vitals Screenings to include cognitive, depression, and falls Referrals and appointments  In addition, I have reviewed and discussed with patient certain preventive protocols, quality metrics, and best practice recommendations. A written personalized care plan for preventive services as well as general preventive health recommendations were provided to patient.     Kellie Simmering, LPN   [id number]   Nurse Notes: none

## 2022-04-19 ENCOUNTER — Encounter: Payer: Self-pay | Admitting: Physician Assistant

## 2022-04-19 ENCOUNTER — Ambulatory Visit (INDEPENDENT_AMBULATORY_CARE_PROVIDER_SITE_OTHER): Payer: Medicare Other | Admitting: Physician Assistant

## 2022-04-19 VITALS — BP 118/72 | HR 72 | Ht 61.0 in | Wt 130.0 lb

## 2022-04-19 DIAGNOSIS — R110 Nausea: Secondary | ICD-10-CM

## 2022-04-19 DIAGNOSIS — K5909 Other constipation: Secondary | ICD-10-CM

## 2022-04-19 DIAGNOSIS — R1013 Epigastric pain: Secondary | ICD-10-CM | POA: Diagnosis not present

## 2022-04-19 DIAGNOSIS — R1084 Generalized abdominal pain: Secondary | ICD-10-CM

## 2022-04-19 MED ORDER — NA SULFATE-K SULFATE-MG SULF 17.5-3.13-1.6 GM/177ML PO SOLN: 1.0000 | Freq: Once | ORAL | 0 refills | Status: AC

## 2022-04-19 NOTE — Patient Instructions (Signed)
If you are age 80 or older, your body mass index should be between 23-30. Your Body mass index is 24.56 kg/m. If this is out of the aforementioned range listed, please consider follow up with your Primary Care Provider. ________________________________________________________  The [facility] providers would like to encourage you to use [facility] [address] to communicate with providers for non-urgent requests or questions.  Due to long hold times on the telephone, sending your provider a message by [facility] may be a faster and more efficient way to get a response.  Please allow 48 business hours for a response.  Please remember that this is for non-urgent requests.  _______________________________________________________  Dennis Bast have been scheduled for an endoscopy and colonoscopy. Please follow the written instructions given to you at your visit today. Please pick up your prep supplies at the pharmacy within the next 1-3 days. If you use inhalers (even only as needed), please bring them with you on the day of your procedure.  Continue Trulance daily  START Miralax 1 capful in 8 ounces of water or juice twice daily.  Follow up pending the results of your Colonoscopy/Endoscopy with Dr. Rush Landmark.  Thank you for entrusting me with your care and choosing [facility].  Ellouise Newer, PA-C

## 2022-04-19 NOTE — Progress Notes (Signed)
Chief Complaint: Continued problems with constipation and nausea.  HPI:    Jennifer Hull is an 81 year old female with a past medical history as listed below including chronic constipation, known to Dr. Rush Landmark, who returns to clinic today for follow-up of chronic constipation with continued complaints of constipation and nausea.    09/02/2019 EGD and colonoscopy. Colonoscopy with 8 polyps in the rectum, erythematous mucosa in the mid rectum and distal rectum and nonbleeding nonthrombosed boast external and internal hemorrhoids. EGD with small hiatal hernia, gastritis and no gross lesions in the duodenum. Pathology was significant for gastritis and a mixture of tubular adenomas and sessile serrated polyp.  Repeat colonoscopy recommended in 3 years.    08/09/2021 patient seen in clinic and at that time discussed that Trulance worked very well for her but it was costing her a lot of money.  That time was because she had previously tried Linzess as well as Amitiza which did not work well for her.  That time she was given some more samples and we are trying to get it approved for her.    03/08/2022 patient seen in clinic and at that time unfortunately had been unable to get the Trulance paid for by her insurance.  She continued Linzess 290 mcg daily but was also taking additional magnesium supplements, MiraLAX and suppositories as well as stool softeners.  At that time prescribed Amitiza 24 mcg twice daily.  Also provided samples of Movantik.  Instructed her to do a bowel purge.  Her next colonoscopy is due in November.    03/10/2022 patient contacted [place] to let us know that her insurance lower the cost of Trulance.  She started taking it daily.    Today, patient brings her husband with her to appointment, the patient tells me that the Trulance seemed to be working for a couple of weeks, but then it started not working and she was requiring suppositories and stool softeners again.  Over the past week it seemed  to worsen to the point where this past weekend about 3 to 4 days ago she woke up and felt like she had excruciating abdominal pain, her husband even suggested they go to the ER because nothing seems to relieve it.  She was also very nauseous as she had not had a bowel movement in a couple of days.  She took some stool softeners and did a suppository and got some stool out but continued with abdominal pain which lasted until the next day.  The next day she felt very dizzy and weak and just not like herself at all.  She continued with stool softeners and MiraLAX and has had daily bowel movements since then and symptoms have slowly gotten slightly better but she is worried given ongoing problems.  Tells me she also felt like taking an extra Pantoprazole helped with some epigastric discomfort that she has developed as well.    Denies fever or chills.  Past Medical History:  Diagnosis Date   Anxiety    Arthritis    Chronic constipation    Depression    Hyperlipidemia    Hypothyroidism    Pneumonia    Rheumatoid arthritis ([place]) 2019   Temporal arteritis ([place])    Thyroid disease     Past Surgical History:  Procedure Laterality Date   ABDOMINAL HYSTERECTOMY     BREAST EXCISIONAL BIOPSY Left    BREAST SURGERY     biopsy   COLONOSCOPY     REVERSE SHOULDER ARTHROPLASTY Left  06/09/2021   Procedure: REVERSE SHOULDER ARTHROPLASTY;  Surgeon: Tania Ade, MD;  Location: [facility];  Service: Orthopedics;  Laterality: Left;   SHOULDER SURGERY Left 06/09/2021   TEMPORAL ARTERY BIOPSY / LIGATION Bilateral 2014   TONSILLECTOMY     TOTAL SHOULDER ARTHROPLASTY Right 11/22/2017   Procedure: RIGHT REVERSE TOTAL SHOULDER ARTHROPLASTY;  Surgeon: Tania Ade, MD;  Location: [facility];  Service: Orthopedics;  Laterality: Right;    Current Outpatient Medications  Medication Sig Dispense Refill   acetaminophen (TYLENOL) 500 MG tablet Take 1,000 mg by mouth every 8 (eight) hours as needed for moderate pain.      atorvastatin (LIPITOR) 20 MG tablet TAKE 1 TABLET BY MOUTH EVERY DAY 90 tablet 3   bisacodyl (DULCOLAX) 10 MG suppository Every 2 days as needed. (Patient not taking: Reported on 04/04/2022) 30 suppository 1   Calcium Carb-Cholecalciferol (CALCIUM 600+D3 PO) Take 1 tablet by mouth daily with lunch.     cholecalciferol (VITAMIN D3) 25 MCG (1000 UNIT) tablet Take 1,000 Units by mouth daily.     fluticasone (FLONASE) 50 MCG/ACT nasal spray Place 2 sprays into both nostrils at bedtime.     folic acid (FOLVITE) 096 MCG tablet Take 800 mcg by mouth daily.     gabapentin (NEURONTIN) 300 MG capsule Take 1 capsule (300 mg total) by mouth at bedtime. 30 capsule 3   levothyroxine (EUTHYROX) 112 MCG tablet Take 1 tablet (112 mcg total) by mouth daily before breakfast. 90 tablet 2   loratadine (CLARITIN) 10 MG tablet Take 1 tablet (10 mg total) by mouth daily. 30 tablet 11   magnesium oxide (MAG-OX) 400 MG tablet Take 400 mg by mouth 2 (two) times daily.     Melatonin 10 MG CAPS Take 10 mg by mouth at bedtime.     Omega-3 Fatty Acids (FISH OIL) 1000 MG CAPS Take 1,000 mg by mouth daily.     pantoprazole (PROTONIX) 40 MG tablet Take 1 tablet (40 mg total) by mouth daily. 90 tablet 0   sertraline (ZOLOFT) 100 MG tablet TAKE 1 AND 1/2 TABLETS BY MOUTH ONCE DAILY 135 tablet 2   temazepam (RESTORIL) 15 MG capsule TAKE 1 CAPSULE BY MOUTH AT BEDTIME AS NEEDED SLEEP 30 capsule 3   Tocilizumab (ACTEMRA IV) Inject into the vein every 30 (thirty) days.     TRULANCE 3 MG TABS Take 1 tablet by mouth daily.     TURMERIC CURCUMIN PO Take 3,000 mg by mouth daily.     zinc gluconate 50 MG tablet Take 50 mg by mouth daily. (Patient not taking: Reported on 04/04/2022)     No current facility-administered medications for this visit.    Allergies as of 04/19/2022 - Review Complete 04/19/2022  Allergen Reaction Noted   Tdap [tetanus-diphth-acell pertussis] Other (See Comments) 07/27/2016   Tetanus toxoid, adsorbed   12/07/2017   Hydroxychloroquine sulfate Rash 06/02/2021   Covid-19 (mrna) vaccine (pfizer) [covid-19 (mrna) vaccine] Rash 05/13/2021    Family History  Problem Relation Age of Onset   Kidney cancer Mother    Heart disease Father    Breast cancer Maternal Aunt    Colon cancer Neg Hx    Esophageal cancer Neg Hx    Rectal cancer Neg Hx    Stomach cancer Neg Hx     Social History   Socioeconomic History   Marital status: Married    Spouse name: Not on file   Number of children: Not on file   Years of education: Not on  file   Highest education level: Not on file  Occupational History   Not on file  Tobacco Use   Smoking status: Former    Packs/day: 1.50    Years: 8.00    Total pack years: 12.00    Types: Cigarettes    Quit date: 4    Years since quitting: 45.5   Smokeless tobacco: Never   Tobacco comments:    smoked infrequently when she was young; quit 81 yo   Vaping Use   Vaping Use: Never used  Substance and Sexual Activity   Alcohol use: No   Drug use: No   Sexual activity: Not Currently    Partners: Male    Birth control/protection: Surgical  Other Topics Concern   Not on file  Social History Narrative   Not on file   Social Determinants of Health   Financial Resource Strain: Low Risk  (04/04/2022)   Overall Financial Resource Strain (CARDIA)    Difficulty of Paying Living Expenses: Not hard at all  Food Insecurity: No Food Insecurity (04/04/2022)   Hunger Vital Sign    Worried About Running Out of Food in the Last Year: Never true    Ran Out of Food in the Last Year: Never true  Transportation Needs: No Transportation Needs (04/04/2022)   PRAPARE - Hydrologist (Medical): No    Lack of Transportation (Non-Medical): No  Physical Activity: Inactive (04/04/2022)   Exercise Vital Sign    Days of Exercise per Week: 0 days    Minutes of Exercise per Session: 0 min  Stress: No Stress Concern Present (04/04/2022)   [place]    Feeling of Stress : Not at all  Social Connections: Not on file  Intimate Partner Violence: Not on file    Review of Systems:    Constitutional: No weight loss, fever or chills Cardiovascular: No chest pain Respiratory: No SOB  Gastrointestinal: See HPI and otherwise negative   Physical Exam:  Vital signs: BP 118/72   Pulse 72   Ht '5\' 1"'$  (1.549 m)   Wt 130 lb (59 kg)   BMI 24.56 kg/m    Constitutional:   Pleasant Elderly female appears to be in NAD, Well developed, Well nourished, alert and cooperative Respiratory: Respirations even and unlabored. Lungs clear to auscultation bilaterally.   No wheezes, crackles, or rhonchi.  Cardiovascular: Normal S1, S2. No MRG. Regular rate and rhythm. No peripheral edema, cyanosis or pallor.  Gastrointestinal:  Soft, nondistended, nontender. No rebound or guarding. Normal bowel sounds. No appreciable masses or hepatomegaly. Rectal:  Not performed.  Psychiatric: Oriented to person, place and time. Demonstrates good judgement and reason without abnormal affect or behaviors.  No recent labs.  Assessment: 1.  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  Generalized abdominal pain: With above 3.  Nausea: Likely with reflux and constipation 4.  GERD: Chronic for the patient, typically controlled on Pantoprazole 40 mg once a day  Plan: 1.  Scheduled patient for diagnostic EGD and colonoscopy in the [facility] with Dr. Rush Landmark.  Did provide the patient a detailed list of risks for the procedures and she agrees to proceed. Patient is appropriate for endoscopic procedure(s) in the ambulatory ([place]) setting.  Recommended 2-day bowel prep. 2.  Would recommend that patient follow-up with Dr.  Mansouraty himself in clinic after time of procedures to  discuss her chronic constipation and symptoms further.  I have seen her on multiple occasions and have been unable to help her. 3.  For now would recommend the patient continue her Trulance once daily and start taking MiraLAX twice a day as well.  If she has diarrhea then can back off of the MiraLAX. 4.  Discussed with patient that it is okay to take her Pantoprazole twice a day if she feels like she needs it. 5.  Again patient to follow in clinic with Dr. Rush Landmark after time of procedures.  Ellouise Newer, PA-C [facility] 04/19/2022, 10:54 AM  Cc: [place], Betty G, MD

## 2022-04-19 NOTE — Progress Notes (Signed)
Attending Physician's Attestation   I have reviewed the chart.   I agree with the Advanced Practitioner's note, impression, and recommendations with any updates as below.    Shterna Laramee Mansouraty, MD [facility] Advanced Endoscopy Office # [phone]  

## 2022-04-26 ENCOUNTER — Ambulatory Visit (AMBULATORY_SURGERY_CENTER): Payer: Medicare Other | Admitting: Gastroenterology

## 2022-04-26 ENCOUNTER — Encounter: Payer: Self-pay | Admitting: Gastroenterology

## 2022-04-26 VITALS — BP 114/54 | HR 62 | Temp 96.6°F | Resp 15 | Ht 61.0 in | Wt 130.0 lb

## 2022-04-26 DIAGNOSIS — R110 Nausea: Secondary | ICD-10-CM

## 2022-04-26 DIAGNOSIS — R12 Heartburn: Secondary | ICD-10-CM

## 2022-04-26 DIAGNOSIS — K449 Diaphragmatic hernia without obstruction or gangrene: Secondary | ICD-10-CM

## 2022-04-26 DIAGNOSIS — F419 Anxiety disorder, unspecified: Secondary | ICD-10-CM | POA: Diagnosis not present

## 2022-04-26 DIAGNOSIS — K209 Esophagitis, unspecified without bleeding: Secondary | ICD-10-CM | POA: Diagnosis not present

## 2022-04-26 DIAGNOSIS — R140 Abdominal distension (gaseous): Secondary | ICD-10-CM | POA: Diagnosis not present

## 2022-04-26 DIAGNOSIS — E039 Hypothyroidism, unspecified: Secondary | ICD-10-CM | POA: Diagnosis not present

## 2022-04-26 DIAGNOSIS — D123 Benign neoplasm of transverse colon: Secondary | ICD-10-CM

## 2022-04-26 DIAGNOSIS — D122 Benign neoplasm of ascending colon: Secondary | ICD-10-CM | POA: Diagnosis not present

## 2022-04-26 DIAGNOSIS — K5909 Other constipation: Secondary | ICD-10-CM

## 2022-04-26 DIAGNOSIS — K229 Disease of esophagus, unspecified: Secondary | ICD-10-CM

## 2022-04-26 DIAGNOSIS — K641 Second degree hemorrhoids: Secondary | ICD-10-CM

## 2022-04-26 DIAGNOSIS — F32A Depression, unspecified: Secondary | ICD-10-CM | POA: Diagnosis not present

## 2022-04-26 DIAGNOSIS — K2970 Gastritis, unspecified, without bleeding: Secondary | ICD-10-CM | POA: Diagnosis not present

## 2022-04-26 DIAGNOSIS — K649 Unspecified hemorrhoids: Secondary | ICD-10-CM

## 2022-04-26 MED ORDER — SODIUM CHLORIDE 0.9 % IV SOLN: 500.0000 mL | Freq: Once | INTRAVENOUS | Status: DC

## 2022-04-26 NOTE — Progress Notes (Signed)
Pt's states no medical or surgical changes since previsit or office visit. 

## 2022-04-26 NOTE — Op Note (Signed)
[facility] Patient Name: Jennifer Hull Procedure Date: 04/26/2022 3:11 PM MRN: [id number] Endoscopist: Justice Britain , MD Age: 81 Referring MD:  Date of Birth: [birth year] Gender: Female Account #: [id number] Procedure:                Colonoscopy Indications:              Surveillance: Personal history of adenomatous                            polyps on last colonoscopy 3 years ago, Incidental                            - Chronic idiopathic constipation Medicines:                Monitored Anesthesia Care Procedure:                Pre-Anesthesia Assessment:                           - Prior to the procedure, a History and Physical                            was performed, and patient medications and                            allergies were reviewed. The patient's tolerance of                            previous anesthesia was also reviewed. The risks                            and benefits of the procedure and the sedation                            options and risks were discussed with the patient.                            All questions were answered, and informed consent                            was obtained. Prior Anticoagulants: The patient has                            taken no previous anticoagulant or antiplatelet                            agents. ASA Grade Assessment: II - A patient with                            mild systemic disease. After reviewing the risks                            and benefits, the patient was deemed in  satisfactory condition to undergo the procedure.                           After obtaining informed consent, the colonoscope                            was passed under direct vision. Throughout the                            procedure, the patient's blood pressure, pulse, and                            oxygen saturations were monitored continuously. The                            [id number] [id number]-[id number] [place] SB  Colonoscope was introduced                            through the anus and advanced to the the cecum,                            identified by appendiceal orifice and ileocecal                            valve. The colonoscopy was somewhat difficult due                            to significant looping. Successful completion of                            the procedure was aided by changing the patient's                            position, using manual pressure, straightening and                            shortening the scope to obtain bowel loop reduction                            and using scope torsion. The patient tolerated the                            procedure. The quality of the bowel preparation was                            adequate. The ileocecal valve, appendiceal orifice,                            and rectum were photographed. Scope In: 3:21:46 PM Scope Out: 3:43:14 PM Scope Withdrawal Time: 0 hours 15 minutes 15 seconds  Total Procedure Duration: 0 hours 21 minutes 28 seconds  Findings:                 The digital rectal exam findings include  hemorrhoids. Pertinent negatives include no                            palpable rectal lesions.                           The colon (entire examined portion) revealed                            significantly excessive looping.                           Four sessile polyps were found in the transverse                            colon (2), hepatic flexure (1) and ascending colon                            (1). The polyps were 3 to 10 mm in size. These                            polyps were removed with a cold snare. Resection                            and retrieval were complete.                           Normal mucosa was found in the entire colon                            otherwise.                           Non-bleeding non-thrombosed external and internal                            hemorrhoids were  found during retroflexion, during                            perianal exam and during digital exam. The                            hemorrhoids were Grade II (internal hemorrhoids                            that prolapse but reduce spontaneously). Complications:            No immediate complications. Estimated Blood Loss:     Estimated blood loss was minimal. Impression:               - Hemorrhoids found on digital rectal exam.                           - There was significant looping of the colon.                           -  Four 3 to 10 mm polyps in the transverse colon,                            at the hepatic flexure and in the ascending colon,                            removed with a cold snare. Resected and retrieved.                           - Normal mucosa in the entire examined colon                            otherwise.                           - Non-bleeding non-thrombosed external and internal                            hemorrhoids. Recommendation:           - The patient will be observed post-procedure,                            until all discharge criteria are met.                           - Discharge patient to home.                           - Patient has a contact number available for                            emergencies. The signs and symptoms of potential                            delayed complications were discussed with the                            patient. Return to normal activities tomorrow.                            Written discharge instructions were provided to the                            patient.                           - High fiber diet.                           - Use FiberCon 1-2 tablets PO daily.                           - Hold current Laxative therapy with attempt at  IBSRELA use for next week. Take 1 pill daily x 2                            days and then increase to 2 pills daily (once                             before breakfast and once before dinner).                           - Update [place] as to how you are doing if this is                            effective and we can consider Rx if helpful.                           - Continue present medications otherwise.                           - Await pathology results.                           - Repeat colonoscopy in 3 years for surveillance if                            patient's comorbidities and health allow this.                           - The findings and recommendations were discussed                            with the patient.                           - The findings and recommendations were discussed                            with the patient's family. Justice Britain, MD 04/26/2022 3:58:14 PM

## 2022-04-26 NOTE — Progress Notes (Signed)
GASTROENTEROLOGY PROCEDURE H&P NOTE   Primary Care Physician: Martinique, Betty G, MD  HPI: Jennifer Hull is a 81 y.o. female who presents for EGD/Colonoscopy for evaluation of abdominal pain, bloating, dyspepsia and history of colon polyps and history of chronic constipation.  Past Medical History:  Diagnosis Date   Anxiety    Arthritis    Chronic constipation    Depression    Hyperlipidemia    Hypothyroidism    Pneumonia    Rheumatoid arthritis ([place]) 2019   Temporal arteritis ([place])    Thyroid disease    Past Surgical History:  Procedure Laterality Date   ABDOMINAL HYSTERECTOMY     BREAST EXCISIONAL BIOPSY Left    BREAST SURGERY     biopsy   COLONOSCOPY     REVERSE SHOULDER ARTHROPLASTY Left 06/09/2021   Procedure: REVERSE SHOULDER ARTHROPLASTY;  Surgeon: Tania Ade, MD;  Location: [facility];  Service: Orthopedics;  Laterality: Left;   SHOULDER SURGERY Left 06/09/2021   TEMPORAL ARTERY BIOPSY / LIGATION Bilateral 2014   TONSILLECTOMY     TOTAL SHOULDER ARTHROPLASTY Right 11/22/2017   Procedure: RIGHT REVERSE TOTAL SHOULDER ARTHROPLASTY;  Surgeon: Tania Ade, MD;  Location: [facility];  Service: Orthopedics;  Laterality: Right;   Current Outpatient Medications  Medication Sig Dispense Refill   acetaminophen (TYLENOL) 500 MG tablet Take 1,000 mg by mouth every 8 (eight) hours as needed for moderate pain.     atorvastatin (LIPITOR) 20 MG tablet TAKE 1 TABLET BY MOUTH EVERY DAY 90 tablet 3   bisacodyl (DULCOLAX) 10 MG suppository Every 2 days as needed. (Patient not taking: Reported on 04/04/2022) 30 suppository 1   Calcium Carb-Cholecalciferol (CALCIUM 600+D3 PO) Take 1 tablet by mouth daily with lunch.     cholecalciferol (VITAMIN D3) 25 MCG (1000 UNIT) tablet Take 1,000 Units by mouth daily.     fluticasone (FLONASE) 50 MCG/ACT nasal spray Place 2 sprays into both nostrils at bedtime.     folic acid (FOLVITE) 151 MCG tablet Take 800 mcg by mouth daily.     gabapentin  (NEURONTIN) 300 MG capsule Take 1 capsule (300 mg total) by mouth at bedtime. 30 capsule 3   levothyroxine (EUTHYROX) 112 MCG tablet Take 1 tablet (112 mcg total) by mouth daily before breakfast. 90 tablet 2   loratadine (CLARITIN) 10 MG tablet Take 1 tablet (10 mg total) by mouth daily. 30 tablet 11   magnesium oxide (MAG-OX) 400 MG tablet Take 400 mg by mouth 2 (two) times daily.     Melatonin 10 MG CAPS Take 10 mg by mouth at bedtime.     Omega-3 Fatty Acids (FISH OIL) 1000 MG CAPS Take 1,000 mg by mouth daily.     pantoprazole (PROTONIX) 40 MG tablet Take 1 tablet (40 mg total) by mouth daily. 90 tablet 0   sertraline (ZOLOFT) 100 MG tablet TAKE 1 AND 1/2 TABLETS BY MOUTH ONCE DAILY 135 tablet 2   temazepam (RESTORIL) 15 MG capsule TAKE 1 CAPSULE BY MOUTH AT BEDTIME AS NEEDED SLEEP 30 capsule 3   Tocilizumab (ACTEMRA IV) Inject into the vein every 30 (thirty) days.     TRULANCE 3 MG TABS Take 1 tablet by mouth daily.     TURMERIC CURCUMIN PO Take 3,000 mg by mouth daily.     zinc gluconate 50 MG tablet Take 50 mg by mouth daily.     No current facility-administered medications for this visit.    Current Outpatient Medications:    acetaminophen (TYLENOL)  500 MG tablet, Take 1,000 mg by mouth every 8 (eight) hours as needed for moderate pain., Disp: , Rfl:    atorvastatin (LIPITOR) 20 MG tablet, TAKE 1 TABLET BY MOUTH EVERY DAY, Disp: 90 tablet, Rfl: 3   bisacodyl (DULCOLAX) 10 MG suppository, Every 2 days as needed. (Patient not taking: Reported on 04/04/2022), Disp: 30 suppository, Rfl: 1   Calcium Carb-Cholecalciferol (CALCIUM 600+D3 PO), Take 1 tablet by mouth daily with lunch., Disp: , Rfl:    cholecalciferol (VITAMIN D3) 25 MCG (1000 UNIT) tablet, Take 1,000 Units by mouth daily., Disp: , Rfl:    fluticasone (FLONASE) 50 MCG/ACT nasal spray, Place 2 sprays into both nostrils at bedtime., Disp: , Rfl:    folic acid (FOLVITE) 009 MCG tablet, Take 800 mcg by mouth daily., Disp: , Rfl:     gabapentin (NEURONTIN) 300 MG capsule, Take 1 capsule (300 mg total) by mouth at bedtime., Disp: 30 capsule, Rfl: 3   levothyroxine (EUTHYROX) 112 MCG tablet, Take 1 tablet (112 mcg total) by mouth daily before breakfast., Disp: 90 tablet, Rfl: 2   loratadine (CLARITIN) 10 MG tablet, Take 1 tablet (10 mg total) by mouth daily., Disp: 30 tablet, Rfl: 11   magnesium oxide (MAG-OX) 400 MG tablet, Take 400 mg by mouth 2 (two) times daily., Disp: , Rfl:    Melatonin 10 MG CAPS, Take 10 mg by mouth at bedtime., Disp: , Rfl:    Omega-3 Fatty Acids (FISH OIL) 1000 MG CAPS, Take 1,000 mg by mouth daily., Disp: , Rfl:    pantoprazole (PROTONIX) 40 MG tablet, Take 1 tablet (40 mg total) by mouth daily., Disp: 90 tablet, Rfl: 0   sertraline (ZOLOFT) 100 MG tablet, TAKE 1 AND 1/2 TABLETS BY MOUTH ONCE DAILY, Disp: 135 tablet, Rfl: 2   temazepam (RESTORIL) 15 MG capsule, TAKE 1 CAPSULE BY MOUTH AT BEDTIME AS NEEDED SLEEP, Disp: 30 capsule, Rfl: 3   Tocilizumab (ACTEMRA IV), Inject into the vein every 30 (thirty) days., Disp: , Rfl:    TRULANCE 3 MG TABS, Take 1 tablet by mouth daily., Disp: , Rfl:    TURMERIC CURCUMIN PO, Take 3,000 mg by mouth daily., Disp: , Rfl:    zinc gluconate 50 MG tablet, Take 50 mg by mouth daily., Disp: , Rfl:  Allergies  Allergen Reactions   Tdap [Tetanus-Diphth-Acell Pertussis] Other (See Comments)    SEVERE ALLERGIC REACTIONS (NECK STIFFNESS/HIGH FEVER/ETC)   Tetanus Toxoid, Adsorbed     Neck stiffness, and high fever Other reaction(s): Other (See Comments) Neck stiffness, and high fever   Hydroxychloroquine Sulfate Rash   Covid-19 (Mrna) Vaccine Therapist, music) [Covid-19 (Mrna) Vaccine] Rash   Family History  Problem Relation Age of Onset   Kidney cancer Mother    Heart disease Father    Breast cancer Maternal Aunt    Colon cancer Neg Hx    Esophageal cancer Neg Hx    Rectal cancer Neg Hx    Stomach cancer Neg Hx    Social History   Socioeconomic History   Marital  status: Married    Spouse name: Not on file   Number of children: Not on file   Years of education: Not on file   Highest education level: Not on file  Occupational History   Not on file  Tobacco Use   Smoking status: Former    Packs/day: 1.50    Years: 8.00    Total pack years: 12.00    Types: Cigarettes    Quit date:  1978    Years since quitting: 45.5   Smokeless tobacco: Never   Tobacco comments:    smoked infrequently when she was young; quit 81 yo   Vaping Use   Vaping Use: Never used  Substance and Sexual Activity   Alcohol use: No   Drug use: No   Sexual activity: Not Currently    Partners: Male    Birth control/protection: Surgical  Other Topics Concern   Not on file  Social History Narrative   Not on file   Social Determinants of Health   Financial Resource Strain: Low Risk  (04/04/2022)   Overall Financial Resource Strain (CARDIA)    Difficulty of Paying Living Expenses: Not hard at all  Food Insecurity: No Food Insecurity (04/04/2022)   Hunger Vital Sign    Worried About Running Out of Food in the Last Year: Never true    Ran Out of Food in the Last Year: Never true  Transportation Needs: No Transportation Needs (04/04/2022)   PRAPARE - Hydrologist (Medical): No    Lack of Transportation (Non-Medical): No  Physical Activity: Inactive (04/04/2022)   Exercise Vital Sign    Days of Exercise per Week: 0 days    Minutes of Exercise per Session: 0 min  Stress: No Stress Concern Present (04/04/2022)   [place]    Feeling of Stress : Not at all  Social Connections: Not on file  Intimate Partner Violence: Not on file    Physical Exam: There were no vitals filed for this visit. There is no height or weight on file to calculate BMI. GEN: NAD EYE: Sclerae anicteric ENT: MMM CV: Non-tachycardic GI: Soft, NT/ND NEURO:  Alert & Oriented x 3  Lab Results: No results  for input(s): "WBC", "HGB", "HCT", "PLT" in the last 72 hours. BMET No results for input(s): "NA", "K", "CL", "CO2", "GLUCOSE", "BUN", "CREATININE", "CALCIUM" in the last 72 hours. LFT No results for input(s): "PROT", "ALBUMIN", "AST", "ALT", "ALKPHOS", "BILITOT", "BILIDIR", "IBILI" in the last 72 hours. PT/INR No results for input(s): "LABPROT", "INR" in the last 72 hours.   Impression / Plan: This is a 81 y.o.female who presents for EGD/Colonoscopy for evaluation of abdominal pain, bloating, dyspepsia and history of colon polyps and chronic constipation.  The risks and benefits of endoscopic evaluation/treatment were discussed with the patient and/or family; these include but are not limited to the risk of perforation, infection, bleeding, missed lesions, lack of diagnosis, severe illness requiring hospitalization, as well as anesthesia and sedation related illnesses.  The patient's history has been reviewed, patient examined, no change in status, and deemed stable for procedure.  The patient and/or family is agreeable to proceed.    Justice Britain, MD [facility]oscopy Office # [phone]

## 2022-04-26 NOTE — Progress Notes (Signed)
Called to room to assist during endoscopic procedure.  Patient ID and intended procedure confirmed with present staff. Received instructions for my participation in the procedure from the performing physician.  

## 2022-04-26 NOTE — Op Note (Signed)
[facility] Patient Name: Jennifer Hull Procedure Date: 04/26/2022 3:12 PM MRN: [id number] Endoscopist: Justice Britain , MD Age: 81 Referring MD:  Date of Birth: Mar 12, 1941 Gender: Female Account #: [id number] Procedure:                Upper GI endoscopy Indications:              Epigastric abdominal pain, Heartburn, Follow-up of                            reflux esophagitis, Abdominal bloating Medicines:                Monitored Anesthesia Care Procedure:                Pre-Anesthesia Assessment:                           - Prior to the procedure, a History and Physical                            was performed, and patient medications and                            allergies were reviewed. The patient's tolerance of                            previous anesthesia was also reviewed. The risks                            and benefits of the procedure and the sedation                            options and risks were discussed with the patient.                            All questions were answered, and informed consent                            was obtained. Prior Anticoagulants: The patient has                            taken no previous anticoagulant or antiplatelet                            agents. ASA Grade Assessment: II - A patient with                            mild systemic disease. After reviewing the risks                            and benefits, the patient was deemed in                            satisfactory condition to undergo the procedure.  After obtaining informed consent, the endoscope was                            passed under direct vision. Throughout the                            procedure, the patient's blood pressure, pulse, and                            oxygen saturations were monitored continuously. The                            Endoscope was introduced through the mouth, and                            advanced to  the second part of duodenum. The upper                            GI endoscopy was accomplished without difficulty.                            The patient tolerated the procedure. Scope In: Scope Out: Findings:                 No gross lesions were noted in the entire esophagus                            - previous esophagitis has healed.                           The Z-line was irregular and was found 34 cm from                            the incisors.                           A 2 cm hiatal hernia was present.                           Patchy mildly erythematous mucosa without bleeding                            was found in the entire examined stomach. Biopsies                            were taken with a cold forceps for histology and                            Helicobacter pylori testing.                           No gross lesions were noted in the duodenal bulb,                            in the  first portion of the duodenum and in the                            second portion of the duodenum. Complications:            No immediate complications. Estimated Blood Loss:     Estimated blood loss was minimal. Impression:               - No gross lesions in esophagus. Z-line irregular,                            34 cm from the incisors.                           - 2 cm hiatal hernia.                           - Erythematous mucosa in the stomach. Biopsied.                           - No gross lesions in the duodenal bulb, in the                            first portion of the duodenum and in the second                            portion of the duodenum. Recommendation:           - Proceed to scheduled colonoscopy.                           - Continue present medications.                           - Await pathology results.                           - The findings and recommendations were discussed                            with the patient.                           - The findings  and recommendations were discussed                            with the patient's family. Justice Britain, MD 04/26/2022 3:49:29 PM

## 2022-04-26 NOTE — Patient Instructions (Addendum)
Please read handouts provided. Continue present medications. Await pathology results. High Fiber Diet. Use FiberCon 1-2 tablets daily. Hold current Laxative therapy with attempt at [facility] use for next week. Take 1 pill daily x 2 days, then increase to 2 pills daily ( once before breakfast and once before dinner ).    YOU HAD AN ENDOSCOPIC PROCEDURE TODAY AT [facility]:   Refer to the procedure report that was given to you for any specific questions about what was found during the examination.  If the procedure report does not answer your questions, please call your gastroenterologist to clarify.  If you requested that your care partner not be given the details of your procedure findings, then the procedure report has been included in a sealed envelope for you to review at your convenience later.  YOU SHOULD EXPECT: Some feelings of bloating in the abdomen. Passage of more gas than usual.  Walking can help get rid of the air that was put into your GI tract during the procedure and reduce the bloating. If you had a lower endoscopy (such as a colonoscopy or flexible sigmoidoscopy) you may notice spotting of blood in your stool or on the toilet paper. If you underwent a bowel prep for your procedure, you may not have a normal bowel movement for a few days.  Please Note:  You might notice some irritation and congestion in your nose or some drainage.  This is from the oxygen used during your procedure.  There is no need for concern and it should clear up in a day or so.  SYMPTOMS TO REPORT IMMEDIATELY:  Following lower endoscopy (colonoscopy or flexible sigmoidoscopy):  Excessive amounts of blood in the stool  Significant tenderness or worsening of abdominal pains  Swelling of the abdomen that is new, acute  Fever of 100F or higher  Following upper endoscopy (EGD)  Vomiting of blood or coffee ground material  New chest pain or pain under the shoulder blades  Painful or  persistently difficult swallowing  New shortness of breath  Fever of 100F or higher  Black, tarry-looking stools  For urgent or emergent issues, a gastroenterologist can be reached at any hour by calling [phone]. Do not use MyChart messaging for urgent concerns.    DIET:  We do recommend a small meal at first, but then you may proceed to your regular diet.  Drink plenty of fluids but you should avoid alcoholic beverages for 24 hours.  ACTIVITY:  You should plan to take it easy for the rest of today and you should NOT DRIVE or use heavy machinery until tomorrow (because of the sedation medicines used during the test).    FOLLOW UP: Our staff will call the number listed on your records the next business day following your procedure.  We will call around 7:15- 8:00 am to check on you and address any questions or concerns that you may have regarding the information given to you following your procedure. If we do not reach you, we will leave a message.  If you develop any symptoms (ie: fever, flu-like symptoms, shortness of breath, cough etc.) before then, please call [phone].  If you test positive for Covid 19 in the 2 weeks post procedure, please call and report this information to [place].    If any biopsies were taken you will be contacted by phone or by letter within the next 1-3 weeks.  Please call us at [phone] if you have not heard about  the biopsies in 3 weeks.    SIGNATURES/CONFIDENTIALITY: You and/or your care partner have signed paperwork which will be entered into your electronic medical record.  These signatures attest to the fact that that the information above on your After Visit Summary has been reviewed and is understood.  Full responsibility of the confidentiality of this discharge information lies with you and/or your care-partner.

## 2022-04-26 NOTE — Progress Notes (Signed)
To pacu, VSS. Report to [email or web address] 

## 2022-04-27 ENCOUNTER — Other Ambulatory Visit: Payer: Self-pay

## 2022-04-27 ENCOUNTER — Encounter (HOSPITAL_BASED_OUTPATIENT_CLINIC_OR_DEPARTMENT_OTHER): Payer: Self-pay | Admitting: Emergency Medicine

## 2022-04-27 ENCOUNTER — Emergency Department (HOSPITAL_BASED_OUTPATIENT_CLINIC_OR_DEPARTMENT_OTHER): Payer: Medicare Other

## 2022-04-27 ENCOUNTER — Emergency Department (HOSPITAL_BASED_OUTPATIENT_CLINIC_OR_DEPARTMENT_OTHER)
Admission: EM | Admit: 2022-04-27 | Discharge: 2022-04-28 | Disposition: A | Discharge status: Home / Self Care | Payer: Medicare Other | Attending: Emergency Medicine | Admitting: Emergency Medicine

## 2022-04-27 ENCOUNTER — Telehealth: Payer: Self-pay | Admitting: *Deleted

## 2022-04-27 DIAGNOSIS — Z79899 Other long term (current) drug therapy: Secondary | ICD-10-CM | POA: Insufficient documentation

## 2022-04-27 DIAGNOSIS — Z96611 Presence of right artificial shoulder joint: Secondary | ICD-10-CM | POA: Diagnosis not present

## 2022-04-27 DIAGNOSIS — R079 Chest pain, unspecified: Secondary | ICD-10-CM | POA: Diagnosis not present

## 2022-04-27 DIAGNOSIS — Z96612 Presence of left artificial shoulder joint: Secondary | ICD-10-CM | POA: Diagnosis not present

## 2022-04-27 DIAGNOSIS — E039 Hypothyroidism, unspecified: Secondary | ICD-10-CM | POA: Diagnosis not present

## 2022-04-27 DIAGNOSIS — R0789 Other chest pain: Secondary | ICD-10-CM | POA: Diagnosis not present

## 2022-04-27 LAB — BASIC METABOLIC PANEL
Anion gap: 5 (ref 5–15)
BUN: 15 mg/dL (ref 8–23)
CO2: 24 mmol/L (ref 22–32)
Calcium: 8.8 mg/dL — ABNORMAL LOW (ref 8.9–10.3)
Chloride: 110 mmol/L (ref 98–111)
Creatinine, Ser: 0.66 mg/dL (ref 0.44–1.00)
GFR, Estimated: >60 mL/min (ref >60)
Glucose, Bld: 92 mg/dL (ref 70–99)
Potassium: 3.7 mmol/L (ref 3.5–5.1)
Sodium: 139 mmol/L (ref 135–145)

## 2022-04-27 LAB — CBC
HCT: 38.5 % (ref 36.0–46.0)
Hemoglobin: 13.5 g/dL (ref 12.0–15.0)
MCH: 31.9 pg (ref 26.0–34.0)
MCHC: 35.1 g/dL (ref 30.0–36.0)
MCV: 91 fL (ref 80.0–100.0)
Platelets: 192 10*3/uL (ref 150–400)
RBC: 4.23 MIL/uL (ref 3.87–5.11)
RDW: 12.9 % (ref 11.5–15.5)
WBC: 5.8 10*3/uL (ref 4.0–10.5)
nRBC: 0 % (ref 0.0–0.2)

## 2022-04-27 MED ORDER — FAMOTIDINE IN NACL 20-0.9 MG/50ML-% IV SOLN
20.0000 mg | Freq: Once | INTRAVENOUS | Status: AC
  Administered 2022-04-28: 20 mg via INTRAVENOUS
  Filled 2022-04-27: qty 50

## 2022-04-27 MED ORDER — SODIUM CHLORIDE 0.9 % IV BOLUS
500.0000 mL | Freq: Once | INTRAVENOUS | Status: AC
  Administered 2022-04-28: 500 mL via INTRAVENOUS

## 2022-04-27 MED ORDER — LIDOCAINE VISCOUS HCL 2 % MT SOLN
15.0000 mL | Freq: Once | OROMUCOSAL | Status: AC
  Administered 2022-04-28: 15 mL via ORAL
  Filled 2022-04-27: qty 15

## 2022-04-27 MED ORDER — ALUM & MAG HYDROXIDE-SIMETH 200-200-20 MG/5ML PO SUSP
30.0000 mL | Freq: Once | ORAL | Status: AC
  Administered 2022-04-28: 30 mL via ORAL
  Filled 2022-04-27: qty 30

## 2022-04-27 NOTE — Telephone Encounter (Signed)
Attempted to call patient for their post-procedure follow-up call. No answer. Unable to leave voicemail.  

## 2022-04-27 NOTE — ED Provider Notes (Signed)
[facility] EMERGENCY DEPARTMENT Provider Note   CSN: [id number] Arrival date & time: 04/27/22  2307     History  Chief Complaint  Patient presents with   Chest Pain    Jennifer Hull is a 81 y.o. female.  Patient as above with significant medical history as below, including chronic constipation, esophagitis, hiatal hernia RA, temporal arteritis, HLD, thyroid disease who presents to the ED with complaint of chest pain.  Pain reports symptoms began approximately 3 hours prior to arrival.  Does report she had endoscopy and colonoscopy yesterday, reports had some biopsies taken from the colonoscopy.  Patient reports that she was told that the endoscopy was negative.  She has been able to tolerate p.o. intake since the procedure.  No nausea or vomiting.  Chest pain described as a pressure, tightness sensation, midsternum, nonradiating.  Unable to identify alleviating or exacerbating factors.  Intermittent discomfort.  Occurs for few seconds and resolve spontaneously.  No medications prior to arrival.  No fevers, chills, nausea, vomiting, dyspnea, or trauma.   No BRBPR or melena, no change urination, no vomiting, no coughing  Past Medical History:  Diagnosis Date   Anxiety    Arthritis    Chronic constipation    Depression    Hyperlipidemia    Hypothyroidism    Pneumonia    Rheumatoid arthritis ([place]) 2019   Temporal arteritis ([place])    Thyroid disease     Past Surgical History:  Procedure Laterality Date   ABDOMINAL HYSTERECTOMY     BREAST EXCISIONAL BIOPSY Left    BREAST SURGERY     biopsy   COLONOSCOPY     REVERSE SHOULDER ARTHROPLASTY Left 06/09/2021   Procedure: REVERSE SHOULDER ARTHROPLASTY;  Surgeon: Tania Ade, MD;  Location: [facility];  Service: Orthopedics;  Laterality: Left;   SHOULDER SURGERY Left 06/09/2021   TEMPORAL ARTERY BIOPSY / LIGATION Bilateral 2014   TONSILLECTOMY     TOTAL SHOULDER ARTHROPLASTY Right 11/22/2017   Procedure: RIGHT REVERSE  TOTAL SHOULDER ARTHROPLASTY;  Surgeon: Tania Ade, MD;  Location: [facility];  Service: Orthopedics;  Laterality: Right;     The history is provided by the patient and the spouse. No language interpreter was used.  Chest Pain Associated symptoms: no abdominal pain, no back pain, no cough, no dysphagia, no fever, no headache, no nausea, no palpitations and no shortness of breath        Home Medications Prior to Admission medications   Medication Sig Start Date End Date Taking? Authorizing Provider  sucralfate (CARAFATE) 1 g tablet Take 1 tablet (1 g total) by mouth 4 (four) times daily -  with meals and at bedtime for 7 days. 04/28/22 05/05/22 Yes Jeanell Sparrow, DO  acetaminophen (TYLENOL) 500 MG tablet Take 1,000 mg by mouth every 8 (eight) hours as needed for moderate pain.    [provider]  atorvastatin (LIPITOR) 20 MG tablet TAKE 1 TABLET BY MOUTH EVERY DAY 01/16/22   [place], Betty G, MD  bisacodyl (DULCOLAX) 10 MG suppository Every 2 days as needed. Patient not taking: Reported on 04/04/2022 01/18/22   [place], Betty G, MD  Calcium Carb-Cholecalciferol (CALCIUM 600+D3 PO) Take 1 tablet by mouth daily with lunch.    [provider]  cholecalciferol (VITAMIN D3) 25 MCG (1000 UNIT) tablet Take 1,000 Units by mouth daily.    [provider]  fluticasone (FLONASE) 50 MCG/ACT nasal spray Place 2 sprays into both nostrils at bedtime.    [provider]  folic acid (FOLVITE) 268 MCG tablet Take 800 mcg by mouth daily.    [provider]  gabapentin (NEURONTIN) 300 MG capsule Take 1 capsule (300 mg total) by mouth at bedtime. 03/13/22   [place], Betty G, MD  levothyroxine (EUTHYROX) 112 MCG tablet Take 1 tablet (112 mcg total) by mouth daily before breakfast. 10/24/21   [place], Betty G, MD  loratadine (CLARITIN) 10 MG tablet Take 1 tablet (10 mg total) by mouth daily. 11/25/21   Collene Gobble, MD  magnesium oxide (MAG-OX) 400 MG tablet Take 400 mg by mouth  2 (two) times daily.    [provider]  Melatonin 10 MG CAPS Take 10 mg by mouth at bedtime.    [provider]  Omega-3 Fatty Acids (FISH OIL) 1000 MG CAPS Take 1,000 mg by mouth daily.    [provider]  pantoprazole (PROTONIX) 40 MG tablet Take 1 tablet (40 mg total) by mouth daily. 02/08/22   Levin Erp, PA  sertraline (ZOLOFT) 100 MG tablet TAKE 1 AND 1/2 TABLETS BY MOUTH ONCE DAILY 10/31/21   [place], Betty G, MD  temazepam (RESTORIL) 15 MG capsule TAKE 1 CAPSULE BY MOUTH AT BEDTIME AS NEEDED SLEEP 01/09/22   [place], Betty G, MD  Tocilizumab (ACTEMRA IV) Inject into the vein every 30 (thirty) days.    [provider]  TRULANCE 3 MG TABS Take 1 tablet by mouth daily. 04/02/22   [provider]  TURMERIC CURCUMIN PO Take 3,000 mg by mouth daily.    [provider]  zinc gluconate 50 MG tablet Take 50 mg by mouth daily.    [provider]      Allergies    Tdap [tetanus-diphth-acell pertussis]; Tetanus toxoid, adsorbed; Hydroxychloroquine sulfate; and Covid-19 (mrna) vaccine (pfizer) [covid-19 (mrna) vaccine]    Review of Systems   Review of Systems  Constitutional:  Negative for activity change and fever.  HENT:  Negative for facial swelling and trouble swallowing.   Eyes:  Negative for discharge and redness.  Respiratory:  Negative for cough and shortness of breath.   Cardiovascular:  Positive for chest pain. Negative for palpitations.  Gastrointestinal:  Negative for abdominal pain and nausea.  Genitourinary:  Negative for dysuria and flank pain.  Musculoskeletal:  Negative for back pain and gait problem.  Skin:  Negative for pallor and rash.  Neurological:  Negative for syncope and headaches.    Physical Exam Updated Vital Signs BP (!) 142/66 (BP Location: Right Arm)   Pulse (!) 57   Temp 98 F (36.7 C) (Oral)   Resp (!) 22   Ht '5\' 1"'$  (1.549 m)   Wt 59 kg   SpO2 94%   BMI 24.58 kg/m  Physical  Exam Vitals and nursing note reviewed.  Constitutional:      General: She is not in acute distress.    Appearance: Normal appearance. She is well-developed. She is not ill-appearing, toxic-appearing or diaphoretic.  HENT:     Head: Normocephalic and atraumatic.     Right Ear: External ear normal.     Left Ear: External ear normal.     Nose: Nose normal.     Mouth/Throat:     Mouth: Mucous membranes are moist.  Eyes:     General: No scleral icterus.       Right eye: No discharge.        Left eye: No discharge.  Cardiovascular:     Rate and Rhythm: Normal rate and regular  rhythm.     Pulses: Normal pulses.          Radial pulses are 2+ on the right side and 2+ on the left side.     Heart sounds: Normal heart sounds. No murmur heard.    No S3 or S4 sounds.  Pulmonary:     Effort: Pulmonary effort is normal. No respiratory distress.     Breath sounds: Normal breath sounds.  Abdominal:     General: Abdomen is flat.     Tenderness: There is no abdominal tenderness.  Musculoskeletal:        General: Normal range of motion.     Cervical back: Normal range of motion.     Right lower leg: No edema.     Left lower leg: No edema.  Skin:    General: Skin is warm and dry.     Capillary Refill: Capillary refill takes less than 2 seconds.  Neurological:     Mental Status: She is alert and oriented to person, place, and time.     GCS: GCS eye subscore is 4. GCS verbal subscore is 5. GCS motor subscore is 6.  Psychiatric:        Mood and Affect: Mood normal.        Behavior: Behavior normal.     ED Results / Procedures / Treatments   Labs (all labs ordered are listed, but only abnormal results are displayed) Labs Reviewed  BASIC METABOLIC PANEL - Abnormal; Notable for the following components:      Result Value   Calcium 8.8 (*)    All other components within normal limits  LIPASE, BLOOD - Abnormal; Notable for the following components:   Lipase 62 (*)    All other components  within normal limits  HEPATIC FUNCTION PANEL - Abnormal; Notable for the following components:   Total Protein 6.2 (*)    Indirect Bilirubin 0.2 (*)    All other components within normal limits  CBC  TROPONIN I (HIGH SENSITIVITY)  TROPONIN I (HIGH SENSITIVITY)    EKG EKG Interpretation  Date/Time:  Thursday April 27 2022 23:23:06 EDT Ventricular Rate:  57 PR Interval:  146 QRS Duration: 94 QT Interval:  419 QTC Calculation: 408 R Axis:   38 Text Interpretation: Sinus rhythm Borderline T abnormalities, anterior leads No significant change since last tracing no stemi Confirmed by Wynona Dove ([id number]) on 04/27/2022 11:49:13 PM  Radiology DG Chest Port 1 View  Result Date: 04/27/2022 CLINICAL DATA:  Chest pain EXAM: PORTABLE CHEST 1 VIEW COMPARISON:  CT chest dated 03/15/2021 FINDINGS: Low lung volumes. Lungs are essentially clear. No pleural effusion or pneumothorax. The heart is normal in size. Bilateral shoulder arthroplasty. IMPRESSION: No evidence of acute cardiopulmonary disease. Electronically Signed   By: Julian Hy M.D.   On: 04/27/2022 23:58    Procedures Procedures    Medications Ordered in ED Medications  famotidine (PEPCID) IVPB 20 mg premix (0 mg Intravenous Stopped 04/28/22 0049)  alum & mag hydroxide-simeth (MAALOX/MYLANTA) 200-200-20 MG/5ML suspension 30 mL (30 mLs Oral Given 04/28/22 0000)    And  lidocaine (XYLOCAINE) 2 % viscous mouth solution 15 mL (15 mLs Oral Given 04/28/22 0000)  sodium chloride 0.9 % bolus 500 mL (0 mLs Intravenous Stopped 04/28/22 0049)    ED Course/ Medical Decision Making/ A&P                           Medical Decision Making Amount  and/or Complexity of Data Reviewed Labs: ordered. Radiology: ordered.  Risk OTC drugs. Prescription drug management.    CC: Chest pain  This patient presents to the Emergency Department for the above complaint. This involves an extensive number of treatment options and is a complaint that  carries with it a high risk of complications and morbidity. Vital signs were reviewed. Serious etiologies considered.  Differential includes all life-threatening causes for chest pain. This includes but is not exclusive to acute coronary syndrome, aortic dissection, pulmonary embolism, cardiac tamponade, community-acquired pneumonia, pericarditis, musculoskeletal chest wall pain, etc.  Record review:  Previous records obtained and reviewed prior office visits, prior procedures, prior labs and imaging  Additional history obtained from spouse  Medical and surgical history as noted above.   Work up as above, notable for:  Labs & imaging results that were available during my care of the patient were visualized by me and considered in my medical decision making.  Physical exam as above.   I ordered imaging studies which included chest x-ray. I visualized the imaging, interpreted images, and I agree with radiologist interpretation. No acute process  Cardiac monitoring reviewed and interpreted personally which shows NSR, EKG without acute ischemic changes  Management: GI cocktail  ED Course:     Reassessment:  Asymptomatic  She is tolerant p.o. intake without difficulty. Patient with recent endoscopy, chest x-ray unremarkable.  No evidence of perforation.  She is tolerant p.o. intake without difficulty.  No dyspnea.  No nausea or vomiting.  I have low suspicion for esophageal perforation.   Admission was considered.    The patient's chest pain is not suggestive of pulmonary embolus, cardiac ischemia, aortic dissection, pericarditis, myocarditis, pulmonary embolism, pneumothorax, pneumonia, Zoster, or esophageal perforation, or other serious etiology.  Historically not abrupt in onset, tearing or ripping, pulses symmetric. EKG nonspecific for ischemia/infarction. No dysrhythmias, brugada, WPW, prolonged QT noted.   Troponin negative x2. CXR reviewed. Labs without demonstration of acute  pathology unless otherwise noted above. Low HEART Score: 0-3 points (0.9-1.7% risk of MACE).  Given the extremely low risk of these diagnoses further testing and evaluation for these possibilities does not appear to be indicated at this time. Patient in no distress and overall condition improved here in the ED. Detailed discussions were had with the patient regarding current findings, and need for close f/u with PCP or on call doctor. The patient has been instructed to return immediately if the symptoms worsen in any way for re-evaluation. Patient verbalized understanding and is in agreement with current care plan. All questions answered prior to discharge.             Social determinants of health include -  Social History   Socioeconomic History   Marital status: Married    Spouse name: Not on file   Number of children: Not on file   Years of education: Not on file   Highest education level: Not on file  Occupational History   Not on file  Tobacco Use   Smoking status: Former    Packs/day: 1.50    Years: 8.00    Total pack years: 12.00    Types: Cigarettes    Quit date: 67    Years since quitting: 45.5   Smokeless tobacco: Never   Tobacco comments:    smoked infrequently when she was young; quit 81 yo   Vaping Use   Vaping Use: Never used  Substance and Sexual Activity   Alcohol use: No   Drug  use: No   Sexual activity: Not Currently    Partners: Male    Birth control/protection: Surgical  Other Topics Concern   Not on file  Social History Narrative   Not on file   Social Determinants of Health   Financial Resource Strain: Low Risk  (04/04/2022)   Overall Financial Resource Strain (CARDIA)    Difficulty of Paying Living Expenses: Not hard at all  Food Insecurity: No Food Insecurity (04/04/2022)   Hunger Vital Sign    Worried About Running Out of Food in the Last Year: Never true    Ran Out of Food in the Last Year: Never true  Transportation Needs: No  Transportation Needs (04/04/2022)   PRAPARE - Hydrologist (Medical): No    Lack of Transportation (Non-Medical): No  Physical Activity: Inactive (04/04/2022)   Exercise Vital Sign    Days of Exercise per Week: 0 days    Minutes of Exercise per Session: 0 min  Stress: No Stress Concern Present (04/04/2022)   [place]    Feeling of Stress : Not at all  Social Connections: Not on file  Intimate Partner Violence: Not on file      This chart was dictated using Armed forces training and education officer.  Despite best efforts to proofread,  errors can occur which can change the documentation meaning.         Final Clinical Impression(s) / ED Diagnoses Final diagnoses:  Atypical chest pain    Rx / DC Orders ED Discharge Orders          Ordered    sucralfate (CARAFATE) 1 g tablet  3 times daily with meals & bedtime        04/28/22 [place]on, Francisco Eyerly A, DO 04/28/22 0221

## 2022-04-27 NOTE — ED Triage Notes (Signed)
Pt c/o intermittent central chest pain x 3 hours. Denies shob, dizziness, radiation. Had endoscopy yesterday.

## 2022-04-27 NOTE — ED Notes (Signed)
MD at the Bedside. 

## 2022-04-28 DIAGNOSIS — R0789 Other chest pain: Secondary | ICD-10-CM | POA: Diagnosis not present

## 2022-04-28 LAB — HEPATIC FUNCTION PANEL
ALT: 14 U/L (ref 0–44)
AST: 21 U/L (ref 15–41)
Albumin: 4.2 g/dL (ref 3.5–5.0)
Alkaline Phosphatase: 39 U/L (ref 38–126)
Bilirubin, Direct: 0.2 mg/dL (ref 0.0–0.2)
Indirect Bilirubin: 0.2 mg/dL — ABNORMAL LOW (ref 0.3–0.9)
Total Bilirubin: 0.4 mg/dL (ref 0.3–1.2)
Total Protein: 6.2 g/dL — ABNORMAL LOW (ref 6.5–8.1)

## 2022-04-28 LAB — TROPONIN I (HIGH SENSITIVITY)
Troponin I (High Sensitivity): 3 ng/L (ref <18)
Troponin I (High Sensitivity): 3 ng/L (ref <18)

## 2022-04-28 LAB — LIPASE, BLOOD: Lipase: 62 U/L — ABNORMAL HIGH (ref 11–51)

## 2022-04-28 MED ORDER — SUCRALFATE 1 G PO TABS: 1.0000 g | ORAL_TABLET | Freq: Three times a day (TID) | ORAL | 0 refills | Status: DC

## 2022-04-28 NOTE — Discharge Instructions (Addendum)
It was a pleasure caring for you today in the emergency department. ° °Please return to the emergency department for any worsening or worrisome symptoms. ° ° °

## 2022-04-28 NOTE — ED Notes (Signed)
RN provided AVS using Teachback Method. Patient verbalizes understanding of Discharge Instructions. Opportunity for Questioning and Answers were provided by RN. Patient Discharged from ED ambulatory to Home with Family. ? ?

## 2022-05-01 ENCOUNTER — Encounter: Payer: Self-pay | Admitting: Gastroenterology

## 2022-05-01 DIAGNOSIS — M0579 Rheumatoid arthritis with rheumatoid factor of multiple sites without organ or systems involvement: Secondary | ICD-10-CM | POA: Diagnosis not present

## 2022-05-02 ENCOUNTER — Encounter: Payer: Self-pay | Admitting: Gastroenterology

## 2022-05-02 ENCOUNTER — Other Ambulatory Visit: Payer: Self-pay

## 2022-05-02 MED ORDER — GLYCERIN (ADULT) 2 G RE SUPP: 1.0000 | RECTAL | 2 refills | Status: AC | PRN

## 2022-05-02 NOTE — Telephone Encounter (Signed)
Patty, Please let the patient know I am sorry that the IBSrela does not seem to be working, though she should continue and complete it completely. If we have samples of Motegrity then I would like for her to have those and she can pick those up as able. She can continue Colace 200 mg daily. She can continue to use MiraLAX once to twice daily. She can Dulcolax orally 10-20 mg on an every other day basis if she has not had a bowel movement in 2 to 3 days. She can also be given a prescription for glycerin suppositories to use if no bowel movement after 3 days (10 with 2 refills). I also discussed that we would consider periodic bowel preparations or half bowel preparations (not to include MiraLAX) that could be used periodically if she is not experiencing regularity. 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 She should be set up for a follow-up in clinic with me in the next few weeks to see where things stand. Thanks. GM

## 2022-05-05 DIAGNOSIS — M2042 Other hammer toe(s) (acquired), left foot: Secondary | ICD-10-CM | POA: Diagnosis not present

## 2022-05-08 ENCOUNTER — Other Ambulatory Visit: Payer: Self-pay | Admitting: Family Medicine

## 2022-05-08 DIAGNOSIS — Z96612 Presence of left artificial shoulder joint: Secondary | ICD-10-CM | POA: Diagnosis not present

## 2022-05-08 DIAGNOSIS — G629 Polyneuropathy, unspecified: Secondary | ICD-10-CM

## 2022-05-08 DIAGNOSIS — Z471 Aftercare following joint replacement surgery: Secondary | ICD-10-CM | POA: Diagnosis not present

## 2022-05-25 ENCOUNTER — Other Ambulatory Visit: Payer: Self-pay

## 2022-05-25 MED ORDER — TRULANCE 3 MG PO TABS: 1.0000 | ORAL_TABLET | Freq: Every day | ORAL | 6 refills | Status: DC

## 2022-05-29 ENCOUNTER — Telehealth: Payer: Self-pay | Admitting: Emergency Medicine

## 2022-05-29 DIAGNOSIS — R918 Other nonspecific abnormal finding of lung field: Secondary | ICD-10-CM

## 2022-05-29 NOTE — Telephone Encounter (Signed)
I have ordered CT chest with OV with RB for afterwards.

## 2022-05-29 NOTE — Telephone Encounter (Addendum)
I have scheduled pt's CT and called and gave her appt info and made follow up appt with RB for her.  Nothing further needed.

## 2022-05-30 DIAGNOSIS — Z6822 Body mass index (BMI) 22.0-22.9, adult: Secondary | ICD-10-CM | POA: Diagnosis not present

## 2022-05-30 DIAGNOSIS — M7989 Other specified soft tissue disorders: Secondary | ICD-10-CM | POA: Diagnosis not present

## 2022-05-30 DIAGNOSIS — M5136 Other intervertebral disc degeneration, lumbar region: Secondary | ICD-10-CM | POA: Diagnosis not present

## 2022-05-30 DIAGNOSIS — M0589 Other rheumatoid arthritis with rheumatoid factor of multiple sites: Secondary | ICD-10-CM | POA: Diagnosis not present

## 2022-05-30 DIAGNOSIS — M8589 Other specified disorders of bone density and structure, multiple sites: Secondary | ICD-10-CM | POA: Diagnosis not present

## 2022-05-30 DIAGNOSIS — M1991 Primary osteoarthritis, unspecified site: Secondary | ICD-10-CM | POA: Diagnosis not present

## 2022-05-30 DIAGNOSIS — M316 Other giant cell arteritis: Secondary | ICD-10-CM | POA: Diagnosis not present

## 2022-06-05 ENCOUNTER — Encounter (HOSPITAL_BASED_OUTPATIENT_CLINIC_OR_DEPARTMENT_OTHER): Payer: Self-pay

## 2022-06-05 ENCOUNTER — Ambulatory Visit (HOSPITAL_BASED_OUTPATIENT_CLINIC_OR_DEPARTMENT_OTHER)
Admission: RE | Admit: 2022-06-05 | Discharge: 2022-06-05 | Disposition: A | Discharge status: Home / Self Care | Payer: Medicare Other | Source: Ambulatory Visit | Attending: Emergency Medicine | Admitting: Emergency Medicine

## 2022-06-05 DIAGNOSIS — I700 Atherosclerosis of aorta: Secondary | ICD-10-CM | POA: Diagnosis not present

## 2022-06-05 DIAGNOSIS — M0579 Rheumatoid arthritis with rheumatoid factor of multiple sites without organ or systems involvement: Secondary | ICD-10-CM | POA: Diagnosis not present

## 2022-06-05 DIAGNOSIS — R918 Other nonspecific abnormal finding of lung field: Secondary | ICD-10-CM | POA: Insufficient documentation

## 2022-06-05 DIAGNOSIS — R911 Solitary pulmonary nodule: Secondary | ICD-10-CM | POA: Diagnosis not present

## 2022-06-15 ENCOUNTER — Other Ambulatory Visit: Payer: Self-pay

## 2022-06-15 MED ORDER — PANTOPRAZOLE SODIUM 40 MG PO TBEC: 40.0000 mg | DELAYED_RELEASE_TABLET | Freq: Every day | ORAL | 1 refills | Status: DC

## 2022-06-19 DIAGNOSIS — M19072 Primary osteoarthritis, left ankle and foot: Secondary | ICD-10-CM | POA: Diagnosis not present

## 2022-06-21 ENCOUNTER — Ambulatory Visit (INDEPENDENT_AMBULATORY_CARE_PROVIDER_SITE_OTHER): Payer: Medicare Other | Admitting: Gastroenterology

## 2022-06-21 ENCOUNTER — Encounter: Payer: Self-pay | Admitting: Gastroenterology

## 2022-06-21 VITALS — BP 108/62 | HR 85 | Ht 63.0 in | Wt 129.1 lb

## 2022-06-21 DIAGNOSIS — K581 Irritable bowel syndrome with constipation: Secondary | ICD-10-CM

## 2022-06-21 DIAGNOSIS — R1013 Epigastric pain: Secondary | ICD-10-CM

## 2022-06-21 DIAGNOSIS — K5904 Chronic idiopathic constipation: Secondary | ICD-10-CM

## 2022-06-21 DIAGNOSIS — K2960 Other gastritis without bleeding: Secondary | ICD-10-CM | POA: Diagnosis not present

## 2022-06-21 DIAGNOSIS — K2970 Gastritis, unspecified, without bleeding: Secondary | ICD-10-CM

## 2022-06-21 MED ORDER — SUCRALFATE 1 G PO TABS: 1.0000 g | ORAL_TABLET | Freq: Two times a day (BID) | ORAL | 6 refills | Status: DC

## 2022-06-21 NOTE — Progress Notes (Signed)
[place] VISIT   Primary Care Provider Martinique, Betty G, MD [place] [address] [phone]  Patient Profile: Jennifer Hull is a 81 y.o. female with a pmh significant for hypothyroidism, hyperlipidemia, MDD, anxiety, arthritis, temporal arteritis, hiatal hernia, colon polyps (TA's/SSP's), CIC/IBS-C, hemorrhoids.  The patient presents to the [facility] for an evaluation and management of problem(s) noted below:  Problem List 1. Abdominal pain, epigastric   2. Other gastritis without hemorrhage, unspecified chronicity   3. Chronic idiopathic constipation   4. Irritable bowel syndrome with constipation     History of Present Illness Please see prior notes for full details of HPI.  Interval History The patient returns for follow-up.  She was last seen by me for her EGD/colonoscopy with results as below.  She continues to struggle with constipation.  We gave her samples of IBSrela but this was not effective for her.  Her current regimen includes Trulance daily, MiraLAX once daily, Dulcolax every for 5 days, glycerin suppository and she will have at least 1 bowel movement to 2 bowel movements per week.  Trulance is becoming quite expensive for her and wonders if the previous Linzess may be effective and more cost effective for her.  She is also been experiencing this new upper abdominal discomfort.  She eventually went to urgent care and was given a GI cocktail and Carafate for 1 week with improvement in her pain.  She did well for a couple weeks and then this recurred.  She is using a [place] sweet potato pure on a daily basis and this has been helpful for her.  Her discomfort is more manageable.  She is wondering what the cause of this may be.  Her weight has been stable.  She is not taking any new nonsteroidals or BC/Goody powders.  GI Review of Systems Positive as above Negative for dysphagia, odynophagia, early  satiety, melena, hematochezia  Review of Systems General: Denies fevers/chills/weight loss unintentionally Cardiovascular: Denies chest pain Pulmonary: Denies shortness of breath that Gastroenterological: See HPI Genitourinary: Denies darkened urine at Hematological: Denies easy bruising/bleeding Dermatological: Denies jaundice Psychological: Mood is stable   Medications Current Outpatient Medications  Medication Sig Dispense Refill   acetaminophen (TYLENOL) 500 MG tablet Take 1,000 mg by mouth every 8 (eight) hours as needed for moderate pain.     atorvastatin (LIPITOR) 20 MG tablet TAKE 1 TABLET BY MOUTH EVERY DAY 90 tablet 3   bisacodyl (DULCOLAX) 10 MG suppository Every 2 days as needed. 30 suppository 1   Calcium Carb-Cholecalciferol (CALCIUM 600+D3 PO) Take 1 tablet by mouth daily with lunch.     cholecalciferol (VITAMIN D3) 25 MCG (1000 UNIT) tablet Take 2,000 Units by mouth daily.     fluticasone (FLONASE) 50 MCG/ACT nasal spray Place 2 sprays into both nostrils at bedtime.     folic acid (FOLVITE) 063 MCG tablet Take 800 mcg by mouth daily.     gabapentin (NEURONTIN) 300 MG capsule TAKE 1 CAPSULE BY MOUTH THREE TIMES DAILY 90 capsule 5   levothyroxine (EUTHYROX) 112 MCG tablet Take 1 tablet (112 mcg total) by mouth daily before breakfast. 90 tablet 2   loratadine (CLARITIN) 10 MG tablet Take 1 tablet (10 mg total) by mouth daily. 30 tablet 11   magnesium oxide (MAG-OX) 400 MG tablet Take 400 mg by mouth 2 (two) times daily.     Melatonin 10 MG CAPS Take 10 mg by mouth at bedtime.     Omega-3  Fatty Acids (FISH OIL) 1000 MG CAPS Take 1,000 mg by mouth daily.     pantoprazole (PROTONIX) 40 MG tablet Take 1 tablet (40 mg total) by mouth daily. 90 tablet 1   sertraline (ZOLOFT) 100 MG tablet TAKE 1 AND 1/2 TABLETS BY MOUTH ONCE DAILY 135 tablet 2   sucralfate (CARAFATE) 1 g tablet Take 1 tablet (1 g total) by mouth in the morning and at bedtime. (Patient taking differently: Take 2  g by mouth in the morning and at bedtime.) 60 tablet 6   temazepam (RESTORIL) 15 MG capsule TAKE 1 CAPSULE BY MOUTH AT BEDTIME AS NEEDED SLEEP 30 capsule 3   Tocilizumab (ACTEMRA IV) Inject into the vein every 30 (thirty) days.     TRULANCE 3 MG TABS Take 1 tablet by mouth daily. 30 tablet 6   TURMERIC CURCUMIN PO Take 3,000 mg by mouth daily.     zinc gluconate 50 MG tablet Take 50 mg by mouth daily.     No current facility-administered medications for this visit.    Allergies Allergies  Allergen Reactions   Tdap [Tetanus-Diphth-Acell Pertussis] Other (See Comments)    SEVERE ALLERGIC REACTIONS (NECK STIFFNESS/HIGH FEVER/ETC)   Tetanus Toxoid, Adsorbed     Neck stiffness, and high fever Other reaction(s): Other (See Comments) Neck stiffness, and high fever   Hydroxychloroquine Sulfate Rash   Covid-19 (Mrna) Vaccine Therapist, music) [Covid-19 (Mrna) Vaccine] Rash    Histories Past Medical History:  Diagnosis Date   Anxiety    Arthritis    Chronic constipation    Depression    Hyperlipidemia    Hypothyroidism    Pneumonia    Rheumatoid arthritis ([place]) 2019   Temporal arteritis ([place])    Thyroid disease    Past Surgical History:  Procedure Laterality Date   ABDOMINAL HYSTERECTOMY     BREAST EXCISIONAL BIOPSY Left    BREAST SURGERY     biopsy   COLONOSCOPY     REVERSE SHOULDER ARTHROPLASTY Left 06/09/2021   Procedure: REVERSE SHOULDER ARTHROPLASTY;  Surgeon: Tania Ade, MD;  Location: [facility];  Service: Orthopedics;  Laterality: Left;   SHOULDER SURGERY Left 06/09/2021   TEMPORAL ARTERY BIOPSY / LIGATION Bilateral 2014   TONSILLECTOMY     TOTAL SHOULDER ARTHROPLASTY Right 11/22/2017   Procedure: RIGHT REVERSE TOTAL SHOULDER ARTHROPLASTY;  Surgeon: Tania Ade, MD;  Location: [facility];  Service: Orthopedics;  Laterality: Right;   Social History   Socioeconomic History   Marital status: Married    Spouse name: Not on file   Number of children: Not on file   Years of  education: Not on file   Highest education level: Not on file  Occupational History   Not on file  Tobacco Use   Smoking status: Former    Packs/day: 1.50    Years: 8.00    Total pack years: 12.00    Types: Cigarettes    Quit date: 96    Years since quitting: 45.7   Smokeless tobacco: Never   Tobacco comments:    smoked infrequently when she was young; quit 81 yo   Vaping Use   Vaping Use: Never used  Substance and Sexual Activity   Alcohol use: No   Drug use: No   Sexual activity: Not Currently    Partners: Male    Birth control/protection: Surgical  Other Topics Concern   Not on file  Social History Narrative   Not on file   Social Determinants of Health   Financial  Resource Strain: Low Risk  (04/04/2022)   Overall Financial Resource Strain (CARDIA)    Difficulty of Paying Living Expenses: Not hard at all  Food Insecurity: No Food Insecurity (04/04/2022)   Hunger Vital Sign    Worried About Running Out of Food in the Last Year: Never true    Ran Out of Food in the Last Year: Never true  Transportation Needs: No Transportation Needs (04/04/2022)   PRAPARE - Hydrologist (Medical): No    Lack of Transportation (Non-Medical): No  Physical Activity: Inactive (04/04/2022)   Exercise Vital Sign    Days of Exercise per Week: 0 days    Minutes of Exercise per Session: 0 min  Stress: No Stress Concern Present (04/04/2022)   [place]    Feeling of Stress : Not at all  Social Connections: Not on file  Intimate Partner Violence: Not on file   Family History  Problem Relation Age of Onset   Kidney cancer Mother    Heart disease Father    Breast cancer Maternal Aunt    Colon cancer Neg Hx    Esophageal cancer Neg Hx    Rectal cancer Neg Hx    Stomach cancer Neg Hx    Inflammatory bowel disease Neg Hx    Liver disease Neg Hx    Pancreatic cancer Neg Hx    I have reviewed her  medical, social, and family history in detail and updated the electronic medical record as necessary.    PHYSICAL EXAMINATION  BP 108/62   Pulse 85   Ht '5\' 3"'$  (1.6 m)   Wt 129 lb 2 oz (58.6 kg)   BMI 22.87 kg/m  Wt Readings from Last 3 Encounters:  06/22/22 129 lb (58.5 kg)  06/21/22 129 lb 2 oz (58.6 kg)  04/27/22 130 lb 1.1 oz (59 kg)  GEN: NAD, appears younger than stated age, doesn't appear chronically ill, husband with her today PSYCH: Cooperative, without pressured speech EYE: Conjunctivae pink, sclerae anicteric ENT: MMM CV: Nontachycardic RESP: No audible wheezing GI: NABS, soft, NT/ND, without rebound or guarding MSK/EXT: No lower extremity edema SKIN: No jaundice NEURO:  Alert & Oriented x 3, no focal deficits   REVIEW OF DATA  I reviewed the following data at the time of this encounter:  GI Procedures and Studies  July 2023 EGD - No gross lesions in esophagus. Z-line irregular, 34 cm from the incisors. - 2 cm hiatal hernia. - Erythematous mucosa in the stomach. Biopsied. - No gross lesions in the duodenal bulb, in the first portion of the duodenum and in the second portion of the duodenum.  July 2023 colonoscopy - Hemorrhoids found on digital rectal exam. - There was significant looping of the colon. - Four 3 to 10 mm polyps in the transverse colon, at the hepatic flexure and in the ascending colon, removed with a cold snare. Resected and retrieved. - Normal mucosa in the entire examined colon otherwise. - Non-bleeding non-thrombosed external and internal hemorrhoids.  Pathology Diagnosis 1. Surgical [P], gastric biopsies FRAGMENTS OF GASTRIC MUCOSA WITH MINIMAL INFLAMMATION. NO DEFINITE INTESTINAL METAPLASIA, H. PYLORI-LIKE ORGANISMS, DYSPLASIA OR MALIGNANCY IS SEEN ON H&E STAIN. 2. Surgical [P], colon, transverse and ascending, polyp (4) FINDINGS CONSISTENT WITH TUBULAR ADENOMA X 2 AND SESSILE SERRATED POLYP X 2. CLINICAL AND ENDOSCOPIC CORRELATION IS  REQUIRED.  Laboratory Studies  Reviewed those in epic  Imaging Studies  No new studies to review  ASSESSMENT  Ms. Thibeau is a 81 y.o. female with a pmh significant for hypothyroidism, hyperlipidemia, MDD, anxiety, arthritis, temporal arteritis, hiatal hernia, colon polyps (TA's/SSP's), CIC/IBS-C, hemorrhoids.  The patient is seen today for evaluation and management of:  1. Abdominal pain, epigastric   2. Other gastritis without hemorrhage, unspecified chronicity   3. Chronic idiopathic constipation   4. Irritable bowel syndrome with constipation    The patient is hemodynamically stable.  Her abdominal epigastric pain is newer in origin.  Overt findings of severe peptic ulcer disease was not present though she did have some mild gastritis.  Seems to have done better on Carafate therapy which was given by urgent care.  I am okay with the patient being given a new prescription for Carafate to be used twice daily as needed.  Is not clear to me that this is abdominal cramping but could be a bit of functional dyspepsia.  We need to evaluate her gallbladder region.  If she continues to have issues then we have a negative abdominal ultrasound, then cross-sectional imaging will be needed.  Time will tell.  In regards to her CIC/IBS-C, she is a difficult patient to treat.  We will going to try to be more aggressive with her MiraLAX therapy.  As there is a chance that from a financial standpoint Linzess may be more beneficial for her and her family, we are going to trial her on Linzess 290 mcg daily (she already has this from previous prescription) with using MiraLAX 2-3 times per day, Dulcolax 10 mg every other day or every third day, and glycerin suppository as a rescue.  Hopefully this regimen will allow her to have at least 3 bowel movements per week.  If the Linzess trial works we can resend a new prescription.  If it does not then she will continue Trulance with the same MiraLAX/Dulcolax/glycerin  suppository therapy as outlined above.  We also discussed potential monthly or every couple months a cleanout with MiraLAX therapy preparation.  Recommend hold on that for now.  We will see how she does.  All patient questions were answered to the best of my ability, and the patient agrees to the aforementioned plan of action with follow-up as indicated.   PLAN  Continue PPI daily Start Carafate twice daily as needed Bowel regimen - MiraLAX 2-3 times daily - Dulcolax 10 mg every second or third day - Glycerin suppository for rescue if no bowel movement by third day - Daily Trulance or daily Linzess 290 mcg Abdominal ultrasound to be obtained If patient continues to have issues we will consider cross-sectional imaging in the near future   Orders Placed This Encounter  Procedures   US Abdomen Complete    New Prescriptions   SUCRALFATE (CARAFATE) 1 G TABLET    Take 1 tablet (1 g total) by mouth in the morning and at bedtime.   Modified Medications   No medications on file    Planned Follow Up No follow-ups on file.   Total Time in Face-to-Face and in Coordination of Care for patient including independent/personal interpretation/review of prior testing, medical history, examination, medication adjustment, communicating results with the patient directly, and documentation within the EHR is 25 minutes.   Justice Britain, MD [facility] Office # [phone]

## 2022-06-21 NOTE — Patient Instructions (Signed)
_______________________________________________________  If you are age 80 or older, your body mass index should be between 23-30. Your Body mass index is 22.87 kg/m. If this is out of the aforementioned range listed, please consider follow up with your Primary Care Provider.  If you are age 51 or younger, your body mass index should be between 19-25. Your Body mass index is 22.87 kg/m. If this is out of the aformentioned range listed, please consider follow up with your Primary Care Provider.   ________________________________________________________  The [facility] providers would like to encourage you to use [facility] to communicate with providers for non-urgent requests or questions.  Due to long hold times on the telephone, sending your provider a message by [facility] may be a faster and more efficient way to get a response.  Please allow 48 business hours for a response.  Please remember that this is for non-urgent requests.  _______________________________________________________  We have sent the following medications to your pharmacy for you to pick up at your convenience:  Carafate  For the next 2 weeks try the Linzess '290mg'$  daily.    Take Miralax 2-3 times a day  Take Dulcolax '10mg'$  every other day or every third day  You can use Glycerin suppositories as a rescue on day 3-4  If this is not helpful, try the same thing with Trulance instead of Linzess  You have been scheduled for an abdominal ultrasound at [facility] (1st floor of hospital) on 06/27/2022 at 10:30am. Please arrive 30 minutes prior to your appointment for registration. Make certain not to have anything to eat or drink 6 hours prior to your appointment. Should you need to reschedule your appointment, please contact radiology at [phone]. This test typically takes about 30 minutes to perform.

## 2022-06-22 ENCOUNTER — Encounter: Payer: Self-pay | Admitting: Emergency Medicine

## 2022-06-22 ENCOUNTER — Ambulatory Visit (INDEPENDENT_AMBULATORY_CARE_PROVIDER_SITE_OTHER): Payer: Medicare Other | Admitting: Emergency Medicine

## 2022-06-22 DIAGNOSIS — J310 Chronic rhinitis: Secondary | ICD-10-CM

## 2022-06-22 DIAGNOSIS — R053 Chronic cough: Secondary | ICD-10-CM | POA: Diagnosis not present

## 2022-06-22 DIAGNOSIS — K219 Gastro-esophageal reflux disease without esophagitis: Secondary | ICD-10-CM | POA: Diagnosis not present

## 2022-06-22 DIAGNOSIS — R0602 Shortness of breath: Secondary | ICD-10-CM | POA: Diagnosis not present

## 2022-06-22 DIAGNOSIS — R918 Other nonspecific abnormal finding of lung field: Secondary | ICD-10-CM | POA: Diagnosis not present

## 2022-06-22 NOTE — Patient Instructions (Addendum)
We reviewed your CT scan of the chest today.  This is stable compared with your priors.  Good news. We will not need to repeat your CT unless you experience a clinical change. Continue your fluticasone nasal spray as you have been taking it. Continue loratadine as you have been taking it Follow Dr. Lamonte Sakai in 1 year If you experience any change in your breathing then please contact our office so we can see you sooner and discuss possible pulmonary function testing.

## 2022-06-22 NOTE — Assessment & Plan Note (Signed)
Continue pantoprazole. °

## 2022-06-22 NOTE — Progress Notes (Signed)
Subjective:    Patient ID: Jennifer Hull, female    DOB: [birth year], 81 y.o.   MRN: [id number]  HPI  ROV 05/13/2021  -- follow-up visit 81 year old woman with a history of rheumatoid arthritis on tocilizumab.  Also temporal arteritis, chronic cough.  She has minimal tobacco history had COVID-19 in late 2021.  I saw her in February for chronic coughing and for an abnormal CT scan of the chest with subtle micronodular disease.  Question due to her RA versus opportunistic infection.  We repeated her CT as below. I started her on loratadine and fluticasone nasal spray, saline nasal spray.  Continued pantoprazole..  Today she reports that she is feeling well. Her cough is resolved.    CT scan of the chest done on 03/15/2021 reviewed by me shows stable tiny scattered centrilobular nodules.  No new nodules or concerning findings.  Stable small prominent mediastinal hilar nodes that do not reach pathological size.  ROV 06/22/2022 --81 year old woman with rheumatoid arthritis on tocilizumab.  Also with a history of temporal arteritis and chronic cough.  She was having flaring cough following COVID-19 in late 2021, slowly improved.  She is on therapy for GERD and chronic rhinitis.  We have been following small centrilobular pulmonary nodules on serial CT scans of the chest, have been stable over time. Fluticasone nasal spray, loratadine, nasal saline, pantoprazole She has been having more problems with joint pain and swelling on tocilizumab. Minimal cough, no sputum. She hs noticed some mild exertional dyspnea that is new. No wheeze  CT chest 06/05/2022 reviewed by me shows no mediastinal or hilar adenopathy.  There are stable scattered tiny bilateral nodules that are unchanged for over 2 years.  No dedicated follow-up required..   Review of Systems As per HPI  Past Medical History:  Diagnosis Date   Anxiety    Arthritis    Chronic constipation    Depression    Hyperlipidemia    Hypothyroidism     Pneumonia    Rheumatoid arthritis ([place]) 2019   Temporal arteritis ([place])    Thyroid disease      Family History  Problem Relation Age of Onset   Kidney cancer Mother    Heart disease Father    Breast cancer Maternal Aunt    Colon cancer Neg Hx    Esophageal cancer Neg Hx    Rectal cancer Neg Hx    Stomach cancer Neg Hx    Inflammatory bowel disease Neg Hx    Liver disease Neg Hx    Pancreatic cancer Neg Hx      Social History   Socioeconomic History   Marital status: Married    Spouse name: Not on file   Number of children: Not on file   Years of education: Not on file   Highest education level: Not on file  Occupational History   Not on file  Tobacco Use   Smoking status: Former    Packs/day: 1.50    Years: 8.00    Total pack years: 12.00    Types: Cigarettes    Quit date: 22    Years since quitting: 45.7   Smokeless tobacco: Never   Tobacco comments:    smoked infrequently when she was young; quit 81 yo   Vaping Use   Vaping Use: Never used  Substance and Sexual Activity   Alcohol use: No   Drug use: No   Sexual activity: Not Currently    Partners: Male    Birth  control/protection: Surgical  Other Topics Concern   Not on file  Social History Narrative   Not on file   Social Determinants of Health   Financial Resource Strain: Low Risk  (04/04/2022)   Overall Financial Resource Strain (CARDIA)    Difficulty of Paying Living Expenses: Not hard at all  Food Insecurity: No Food Insecurity (04/04/2022)   Hunger Vital Sign    Worried About Running Out of Food in the Last Year: Never true    Ran Out of Food in the Last Year: Never true  Transportation Needs: No Transportation Needs (04/04/2022)   PRAPARE - Hydrologist (Medical): No    Lack of Transportation (Non-Medical): No  Physical Activity: Inactive (04/04/2022)   Exercise Vital Sign    Days of Exercise per Week: 0 days    Minutes of Exercise per Session: 0 min  Stress: No  Stress Concern Present (04/04/2022)   [place]    Feeling of Stress : Not at all  Social Connections: Not on file  Intimate Partner Violence: Not on file    From [place], has also lived in [place], [place], [place] No known inhaled exposures.   Allergies  Allergen Reactions   Tdap [Tetanus-Diphth-Acell Pertussis] Other (See Comments)    SEVERE ALLERGIC REACTIONS (NECK STIFFNESS/HIGH FEVER/ETC)   Tetanus Toxoid, Adsorbed     Neck stiffness, and high fever Other reaction(s): Other (See Comments) Neck stiffness, and high fever   Hydroxychloroquine Sulfate Rash   Covid-19 (Mrna) Vaccine Therapist, music) [Covid-19 (Mrna) Vaccine] Rash     Outpatient Medications Prior to Visit  Medication Sig Dispense Refill   acetaminophen (TYLENOL) 500 MG tablet Take 1,000 mg by mouth every 8 (eight) hours as needed for moderate pain.     atorvastatin (LIPITOR) 20 MG tablet TAKE 1 TABLET BY MOUTH EVERY DAY 90 tablet 3   bisacodyl (DULCOLAX) 10 MG suppository Every 2 days as needed. 30 suppository 1   Calcium Carb-Cholecalciferol (CALCIUM 600+D3 PO) Take 1 tablet by mouth daily with lunch.     cholecalciferol (VITAMIN D3) 25 MCG (1000 UNIT) tablet Take 2,000 Units by mouth daily.     fluticasone (FLONASE) 50 MCG/ACT nasal spray Place 2 sprays into both nostrils at bedtime.     folic acid (FOLVITE) 924 MCG tablet Take 800 mcg by mouth daily.     gabapentin (NEURONTIN) 300 MG capsule TAKE 1 CAPSULE BY MOUTH THREE TIMES DAILY 90 capsule 5   levothyroxine (EUTHYROX) 112 MCG tablet Take 1 tablet (112 mcg total) by mouth daily before breakfast. 90 tablet 2   loratadine (CLARITIN) 10 MG tablet Take 1 tablet (10 mg total) by mouth daily. 30 tablet 11   magnesium oxide (MAG-OX) 400 MG tablet Take 400 mg by mouth 2 (two) times daily.     Melatonin 10 MG CAPS Take 10 mg by mouth at bedtime.     pantoprazole (PROTONIX) 40 MG tablet Take 1 tablet (40 mg total) by mouth  daily. 90 tablet 1   sertraline (ZOLOFT) 100 MG tablet TAKE 1 AND 1/2 TABLETS BY MOUTH ONCE DAILY 135 tablet 2   sucralfate (CARAFATE) 1 g tablet Take 1 tablet (1 g total) by mouth in the morning and at bedtime. (Patient taking differently: Take 2 g by mouth in the morning and at bedtime.) 60 tablet 6   temazepam (RESTORIL) 15 MG capsule TAKE 1 CAPSULE BY MOUTH AT BEDTIME AS NEEDED SLEEP 30 capsule 3  Tocilizumab (ACTEMRA IV) Inject into the vein every 30 (thirty) days.     TRULANCE 3 MG TABS Take 1 tablet by mouth daily. 30 tablet 6   TURMERIC CURCUMIN PO Take 3,000 mg by mouth daily.     zinc gluconate 50 MG tablet Take 50 mg by mouth daily.     Omega-3 Fatty Acids (FISH OIL) 1000 MG CAPS Take 1,000 mg by mouth daily.     No facility-administered medications prior to visit.        Objective:   Physical Exam Vitals:   06/22/22 0915  BP: 126/72  Pulse: 78  Temp: 97.8 F (36.6 C)  TempSrc: Oral  SpO2: 96%  Weight: 129 lb (58.5 kg)  Height: '5\' 3"'$  (1.6 m)   Gen: Pleasant, well-nourished, in no distress,  normal affect  ENT: No lesions,  mouth clear,  oropharynx clear, no postnasal drip  Neck: No JVD, no stridor  Lungs: No use of accessory muscles, no crackles or wheezing on normal respiration, no wheeze on forced expiration  Cardiovascular: RRR, heart sounds normal, no murmur or gallops, no peripheral edema  Musculoskeletal: No deformities, no cyanosis or clubbing  Neuro: alert, awake, non focal  Skin: Warm, no lesions or rash     Assessment & Plan:  Pulmonary nodules Micronodular disease consistent with the patient's rheumatoid arthritis.  Stable for 2 years while she has been on tocilizumab.  Little to support opportunistic infection.  Her cough is improved.  She should not need any dedicated CT follow-up unless there is a clinical change.  Chronic rhinitis Continue loratadine, fluticasone nasal spray  GERD (gastroesophageal reflux disease) Continue  pantoprazole  Chronic cough Improved with treatment of her GERD, rhinitis  Shortness of breath She has been noticing some mild exertional dyspnea over the last few months.  We discussed possibly performing pulmonary function testing to evaluate further.  She wants to hold off for now, see if this progresses.  If so then we will follow-up and perform PFT  Baltazar Apo, MD, PhD 06/22/2022, 9:40 AM [facility] [phone] or if no answer before 7:00PM call [phone] For any issues after 7:00PM please call eLink [phone]

## 2022-06-22 NOTE — Assessment & Plan Note (Signed)
Micronodular disease consistent with the patient's rheumatoid arthritis.  Stable for 2 years while she has been on tocilizumab.  Little to support opportunistic infection.  Her cough is improved.  She should not need any dedicated CT follow-up unless there is a clinical change.

## 2022-06-22 NOTE — Assessment & Plan Note (Signed)
Continue loratadine, fluticasone nasal spray 

## 2022-06-22 NOTE — Assessment & Plan Note (Signed)
She has been noticing some mild exertional dyspnea over the last few months.  We discussed possibly performing pulmonary function testing to evaluate further.  She wants to hold off for now, see if this progresses.  If so then we will follow-up and perform PFT

## 2022-06-22 NOTE — Assessment & Plan Note (Signed)
Improved with treatment of her GERD, rhinitis

## 2022-06-24 ENCOUNTER — Encounter: Payer: Self-pay | Admitting: Gastroenterology

## 2022-06-24 DIAGNOSIS — K581 Irritable bowel syndrome with constipation: Secondary | ICD-10-CM | POA: Insufficient documentation

## 2022-06-24 DIAGNOSIS — R1013 Epigastric pain: Secondary | ICD-10-CM | POA: Insufficient documentation

## 2022-06-24 DIAGNOSIS — K2970 Gastritis, unspecified, without bleeding: Secondary | ICD-10-CM | POA: Insufficient documentation

## 2022-06-27 ENCOUNTER — Encounter: Payer: Self-pay | Admitting: Gastroenterology

## 2022-06-27 ENCOUNTER — Ambulatory Visit (HOSPITAL_COMMUNITY)
Admission: RE | Admit: 2022-06-27 | Discharge: 2022-06-27 | Disposition: A | Discharge status: Home / Self Care | Payer: Medicare Other | Source: Ambulatory Visit | Attending: Gastroenterology | Admitting: Gastroenterology

## 2022-06-27 DIAGNOSIS — K5904 Chronic idiopathic constipation: Secondary | ICD-10-CM | POA: Insufficient documentation

## 2022-06-27 DIAGNOSIS — K2960 Other gastritis without bleeding: Secondary | ICD-10-CM | POA: Diagnosis not present

## 2022-06-27 DIAGNOSIS — R1013 Epigastric pain: Secondary | ICD-10-CM | POA: Insufficient documentation

## 2022-06-27 DIAGNOSIS — K581 Irritable bowel syndrome with constipation: Secondary | ICD-10-CM | POA: Insufficient documentation

## 2022-06-27 DIAGNOSIS — K760 Fatty (change of) liver, not elsewhere classified: Secondary | ICD-10-CM | POA: Diagnosis not present

## 2022-06-30 ENCOUNTER — Other Ambulatory Visit: Payer: Self-pay | Admitting: Family Medicine

## 2022-06-30 DIAGNOSIS — E039 Hypothyroidism, unspecified: Secondary | ICD-10-CM

## 2022-07-05 ENCOUNTER — Encounter: Payer: Self-pay | Admitting: Gastroenterology

## 2022-07-05 ENCOUNTER — Other Ambulatory Visit: Payer: Self-pay

## 2022-07-05 MED ORDER — LINACLOTIDE 290 MCG PO CAPS
290.0000 ug | ORAL_CAPSULE | Freq: Every day | ORAL | 6 refills | Status: AC
Start: 2022-07-05 — End: ?

## 2022-07-06 DIAGNOSIS — M0579 Rheumatoid arthritis with rheumatoid factor of multiple sites without organ or systems involvement: Secondary | ICD-10-CM | POA: Diagnosis not present

## 2022-07-25 ENCOUNTER — Telehealth: Payer: Self-pay

## 2022-07-25 NOTE — Telephone Encounter (Signed)
New enrollment and prescription form for Linzess has been completed and faxed back to [employer] assist at [phone].

## 2022-07-31 ENCOUNTER — Ambulatory Visit (INDEPENDENT_AMBULATORY_CARE_PROVIDER_SITE_OTHER): Payer: Medicare Other | Admitting: *Deleted

## 2022-07-31 DIAGNOSIS — M816 Localized osteoporosis [Lequesne]: Secondary | ICD-10-CM | POA: Diagnosis not present

## 2022-07-31 MED ORDER — DENOSUMAB 60 MG/ML ~~LOC~~ SOSY
60.0000 mg | PREFILLED_SYRINGE | Freq: Once | SUBCUTANEOUS | Status: AC
  Administered 2022-07-31: 60 mg via SUBCUTANEOUS

## 2022-07-31 NOTE — Progress Notes (Signed)
Per orders of Dr. Martinique, injection of Prolia given by Westley Hummer. Patient tolerated injection well.

## 2022-08-07 DIAGNOSIS — M7989 Other specified soft tissue disorders: Secondary | ICD-10-CM | POA: Diagnosis not present

## 2022-08-07 DIAGNOSIS — M0579 Rheumatoid arthritis with rheumatoid factor of multiple sites without organ or systems involvement: Secondary | ICD-10-CM | POA: Diagnosis not present

## 2022-08-15 ENCOUNTER — Other Ambulatory Visit: Payer: Self-pay | Admitting: Family Medicine

## 2022-08-15 DIAGNOSIS — Z1231 Encounter for screening mammogram for malignant neoplasm of breast: Secondary | ICD-10-CM

## 2022-08-16 DIAGNOSIS — M4310 Spondylolisthesis, site unspecified: Secondary | ICD-10-CM | POA: Diagnosis not present

## 2022-08-21 DIAGNOSIS — M19072 Primary osteoarthritis, left ankle and foot: Secondary | ICD-10-CM | POA: Diagnosis not present

## 2022-08-25 ENCOUNTER — Ambulatory Visit (INDEPENDENT_AMBULATORY_CARE_PROVIDER_SITE_OTHER): Payer: Medicare Other | Admitting: Family Medicine

## 2022-08-25 ENCOUNTER — Encounter: Payer: Self-pay | Admitting: Family Medicine

## 2022-08-25 VITALS — BP 118/70 | HR 78 | Temp 98.3°F | Resp 12 | Ht 63.0 in | Wt 126.1 lb

## 2022-08-25 DIAGNOSIS — R102 Pelvic and perineal pain: Secondary | ICD-10-CM | POA: Diagnosis not present

## 2022-08-25 DIAGNOSIS — N390 Urinary tract infection, site not specified: Secondary | ICD-10-CM

## 2022-08-25 DIAGNOSIS — R350 Frequency of micturition: Secondary | ICD-10-CM | POA: Diagnosis not present

## 2022-08-25 DIAGNOSIS — K5900 Constipation, unspecified: Secondary | ICD-10-CM | POA: Diagnosis not present

## 2022-08-25 LAB — POCT URINALYSIS DIPSTICK
Bilirubin, UA: NEGATIVE
Blood, UA: POSITIVE
Glucose, UA: NEGATIVE
Ketones, UA: NEGATIVE
Nitrite, UA: NEGATIVE
Protein, UA: NEGATIVE
Spec Grav, UA: 1.01 (ref 1.010–1.025)
Urobilinogen, UA: 0.2 E.U./dL
pH, UA: 6.5 (ref 5.0–8.0)

## 2022-08-25 MED ORDER — SULFAMETHOXAZOLE-TRIMETHOPRIM 800-160 MG PO TABS: 1.0000 | ORAL_TABLET | Freq: Two times a day (BID) | ORAL | 0 refills | Status: AC

## 2022-08-25 NOTE — Progress Notes (Signed)
ACUTE VISIT Chief Complaint  Patient presents with   Urinary Tract Infection   HPI: [name] is a 81 y.o. female with history of GERD, IBS, anxiety, peripheral neuropathy, hypothyroidism, and RA here today complaining of intermittent suprapubic abdominal discomfort, which is a new symptom.  Pain is not radiated. She has noticed an increase in urinary frequency and urgency, feeling as if her bladder is still full even after urinating. No other associated urinary symptoms.  Urinary Frequency  This is a new problem. The current episode started in the past 7 days. The problem has been unchanged. The pain is mild. There has been no fever. She is Not sexually active. There is No history of pyelonephritis. Associated symptoms include frequency, hesitancy and urgency. Pertinent negatives include no chills, discharge, flank pain, hematuria, nausea or vomiting. She has tried increased fluids for the symptoms.  Reporting history of pyelonephritis when she was an infant.  Constipation has not been well controlled.  She reports having bowel movements that are sometimes "terrible", with a recent change from Trulance, which was helping but high cost, to Linzess 290 mcg daily, also taking MiraLAX 2-3 times per day and bisacodyl. OTC Ibgard sometimes helps.  Small bowel movements a few times throughout the day, occasionally she also has diarrhea. She has not noted blood in the stool or melena.  Follows with gastroenterologist.  Review of Systems  Constitutional:  Negative for activity change, appetite change and chills.  Respiratory:  Negative for shortness of breath.   Cardiovascular:  Negative for chest pain and leg swelling.  Gastrointestinal:  Negative for nausea and vomiting.  Genitourinary:  Positive for frequency, hesitancy and urgency. Negative for flank pain and hematuria.  Musculoskeletal:  Positive for arthralgias (chronic).  Skin:  Negative for rash.  Neurological:  Negative  for syncope and weakness.  See other pertinent positives and negatives in HPI.  Current Outpatient Medications on File Prior to Visit  Medication Sig Dispense Refill   acetaminophen (TYLENOL) 500 MG tablet Take 1,000 mg by mouth every 8 (eight) hours as needed for moderate pain.     atorvastatin (LIPITOR) 20 MG tablet TAKE 1 TABLET BY MOUTH EVERY DAY 90 tablet 3   bisacodyl (DULCOLAX) 10 MG suppository Every 2 days as needed. 30 suppository 1   Calcium Carb-Cholecalciferol (CALCIUM 600+D3 PO) Take 1 tablet by mouth daily with lunch.     cholecalciferol (VITAMIN D3) 25 MCG (1000 UNIT) tablet Take 2,000 Units by mouth daily.     fluticasone (FLONASE) 50 MCG/ACT nasal spray Place 2 sprays into both nostrils at bedtime.     folic acid (FOLVITE) 585 MCG tablet Take 800 mcg by mouth daily.     gabapentin (NEURONTIN) 300 MG capsule TAKE 1 CAPSULE BY MOUTH THREE TIMES DAILY 90 capsule 5   levothyroxine (SYNTHROID) 112 MCG tablet TAKE 1 TABLET(112 MCG) BY MOUTH DAILY BEFORE BREAKFAST 90 tablet 2   linaclotide (LINZESS) 290 MCG CAPS capsule Take 1 capsule (290 mcg total) by mouth daily before breakfast. 30 capsule 6   loratadine (CLARITIN) 10 MG tablet Take 1 tablet (10 mg total) by mouth daily. 30 tablet 11   magnesium oxide (MAG-OX) 400 MG tablet Take 400 mg by mouth 2 (two) times daily.     Melatonin 10 MG CAPS Take 10 mg by mouth at bedtime.     Omega-3 Fatty Acids (FISH OIL) 1000 MG CAPS Take 1,000 mg by mouth daily.     pantoprazole (PROTONIX) 40 MG tablet Take  1 tablet (40 mg total) by mouth daily. 90 tablet 1   sertraline (ZOLOFT) 100 MG tablet TAKE 1 AND 1/2 TABLETS BY MOUTH ONCE DAILY 135 tablet 2   sucralfate (CARAFATE) 1 g tablet Take 1 tablet (1 g total) by mouth in the morning and at bedtime. (Patient taking differently: Take 2 g by mouth in the morning and at bedtime.) 60 tablet 6   temazepam (RESTORIL) 15 MG capsule TAKE 1 CAPSULE BY MOUTH AT BEDTIME AS NEEDED SLEEP 30 capsule 3    Tocilizumab (ACTEMRA IV) Inject into the vein every 30 (thirty) days.     TURMERIC CURCUMIN PO Take 3,000 mg by mouth daily.     zinc gluconate 50 MG tablet Take 50 mg by mouth daily.     No current facility-administered medications on file prior to visit.    Past Medical History:  Diagnosis Date   Anxiety    Arthritis    Chronic constipation    Depression    Hyperlipidemia    Hypothyroidism    Pneumonia    Rheumatoid arthritis ([place]) 2019   Temporal arteritis ([facility])    Thyroid disease    Allergies  Allergen Reactions   Tdap [Tetanus-Diphth-Acell Pertussis] Other (See Comments)    SEVERE ALLERGIC REACTIONS (NECK STIFFNESS/HIGH FEVER/ETC)   Tetanus Toxoid, Adsorbed     Neck stiffness, and high fever Other reaction(s): Other (See Comments) Neck stiffness, and high fever   Hydroxychloroquine Sulfate Rash   Covid-19 (Mrna) Vaccine Therapist, music) [Covid-19 (Mrna) Vaccine] Rash    Social History   Socioeconomic History   Marital status: Married    Spouse name: Not on file   Number of children: Not on file   Years of education: Not on file   Highest education level: Not on file  Occupational History   Not on file  Tobacco Use   Smoking status: Former    Packs/day: 1.50    Years: 8.00    Total pack years: 12.00    Types: Cigarettes    Quit date: 44    Years since quitting: 45.9   Smokeless tobacco: Never   Tobacco comments:    smoked infrequently when she was young; quit 81 yo   Vaping Use   Vaping Use: Never used  Substance and Sexual Activity   Alcohol use: No   Drug use: No   Sexual activity: Not Currently    Partners: Male    Birth control/protection: Surgical  Other Topics Concern   Not on file  Social History Narrative   Not on file   Social Determinants of Health   Financial Resource Strain: Low Risk  (04/04/2022)   Overall Financial Resource Strain (CARDIA)    Difficulty of Paying Living Expenses: Not hard at all  Food Insecurity: No Food Insecurity  (04/04/2022)   Hunger Vital Sign    Worried About Running Out of Food in the Last Year: Never true    [place]in[place] in the Last Year: Never true  Transportation Needs: No Transportation Needs (04/04/2022)   PRAPARE - Hydrologist (Medical): No    Lack of Transportation (Non-Medical): No  Physical Activity: Inactive (04/04/2022)   Exercise Vital Sign    Days of Exercise per Week: 0 days    Minutes of Exercise per Session: 0 min  Stress: No Stress Concern Present (04/04/2022)   [place]    Feeling of Stress : Not at all  Social Connections: Not on file   Vitals:   08/25/22 1152  BP: 118/70  Pulse: 78  Resp: 12  Temp: 98.3 F (36.8 C)  SpO2: 98%  Body mass index is 22.34 kg/m.  Physical Exam Vitals and nursing note reviewed.  Constitutional:      General: She is not in acute distress.    Appearance: She is well-developed and normal weight.  HENT:     Head: Normocephalic and atraumatic.  Eyes:     Conjunctiva/sclera: Conjunctivae normal.  Cardiovascular:     Rate and Rhythm: Normal rate and regular rhythm.  Pulmonary:     Effort: Pulmonary effort is normal. No respiratory distress.     Breath sounds: Normal breath sounds.  Abdominal:     Palpations: Abdomen is soft. There is no mass.     Tenderness: There is no abdominal tenderness. There is no right CVA tenderness or left CVA tenderness.  Lymphadenopathy:     Cervical: No cervical adenopathy.  Skin:    General: Skin is warm.     Findings: No erythema.  Neurological:     General: No focal deficit present.     Mental Status: She is alert and oriented to person, place, and time.  Psychiatric:        Mood and Affect: Affect normal. Mood is anxious.   ASSESSMENT AND PLAN:  Orders Placed This Encounter  Procedures   Culture, Urine   POCT urinalysis dipstick   Suprapubic pain, acute We discussed possible etiologies,  constipation can be an aggravating factor. Pain was not elicited during examination. Monitor for new symptoms. Instructed about warning signs.  Urinary tract infection without hematuria, site unspecified Empiric treatment with Bactrim DS twice daily x5 days. Continue adequate hydration. Will tailor treatment if needed according to urine culture and sensitivity report. Clearly instructed about warning signs.  -     Sulfamethoxazole-Trimethoprim; Take 1 tablet by mouth 2 (two) times daily for 5 days.  Dispense: 10 tablet; Refill: 0  Urinary frequency We discussed possible etiologies,?  Overactive bladder. Urine dipstick here in the office with mild abnormalities, trace leukocytes and positive for blood. We will follow urine culture.  Constipation, unspecified constipation type Assessment & Plan: Problem is not well controlled. Continue Linzess 290 mcg daily, MiraLAX, bisacodyl. Continue adequate fluid and fiber intake. Following with gastroenterologist.  Return if symptoms worsen or fail to improve, for keep next appointment.  Terris Germano G. Martinique, MD  [facility]. [place] office.

## 2022-08-25 NOTE — Assessment & Plan Note (Signed)
Problem is not well controlled. Continue Linzess 290 mcg daily, MiraLAX, bisacodyl. Continue adequate fluid and fiber intake. Following with gastroenterologist.

## 2022-08-25 NOTE — Patient Instructions (Addendum)
A few things to remember from today's visit:  Suprapubic pain, acute - Plan: POCT urinalysis dipstick, Culture, Urine  Urinary tract infection without hematuria, site unspecified - Plan: sulfamethoxazole-trimethoprim (BACTRIM DS) 800-160 MG tablet  Urinary frequency  Bactrim para empezar hoy. Dependiendo del cultivo de orine voy a hacer mas recomendaciones.  If you need refills for medications you take chronically, please call your pharmacy. Do not use My Chart to request refills or for acute issues that need immediate attention. If you send a my chart message, it may take a few days to be addressed, specially if I am not in the office.  Please be sure medication list is accurate. If a new problem present, please set up appointment sooner than planned today.

## 2022-08-26 LAB — URINE CULTURE
MICRO NUMBER:: 14205329
Result:: NO GROWTH
SPECIMEN QUALITY:: ADEQUATE

## 2022-08-30 ENCOUNTER — Other Ambulatory Visit: Payer: Self-pay | Admitting: Family Medicine

## 2022-08-30 DIAGNOSIS — G4700 Insomnia, unspecified: Secondary | ICD-10-CM

## 2022-08-30 DIAGNOSIS — F331 Major depressive disorder, recurrent, moderate: Secondary | ICD-10-CM

## 2022-08-30 DIAGNOSIS — F411 Generalized anxiety disorder: Secondary | ICD-10-CM

## 2022-09-06 ENCOUNTER — Encounter: Payer: Self-pay | Admitting: Family Medicine

## 2022-09-07 ENCOUNTER — Encounter: Payer: Self-pay | Admitting: Adult Health

## 2022-09-07 ENCOUNTER — Ambulatory Visit (INDEPENDENT_AMBULATORY_CARE_PROVIDER_SITE_OTHER): Payer: Medicare Other | Admitting: Adult Health

## 2022-09-07 VITALS — BP 130/80 | HR 67 | Temp 97.6°F | Ht 63.0 in | Wt 127.0 lb

## 2022-09-07 DIAGNOSIS — L239 Allergic contact dermatitis, unspecified cause: Secondary | ICD-10-CM

## 2022-09-07 MED ORDER — METHYLPREDNISOLONE 4 MG PO TBPK: ORAL_TABLET | ORAL | 0 refills | Status: DC

## 2022-09-07 MED ORDER — HYDROXYZINE PAMOATE 25 MG PO CAPS: 25.0000 mg | ORAL_CAPSULE | Freq: Three times a day (TID) | ORAL | 0 refills | Status: DC | PRN

## 2022-09-07 NOTE — Progress Notes (Signed)
Subjective:    Patient ID: Jennifer Hull, female    DOB: [birth year], 81 y.o.   MRN: [id number]  HPI  81 year old female who  has a past medical history of Anxiety, Arthritis, Chronic constipation, Depression, Hyperlipidemia, Hypothyroidism, Pneumonia, Rheumatoid arthritis ([place]) (2019), Temporal arteritis ([place]), and Thyroid disease.  She presents to the office today for an acute issue.  Her symptoms started roughly 2 days ago.  She reports a rash on her back, bilateral flanks, upper chest and abdomen.  This rash is itchy and has been keeping her awake at night.  At home she has used Benadryl and calamine lotion without relief.  She denies changes in lotions, detergents, soaps, or diet.  She has had a similar rash in the past which had to be treated with steroids and hydroxyzine.  Review of Systems See HPI   Past Medical History:  Diagnosis Date   Anxiety    Arthritis    Chronic constipation    Depression    Hyperlipidemia    Hypothyroidism    Pneumonia    Rheumatoid arthritis ([place]) 2019   Temporal arteritis ([place])    Thyroid disease     Social History   Socioeconomic History   Marital status: Married    Spouse name: Not on file   Number of children: Not on file   Years of education: Not on file   Highest education level: Not on file  Occupational History   Not on file  Tobacco Use   Smoking status: Former    Packs/day: 1.50    Years: 8.00    Total pack years: 12.00    Types: Cigarettes    Quit date: 68    Years since quitting: 45.9   Smokeless tobacco: Never   Tobacco comments:    smoked infrequently when she was young; quit 81 yo   Vaping Use   Vaping Use: Never used  Substance and Sexual Activity   Alcohol use: No   Drug use: No   Sexual activity: Not Currently    Partners: Male    Birth control/protection: Surgical  Other Topics Concern   Not on file  Social History Narrative   Not on file   Social Determinants of Health   Financial Resource  Strain: Low Risk  (04/04/2022)   Overall Financial Resource Strain (CARDIA)    Difficulty of Paying Living Expenses: Not hard at all  Food Insecurity: No Food Insecurity (04/04/2022)   Hunger Vital Sign    Worried About Running Out of Food in the Last Year: Never true    Ran Out of Food in the Last Year: Never true  Transportation Needs: No Transportation Needs (04/04/2022)   PRAPARE - Hydrologist (Medical): No    Lack of Transportation (Non-Medical): No  Physical Activity: Inactive (04/04/2022)   Exercise Vital Sign    Days of Exercise per Week: 0 days    Minutes of Exercise per Session: 0 min  Stress: No Stress Concern Present (04/04/2022)   [place]    Feeling of Stress : Not at all  Social Connections: Not on file  Intimate Partner Violence: Not on file    Past Surgical History:  Procedure Laterality Date   ABDOMINAL HYSTERECTOMY     BREAST EXCISIONAL BIOPSY Left    BREAST SURGERY     biopsy   COLONOSCOPY     REVERSE SHOULDER ARTHROPLASTY Left 06/09/2021  Procedure: REVERSE SHOULDER ARTHROPLASTY;  Surgeon: Tania Ade, MD;  Location: [facility];  Service: Orthopedics;  Laterality: Left;   SHOULDER SURGERY Left 06/09/2021   TEMPORAL ARTERY BIOPSY / LIGATION Bilateral 2014   TONSILLECTOMY     TOTAL SHOULDER ARTHROPLASTY Right 11/22/2017   Procedure: RIGHT REVERSE TOTAL SHOULDER ARTHROPLASTY;  Surgeon: Tania Ade, MD;  Location: [place];  Service: Orthopedics;  Laterality: Right;    Family History  Problem Relation Age of Onset   Kidney cancer Mother    Heart disease Father    Breast cancer Maternal Aunt    Colon cancer Neg Hx    Esophageal cancer Neg Hx    Rectal cancer Neg Hx    Stomach cancer Neg Hx    Inflammatory bowel disease Neg Hx    Liver disease Neg Hx    Pancreatic cancer Neg Hx     Allergies  Allergen Reactions   Tdap [Tetanus-Diphth-Acell Pertussis]  Other (See Comments)    SEVERE ALLERGIC REACTIONS (NECK STIFFNESS/HIGH FEVER/ETC)   Tetanus Toxoid, Adsorbed     Neck stiffness, and high fever Other reaction(s): Other (See Comments) Neck stiffness, and high fever   Hydroxychloroquine Sulfate Rash   Covid-19 (Mrna) Vaccine Therapist, music) [Covid-19 (Mrna) Vaccine] Rash    Current Outpatient Medications on File Prior to Visit  Medication Sig Dispense Refill   acetaminophen (TYLENOL) 500 MG tablet Take 1,000 mg by mouth every 8 (eight) hours as needed for moderate pain.     atorvastatin (LIPITOR) 20 MG tablet TAKE 1 TABLET BY MOUTH EVERY DAY 90 tablet 3   bisacodyl (DULCOLAX) 10 MG suppository Every 2 days as needed. 30 suppository 1   Calcium Carb-Cholecalciferol (CALCIUM 600+D3 PO) Take 1 tablet by mouth daily with lunch.     cholecalciferol (VITAMIN D3) 25 MCG (1000 UNIT) tablet Take 2,000 Units by mouth daily.     fluticasone (FLONASE) 50 MCG/ACT nasal spray Place 2 sprays into both nostrils at bedtime.     folic acid (FOLVITE) 703 MCG tablet Take 800 mcg by mouth daily.     gabapentin (NEURONTIN) 300 MG capsule TAKE 1 CAPSULE BY MOUTH THREE TIMES DAILY 90 capsule 5   levothyroxine (SYNTHROID) 112 MCG tablet TAKE 1 TABLET(112 MCG) BY MOUTH DAILY BEFORE BREAKFAST 90 tablet 2   linaclotide (LINZESS) 290 MCG CAPS capsule Take 1 capsule (290 mcg total) by mouth daily before breakfast. 30 capsule 6   loratadine (CLARITIN) 10 MG tablet Take 1 tablet (10 mg total) by mouth daily. 30 tablet 11   magnesium oxide (MAG-OX) 400 MG tablet Take 400 mg by mouth 2 (two) times daily.     Melatonin 10 MG CAPS Take 10 mg by mouth at bedtime.     Omega-3 Fatty Acids (FISH OIL) 1000 MG CAPS Take 1,000 mg by mouth daily.     pantoprazole (PROTONIX) 40 MG tablet Take 1 tablet (40 mg total) by mouth daily. 90 tablet 1   sertraline (ZOLOFT) 100 MG tablet TAKE 1 AND 1/2 TABLETS BY MOUTH EVERY DAY 135 tablet 2   sucralfate (CARAFATE) 1 g tablet Take 1 tablet (1 g  total) by mouth in the morning and at bedtime. (Patient taking differently: Take 2 g by mouth in the morning and at bedtime.) 60 tablet 6   temazepam (RESTORIL) 15 MG capsule TAKE 1 CAPSULE BY MOUTH AT BEDTIME AS NEEDED FOR SLEEP 30 capsule 3   Tocilizumab (ACTEMRA IV) Inject into the vein every 30 (thirty) days.     TURMERIC CURCUMIN  PO Take 3,000 mg by mouth daily.     zinc gluconate 50 MG tablet Take 50 mg by mouth daily.     No current facility-administered medications on file prior to visit.    BP 130/80   Pulse 67   Temp 97.6 F (36.4 C) (Oral)   Ht '5\' 3"'$  (1.6 m)   Wt 127 lb (57.6 kg)   SpO2 100%   BMI 22.50 kg/m       Objective:   Physical Exam Vitals and nursing note reviewed.  Constitutional:      Appearance: Normal appearance.  Cardiovascular:     Rate and Rhythm: Normal rate and regular rhythm.     Pulses: Normal pulses.     Heart sounds: Normal heart sounds.  Pulmonary:     Effort: Pulmonary effort is normal.     Breath sounds: Normal breath sounds.  Skin:    General: Skin is warm and dry.     Findings: Rash present.     Comments: Patches of a flat red rash noted on lower back, upper chest and abdomen.   Neurological:     General: No focal deficit present.     Mental Status: She is alert and oriented to person, place, and time.  Psychiatric:        Mood and Affect: Mood normal.        Behavior: Behavior normal.        Thought Content: Thought content normal.        Judgment: Judgment normal.       Assessment & Plan:  1. Allergic contact dermatitis, unspecified trigger -  - methylPREDNISolone (MEDROL DOSEPAK) 4 MG TBPK tablet; Take as directed  Dispense: 21 tablet; Refill: 0 - hydrOXYzine (VISTARIL) 25 MG capsule; Take 1 capsule (25 mg total) by mouth every 8 (eight) hours as needed.  Dispense: 30 capsule; Refill: 0 - Follow up if not improving in the next 2-3 days  - She was advised on the potential sedating aspect of hydroxyzine   Dorothyann Peng,  NP

## 2022-09-10 ENCOUNTER — Encounter (HOSPITAL_BASED_OUTPATIENT_CLINIC_OR_DEPARTMENT_OTHER): Payer: Self-pay | Admitting: Emergency Medicine

## 2022-09-10 ENCOUNTER — Emergency Department (HOSPITAL_BASED_OUTPATIENT_CLINIC_OR_DEPARTMENT_OTHER)
Admission: EM | Admit: 2022-09-10 | Discharge: 2022-09-10 | Disposition: A | Discharge status: Home / Self Care | Payer: Medicare Other | Attending: Emergency Medicine | Admitting: Emergency Medicine

## 2022-09-10 ENCOUNTER — Other Ambulatory Visit: Payer: Self-pay

## 2022-09-10 DIAGNOSIS — R21 Rash and other nonspecific skin eruption: Secondary | ICD-10-CM | POA: Diagnosis present

## 2022-09-10 DIAGNOSIS — Z79899 Other long term (current) drug therapy: Secondary | ICD-10-CM | POA: Diagnosis not present

## 2022-09-10 DIAGNOSIS — E039 Hypothyroidism, unspecified: Secondary | ICD-10-CM | POA: Diagnosis not present

## 2022-09-10 DIAGNOSIS — L299 Pruritus, unspecified: Secondary | ICD-10-CM | POA: Diagnosis not present

## 2022-09-10 MED ORDER — DEXAMETHASONE 4 MG PO TABS
4.0000 mg | ORAL_TABLET | Freq: Once | ORAL | Status: AC
  Administered 2022-09-10: 4 mg via ORAL
  Filled 2022-09-10: qty 1

## 2022-09-10 MED ORDER — DIPHENHYDRAMINE HCL 25 MG PO CAPS
25.0000 mg | ORAL_CAPSULE | Freq: Once | ORAL | Status: AC
  Administered 2022-09-10: 25 mg via ORAL
  Filled 2022-09-10: qty 1

## 2022-09-10 NOTE — ED Notes (Signed)
Pt has been taking '50mg'$  Benadryl for several days without success. No luck with topical creams or Calamine either.

## 2022-09-10 NOTE — ED Provider Notes (Signed)
[facility] EMERGENCY DEPARTMENT Provider Note   CSN: [id number] Arrival date & time: 09/10/22  1026     History  Chief Complaint  Patient presents with   Rash    Jennifer Hull is a 81 y.o. female.  With a history of rheumatoid arthritis, anxiety, hypothyroidism, depression, temporal arteritis who presents ED for evaluation of itching.  She states that she has been itching since Tuesday of last week.  She saw her primary care provider and was prescribed a Medrol Dosepak and hydroxyzine.  States the itching improved with hydroxyzine, but returns a few hours later.  States that warm showers help with the itching but it comes back as soon as she is dry.  Had similar symptoms in the spring of this year when she got her COVID-vaccine.  Was started on steroids and had near immediate improvement in her symptoms.  Denies pain, recent illnesses, recent travel, new soaps or detergents.   Rash      Home Medications Prior to Admission medications   Medication Sig Start Date End Date Taking? Authorizing Provider  acetaminophen (TYLENOL) 500 MG tablet Take 1,000 mg by mouth every 8 (eight) hours as needed for moderate pain.    [provider]  atorvastatin (LIPITOR) 20 MG tablet TAKE 1 TABLET BY MOUTH EVERY DAY 01/16/22   Martinique, Betty G, MD  bisacodyl (DULCOLAX) 10 MG suppository Every 2 days as needed. 01/18/22   Martin[place], Betty G, MD  Calcium Carb-Cholecalciferol (CALCIUM 600+D3 PO) Take 1 tablet by mouth daily with lunch.    [provider]  cholecalciferol (VITAMIN D3) 25 MCG (1000 UNIT) tablet Take 2,000 Units by mouth daily.    [provider]  fluticasone (FLONASE) 50 MCG/ACT nasal spray Place 2 sprays into both nostrils at bedtime.    [provider]  folic acid (FOLVITE) 785 MCG tablet Take 800 mcg by mouth daily.    [provider]  gabapentin (NEURONTIN) 300 MG capsule TAKE 1 CAPSULE BY MOUTH THREE TIMES DAILY 05/08/22   Martin[place], Betty  G, MD  hydrOXYzine (VISTARIL) 25 MG capsule Take 1 capsule (25 mg total) by mouth every 8 (eight) hours as needed. 09/07/22   Nafziger, Tommi Rumps, NP  levothyroxine (SYNTHROID) 112 MCG tablet TAKE 1 TABLET(112 MCG) BY MOUTH DAILY BEFORE BREAKFAST 06/30/22   Martin[place], Betty G, MD  linaclotide [facility]) 290 MCG CAPS capsule Take 1 capsule (290 mcg total) by mouth daily before breakfast. 07/05/22   Mansouraty, Telford Nab., MD  loratadine (CLARITIN) 10 MG tablet Take 1 tablet (10 mg total) by mouth daily. 11/25/21   Collene Gobble, MD  magnesium oxide (MAG-OX) 400 MG tablet Take 400 mg by mouth 2 (two) times daily.    [provider]  Melatonin 10 MG CAPS Take 10 mg by mouth at bedtime.    [provider]  methylPREDNISolone (MEDROL DOSEPAK) 4 MG TBPK tablet Take as directed 09/07/22   Nafziger, Tommi Rumps, NP  Omega-3 Fatty Acids (FISH OIL) 1000 MG CAPS Take 1,000 mg by mouth daily.    [provider]  pantoprazole (PROTONIX) 40 MG tablet Take 1 tablet (40 mg total) by mouth daily. 06/15/22   Levin Erp, PA  sertraline (ZOLOFT) 100 MG tablet TAKE 1 AND 1/2 TABLETS BY MOUTH EVERY DAY 08/30/22   Martinique, Betty G, MD  sucralfate (CARAFATE) 1 g tablet Take 1 tablet (1 g total) by mouth in the morning and at bedtime. Patient taking differently: Take 2 g by mouth in  the morning and at bedtime. 06/21/22   Mansouraty, Telford Nab., MD  temazepam (RESTORIL) 15 MG capsule TAKE 1 CAPSULE BY MOUTH AT BEDTIME AS NEEDED FOR SLEEP 08/30/22   Martinique, Betty G, MD  Tocilizumab (ACTEMRA IV) Inject into the vein every 30 (thirty) days.    [provider]  TURMERIC CURCUMIN PO Take 3,000 mg by mouth daily.    [provider]  zinc gluconate 50 MG tablet Take 50 mg by mouth daily.    [provider]      Allergies    Tdap [tetanus-diphth-acell pertussis]; Tetanus toxoid, adsorbed; Hydroxychloroquine sulfate; and Covid-19 (mrna) vaccine (pfizer) [covid-19 (mrna) vaccine]     Review of Systems   Review of Systems  Constitutional:        Pruritus  Skin:  Positive for rash.  All other systems reviewed and are negative.   Physical Exam Updated Vital Signs BP 117/60   Pulse 68   Temp 98.1 F (36.7 C) (Oral)   Resp 18   Ht '5\' 3"'$  (1.6 m)   Wt 57.2 kg   SpO2 100%   BMI 22.32 kg/m  Physical Exam Vitals and nursing note reviewed.  Constitutional:      General: She is not in acute distress.    Appearance: Normal appearance. She is normal weight. She is not ill-appearing.  HENT:     Head: Normocephalic and atraumatic.  Eyes:     Comments: No icterus  Pulmonary:     Effort: Pulmonary effort is normal. No respiratory distress.  Abdominal:     General: Abdomen is flat.  Musculoskeletal:        General: Normal range of motion.     Cervical back: Neck supple.  Skin:    General: Skin is warm and dry.     Coloration: Skin is not jaundiced.     Findings: Abrasion present. No erythema, petechiae or rash.     Comments: Abrasions from itching herself on her low back  Neurological:     Mental Status: She is alert and oriented to person, place, and time.  Psychiatric:        Mood and Affect: Mood normal.        Behavior: Behavior normal.     ED Results / Procedures / Treatments   Labs (all labs ordered are listed, but only abnormal results are displayed) Labs Reviewed - No data to display  EKG None  Radiology No results found.  Procedures Procedures    Medications Ordered in ED Medications  diphenhydrAMINE (BENADRYL) capsule 25 mg (25 mg Oral Given 09/10/22 1306)  dexamethasone (DECADRON) tablet 4 mg (4 mg Oral Given 09/10/22 1620)    ED Course/ Medical Decision Making/ A&P Clinical Course as of 09/10/22 1714  Sun Sep 10, 2022  1340 She reports her.  This has improved with Benadryl.  Of note, each time and walking to the room she appears to be in no acute distress and then begins to itch as we continue to talk [AS]    Clinical Course  User Index [AS] Claudie Fisherman Grafton Folk, PA-C                           Medical Decision Making Risk Prescription drug management.  This patient presents to the ED for concern of pruritus, this involves an extensive number of treatment options, and is a complaint that carries with it a high risk of complications and morbidity.  The differential  diagnosis includes contact dermatitis, hyperbilirubinemia atopic dermatitis, varicella, tinea, polycythemia vera   My initial workup includes benadryl, decadron  Additional history obtained from: Nursing notes from this visit.   Afebrile, hemodynamically stable.  40-year-old female presenting to the ED for evaluation of pruritus.  Symptoms are improved with hydroxyzine and taking baths, however it returns quickly afterwards, for abrasions to lower back from her scratching herself.  I see no appreciable rash or erythema.  She was given Benadryl in the ED with improvement in her symptoms.  Of note, each time I walked into the room she was in no acute distress.  After talking with there for couple seconds she starts to itch.  This occurred on multiple occasions while in the ED.  She was given a dose of p.o. Decadron as she was told by her rheumatologist not to take prednisone.  Area of concern is too large for topical steroid therapy.  I have low suspicion for acute emergencies as a cause of her symptoms.  Itching may be secondary to dry skin as I did note a large amount of skin flakes on exam.  Patient was strongly encouraged to continue taking her hydroxyzine and to take baths with soap that includes oatmeal in it.  She was also encouraged to use lotions.  She will call tomorrow to schedule an appointment with her primary care provider.  She was given return precautions.  Stable at discharge.   At this time there does not appear to be any evidence of an acute emergency medical condition and the patient appears stable for discharge with appropriate outpatient  follow up. Diagnosis was discussed with patient who verbalizes understanding of care plan and is agreeable to discharge. I have discussed return precautions with patient and husband who verbalizes understanding. Patient encouraged to follow-up with their PCP within 1 week. All questions answered.  Patient's case discussed with Dr. Tyrone Nine who agrees with plan to discharge with follow-up.   Note: Portions of this report may have been transcribed using voice recognition software. Every effort was made to ensure accuracy; however, inadvertent computerized transcription errors may still be present.          Final Clinical Impression(s) / ED Diagnoses Final diagnoses:  Pruritus    Rx / DC Orders ED Discharge Orders     None         Roylene Reason, PA-C 09/10/22 1714    Audley Hose, MD 09/11/22 1736

## 2022-09-10 NOTE — Discharge Instructions (Signed)
You have been seen today for your complaint of itching. Your discharge medications include moisturizing lotions and oatmeal containing soaps. Follow up with: Your primary care provider early next week Please seek immediate medical care if you develop any of the following symptoms: The itching does not go away after several days. You notice redness, warmth, or drainage on the skin where you have scratched. You are unusually thirsty or urinating more than normal. Your skin tingles or feels numb. Your skin or the white parts of your eyes turn yellow (jaundice). You feel weak. You have any of the following: Night sweats. Tiredness (fatigue). Weight loss. Abdominal pain. At this time there does not appear to be the presence of an emergent medical condition, however there is always the potential for conditions to change. Please read and follow the below instructions.  Do not take your medicine if  develop an itchy rash, swelling in your mouth or lips, or difficulty breathing; call 911 and seek immediate emergency medical attention if this occurs.  You may review your lab tests and imaging results in their entirety on your MyChart account.  Please discuss all results of fully with your primary care provider and other specialist at your follow-up visit.  Note: Portions of this text may have been transcribed using voice recognition software. Every effort was made to ensure accuracy; however, inadvertent computerized transcription errors may still be present.

## 2022-09-10 NOTE — ED Notes (Signed)
Pt family expressed frustration about the extended wait. Pt was informed of several critical patients in the ED that pulled the provider's attention away. They seemed to understand after an extensive apology for the delay. EDP consulted to hopefully expedite their discharge.

## 2022-09-10 NOTE — ED Triage Notes (Signed)
Pt arrives pov, slow gait with cane, c/o itchy rash x 5 days, tx by pcp with no relief. Red rash noted to back. Denies detergent or soap changes pprior to rash

## 2022-09-11 ENCOUNTER — Encounter: Payer: Self-pay | Admitting: Family Medicine

## 2022-09-12 ENCOUNTER — Ambulatory Visit: Payer: Medicare Other | Admitting: Family Medicine

## 2022-09-12 ENCOUNTER — Encounter: Payer: Self-pay | Admitting: Family Medicine

## 2022-09-15 ENCOUNTER — Encounter: Payer: Self-pay | Admitting: Family Medicine

## 2022-09-21 DIAGNOSIS — M0579 Rheumatoid arthritis with rheumatoid factor of multiple sites without organ or systems involvement: Secondary | ICD-10-CM | POA: Diagnosis not present

## 2022-09-21 DIAGNOSIS — Z79899 Other long term (current) drug therapy: Secondary | ICD-10-CM | POA: Diagnosis not present

## 2022-09-27 DIAGNOSIS — L298 Other pruritus: Secondary | ICD-10-CM | POA: Diagnosis not present

## 2022-09-27 DIAGNOSIS — L853 Xerosis cutis: Secondary | ICD-10-CM | POA: Diagnosis not present

## 2022-09-28 ENCOUNTER — Encounter: Payer: Self-pay | Admitting: Family Medicine

## 2022-09-29 ENCOUNTER — Encounter: Payer: Self-pay | Admitting: Gastroenterology

## 2022-09-29 ENCOUNTER — Encounter: Payer: Self-pay | Admitting: Family Medicine

## 2022-09-29 DIAGNOSIS — L299 Pruritus, unspecified: Secondary | ICD-10-CM

## 2022-09-29 DIAGNOSIS — R1013 Epigastric pain: Secondary | ICD-10-CM

## 2022-10-05 NOTE — Telephone Encounter (Signed)
Jennifer Hull or covering RN, Please let the patient know we would be happy to evaluate the patient for celiac disease. Unless the patient has dermatitis herpetiformis, her likelihood to have having underlying celiac disease is low. With that being said, she certainly can be tested for celiac if she wishes. I would use itching as a diagnosis as well as previous history of abdominal pain. TTG IgA level and IgA levels should be obtained at her convenience. Thanks. GM

## 2022-10-06 DIAGNOSIS — L299 Pruritus, unspecified: Secondary | ICD-10-CM | POA: Insufficient documentation

## 2022-10-06 NOTE — Addendum Note (Signed)
Addended by: Timothy Lasso on: 10/06/2022 08:18 AM   Modules accepted: Orders

## 2022-10-11 ENCOUNTER — Other Ambulatory Visit: Payer: Medicare Other

## 2022-10-11 DIAGNOSIS — L299 Pruritus, unspecified: Secondary | ICD-10-CM | POA: Diagnosis not present

## 2022-10-12 LAB — IGA: Immunoglobulin A: 111 mg/dL (ref 70–320)

## 2022-10-12 LAB — TISSUE TRANSGLUTAMINASE, IGA: (tTG) Ab, IgA: <1 U/mL

## 2022-10-13 ENCOUNTER — Encounter: Payer: Self-pay | Admitting: Gastroenterology

## 2022-10-17 ENCOUNTER — Ambulatory Visit
Admission: RE | Admit: 2022-10-17 | Discharge: 2022-10-17 | Disposition: A | Discharge status: Home / Self Care | Payer: Medicare Other | Source: Ambulatory Visit | Attending: Family Medicine | Admitting: Family Medicine

## 2022-10-17 DIAGNOSIS — Z1231 Encounter for screening mammogram for malignant neoplasm of breast: Secondary | ICD-10-CM

## 2022-10-19 DIAGNOSIS — M0579 Rheumatoid arthritis with rheumatoid factor of multiple sites without organ or systems involvement: Secondary | ICD-10-CM | POA: Diagnosis not present

## 2022-10-20 ENCOUNTER — Encounter: Payer: Self-pay | Admitting: Adult Health

## 2022-10-20 ENCOUNTER — Ambulatory Visit (INDEPENDENT_AMBULATORY_CARE_PROVIDER_SITE_OTHER): Payer: Medicare Other | Admitting: Adult Health

## 2022-10-20 ENCOUNTER — Ambulatory Visit (INDEPENDENT_AMBULATORY_CARE_PROVIDER_SITE_OTHER)
Admission: RE | Admit: 2022-10-20 | Discharge: 2022-10-20 | Disposition: A | Discharge status: Home / Self Care | Payer: Medicare Other | Source: Ambulatory Visit | Attending: Adult Health | Admitting: Adult Health

## 2022-10-20 VITALS — BP 120/66 | HR 77 | Temp 97.9°F | Wt 127.6 lb

## 2022-10-20 DIAGNOSIS — R053 Chronic cough: Secondary | ICD-10-CM | POA: Diagnosis not present

## 2022-10-20 DIAGNOSIS — J209 Acute bronchitis, unspecified: Secondary | ICD-10-CM | POA: Diagnosis not present

## 2022-10-20 DIAGNOSIS — R059 Cough, unspecified: Secondary | ICD-10-CM | POA: Diagnosis not present

## 2022-10-20 MED ORDER — BENZONATATE 200 MG PO CAPS: 200.0000 mg | ORAL_CAPSULE | Freq: Three times a day (TID) | ORAL | 1 refills | Status: DC | PRN

## 2022-10-20 MED ORDER — PREDNISONE 20 MG PO TABS: 20.0000 mg | ORAL_TABLET | Freq: Every day | ORAL | 0 refills | Status: DC

## 2022-10-20 NOTE — Progress Notes (Signed)
$'@Patient't$  ID: Jennifer Hull, female    DOB: 05-14-1941, 82 y.o.   MRN: [id number]  Chief Complaint  Patient presents with   Acute Visit    Coughing x1 month. Nasal drainage as well. Mucinex has not helped that much. Pt states dry cough.     Referring provider: Martinique, Betty G, MD  HPI: 82 year old female followed for chronic cough and micronodular pulmonary disease Medical history significant for rheumatoid arthritis and temporal arteritis  TEST/EVENTS :  CT chest 06/05/2022 shows no mediastinal or hilar adenopathy.  There are stable scattered tiny bilateral nodules that are unchanged for over 2 years.  No dedicated follow-up required..    10/20/2022 Acute OV : Cough  Patient presents for an acute office visit.  Complains over the last 4 weeks that her cough has been flared.  She developed an initial cold-like symptoms with nasal congestion drainage and cough that has been minimally productive.  All mucus has been clear to white with no discolored mucus.  No fever. She is taking Claritin and Flonase daily.  She is also on an aggressive reflux regimen. Appetite is good with no nausea vomiting or diarrhea.   Allergies  Allergen Reactions   Tdap [Tetanus-Diphth-Acell Pertussis] Other (See Comments)    SEVERE ALLERGIC REACTIONS (NECK STIFFNESS/HIGH FEVER/ETC)   Tetanus Toxoid, Adsorbed     Neck stiffness, and high fever Other reaction(s): Other (See Comments) Neck stiffness, and high fever   Hydroxychloroquine Sulfate Rash   Covid-19 (Mrna) Vaccine Therapist, music) [Covid-19 (Mrna) Vaccine] Rash    Immunization History  Administered Date(s) Administered   Influenza, High Dose Seasonal PF 06/27/2017, 07/02/2018   PFIZER(Purple Top)SARS-COV-2 Vaccination 12/17/2019, 01/09/2020   Pneumococcal Conjugate-13 06/27/2017   Pneumococcal Polysaccharide-23 07/02/2018    Past Medical History:  Diagnosis Date   Anxiety    Arthritis    Chronic constipation    Depression    Hyperlipidemia     Hypothyroidism    Pneumonia    Rheumatoid arthritis ([place]) 2019   Temporal arteritis ([facility])    Thyroid disease     Tobacco History: Social History   Tobacco Use  Smoking Status Former   Packs/day: 1.50   Years: 8.00   Total pack years: 12.00   Types: Cigarettes   Quit date: 1978   Years since quitting: 46.0  Smokeless Tobacco Never  Tobacco Comments   smoked infrequently when she was young; quit 82 yo    Counseling given: Not Answered Tobacco comments: smoked infrequently when she was young; quit 82 yo    Outpatient Medications Prior to Visit  Medication Sig Dispense Refill   acetaminophen (TYLENOL) 500 MG tablet Take 1,000 mg by mouth every 8 (eight) hours as needed for moderate pain.     atorvastatin (LIPITOR) 20 MG tablet TAKE 1 TABLET BY MOUTH EVERY DAY 90 tablet 3   bisacodyl (DULCOLAX) 10 MG suppository Every 2 days as needed. 30 suppository 1   Calcium Carb-Cholecalciferol (CALCIUM 600+D3 PO) Take 1 tablet by mouth daily with lunch.     cholecalciferol (VITAMIN D3) 25 MCG (1000 UNIT) tablet Take 4,000 Units by mouth daily.     fluticasone (FLONASE) 50 MCG/ACT nasal spray Place 2 sprays into both nostrils at bedtime.     folic acid (FOLVITE) 952 MCG tablet Take 800 mcg by mouth daily.     gabapentin (NEURONTIN) 300 MG capsule TAKE 1 CAPSULE BY MOUTH THREE TIMES DAILY 90 capsule 5   hydrOXYzine (VISTARIL) 25 MG capsule Take 1 capsule (25  mg total) by mouth every 8 (eight) hours as needed. 30 capsule 0   levothyroxine (SYNTHROID) 112 MCG tablet TAKE 1 TABLET(112 MCG) BY MOUTH DAILY BEFORE BREAKFAST 90 tablet 2   linaclotide (LINZESS) 290 MCG CAPS capsule Take 1 capsule (290 mcg total) by mouth daily before breakfast. 30 capsule 6   loratadine (CLARITIN) 10 MG tablet Take 1 tablet (10 mg total) by mouth daily. 30 tablet 11   magnesium oxide (MAG-OX) 400 MG tablet Take 400 mg by mouth 2 (two) times daily.     Melatonin 10 MG CAPS Take 10 mg by mouth at bedtime.      Omega-3 Fatty Acids (FISH OIL) 1000 MG CAPS Take 1,000 mg by mouth daily.     pantoprazole (PROTONIX) 40 MG tablet Take 1 tablet (40 mg total) by mouth daily. 90 tablet 1   sertraline (ZOLOFT) 100 MG tablet TAKE 1 AND 1/2 TABLETS BY MOUTH EVERY DAY 135 tablet 2   sucralfate (CARAFATE) 1 g tablet Take 1 tablet (1 g total) by mouth in the morning and at bedtime. (Patient taking differently: Take 2 g by mouth in the morning and at bedtime.) 60 tablet 6   temazepam (RESTORIL) 15 MG capsule TAKE 1 CAPSULE BY MOUTH AT BEDTIME AS NEEDED FOR SLEEP 30 capsule 3   Tocilizumab (ACTEMRA IV) Inject into the vein every 30 (thirty) days.     TURMERIC CURCUMIN PO Take 3,000 mg by mouth daily.     zinc gluconate 50 MG tablet Take 50 mg by mouth daily.     methylPREDNISolone (MEDROL DOSEPAK) 4 MG TBPK tablet Take as directed 21 tablet 0   No facility-administered medications prior to visit.     Review of Systems:   Constitutional:   No  weight loss, night sweats,  Fevers, chills, fatigue, or  lassitude.  HEENT:   No headaches,  Difficulty swallowing,  Tooth/dental problems, or  Sore throat,                No sneezing, itching, ear ache, + nasal congestion, post nasal drip,   CV:  No chest pain,  Orthopnea, PND, swelling in lower extremities, anasarca, dizziness, palpitations, syncope.   GI  No heartburn, indigestion, abdominal pain, nausea, vomiting, diarrhea, change in bowel habits, loss of appetite, bloody stools.   Resp:  No chest wall deformity  Skin: no rash or lesions.  GU: no dysuria, change in color of urine, no urgency or frequency.  No flank pain, no hematuria   MS:  No joint pain or swelling.  No decreased range of motion.  No back pain.    Physical Exam  BP 120/66 (BP Location: Left Arm, Patient Position: Sitting, Cuff Size: Normal)   Pulse 77   Temp 97.9 F (36.6 C) (Oral)   Wt 127 lb 9.6 oz (57.9 kg)   SpO2 98%   BMI 22.60 kg/m   GEN: A/Ox3; pleasant , NAD, well nourished     HEENT:  [place]/AT,   NOSE-clear, THROAT-clear, no lesions, no postnasal drip or exudate noted.   NECK:  Supple w/ fair ROM; no JVD; normal carotid impulses w/o bruits; no thyromegaly or nodules palpated; no lymphadenopathy.    RESP  Clear  P & A; w/o, wheezes/ rales/ or rhonchi. no accessory muscle use, no dullness to percussion  CARD:  RRR, no m/r/g, no peripheral edema, pulses intact, no cyanosis or clubbing.  GI:   Soft & nt; nml bowel sounds; no organomegaly or masses detected.  Musco: Warm bil, no deformities or joint swelling noted.   Neuro: alert, no focal deficits noted.    Skin: Warm, no lesions or rashes    Lab Results:      BNP No results found for: "BNP"  ProBNP   Imaging:       No data to display          No results found for: "NITRICOXIDE"      Assessment & Plan:   Acute bronchitis Acute bronchitis slow to resolve suspect is more viral in nature.  Will check chest x-ray consider adding antibiotics if indicated.  Will treat with a short course of prednisone.  Control for triggers such as postnasal drainage and reflux.  Continue on Claritin, Flonase and PPI therapy.  Add in Delsym and Tessalon  Plan  Patient Instructions  Chest xray today.  Prednisone '20mg'$  daily for 5 days , take with food.  Delsym 2 tsp Twice daily  As needed  cough /congestion.  Tessalon Three times a day  As needed  cough  Follow up with Dr. Lamonte Sakai  as planned and As needed   Please contact office for sooner follow up if symptoms do not improve or worsen or seek emergency care        Rexene Edison, NP 10/20/2022

## 2022-10-20 NOTE — Assessment & Plan Note (Signed)
Acute bronchitis slow to resolve suspect is more viral in nature.  Will check chest x-ray consider adding antibiotics if indicated.  Will treat with a short course of prednisone.  Control for triggers such as postnasal drainage and reflux.  Continue on Claritin, Flonase and PPI therapy.  Add in Delsym and Tessalon  Plan  Patient Instructions  Chest xray today.  Prednisone '20mg'$  daily for 5 days , take with food.  Delsym 2 tsp Twice daily  As needed  cough /congestion.  Tessalon Three times a day  As needed  cough  Follow up with Dr. Lamonte Sakai  as planned and As needed   Please contact office for sooner follow up if symptoms do not improve or worsen or seek emergency care

## 2022-10-20 NOTE — Patient Instructions (Addendum)
Chest xray today.  Prednisone '20mg'$  daily for 5 days , take with food.  Delsym 2 tsp Twice daily  As needed  cough /congestion.  Tessalon Three times a day  As needed  cough  Follow up with Dr. Lamonte Sakai  as planned and As needed   Please contact office for sooner follow up if symptoms do not improve or worsen or seek emergency care

## 2022-10-23 ENCOUNTER — Other Ambulatory Visit (HOSPITAL_COMMUNITY): Payer: Self-pay

## 2022-10-23 ENCOUNTER — Telehealth: Payer: Self-pay

## 2022-10-23 NOTE — Telephone Encounter (Signed)
-----  Message from Melvenia Needles, NP sent at 10/20/2022  4:44 PM EST ----- Chest x-ray shows no sign of pneumonia.  Continue with office visit recommendations and follow-up Please contact office for sooner follow up if symptoms do not improve or worsen or seek emergency care  Called, no vm .

## 2022-10-23 NOTE — Telephone Encounter (Signed)
Called Pt to give results of chest xray. Pt stated understanding and nothing further needed at this time.

## 2022-10-24 ENCOUNTER — Telehealth: Payer: Self-pay

## 2022-10-24 ENCOUNTER — Other Ambulatory Visit (HOSPITAL_COMMUNITY): Payer: Self-pay

## 2022-10-24 NOTE — Telephone Encounter (Signed)
Patient Advocate Encounter   Received notification from [facility] that prior authorization for Temazepam 15 mg is required.   PA submitted on 10/24/2022 Key [id number] Status is pending

## 2022-10-26 NOTE — Telephone Encounter (Signed)
Pharmacy Patient Advocate Encounter  Prior Authorization for Temazepam '15MG'$  has been approved.    PA# [id number] Effective dates: 10/24/2022 through Until further notice

## 2022-10-30 ENCOUNTER — Encounter: Payer: Self-pay | Admitting: Family Medicine

## 2022-11-21 DIAGNOSIS — M0579 Rheumatoid arthritis with rheumatoid factor of multiple sites without organ or systems involvement: Secondary | ICD-10-CM | POA: Diagnosis not present

## 2022-11-27 ENCOUNTER — Other Ambulatory Visit: Payer: Self-pay | Admitting: Gastroenterology

## 2022-11-27 MED ORDER — PANTOPRAZOLE SODIUM 40 MG PO TBEC: 40.0000 mg | DELAYED_RELEASE_TABLET | Freq: Every day | ORAL | 1 refills | Status: DC

## 2022-12-12 DIAGNOSIS — Z79899 Other long term (current) drug therapy: Secondary | ICD-10-CM | POA: Diagnosis not present

## 2022-12-12 DIAGNOSIS — M1991 Primary osteoarthritis, unspecified site: Secondary | ICD-10-CM | POA: Diagnosis not present

## 2022-12-12 DIAGNOSIS — M5136 Other intervertebral disc degeneration, lumbar region: Secondary | ICD-10-CM | POA: Diagnosis not present

## 2022-12-12 DIAGNOSIS — R5383 Other fatigue: Secondary | ICD-10-CM | POA: Diagnosis not present

## 2022-12-12 DIAGNOSIS — M316 Other giant cell arteritis: Secondary | ICD-10-CM | POA: Diagnosis not present

## 2022-12-12 DIAGNOSIS — Z6822 Body mass index (BMI) 22.0-22.9, adult: Secondary | ICD-10-CM | POA: Diagnosis not present

## 2022-12-12 DIAGNOSIS — M8589 Other specified disorders of bone density and structure, multiple sites: Secondary | ICD-10-CM | POA: Diagnosis not present

## 2022-12-12 DIAGNOSIS — M0589 Other rheumatoid arthritis with rheumatoid factor of multiple sites: Secondary | ICD-10-CM | POA: Diagnosis not present

## 2022-12-19 DIAGNOSIS — R5383 Other fatigue: Secondary | ICD-10-CM | POA: Diagnosis not present

## 2022-12-19 DIAGNOSIS — Z111 Encounter for screening for respiratory tuberculosis: Secondary | ICD-10-CM | POA: Diagnosis not present

## 2022-12-19 DIAGNOSIS — M0579 Rheumatoid arthritis with rheumatoid factor of multiple sites without organ or systems involvement: Secondary | ICD-10-CM | POA: Diagnosis not present

## 2022-12-20 LAB — LAB REPORT - SCANNED: EGFR: 76

## 2022-12-21 DIAGNOSIS — M1711 Unilateral primary osteoarthritis, right knee: Secondary | ICD-10-CM | POA: Diagnosis not present

## 2022-12-21 DIAGNOSIS — M1712 Unilateral primary osteoarthritis, left knee: Secondary | ICD-10-CM | POA: Diagnosis not present

## 2022-12-24 ENCOUNTER — Other Ambulatory Visit: Payer: Self-pay | Admitting: Emergency Medicine

## 2022-12-24 ENCOUNTER — Other Ambulatory Visit: Payer: Self-pay | Admitting: Family Medicine

## 2022-12-24 DIAGNOSIS — E785 Hyperlipidemia, unspecified: Secondary | ICD-10-CM

## 2023-01-18 DIAGNOSIS — M0579 Rheumatoid arthritis with rheumatoid factor of multiple sites without organ or systems involvement: Secondary | ICD-10-CM | POA: Diagnosis not present

## 2023-01-22 ENCOUNTER — Telehealth: Payer: Self-pay | Admitting: Family Medicine

## 2023-01-22 NOTE — Telephone Encounter (Signed)
$  0, schedule after 4/23

## 2023-01-22 NOTE — Telephone Encounter (Signed)
Pt is calling to sch her prolia injection. Please confirm

## 2023-01-23 NOTE — Telephone Encounter (Signed)
Lmom for pt to call back. 

## 2023-01-26 NOTE — Telephone Encounter (Signed)
Lmom for pt to call back. 

## 2023-01-26 NOTE — Telephone Encounter (Signed)
Pt has been sch for 01-31-2023

## 2023-01-31 ENCOUNTER — Ambulatory Visit (INDEPENDENT_AMBULATORY_CARE_PROVIDER_SITE_OTHER): Payer: Medicare Other

## 2023-01-31 DIAGNOSIS — M816 Localized osteoporosis [Lequesne]: Secondary | ICD-10-CM | POA: Diagnosis not present

## 2023-01-31 MED ORDER — DENOSUMAB 60 MG/ML ~~LOC~~ SOSY
60.0000 mg | PREFILLED_SYRINGE | Freq: Once | SUBCUTANEOUS | Status: AC
  Administered 2023-01-31: 60 mg via SUBCUTANEOUS

## 2023-01-31 NOTE — Progress Notes (Signed)
Per orders of Dr. Jordan, injection of Denosumab 60 mg/ml  given by Tamsen Reist L Carmen Tolliver. Patient tolerated injection well.  

## 2023-02-15 DIAGNOSIS — M0579 Rheumatoid arthritis with rheumatoid factor of multiple sites without organ or systems involvement: Secondary | ICD-10-CM | POA: Diagnosis not present

## 2023-02-20 NOTE — Progress Notes (Unsigned)
[name] [address] [place] [address] Phone: [phone] Subjective:   INadine Counts, am serving as a scribe for Dr. Antoine Primas.  I'm seeing this patient by the request  of:  [place], Betty G, MD  CC: hand pain and numbness   [id number]  Jennifer Hull is a 82 y.o. female coming in with complaint of hand pain and numbness. Numbness is both hands while doing activities and pain in both scapula. Was uses braces at night because numbness was during sleep. Has both shoulder replaced. Tylenol and meloxicam to help with pain. Been hurting for months.      Past Medical History:  Diagnosis Date   Anxiety    Arthritis    Chronic constipation    Depression    Hyperlipidemia    Hypothyroidism    Pneumonia    Rheumatoid arthritis (HCC) 2019   Temporal arteritis (HCC)    Thyroid disease    Past Surgical History:  Procedure Laterality Date   ABDOMINAL HYSTERECTOMY     BREAST EXCISIONAL BIOPSY Left    BREAST SURGERY     biopsy   COLONOSCOPY     JOINT REPLACEMENT     REVERSE SHOULDER ARTHROPLASTY Left 06/09/2021   Procedure: REVERSE SHOULDER ARTHROPLASTY;  Surgeon: Jones Broom, MD;  Location: [facility];  Service: Orthopedics;  Laterality: Left;   SHOULDER SURGERY Left 06/09/2021   TEMPORAL ARTERY BIOPSY / LIGATION Bilateral 2014   TONSILLECTOMY     TOTAL SHOULDER ARTHROPLASTY Right 11/22/2017   Procedure: RIGHT REVERSE TOTAL SHOULDER ARTHROPLASTY;  Surgeon: Jones Broom, MD;  Location: [facility];  Service: Orthopedics;  Laterality: Right;   Social History   Socioeconomic History   Marital status: Married    Spouse name: Not on file   Number of children: Not on file   Years of education: Not on file   Highest education level: Not on file  Occupational History   Not on file  Tobacco Use   Smoking status: Former    Packs/day: 1.50    Years: 8.00    Additional pack years: 0.00    Total pack years: 12.00    Types:  Cigarettes    Quit date: 36    Years since quitting: 46.4   Smokeless tobacco: Never   Tobacco comments:    smoked infrequently when she was young; quit 82 yo   Vaping Use   Vaping Use: Never used  Substance and Sexual Activity   Alcohol use: No   Drug use: No   Sexual activity: Not Currently    Partners: Male    Birth control/protection: Surgical  Other Topics Concern   Not on file  Social History Narrative   Not on file   Social Determinants of Health   Financial Resource Strain: Low Risk  (04/04/2022)   Overall Financial Resource Strain (CARDIA)    Difficulty of Paying Living Expenses: Not hard at all  Food Insecurity: No Food Insecurity (04/04/2022)   Hunger Vital Sign    Worried About Running Out of Food in the Last Year: Never true    Ran Out of Food in the Last Year: Never true  Transportation Needs: No Transportation Needs (04/04/2022)   PRAPARE - Administrator, Civil Service (Medical): No    Lack of Transportation (Non-Medical): No  Physical Activity: Inactive (04/04/2022)   Exercise Vital Sign    Days of Exercise per Week: 0 days    Minutes of Exercise per  Session: 0 min  Stress: No Stress Concern Present (04/04/2022)   [employer] of Occupational Health - Occupational Stress Questionnaire    Feeling of Stress : Not at all  Social Connections: Not on file   Allergies  Allergen Reactions   Tdap [Tetanus-Diphth-Acell Pertussis] Other (See Comments)    SEVERE ALLERGIC REACTIONS (NECK STIFFNESS/HIGH FEVER/ETC)   Tetanus Toxoid, Adsorbed     Neck stiffness, and high fever Other reaction(s): Other (See Comments) Neck stiffness, and high fever   Hydroxychloroquine Sulfate Rash   Covid-19 (Mrna) Vaccine Proofreader) [Covid-19 (Mrna) Vaccine] Rash   Family History  Problem Relation Age of Onset   Kidney cancer Mother    Heart disease Father    Breast cancer Maternal Aunt    Colon cancer Neg Hx    Esophageal cancer Neg Hx    Rectal cancer Neg Hx     Stomach cancer Neg Hx    Inflammatory bowel disease Neg Hx    Liver disease Neg Hx    Pancreatic cancer Neg Hx     Current Outpatient Medications (Endocrine & Metabolic):    levothyroxine (SYNTHROID) 112 MCG tablet, TAKE 1 TABLET(112 MCG) BY MOUTH DAILY BEFORE BREAKFAST   predniSONE (DELTASONE) 20 MG tablet, Take 1 tablet (20 mg total) by mouth daily with breakfast.  Current Outpatient Medications (Cardiovascular):    atorvastatin (LIPITOR) 20 MG tablet, TAKE 1 TABLET BY MOUTH EVERY DAY  Current Outpatient Medications (Respiratory):    benzonatate (TESSALON) 200 MG capsule, Take 1 capsule (200 mg total) by mouth 3 (three) times daily as needed.   fluticasone (FLONASE) 50 MCG/ACT nasal spray, Place 2 sprays into both nostrils at bedtime.   loratadine (CLARITIN) 10 MG tablet, TAKE 1 TABLET(10 MG) BY MOUTH DAILY  Current Outpatient Medications (Analgesics):    acetaminophen (TYLENOL) 500 MG tablet, Take 1,000 mg by mouth every 8 (eight) hours as needed for moderate pain.   Tocilizumab (ACTEMRA IV), Inject into the vein every 30 (thirty) days.  Current Outpatient Medications (Hematological):    folic acid (FOLVITE) 800 MCG tablet, Take 800 mcg by mouth daily.  Current Outpatient Medications (Other):    bisacodyl (DULCOLAX) 10 MG suppository, Every 2 days as needed.   Calcium Carb-Cholecalciferol (CALCIUM 600+D3 PO), Take 1 tablet by mouth daily with lunch.   cholecalciferol (VITAMIN D3) 25 MCG (1000 UNIT) tablet, Take 4,000 Units by mouth daily.   gabapentin (NEURONTIN) 300 MG capsule, TAKE 1 CAPSULE BY MOUTH THREE TIMES DAILY   hydrOXYzine (VISTARIL) 25 MG capsule, Take 1 capsule (25 mg total) by mouth every 8 (eight) hours as needed.   linaclotide (LINZESS) 290 MCG CAPS capsule, Take 1 capsule (290 mcg total) by mouth daily before breakfast.   magnesium oxide (MAG-OX) 400 MG tablet, Take 400 mg by mouth 2 (two) times daily.   Melatonin 10 MG CAPS, Take 10 mg by mouth at bedtime.    Omega-3 Fatty Acids (FISH OIL) 1000 MG CAPS, Take 1,000 mg by mouth daily.   pantoprazole (PROTONIX) 40 MG tablet, Take 1 tablet (40 mg total) by mouth daily.   sertraline (ZOLOFT) 100 MG tablet, TAKE 1 AND 1/2 TABLETS BY MOUTH EVERY DAY   sucralfate (CARAFATE) 1 g tablet, Take 1 tablet (1 g total) by mouth in the morning and at bedtime. (Patient taking differently: Take 2 g by mouth in the morning and at bedtime.)   temazepam (RESTORIL) 15 MG capsule, TAKE 1 CAPSULE BY MOUTH AT BEDTIME AS NEEDED FOR SLEEP   TURMERIC CURCUMIN  PO, Take 3,000 mg by mouth daily.   zinc gluconate 50 MG tablet, Take 50 mg by mouth daily.   Reviewed prior external information including notes and imaging from  primary care provider As well as notes that were available from care everywhere and other healthcare systems.  Past medical history, social, surgical and family history all reviewed in electronic medical record.  No pertanent information unless stated regarding to the chief complaint.   Review of Systems:  No headache, visual changes, nausea, vomiting, diarrhea, constipation, dizziness, abdominal pain, skin rash, fevers, chills, night sweats, weight loss, swollen lymph nodes, body aches, joint swelling, chest pain, shortness of breath, mood changes. POSITIVE muscle aches  Objective  Blood pressure 126/62, pulse 81, height 5\' 3"  (1.6 m), weight 128 lb (58.1 kg), SpO2 97 %.   General: No apparent distress alert and oriented x3 mood and affect normal, dressed appropriately.  HEENT: Pupils equal, extraocular movements intact  Respiratory: Patient's speak in full sentences and does not appear short of breath  Cardiovascular: No lower extremity edema, non tender, no erythema  Does have scapular tightness noted patient does have a weakness noted of the shoulder blades bilaterally.  Postsurgical changes noted as well of the shoulders.  Bilateral hands do have a positive Tinel's over the median nerves  bilaterally.  Procedure: Real-time Ultrasound Guided Injection of right carpal tunnel Device: GE Logiq Q7  Ultrasound guided injection is preferred based studies that show increased duration, increased effect, greater accuracy, decreased procedural pain, increased response rate with ultrasound guided versus blind injection.  Verbal informed consent obtained.  Time-out conducted.  Noted no overlying erythema, induration, or other signs of local infection.  Skin prepped in a sterile fashion.  Local anesthesia: Topical Ethyl chloride.  With sterile technique and under real time ultrasound guidance:  median nerve visualized.  23g 5/8 inch needle inserted distal to proximal approach into nerve sheath. Pictures taken nfor needle placement. Patient did have injection of  1 cc of 0.5% Marcaine, and 1 cc of Kenalog 40 mg/dL. Completed without difficulty  Pain immediately resolved suggesting accurate placement of the medication.  Advised to call if fevers/chills, erythema, induration, drainage, or persistent bleeding.  Impression: Technically successful ultrasound guided injection.  Procedure: Real-time Ultrasound Guided Injection of the left carpal tunnel Device: GE Logiq Q7 Ultrasound guided injection is preferred based studies that show increased duration, increased effect, greater accuracy, decreased procedural pain, increased response rate with ultrasound guided versus blind injection.  Verbal informed consent obtained.  Time-out conducted.  Noted no overlying erythema, induration, or other signs of local infection.  Skin prepped in a sterile fashion.  Local anesthesia: Topical Ethyl chloride.  With sterile technique and under real time ultrasound guidance:  median nerve visualized.  23g 5/8 inch needle inserted distal to proximal approach into nerve sheath. Pictures taken nfor needle placement. Patient did have injection of 0.5 cc of 0.5% Marcaine, and 1 cc of Kenalog 40 mg/dL. Completed without  difficulty  Pain immediately resolved suggesting accurate placement of the medication.  Advised to call if fevers/chills, erythema, induration, drainage, or persistent bleeding.  Impression: Technically successful ultrasound guided injection.    Impression and Recommendations:    The above documentation has been reviewed and is accurate and complete Judi Saa, DO

## 2023-02-21 ENCOUNTER — Encounter: Payer: Self-pay | Admitting: Family Medicine

## 2023-02-21 ENCOUNTER — Other Ambulatory Visit: Payer: Self-pay

## 2023-02-21 ENCOUNTER — Ambulatory Visit (INDEPENDENT_AMBULATORY_CARE_PROVIDER_SITE_OTHER): Payer: Medicare Other | Admitting: Family Medicine

## 2023-02-21 ENCOUNTER — Ambulatory Visit (INDEPENDENT_AMBULATORY_CARE_PROVIDER_SITE_OTHER): Payer: Medicare Other

## 2023-02-21 VITALS — BP 126/62 | HR 81 | Ht 63.0 in | Wt 128.0 lb

## 2023-02-21 DIAGNOSIS — G5603 Carpal tunnel syndrome, bilateral upper limbs: Secondary | ICD-10-CM

## 2023-02-21 DIAGNOSIS — M549 Dorsalgia, unspecified: Secondary | ICD-10-CM | POA: Diagnosis not present

## 2023-02-21 DIAGNOSIS — R200 Anesthesia of skin: Secondary | ICD-10-CM

## 2023-02-21 DIAGNOSIS — M546 Pain in thoracic spine: Secondary | ICD-10-CM | POA: Diagnosis not present

## 2023-02-21 DIAGNOSIS — Z96611 Presence of right artificial shoulder joint: Secondary | ICD-10-CM

## 2023-02-21 DIAGNOSIS — M542 Cervicalgia: Secondary | ICD-10-CM | POA: Diagnosis not present

## 2023-02-21 MED ORDER — CYANOCOBALAMIN 1000 MCG/ML IJ SOLN
1000.0000 ug | Freq: Once | INTRAMUSCULAR | Status: AC
  Administered 2023-02-21: 1000 ug via INTRAMUSCULAR

## 2023-02-21 NOTE — Assessment & Plan Note (Signed)
Bilateral injection given today and tolerated the procedure well.  Discussed icing regimen and home exercises, discussed which activities to do and which ones to avoid.  Increase activity slowly over the course of next several weeks.  Discussed icing regimen and home exercises.  Follow-up with me again in 6 to 8 weeks.  If having difficulty we will consider the possibility of nerve conduction study.

## 2023-02-21 NOTE — Assessment & Plan Note (Signed)
Has had bilateral arthroplasties of the shoulders done.  Do have weakness noted.  Will refer the patient to physical therapy to see if we can do some strengthening conditioning.

## 2023-02-21 NOTE — Progress Notes (Unsigned)
HPI: [name] is a 82 y.o. female with PMHx significant for GERD, IBS, anxiety, peripheral neuropathy, depression, hypothyroidism, and RA  here today for chronic disease management.  Last seen on 08/25/22 for acute visit.  Since her last visit she has seen her rheumatologist, Dr. Dierdre Forth and his pulmonologist for cough, which has resolved. . She also saw Dr. Katrinka Blazing for bilateral hand numbness, shoulder and back pain. 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 She received steroid injections which provided relief. She mentions that she can no longer knit, a pastime she enjoyed, due to pain in her fingers.  She is currently on atorvastatin 20 mg daily for hyperlipidemia. She had lipid panel at Dr Shawnee Knapp office. 12/19/22 she had CBC, CMP, CRP, and lipid panel done. TC 142, TG 153, HDL 61, and LDL 55.  Hypothyroidism: She is on levothyroxine 112 mg. Lab Results  Component Value Date   TSH 1.36 01/19/2022   Anxiety, insomnia, and depression: She is on sertraline 150 mg daily and temazepam 15 mg as needed for sleep and anxiety.  She describes her sleep as generally good, supplementing with 10 mg of melatonin and occasionally using temazepam.     02/23/2023    3:06 PM 08/25/2022   12:05 PM 04/04/2022    3:16 PM 03/13/2022    9:58 AM 01/18/2022    4:15 PM  Depression screen PHQ 2/9  Decreased Interest 0 0 0 0 0  Down, Depressed, Hopeless 0 0 0 0 0  PHQ - 2 Score 0 0 0 0 0  Altered sleeping 1 0  1 2  Tired, decreased energy 2 0  2 0  Change in appetite 0 0  0 0  Feeling bad or failure about yourself  0 0  0 0  Trouble concentrating 1 0  2 2  Moving slowly or fidgety/restless 0 0  2 0  Suicidal thoughts 0 0  0 0  PHQ-9 Score 4 0  7 4  Difficult doing work/chores Somewhat difficult   Not difficult at all Not difficult at all      02/23/2023    3:06 PM 01/24/2021    3:48 PM 01/30/2020    5:04 PM   GAD 7 : Generalized Anxiety Score  Nervous, Anxious, on Edge 0 0 1  Control/stop worrying 0 1 0  Worry too much - different things 0 1 0  Trouble relaxing 0 1 0  Restless 0 0 0  Easily annoyed or irritable 0 0 0  Afraid - awful might happen 0 1 0  Total GAD 7 Score 0 4 1  Anxiety Difficulty Not difficult at all Somewhat difficult Not difficult at all   Reports that she is still dealing with constipation. She is on Linzess 290 mcg daily as needed and takes Miralax 2 times per day, and bisacodyl suppositories and oral. She notes that she has not seen her gastroenterologist since last summer. She feels like her hemorrhoids are causing problems, mild dysphasia, no hematochezia. She has hx of internal and external hemorrhoids and planning on arranging an appt with her GI. She takes Carafate daily as needed and Nexium 40 mg daily. She is experiencing significant bloating. Additionally, she takes an over-the-counter supplement, Ayvigar, before lunch to aid with intestinal and stomach issues.  Review of Systems  Constitutional:  Positive for fatigue. Negative for chills and fever.  HENT:  Negative for sore throat and trouble  swallowing.   Respiratory:  Negative for shortness of breath and wheezing.   Cardiovascular:  Negative for chest pain and palpitations.  Gastrointestinal:  Negative for nausea and vomiting.  Endocrine: Negative for cold intolerance and heat intolerance.  Genitourinary:  Negative for decreased urine volume, dysuria and hematuria.  Musculoskeletal:  Positive for arthralgias, back pain and gait problem.  Skin:  Negative for rash.  Neurological:  Negative for syncope and weakness.  See other pertinent positives and negatives in HPI.  Current Outpatient Medications on File Prior to Visit  Medication Sig Dispense Refill   acetaminophen (TYLENOL) 500 MG tablet Take 1,000 mg by mouth every 8 (eight) hours as needed for moderate pain.     bisacodyl (DULCOLAX) 10 MG suppository  Every 2 days as needed. 30 suppository 1   Calcium Carb-Cholecalciferol (CALCIUM 600+D3 PO) Take 1 tablet by mouth daily with lunch.     cholecalciferol (VITAMIN D3) 25 MCG (1000 UNIT) tablet Take 4,000 Units by mouth daily.     fluticasone (FLONASE) 50 MCG/ACT nasal spray Place 2 sprays into both nostrils at bedtime.     folic acid (FOLVITE) 800 MCG tablet Take 800 mcg by mouth daily.     levothyroxine (SYNTHROID) 112 MCG tablet TAKE 1 TABLET(112 MCG) BY MOUTH DAILY BEFORE BREAKFAST 90 tablet 2   linaclotide (LINZESS) 290 MCG CAPS capsule Take 1 capsule (290 mcg total) by mouth daily before breakfast. 30 capsule 6   loratadine (CLARITIN) 10 MG tablet TAKE 1 TABLET(10 MG) BY MOUTH DAILY 30 tablet 11   magnesium oxide (MAG-OX) 400 MG tablet Take 400 mg by mouth 2 (two) times daily.     Melatonin 10 MG CAPS Take 10 mg by mouth at bedtime.     pantoprazole (PROTONIX) 40 MG tablet Take 1 tablet (40 mg total) by mouth daily. 90 tablet 1   sertraline (ZOLOFT) 100 MG tablet TAKE 1 AND 1/2 TABLETS BY MOUTH EVERY DAY 135 tablet 2   sucralfate (CARAFATE) 1 g tablet Take 1 tablet (1 g total) by mouth in the morning and at bedtime. (Patient taking differently: Take 2 g by mouth in the morning and at bedtime.) 60 tablet 6   sucralfate (CARAFATE) 1 GM/10ML suspension Take 1 g by mouth daily as needed.     temazepam (RESTORIL) 15 MG capsule TAKE 1 CAPSULE BY MOUTH AT BEDTIME AS NEEDED FOR SLEEP 30 capsule 3   Tocilizumab (ACTEMRA IV) Inject into the vein every 30 (thirty) days.     TURMERIC CURCUMIN PO Take 3,000 mg by mouth daily.     zinc gluconate 50 MG tablet Take 50 mg by mouth daily.     No current facility-administered medications on file prior to visit.   Past Medical History:  Diagnosis Date   Anxiety    Arthritis    Chronic constipation    Depression    Hyperlipidemia    Hypothyroidism    Pneumonia    Rheumatoid arthritis (HCC) 2019   Temporal arteritis (HCC)    Thyroid disease     Allergies  Allergen Reactions   Tdap [Tetanus-Diphth-Acell Pertussis] Other (See Comments)    SEVERE ALLERGIC REACTIONS (NECK STIFFNESS/HIGH FEVER/ETC)   Tetanus Toxoid, Adsorbed     Neck stiffness, and high fever Other reaction(s): Other (See Comments) Neck stiffness, and high fever   Hydroxychloroquine Sulfate Rash   Covid-19 (Mrna) Vaccine Proofreader) [Covid-19 (Mrna) Vaccine] Rash    Social History   Socioeconomic History   Marital status: Married  Spouse name: Not on file   Number of children: Not on file   Years of education: Not on file   Highest education level: Not on file  Occupational History   Not on file  Tobacco Use   Smoking status: Former    Packs/day: 1.50    Years: 8.00    Additional pack years: 0.00    Total pack years: 12.00    Types: Cigarettes    Quit date: 34    Years since quitting: 46.4   Smokeless tobacco: Never   Tobacco comments:    smoked infrequently when she was young; quit 82 yo   Vaping Use   Vaping Use: Never used  Substance and Sexual Activity   Alcohol use: No   Drug use: No   Sexual activity: Not Currently    Partners: Male    Birth control/protection: Surgical  Other Topics Concern   Not on file  Social History Narrative   Not on file   Social Determinants of Health   Financial Resource Strain: Low Risk  (04/04/2022)   Overall Financial Resource Strain (CARDIA)    Difficulty of Paying Living Expenses: Not hard at all  Food Insecurity: No Food Insecurity (04/04/2022)   Hunger Vital Sign    Worried About Running Out of Food in the Last Year: Never true    Ran Out of Food in the Last Year: Never true  Transportation Needs: No Transportation Needs (04/04/2022)   PRAPARE - Administrator, Civil Service (Medical): No    Lack of Transportation (Non-Medical): No  Physical Activity: Inactive (04/04/2022)   Exercise Vital Sign    Days of Exercise per Week: 0 days    Minutes of Exercise per Session: 0 min  Stress:  No Stress Concern Present (04/04/2022)   [employer] of Occupational Health - Occupational Stress Questionnaire    Feeling of Stress : Not at all  Social Connections: Not on file    Vitals:   02/23/23 1426  BP: 118/60  Pulse: 70  Resp: 16  Temp: 97.7 F (36.5 C)  SpO2: 98%   Body mass index is 22.9 kg/m.  Physical Exam Vitals and nursing note reviewed.  Constitutional:      General: She is not in acute distress.    Appearance: She is well-developed.  HENT:     Head: Normocephalic and atraumatic.     Mouth/Throat:     Mouth: Mucous membranes are moist.     Pharynx: Oropharynx is clear.  Eyes:     Conjunctiva/sclera: Conjunctivae normal.  Cardiovascular:     Rate and Rhythm: Normal rate and regular rhythm.     Pulses:          Dorsalis pedis pulses are 2+ on the right side and 2+ on the left side.     Heart sounds: No murmur heard. Pulmonary:     Effort: Pulmonary effort is normal. No respiratory distress.     Breath sounds: Normal breath sounds.  Abdominal:     Palpations: Abdomen is soft. There is no hepatomegaly or mass.     Tenderness: There is no abdominal tenderness.  Lymphadenopathy:     Cervical: No cervical adenopathy.  Skin:    General: Skin is warm.     Findings: No erythema or rash.  Neurological:     General: No focal deficit present.     Mental Status: She is alert and oriented to person, place, and time.     Cranial Nerves:  No cranial nerve deficit.     Gait: Gait normal.     Comments: Antalgic gait assisted with a cane.  Psychiatric:     Comments: Well groomed, good eye contact.   ASSESSMENT AND PLAN:  Ms.Tynesha was seen today for medical management of chronic issues.  Diagnoses and all orders for this visit:  Hypothyroidism, unspecified type  Rheumatoid arthritis involving multiple sites, unspecified whether rheumatoid factor present (HCC)  Major depressive disorder, recurrent episode, moderate (HCC)  Atherosclerosis of aorta  (HCC)  Hyperlipidemia, unspecified hyperlipidemia type -     Cancel: TSH; Future -     atorvastatin (LIPITOR) 20 MG tablet; Take 0.5 tablets (10 mg total) by mouth daily. -     TSH; Future -     TSH  Insomnia, unspecified type  Constipation, unspecified constipation type    Orders Placed This Encounter  Procedures   TSH    Atherosclerosis of aorta (HCC) Last LDL 55 in 12/2022.  Because GI issues, she agrees with decreasing dose of Atorvastatin from 20 mg to 10 mg daily and seen if some GI symptoms improve or resolved.  Insomnia Problem is well controlled. Continue Melatonin 10 mg daily 1-2 hours before bedtime and Temazepam 15 mg at bedtime prn. Continue good sleep hygiene.  Major depressive disorder, recurrent episode, moderate (HCC) Problem is stable. Continue sertraline 150 mg daily. F/U in 6 months, before if needed.  Constipation Problem is not well controlled. Advise caution with laxative use. Currently on Linzess 290 mcg daily. Continue adequate hydration and fiber intake. She is planning on arranging a f/u appt with her gastroenterologist.  Hyperlipidemia Atorvastatin dose decreased from 20 mg to 10 mg daily. Continue low-fat diet. Will plan on repeating lipid panel at next visit.  Hypothyroidism Problem has been well controlled. Last TSH 1.36 in 01/2022. Continue Euthyrox 112 mcg daily.  RA (rheumatoid arthritis) (HCC) Still c/o generalized arthralgias (?  Associated OA). Currently she is on tocilizumab IV every 30 days. Following with rheumatologist.  I spent a total of 47 minutes in both face to face and non face to face activities for this visit on the date of this encounter. During this time history was obtained and documented, examination was performed, prior labs reviewed, and assessment/plan discussed.  Return in about 6 months (around 08/26/2023) for Labs, chronic problems.  Jaionna Weisse G. [place], MD  [facility]. [place] [facility].

## 2023-02-21 NOTE — Patient Instructions (Addendum)
Nice to meet you Almyra Free at night 1200mg  PT referral Do prescribed exercises at least 3x a week Injections in wrist today B12 1000mg  daily Xrays today B12 injection today See you again in 7-8 weeks

## 2023-02-23 ENCOUNTER — Telehealth: Payer: Self-pay | Admitting: Gastroenterology

## 2023-02-23 ENCOUNTER — Encounter: Payer: Self-pay | Admitting: Family Medicine

## 2023-02-23 ENCOUNTER — Ambulatory Visit (INDEPENDENT_AMBULATORY_CARE_PROVIDER_SITE_OTHER): Payer: Medicare Other | Admitting: Family Medicine

## 2023-02-23 VITALS — BP 118/60 | HR 70 | Temp 97.7°F | Resp 16 | Ht 63.0 in | Wt 129.2 lb

## 2023-02-23 DIAGNOSIS — M069 Rheumatoid arthritis, unspecified: Secondary | ICD-10-CM

## 2023-02-23 DIAGNOSIS — E039 Hypothyroidism, unspecified: Secondary | ICD-10-CM

## 2023-02-23 DIAGNOSIS — F331 Major depressive disorder, recurrent, moderate: Secondary | ICD-10-CM

## 2023-02-23 DIAGNOSIS — E785 Hyperlipidemia, unspecified: Secondary | ICD-10-CM

## 2023-02-23 DIAGNOSIS — I700 Atherosclerosis of aorta: Secondary | ICD-10-CM

## 2023-02-23 DIAGNOSIS — K5900 Constipation, unspecified: Secondary | ICD-10-CM | POA: Diagnosis not present

## 2023-02-23 DIAGNOSIS — G4700 Insomnia, unspecified: Secondary | ICD-10-CM | POA: Diagnosis not present

## 2023-02-23 MED ORDER — ATORVASTATIN CALCIUM 20 MG PO TABS: 10.0000 mg | ORAL_TABLET | Freq: Every day | ORAL | 3 refills | Status: DC

## 2023-02-23 NOTE — Assessment & Plan Note (Addendum)
Still c/o generalized arthralgias (?  Associated OA). Currently she is on tocilizumab IV every 30 days. Following with rheumatologist.

## 2023-02-23 NOTE — Assessment & Plan Note (Addendum)
Problem is not well controlled. Advise caution with laxative use. Currently on Linzess 290 mcg daily. Continue adequate hydration and fiber intake. She is planning on arranging a f/u appt with her gastroenterologist.

## 2023-02-23 NOTE — Patient Instructions (Addendum)
A few things to remember from today's visit:  Hypothyroidism, unspecified type  Rheumatoid arthritis involving multiple sites, unspecified whether rheumatoid factor present (HCC), Chronic  Major depressive disorder, recurrent episode, moderate (HCC), Chronic  Atherosclerosis of aorta (HCC), Chronic  Hyperlipidemia, unspecified hyperlipidemia type - Plan: TSH, atorvastatin (LIPITOR) 20 MG tablet  Disminuya la dosis de Atorvastatin a 1/2 tab. El resto sin cambios.  If you need refills for medications you take chronically, please call your pharmacy. Do not use My Chart to request refills or for acute issues that need immediate attention. If you send a my chart message, it may take a few days to be addressed, specially if I am not in the office.  Please be sure medication list is accurate. If a new problem present, please set up appointment sooner than planned today.

## 2023-02-23 NOTE — Assessment & Plan Note (Addendum)
Last LDL 55 in 12/2022.  Because GI issues, she agrees with decreasing dose of Atorvastatin from 20 mg to 10 mg daily and seen if some GI symptoms improve or resolved.

## 2023-02-23 NOTE — Assessment & Plan Note (Addendum)
Problem is stable. Continue sertraline 150 mg daily. F/U in 6 months, before if needed.

## 2023-02-23 NOTE — Telephone Encounter (Signed)
Patient called stating she is experiencing constipation and severe pain from Hemorrhoids. She is requesting a call back to see what could be done before her appointment on 07/26. Please advise, thank you.

## 2023-02-23 NOTE — Assessment & Plan Note (Signed)
Problem is well controlled. Continue Melatonin 10 mg daily 1-2 hours before bedtime and Temazepam 15 mg at bedtime prn. Continue good sleep hygiene.

## 2023-02-23 NOTE — Assessment & Plan Note (Signed)
Problem has been well controlled. Last TSH 1.36 in 01/2022. Continue Euthyrox 112 mcg daily.

## 2023-02-23 NOTE — Assessment & Plan Note (Addendum)
Atorvastatin dose decreased from 20 mg to 10 mg daily. Continue low-fat diet. Will plan on repeating lipid panel at next visit.

## 2023-02-24 ENCOUNTER — Encounter: Payer: Self-pay | Admitting: Gastroenterology

## 2023-02-24 LAB — TSH: TSH: 0.781 u[IU]/mL (ref 0.450–4.500)

## 2023-02-24 MED ORDER — LEVOTHYROXINE SODIUM 112 MCG PO TABS: ORAL_TABLET | ORAL | 2 refills | Status: DC

## 2023-02-25 ENCOUNTER — Encounter: Payer: Self-pay | Admitting: Family Medicine

## 2023-02-26 ENCOUNTER — Other Ambulatory Visit: Payer: Self-pay

## 2023-02-26 MED ORDER — HYDROCORTISONE ACETATE 25 MG RE SUPP: 25.0000 mg | Freq: Every evening | RECTAL | 0 refills | Status: DC

## 2023-02-26 NOTE — Telephone Encounter (Signed)
Pt sent a message to Dr Meridee Score on My chart will respond as able.

## 2023-02-26 NOTE — Telephone Encounter (Signed)
Patty, Please find out if she is also taking Linzess that she did not describe that. I am okay for the patient to have one half prep once a month as a good cleanout for her in regards to her chronic constipation. She has failed multiple other laxatives as we have documented in the past. In regards to her hemorrhoids, I recommend Anusol suppositories to be used nightly for 1 week and then every other night until the prescription is done.  After that prescription is done she can initiate Calmol-4 OTC suppositories and use as needed with Anusol suppositories every third or fourth night if needed. Please send her information about hemorrhoidal banding. Please follow-up with her in the next 1 to 2 weeks after she completes her bowel preparation. Let her know that we will potentially also get her set up for a hemorrhoidal banding session with one of my partners if we can try to get the constipation little bit better controlled. Thanks. GM

## 2023-02-27 MED ORDER — NA SULFATE-K SULFATE-MG SULF 17.5-3.13-1.6 GM/177ML PO SOLN: 1.0000 | Freq: Once | ORAL | 0 refills | Status: AC

## 2023-02-27 NOTE — Telephone Encounter (Signed)
What prep would you like sent in for her?

## 2023-02-27 NOTE — Telephone Encounter (Signed)
PT called back with the name of the prep she used in the past. Sodium potassium and magnesium oral solution. She wants it sent to  [facility] on [address]

## 2023-02-27 NOTE — Addendum Note (Signed)
Addended by: Loretha Stapler on: 02/27/2023 01:15 PM   Modules accepted: Orders

## 2023-03-06 NOTE — Therapy (Signed)
OUTPATIENT PHYSICAL THERAPY NEURO EVALUATION   Patient Name: Jennifer Hull MRN: [id number] DOB:Feb 14, 1941, 82 y.o., female Today's Date: 03/07/2023   PCP: [place], Betty G, M   REFERRING PROVIDER: Judi Saa, DO  END OF SESSION:  PT End of Session - 03/07/23 1533     Visit Number 1    Number of Visits 13    Date for PT Re-Evaluation 04/18/23    Authorization Type Medicare/AARP    PT Start Time 1320    PT Stop Time 1402    PT Time Calculation (min) 42 min    Activity Tolerance Patient tolerated treatment well    Behavior During Therapy WFL for tasks assessed/performed             Past Medical History:  Diagnosis Date   Anxiety    Arthritis    Chronic constipation    Depression    Hyperlipidemia    Hypothyroidism    Pneumonia    Rheumatoid arthritis (HCC) 2019   Temporal arteritis (HCC)    Thyroid disease    Past Surgical History:  Procedure Laterality Date   ABDOMINAL HYSTERECTOMY     BREAST EXCISIONAL BIOPSY Left    BREAST SURGERY     biopsy   COLONOSCOPY     JOINT REPLACEMENT     REVERSE SHOULDER ARTHROPLASTY Left 06/09/2021   Procedure: REVERSE SHOULDER ARTHROPLASTY;  Surgeon: Jones Broom, MD;  Location: [facility];  Service: Orthopedics;  Laterality: Left;   SHOULDER SURGERY Left 06/09/2021   TEMPORAL ARTERY BIOPSY / LIGATION Bilateral 2014   TONSILLECTOMY     TOTAL SHOULDER ARTHROPLASTY Right 11/22/2017   Procedure: RIGHT REVERSE TOTAL SHOULDER ARTHROPLASTY;  Surgeon: Jones Broom, MD;  Location: [facility];  Service: Orthopedics;  Laterality: Right;   Patient Active Problem List   Diagnosis Date Noted   Bilateral carpal tunnel syndrome 02/21/2023   Acute bronchitis 10/20/2022   Itching 10/06/2022   Irritable bowel syndrome with constipation 06/24/2022   Gastritis without bleeding 06/24/2022   Abdominal pain, epigastric 06/24/2022   S/P reverse total shoulder arthroplasty, 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   Chronic rhinitis 05/13/2021   Chronic pain  disorder 01/24/2021   Pulmonary nodules 11/24/2020   Chronic cough 07/23/2020   Atherosclerosis of aorta (HCC) 07/23/2020   Temporal arteritis (HCC) 09/01/2019   RA (rheumatoid arthritis) (HCC) 09/01/2019   Peripheral neuropathy 07/21/2019   Vitamin D deficiency, unspecified 01/01/2019   Constipation 09/02/2018   Epistaxis, recurrent 12/25/2017   S/P reverse total shoulder arthroplasty, 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   GERD (gastroesophageal reflux disease) 12/26/2016   Generalized anxiety disorder 08/28/2016   Hypothyroidism 07/27/2016   Major depressive disorder, recurrent episode, moderate (HCC) 07/27/2016   Hyperlipidemia 07/27/2016   Insomnia 07/27/2016    ONSET DATE: several years   REFERRING DIAG: R20.0 (ICD-10-CM) - Bilateral hand numbness M54.9 (ICD-10-CM) - Back pain, unspecified back location, unspecified back pain laterality, unspecified chronicity  THERAPY DIAG:  Pain in thoracic spine  Unsteadiness on feet  Abnormal posture  Rationale for Evaluation and Treatment: Rehabilitation  SUBJECTIVE:  SUBJECTIVE STATEMENT: Patient reports that she had cortisone injections in B wrists and the N/T in hands went away. Back pain occurs with prolonged standing such as washing dishes. Reports that pain occurs between the scapulae and radiates down the waist and into the LEs (reports hx of knee pain and varicose veins which she has had for several years and feels that this contributes to LE pain.) Reports relief from compression stockings in the past but has not been able to tolerate them lately d/t legs itching. Denies N/T or B&B changes. Notes imbalance after standing up from sitting for a while.   Pt accompanied by: self  PERTINENT HISTORY: Anxiety, depression, HLD, RA, temporal arteritis, L reverse TSA  2022, R TSA 2019  PAIN:  Are you having pain? Yes: NPRS scale: 0/10 Pain location: between shoulder blades, radiating down to B LEs  Pain description: aching Aggravating factors: prolonged standing 20-30 min Relieving factors: Meloxicam, massage, laying in recliner  PRECAUTIONS: Fall  WEIGHT BEARING RESTRICTIONS: No  FALLS: Has patient fallen in last 6 months? Yes. Number of falls 1  LIVING ENVIRONMENT: Lives with: lives with their spouse Lives in: House/apartment Stairs:  chair lift to get up to 2nd floor Has following equipment at home: Shower bench, Grab bars, and quad tip cane  PLOF: Independent  PATIENT GOALS: improve back pain  OBJECTIVE:   DIAGNOSTIC FINDINGS: 02/21/23 cervical xray: Degenerative changes. No acute osseous abnormalities.  02/21/23 thoracic xray: Osteopenia.  No acute osseous abnormalities identified.  COGNITION: Overall cognitive status: Within functional limits for tasks assessed   SENSATION: Intact B to light touch  PALPATION:   soft tissue restriction and TTP in B UT, R LS, rhomboids; also tight but not tender in thoracolumbar paraspinals   POSTURE: slight R trunk lean, elevated shoulders, rounded/kyphotic posture, B toe out and fallen L arch  LOWER EXTREMITY ROM:     Active  Right Eval Left Eval  Hip flexion    Hip extension    Hip abduction    Hip adduction    Hip internal rotation    Hip external rotation    Knee flexion    Knee extension    Ankle dorsiflexion 14 11  Ankle plantarflexion    Ankle inversion    Ankle eversion     (Blank rows = not tested)  LOWER EXTREMITY MMT:    MMT (in sitting) Right Eval Left Eval  Hip flexion 4+ 4+  Hip extension    Hip abduction 4 4  Hip adduction 4 4  Hip internal rotation    Hip external rotation    Knee flexion 4 5  Knee extension 4+ 4+  Ankle dorsiflexion 5 5  Ankle plantarflexion 4+ 4+  Ankle inversion    Ankle eversion    (Blank rows = not tested)  THORACIC ROM:    Active  A/PROM  eval  Flexion WNL  Extension WNL  Right lateral flexion WNL  Left lateral flexion WNL  Right rotation 25% limited  Left rotation WNL   (Blank rows = not tested)   GAIT: Gait pattern: imbalance, lateral trunk lean, flexed trunk excessive toe out Assistive device utilized:  quad tip cane  5xSTS:  28.18 sec pushing off chair    TODAY'S TREATMENT:  DATE: 03/07/23    PATIENT EDUCATION: Education details: edu on benefits of OT if CTS returns; prognosis, POC, HEP Person educated: Patient Education method: Explanation, Demonstration, Tactile cues, Verbal cues, and Handouts Education comprehension: verbalized understanding and returned demonstration  HOME EXERCISE PROGRAM: Access Code: [email or web address] URL: [email or web address] Date: 03/07/2023 Prepared by: [facility] - Outpatient  Rehab - [facility]  Exercises - Seated Scapular Retraction  - 1 x daily - 5 x weekly - 2 sets - 10 reps - Scapular Retraction with Resistance  - 1 x daily - 5 x weekly - 2 sets - 10 reps - Shoulder extension with resistance - Neutral  - 1 x daily - 5 x weekly - 2 sets - 10 reps - Seated Thoracic Lumbar Extension  - 1 x daily - 5 x weekly - 2 sets - 10 reps   GOALS: Goals reviewed with patient? Yes  SHORT TERM GOALS: Target date: 03/28/2023  Patient to be independent with initial HEP. Baseline: HEP initiated Goal status: INITIAL    LONG TERM GOALS: Target date: 04/18/2023  Patient to be independent with advanced HEP. Baseline: Not yet initiated  Goal status: INITIAL  Patient to report 75% improvement in thoracic pain. Baseline: - Goal status: INITIAL  Patient to report and demonstrate improved head, neck, and shoulder posture at rest and with activity.  Baseline: rounded and kyphotic posture Goal status: INITIAL  Patient to report  tolerance for 30 minutes of standing without thoracic pain limiting. Baseline: Unable Goal status: INITIAL  Patient to complete TUG in <14 sec with LRAD in order to decrease risk of falls.   Baseline: NT Goal status: INITIAL  Patient to demonstrate 5xSTS test in <15 sec in order to decrease risk of falls.  Baseline: 28 sec Goal status: INITIAL  ASSESSMENT:  CLINICAL IMPRESSION:  Patient is an 82 y/o F presenting to OPPT with c/o chronic thoracic pain radiating down B LE with prolonged standing tasks. Denies N/T or B&B changes. Referral also included B hand N/T, however patient reports that this has resolved since getting cortisone injections. May benefit from OT if this symptom returns. Patient today presented with soft tissue restriction in cervical and periscapular musculature, abnormal posture, B hip weakness, gait deviations, and increased time required for transfers. Patient was educated on gentle periscapular strengthening HEP and reported understanding. Would benefit from skilled PT services 1-2 x/week for 6 weeks to address aforementioned impairments in order to optimize level of function.    OBJECTIVE IMPAIRMENTS: Abnormal gait, decreased activity tolerance, decreased balance, difficulty walking, decreased strength, increased muscle spasms, improper body mechanics, postural dysfunction, and pain.   ACTIVITY LIMITATIONS: lifting, sitting, standing, and hygiene/grooming  PARTICIPATION LIMITATIONS: meal prep, cleaning, laundry, shopping, community activity, and church  PERSONAL FACTORS: Age, Fitness, Past/current experiences, Time since onset of injury/illness/exacerbation, and 3+ comorbidities: Anxiety, depression, HLD, RA, temporal arteritis, L reverse TSA 2022, R TSA 2019  are also affecting patient's functional outcome.   REHAB POTENTIAL: Good  CLINICAL DECISION MAKING: Evolving/moderate complexity  EVALUATION COMPLEXITY: Moderate  PLAN:  PT FREQUENCY: 1-2x/week  PT  DURATION: 6 weeks  PLANNED INTERVENTIONS: Therapeutic exercises, Therapeutic activity, Neuromuscular re-education, Balance training, Gait training, Patient/Family education, Self Care, Joint mobilization, Stair training, Vestibular training, Canalith repositioning, DME instructions, Aquatic Therapy, Dry Needling, Electrical stimulation, Cryotherapy, Moist heat, Taping, Manual therapy, and Re-evaluation  PLAN FOR NEXT SESSION: TUG, review HEP, continue postural edu, progress periscapular strengthening, balance   Anette Guarneri, PT, DPT 03/07/23 4:05 PM  [facility] [address] [place], [place] [address] Phone # [phone] Fax # [phone]

## 2023-03-07 ENCOUNTER — Other Ambulatory Visit: Payer: Self-pay

## 2023-03-07 ENCOUNTER — Encounter: Payer: Self-pay | Admitting: Physical Therapy

## 2023-03-07 ENCOUNTER — Ambulatory Visit: Payer: Medicare Other | Attending: Family Medicine | Admitting: Physical Therapy

## 2023-03-07 DIAGNOSIS — M549 Dorsalgia, unspecified: Secondary | ICD-10-CM | POA: Diagnosis not present

## 2023-03-07 DIAGNOSIS — R293 Abnormal posture: Secondary | ICD-10-CM | POA: Diagnosis not present

## 2023-03-07 DIAGNOSIS — M546 Pain in thoracic spine: Secondary | ICD-10-CM | POA: Diagnosis not present

## 2023-03-07 DIAGNOSIS — R200 Anesthesia of skin: Secondary | ICD-10-CM | POA: Insufficient documentation

## 2023-03-07 DIAGNOSIS — R2681 Unsteadiness on feet: Secondary | ICD-10-CM

## 2023-03-12 DIAGNOSIS — R040 Epistaxis: Secondary | ICD-10-CM | POA: Diagnosis not present

## 2023-03-13 ENCOUNTER — Ambulatory Visit: Payer: Medicare Other | Admitting: Physical Therapy

## 2023-03-15 ENCOUNTER — Ambulatory Visit: Payer: Medicare Other

## 2023-03-15 DIAGNOSIS — R040 Epistaxis: Secondary | ICD-10-CM | POA: Diagnosis not present

## 2023-03-20 DIAGNOSIS — R5383 Other fatigue: Secondary | ICD-10-CM | POA: Diagnosis not present

## 2023-03-20 DIAGNOSIS — M0579 Rheumatoid arthritis with rheumatoid factor of multiple sites without organ or systems involvement: Secondary | ICD-10-CM | POA: Diagnosis not present

## 2023-03-20 DIAGNOSIS — Z111 Encounter for screening for respiratory tuberculosis: Secondary | ICD-10-CM | POA: Diagnosis not present

## 2023-03-20 DIAGNOSIS — Z79899 Other long term (current) drug therapy: Secondary | ICD-10-CM | POA: Diagnosis not present

## 2023-03-28 ENCOUNTER — Other Ambulatory Visit: Payer: Self-pay | Admitting: Gastroenterology

## 2023-03-28 DIAGNOSIS — R0981 Nasal congestion: Secondary | ICD-10-CM | POA: Diagnosis not present

## 2023-03-28 DIAGNOSIS — R040 Epistaxis: Secondary | ICD-10-CM | POA: Diagnosis not present

## 2023-04-04 ENCOUNTER — Encounter: Payer: Self-pay | Admitting: Physical Therapy

## 2023-04-04 ENCOUNTER — Ambulatory Visit: Payer: Medicare Other | Attending: Family Medicine | Admitting: Physical Therapy

## 2023-04-04 DIAGNOSIS — R2681 Unsteadiness on feet: Secondary | ICD-10-CM | POA: Insufficient documentation

## 2023-04-04 DIAGNOSIS — R293 Abnormal posture: Secondary | ICD-10-CM | POA: Insufficient documentation

## 2023-04-04 NOTE — Therapy (Signed)
OUTPATIENT PHYSICAL THERAPY NEURO TREATMENT   Patient Name: Jennifer Hull MRN: [id number] DOB:05-26-1941, 82 y.o., female Today's Date: 04/04/2023   PCP: [place], Betty G, M   REFERRING PROVIDER: Judi Saa, DO  END OF SESSION:  PT End of Session - 04/04/23 1446     Visit Number 2    Number of Visits 13    Date for PT Re-Evaluation 04/18/23    Authorization Type Medicare/AARP    PT Start Time 1447    PT Stop Time 1528    PT Time Calculation (min) 41 min    Activity Tolerance Patient tolerated treatment well    Behavior During Therapy [facility] for tasks assessed/performed              Past Medical History:  Diagnosis Date   Anxiety    Arthritis    Chronic constipation    Depression    Hyperlipidemia    Hypothyroidism    Pneumonia    Rheumatoid arthritis (HCC) 2019   Temporal arteritis (HCC)    Thyroid disease    Past Surgical History:  Procedure Laterality Date   ABDOMINAL HYSTERECTOMY     BREAST EXCISIONAL BIOPSY Left    BREAST SURGERY     biopsy   COLONOSCOPY     JOINT REPLACEMENT     REVERSE SHOULDER ARTHROPLASTY Left 06/09/2021   Procedure: REVERSE SHOULDER ARTHROPLASTY;  Surgeon: Jones Broom, MD;  Location: [facility];  Service: Orthopedics;  Laterality: Left;   SHOULDER SURGERY Left 06/09/2021   TEMPORAL ARTERY BIOPSY / LIGATION Bilateral 2014   TONSILLECTOMY     TOTAL SHOULDER ARTHROPLASTY Right 11/22/2017   Procedure: RIGHT REVERSE TOTAL SHOULDER ARTHROPLASTY;  Surgeon: Jones Broom, MD;  Location: [facility];  Service: Orthopedics;  Laterality: Right;   Patient Active Problem List   Diagnosis Date Noted   Bilateral carpal tunnel syndrome 02/21/2023   Acute bronchitis 10/20/2022   Itching 10/06/2022   Irritable bowel syndrome with constipation 06/24/2022   Gastritis without bleeding 06/24/2022   Abdominal pain, epigastric 06/24/2022   S/P reverse total shoulder arthroplasty, 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   Chronic rhinitis 05/13/2021   Chronic pain  disorder 01/24/2021   Pulmonary nodules 11/24/2020   Chronic cough 07/23/2020   Atherosclerosis of aorta (HCC) 07/23/2020   Temporal arteritis (HCC) 09/01/2019   RA (rheumatoid arthritis) (HCC) 09/01/2019   Peripheral neuropathy 07/21/2019   Vitamin D deficiency, unspecified 01/01/2019   Constipation 09/02/2018   Epistaxis, recurrent 12/25/2017   S/P reverse total shoulder arthroplasty, 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   GERD (gastroesophageal reflux disease) 12/26/2016   Generalized anxiety disorder 08/28/2016   Hypothyroidism 07/27/2016   Major depressive disorder, recurrent episode, moderate (HCC) 07/27/2016   Hyperlipidemia 07/27/2016   Insomnia 07/27/2016    ONSET DATE: several years   REFERRING DIAG: R20.0 (ICD-10-CM) - Bilateral hand numbness M54.9 (ICD-10-CM) - Back pain, unspecified back location, unspecified back pain laterality, unspecified chronicity  THERAPY DIAG:  Unsteadiness on feet  Abnormal posture  Rationale for Evaluation and Treatment: Rehabilitation  SUBJECTIVE:  SUBJECTIVE STATEMENT: My daughter visited, so that's why I haven't been in.  Sometimes when I stand or walk too long, the pain starts in scapular area and then goes down into low back.   Pt accompanied by: self  PERTINENT HISTORY: Anxiety, depression, HLD, RA, temporal arteritis, L reverse TSA 2022, R TSA 2019  PAIN:  Are you having pain? Yes: NPRS scale: 3-8 (at worst)/10 Pain location: between shoulder blades, radiating down to B LEs  Pain description: aching Aggravating factors: prolonged standing 20-30 min Relieving factors: Meloxicam/tylenol, massage, laying in recliner  PRECAUTIONS: Fall  WEIGHT BEARING RESTRICTIONS: No  FALLS: Has patient fallen in last 6 months? Yes. Number of falls 1  LIVING  ENVIRONMENT: Lives with: lives with their spouse Lives in: House/apartment Stairs:  chair lift to get up to 2nd floor Has following equipment at home: Shower bench, Grab bars, and quad tip cane  PLOF: Independent  PATIENT GOALS: improve back pain  OBJECTIVE:    TODAY'S TREATMENT: 04/04/2023 Activity Comments  TUG:  21.69 sec   Adjusted cane to lower height to lessen hiking of R shoulder with gait   Reviewed HEP: Seated scapular retraction Resisted scapular retraction Shoulder extension resisted Cues to perform unilaterally to avoid pain in sternum  Supine posture exercises:   -scapular retraction 2 x 5 -thoracic extension in supine 2 x 5 reps (elbows bent and pressing into mat) -thoracic extension over towel roll   STM to upper traps, rhomboids on R side Pt tender to palpation, reports some relief  Instructed in/pt demo self massage with tennis balls to rhomboids  Pt reports relief     Access Code: [email or web address] URL: [email or web address] Date: 04/04/2023 Prepared by: [facility] - Outpatient  Rehab - [facility]  Exercises - Seated Scapular Retraction  - 1 x daily - 5 x weekly - 2 sets - 10 reps - Scapular Retraction with Resistance  - 1 x daily - 5 x weekly - 2 sets - 10 reps - Shoulder extension with resistance - Neutral  - 1 x daily - 5 x weekly - 3 sets - 5 reps - Supine Thoracic Mobilization Towel Roll Vertical  - 1 x daily - 7 x weekly - 1 sets - 3 reps - 30-60 sec hold  PATIENT EDUCATION: Education details: HEP additions/modifications, use of tennis balls for self-massage; discussed POC Person educated: Patient Education method: Explanation, Demonstration, Verbal cues, and Handouts Education comprehension: verbalized understanding, returned demonstration, and needs further education  ---------------------------------------------------------------- Objective measures taken at initial eval:  DIAGNOSTIC FINDINGS: 02/21/23 cervical xray: Degenerative  changes. No acute osseous abnormalities.  02/21/23 thoracic xray: Osteopenia.  No acute osseous abnormalities identified.  COGNITION: Overall cognitive status: Within functional limits for tasks assessed   SENSATION: Intact B to light touch  PALPATION:   soft tissue restriction and TTP in B UT, R LS, rhomboids; also tight but not tender in thoracolumbar paraspinals   POSTURE: slight R trunk lean, elevated shoulders, rounded/kyphotic posture, B toe out and fallen L arch  LOWER EXTREMITY ROM:     Active  Right Eval Left Eval  Hip flexion    Hip extension    Hip abduction    Hip adduction    Hip internal rotation    Hip external rotation    Knee flexion    Knee extension    Ankle dorsiflexion 14 11  Ankle plantarflexion    Ankle inversion    Ankle eversion     (Blank rows = not tested)  LOWER EXTREMITY MMT:    MMT (in sitting) Right Eval Left Eval  Hip flexion 4+ 4+  Hip extension    Hip abduction 4 4  Hip adduction 4 4  Hip internal rotation    Hip external rotation    Knee flexion 4 5  Knee extension 4+ 4+  Ankle dorsiflexion 5 5  Ankle plantarflexion 4+ 4+  Ankle inversion    Ankle eversion    (Blank rows = not tested)  THORACIC ROM:   Active  A/PROM  eval  Flexion WNL  Extension WNL  Right lateral flexion WNL  Left lateral flexion WNL  Right rotation 25% limited  Left rotation WNL   (Blank rows = not tested)   GAIT: Gait pattern: imbalance, lateral trunk lean, flexed trunk excessive toe out Assistive device utilized:  quad tip cane  5xSTS:  28.18 sec pushing off chair    TODAY'S TREATMENT:                                                                                                                              DATE: 03/07/23    PATIENT EDUCATION: Education details: edu on benefits of OT if CTS returns; prognosis, POC, HEP Person educated: Patient Education method: Explanation, Demonstration, Tactile cues, Verbal cues, and  Handouts Education comprehension: verbalized understanding and returned demonstration  HOME EXERCISE PROGRAM: Access Code: [email or web address] URL: [email or web address] Date: 03/07/2023 Prepared by: [facility] - Outpatient  Rehab - [facility]  Exercises - Seated Scapular Retraction  - 1 x daily - 5 x weekly - 2 sets - 10 reps - Scapular Retraction with Resistance  - 1 x daily - 5 x weekly - 2 sets - 10 reps - Shoulder extension with resistance - Neutral  - 1 x daily - 5 x weekly - 2 sets - 10 reps - Seated Thoracic Lumbar Extension  - 1 x daily - 5 x weekly - 2 sets - 10 reps   GOALS: Goals reviewed with patient? Yes  SHORT TERM GOALS: Target date: 03/28/2023  Patient to be independent with initial HEP. Baseline: HEP initiated Goal status: IN PROGRESS 04/04/2023    LONG TERM GOALS: Target date: 04/18/2023  Patient to be independent with advanced HEP. Baseline: Not yet initiated  Goal status: IN PROGRESS  Patient to report 75% improvement in thoracic pain. Baseline: - Goal status: IN PROGRESS  Patient to report and demonstrate improved head, neck, and shoulder posture at rest and with activity.  Baseline: rounded and kyphotic posture Goal status: IN PROGRESS  Patient to report tolerance for 30 minutes of standing without thoracic pain limiting. Baseline: Unable Goal status: IN PROGRESS  Patient to complete TUG in <14 sec with LRAD in order to decrease risk of falls.   Baseline: NT Goal status: IN PROGRESS  Patient to demonstrate 5xSTS test in <15 sec in order to decrease risk of falls.  Baseline: 28 sec Goal status: IN PROGRESS  ASSESSMENT:  CLINICAL  IMPRESSION: Pt returns to OPPT today, for first visit since 03/07/23; pt was not available to schedule appointments due to family member visiting in town.  Reviewed HEP today with pt return demo fairly good understanding, made a few changes for positioning.  Lowered pt's cane, as the higher height appeared to  cause hiking of R shoulder with gait.  With gentle STM, pt reports relief, and instructed in self-massage to R rhomboids/UT with tennis ball.  She reports less pain and improved postural awareness upon leaving session today.    OBJECTIVE IMPAIRMENTS: Abnormal gait, decreased activity tolerance, decreased balance, difficulty walking, decreased strength, increased muscle spasms, improper body mechanics, postural dysfunction, and pain.   ACTIVITY LIMITATIONS: lifting, sitting, standing, and hygiene/grooming  PARTICIPATION LIMITATIONS: meal prep, cleaning, laundry, shopping, community activity, and church  PERSONAL FACTORS: Age, Fitness, Past/current experiences, Time since onset of injury/illness/exacerbation, and 3+ comorbidities: Anxiety, depression, HLD, RA, temporal arteritis, L reverse TSA 2022, R TSA 2019  are also affecting patient's functional outcome.   REHAB POTENTIAL: Good  CLINICAL DECISION MAKING: Evolving/moderate complexity  EVALUATION COMPLEXITY: Moderate  PLAN:  PT FREQUENCY: 1-2x/week  PT DURATION: 6 weeks  PLANNED INTERVENTIONS: Therapeutic exercises, Therapeutic activity, Neuromuscular re-education, Balance training, Gait training, Patient/Family education, Self Care, Joint mobilization, Stair training, Vestibular training, Canalith repositioning, DME instructions, Aquatic Therapy, Dry Needling, Electrical stimulation, Cryotherapy, Moist heat, Taping, Manual therapy, and Re-evaluation  PLAN FOR NEXT SESSION: Review HEP, continue postural edu, progress periscapular strengthening, balance (needs to schedule through 7/10 at least and discuss additional visits/ POC since pt missed almost 1 month)   Lonia Blood, PT 04/04/23 4:44 PM Phone: [phone] Fax: [phone]   [facility] at [facility] [address] [place], [place] [address] Phone # [phone] Fax # [phone]

## 2023-04-06 NOTE — Progress Notes (Unsigned)
[facility] [address] [place] [address] Phone: [phone] Subjective:   Jennifer Hull, am serving as a scribe for Dr. Antoine Primas.  I'm seeing this patient by the request  of:  [place], Betty G, MD  CC: bilateral wrist pain   [id number]  02/21/2023 Has had bilateral arthroplasties of the shoulders done.  Do have weakness noted.  Will refer the patient to physical therapy to see if we can do some strengthening conditioning.     Bilateral injection given today and tolerated the procedure well. Discussed icing regimen and home exercises, discussed which activities to do and which ones to avoid. Increase activity slowly over the course of next several weeks. Discussed icing regimen and home exercises. Follow-up with me again in 6 to 8 weeks. If having difficulty we will consider the possibility of nerve conduction study.   Updated 04/10/2023 Jennifer Hull is a 82 y.o. female coming in with complaint of B carpal tunnel, patient was seen previously of May 15 and given bilateral injections.  Patient was to start formal physical therapy and has been to 1 visit since we have seen patient.  Patient states not having pain. No new concerns.      Past Medical History:  Diagnosis Date   Anxiety    Arthritis    Chronic constipation    Depression    Hyperlipidemia    Hypothyroidism    Pneumonia    Rheumatoid arthritis (HCC) 2019   Temporal arteritis (HCC)    Thyroid disease    Past Surgical History:  Procedure Laterality Date   ABDOMINAL HYSTERECTOMY     BREAST EXCISIONAL BIOPSY Left    BREAST SURGERY     biopsy   COLONOSCOPY     JOINT REPLACEMENT     REVERSE SHOULDER ARTHROPLASTY Left 06/09/2021   Procedure: REVERSE SHOULDER ARTHROPLASTY;  Surgeon: Jones Broom, MD;  Location: [facility];  Service: Orthopedics;  Laterality: Left;   SHOULDER SURGERY Left 06/09/2021   TEMPORAL ARTERY BIOPSY / LIGATION Bilateral 2014   TONSILLECTOMY      TOTAL SHOULDER ARTHROPLASTY Right 11/22/2017   Procedure: RIGHT REVERSE TOTAL SHOULDER ARTHROPLASTY;  Surgeon: Jones Broom, MD;  Location: [facility];  Service: Orthopedics;  Laterality: Right;   Social History   Socioeconomic History   Marital status: Married    Spouse name: Not on file   Number of children: Not on file   Years of education: Not on file   Highest education level: Not on file  Occupational History   Not on file  Tobacco Use   Smoking status: Former    Packs/day: 1.50    Years: 8.00    Additional pack years: 0.00    Total pack years: 12.00    Types: Cigarettes    Quit date: 44    Years since quitting: 46.5   Smokeless tobacco: Never   Tobacco comments:    smoked infrequently when she was young; quit 82 yo   Vaping Use   Vaping Use: Never used  Substance and Sexual Activity   Alcohol use: No   Drug use: No   Sexual activity: Not Currently    Partners: Male    Birth control/protection: Surgical  Other Topics Concern   Not on file  Social History Narrative   Not on file   Social Determinants of Health   Financial Resource Strain: Low Risk  (04/04/2022)   Overall Financial Resource Strain (CARDIA)    Difficulty of Paying  Living Expenses: Not hard at all  Food Insecurity: No Food Insecurity (04/04/2022)   Hunger Vital Sign    Worried About Running Out of Food in the Last Year: Never true    Ran Out of Food in the Last Year: Never true  Transportation Needs: No Transportation Needs (04/04/2022)   PRAPARE - Administrator, Civil Service (Medical): No    [place] of Transportation (Non-Medical): No  Physical Activity: Inactive (04/04/2022)   Exercise Vital Sign    Days of Exercise per Week: 0 days    Minutes of Exercise per Session: 0 min  Stress: No Stress Concern Present (04/04/2022)   [employer] of Occupational Health - Occupational Stress Questionnaire    Feeling of Stress : Not at all  Social Connections: Not on file   Allergies   Allergen Reactions   Tdap [Tetanus-Diphth-Acell Pertussis] Other (See Comments)    SEVERE ALLERGIC REACTIONS (NECK STIFFNESS/HIGH FEVER/ETC)   Tetanus Toxoid, Adsorbed     Neck stiffness, and high fever Other reaction(s): Other (See Comments) Neck stiffness, and high fever   Hydroxychloroquine Sulfate Rash   Covid-19 (Mrna) Vaccine Proofreader) [Covid-19 (Mrna) Vaccine] Rash   Family History  Problem Relation Age of Onset   Kidney cancer Mother    Heart disease Father    Breast cancer Maternal Aunt    Colon cancer Neg Hx    Esophageal cancer Neg Hx    Rectal cancer Neg Hx    Stomach cancer Neg Hx    Inflammatory bowel disease Neg Hx    Liver disease Neg Hx    Pancreatic cancer Neg Hx     Current Outpatient Medications (Endocrine & Metabolic):    levothyroxine (SYNTHROID) 112 MCG tablet, TAKE 1 TABLET(112 MCG) BY MOUTH DAILY BEFORE BREAKFAST  Current Outpatient Medications (Cardiovascular):    atorvastatin (LIPITOR) 20 MG tablet, Take 0.5 tablets (10 mg total) by mouth daily.  Current Outpatient Medications (Respiratory):    fluticasone (FLONASE) 50 MCG/ACT nasal spray, Place 2 sprays into both nostrils at bedtime.   loratadine (CLARITIN) 10 MG tablet, TAKE 1 TABLET(10 MG) BY MOUTH DAILY  Current Outpatient Medications (Analgesics):    acetaminophen (TYLENOL) 500 MG tablet, Take 1,000 mg by mouth every 8 (eight) hours as needed for moderate pain.   Tocilizumab (ACTEMRA IV), Inject into the vein every 30 (thirty) days.  Current Outpatient Medications (Hematological):    folic acid (FOLVITE) 800 MCG tablet, Take 800 mcg by mouth daily.  Current Outpatient Medications (Other):    bisacodyl (DULCOLAX) 10 MG suppository, Every 2 days as needed.   Calcium Carb-Cholecalciferol (CALCIUM 600+D3 PO), Take 1 tablet by mouth daily with lunch.   cholecalciferol (VITAMIN D3) 25 MCG (1000 UNIT) tablet, Take 4,000 Units by mouth daily.   hydrocortisone (ANUSOL-HC) 25 MG suppository, Place 1  suppository (25 mg total) rectally at bedtime. For 1 week then every other night until done   linaclotide (LINZESS) 290 MCG CAPS capsule, Take 1 capsule (290 mcg total) by mouth daily before breakfast.   magnesium oxide (MAG-OX) 400 MG tablet, Take 400 mg by mouth 2 (two) times daily.   Melatonin 10 MG CAPS, Take 10 mg by mouth at bedtime.   pantoprazole (PROTONIX) 40 MG tablet, Take 1 tablet (40 mg total) by mouth daily.   polyethylene glycol (MIRALAX / GLYCOLAX) 17 g packet, Take 17 g by mouth daily.   sertraline (ZOLOFT) 100 MG tablet, TAKE 1 AND 1/2 TABLETS BY MOUTH EVERY DAY   sucralfate (CARAFATE)  1 g tablet, TAKE 1 TABLET BY MOUTH IN THE MORNING AND AT BEDTIME   sucralfate (CARAFATE) 1 GM/10ML suspension, Take 1 g by mouth daily as needed.   temazepam (RESTORIL) 15 MG capsule, TAKE 1 CAPSULE BY MOUTH AT BEDTIME AS NEEDED FOR SLEEP   TURMERIC CURCUMIN PO, Take 3,000 mg by mouth daily.   zinc gluconate 50 MG tablet, Take 50 mg by mouth daily.    Objective  Blood pressure 118/60, pulse 71, height 5\' 3"  (1.6 m), weight 129 lb (58.5 kg), SpO2 97 %.   General: No apparent distress alert and oriented x3 mood and affect normal, dressed appropriately.  HEENT: Pupils equal, extraocular movements intact  Respiratory: Patient's speak in full sentences and does not appear short of breath  Cardiovascular: No lower extremity edema, non tender, no erythema  Wrist exam shows still arthritic changes noted.  Patient been doing significantly better with range of motion.  Nontender on Tinel's today.  Improvement in grip strength also noted. Antalgic gait walking with the aid of a cane   Limited muscular skeletal ultrasound was performed and interpreted by Antoine Primas, M  Limited ultrasound shows that there is a decrease in the size of the median nerve noted today.    Impression and Recommendations:    The above documentation has been reviewed and is accurate and complete Judi Saa,  DO

## 2023-04-10 ENCOUNTER — Ambulatory Visit: Payer: Medicare Other | Admitting: Family Medicine

## 2023-04-10 VITALS — BP 118/60 | HR 71 | Ht 63.0 in | Wt 129.0 lb

## 2023-04-10 DIAGNOSIS — E538 Deficiency of other specified B group vitamins: Secondary | ICD-10-CM

## 2023-04-10 DIAGNOSIS — G5603 Carpal tunnel syndrome, bilateral upper limbs: Secondary | ICD-10-CM | POA: Diagnosis not present

## 2023-04-10 MED ORDER — CYANOCOBALAMIN 1000 MCG/ML IJ SOLN
1000.0000 ug | Freq: Once | INTRAMUSCULAR | Status: AC
  Administered 2023-04-10: 1000 ug via INTRAMUSCULAR

## 2023-04-10 NOTE — Assessment & Plan Note (Signed)
Significant decrease in size of the nodes at this moment.  Feeling significantly better.  Will give patient another B12 injection which made significant improvement as well.  Discussed exercises  Bracing at night as necessary.  Follow-up with me again in 3 to 4 months

## 2023-04-10 NOTE — Assessment & Plan Note (Signed)
Has had B12 deficiency previously and given injection today.  Tolerated the procedure well.  Can do monthly if necessary.

## 2023-04-10 NOTE — Patient Instructions (Signed)
Thanks for making our job so easy See you again in 3-4 months just in case B12 injection today

## 2023-04-13 ENCOUNTER — Ambulatory Visit: Payer: Medicare Other | Attending: Family Medicine

## 2023-04-13 DIAGNOSIS — M6281 Muscle weakness (generalized): Secondary | ICD-10-CM | POA: Diagnosis not present

## 2023-04-13 DIAGNOSIS — R262 Difficulty in walking, not elsewhere classified: Secondary | ICD-10-CM | POA: Insufficient documentation

## 2023-04-13 DIAGNOSIS — R293 Abnormal posture: Secondary | ICD-10-CM | POA: Insufficient documentation

## 2023-04-13 DIAGNOSIS — R2681 Unsteadiness on feet: Secondary | ICD-10-CM | POA: Diagnosis not present

## 2023-04-13 DIAGNOSIS — M546 Pain in thoracic spine: Secondary | ICD-10-CM | POA: Insufficient documentation

## 2023-04-13 DIAGNOSIS — M5459 Other low back pain: Secondary | ICD-10-CM | POA: Insufficient documentation

## 2023-04-13 NOTE — Therapy (Signed)
OUTPATIENT PHYSICAL THERAPY NEURO TREATMENT   Patient Name: Jennifer Hull MRN: [id number] DOB:03/20/1941, 82 y.o., female Today's Date: 04/13/2023   PCP: [place], Betty G, M   REFERRING PROVIDER: Judi Saa, DO  END OF SESSION:  PT End of Session - 04/13/23 0933     Visit Number 3    Number of Visits 13    Date for PT Re-Evaluation 04/18/23    Authorization Type Medicare/AARP    PT Start Time 0930    PT Stop Time 1015    PT Time Calculation (min) 45 min    Activity Tolerance Patient tolerated treatment well    Behavior During Therapy [facility] for tasks assessed/performed              Past Medical History:  Diagnosis Date   Anxiety    Arthritis    Chronic constipation    Depression    Hyperlipidemia    Hypothyroidism    Pneumonia    Rheumatoid arthritis (HCC) 2019   Temporal arteritis (HCC)    Thyroid disease    Past Surgical History:  Procedure Laterality Date   ABDOMINAL HYSTERECTOMY     BREAST EXCISIONAL BIOPSY Left    BREAST SURGERY     biopsy   COLONOSCOPY     JOINT REPLACEMENT     REVERSE SHOULDER ARTHROPLASTY Left 06/09/2021   Procedure: REVERSE SHOULDER ARTHROPLASTY;  Surgeon: Jones Broom, MD;  Location: [facility];  Service: Orthopedics;  Laterality: Left;   SHOULDER SURGERY Left 06/09/2021   TEMPORAL ARTERY BIOPSY / LIGATION Bilateral 2014   TONSILLECTOMY     TOTAL SHOULDER ARTHROPLASTY Right 11/22/2017   Procedure: RIGHT REVERSE TOTAL SHOULDER ARTHROPLASTY;  Surgeon: Jones Broom, MD;  Location: [facility];  Service: Orthopedics;  Laterality: Right;   Patient Active Problem List   Diagnosis Date Noted   B12 deficiency 04/10/2023   Bilateral carpal tunnel syndrome 02/21/2023   Acute bronchitis 10/20/2022   Itching 10/06/2022   Irritable bowel syndrome with constipation 06/24/2022   Gastritis without bleeding 06/24/2022   Abdominal pain, epigastric 06/24/2022   S/P reverse total shoulder arthroplasty, 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   Chronic rhinitis  05/13/2021   Chronic pain disorder 01/24/2021   Pulmonary nodules 11/24/2020   Chronic cough 07/23/2020   Atherosclerosis of aorta (HCC) 07/23/2020   Temporal arteritis (HCC) 09/01/2019   RA (rheumatoid arthritis) (HCC) 09/01/2019   Peripheral neuropathy 07/21/2019   Vitamin D deficiency, unspecified 01/01/2019   Constipation 09/02/2018   Epistaxis, recurrent 12/25/2017   S/P reverse total shoulder arthroplasty, 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   GERD (gastroesophageal reflux disease) 12/26/2016   Generalized anxiety disorder 08/28/2016   Hypothyroidism 07/27/2016   Major depressive disorder, recurrent episode, moderate (HCC) 07/27/2016   Hyperlipidemia 07/27/2016   Insomnia 07/27/2016    ONSET DATE: several years   REFERRING DIAG: R20.0 (ICD-10-CM) - Bilateral hand numbness M54.9 (ICD-10-CM) - Back pain, unspecified back location, unspecified back pain laterality, unspecified chronicity  THERAPY DIAG:  Unsteadiness on feet  Abnormal posture  Pain in thoracic spine  Other low back pain  Muscle weakness (generalized)  Difficulty in walking, not elsewhere classified  Rationale for Evaluation and Treatment: Rehabilitation  SUBJECTIVE:  SUBJECTIVE STATEMENT: Upper back is feeling better. Not much discomfort   Pt accompanied by: self  PERTINENT HISTORY: Anxiety, depression, HLD, RA, temporal arteritis, L reverse TSA 2022, R TSA 2019  PAIN:  Are you having pain? Yes: NPRS scale: 1-2/10 Pain location: between shoulder blades  Pain description: aching Aggravating factors: prolonged standing 20-30 min Relieving factors: Meloxicam/tylenol, massage, laying in recliner  PRECAUTIONS: Fall  WEIGHT BEARING RESTRICTIONS: No  FALLS: Has patient fallen in last 6 months? Yes. Number of falls 1  LIVING  ENVIRONMENT: Lives with: lives with their spouse Lives in: House/apartment Stairs:  chair lift to get up to 2nd floor Has following equipment at home: Shower bench, Grab bars, and quad tip cane  PLOF: Independent  PATIENT GOALS: improve back pain  OBJECTIVE:   TODAY'S TREATMENT: 04/13/23 Activity Comments  Seated scap retraction   Seated ER+retraction 2x10 w/ red  Standing row w/ red 2x10  Standing bilat shoulder ext 2x10 w/ red  Seated lat row 2x10 w/ red  STM/manual therapy Soft-tissue mobilization bilateral c-spine and scapular region to address trigger points and pain R>L along levator and rhomboid      TODAY'S TREATMENT: 04/04/2023 Activity Comments  TUG:  21.69 sec   Adjusted cane to lower height to lessen hiking of R shoulder with gait   Reviewed HEP: Seated scapular retraction Resisted scapular retraction Shoulder extension resisted Cues to perform unilaterally to avoid pain in sternum  Supine posture exercises:   -scapular retraction 2 x 5 -thoracic extension in supine 2 x 5 reps (elbows bent and pressing into mat) -thoracic extension over towel roll   STM to upper traps, rhomboids on R side Pt tender to palpation, reports some relief  Instructed in/pt demo self massage with tennis balls to rhomboids  Pt reports relief     Access Code: [email or web address] URL: [email or web address] Date: 04/04/2023 Prepared by: [facility] - Outpatient  Rehab - [facility]  Exercises - Seated Scapular Retraction  - 1 x daily - 5 x weekly - 2 sets - 10 reps - Scapular Retraction with Resistance  - 1 x daily - 5 x weekly - 2 sets - 10 reps - Shoulder extension with resistance - Neutral  - 1 x daily - 5 x weekly - 3 sets - 5 reps - Supine Thoracic Mobilization Towel Roll Vertical  - 1 x daily - 7 x weekly - 1 sets - 3 reps - 30-60 sec hold - Shoulder External Rotation and Scapular Retraction with Resistance  - 1 x daily - 7 x weekly - 3 sets - 10 reps - Standing High Row with  Anchored Resistance  - 1 x daily - 7 x weekly - 3 sets - 10 reps  PATIENT EDUCATION: Education details: HEP additions/modifications, use of tennis balls for self-massage; discussed POC Person educated: Patient Education method: Explanation, Demonstration, Verbal cues, and Handouts Education comprehension: verbalized understanding, returned demonstration, and needs further education  ---------------------------------------------------------------- Objective measures taken at initial eval:  DIAGNOSTIC FINDINGS: 02/21/23 cervical xray: Degenerative changes. No acute osseous abnormalities.  02/21/23 thoracic xray: Osteopenia.  No acute osseous abnormalities identified.  COGNITION: Overall cognitive status: Within functional limits for tasks assessed   SENSATION: Intact B to light touch  PALPATION:   soft tissue restriction and TTP in B UT, R LS, rhomboids; also tight but not tender in thoracolumbar paraspinals   POSTURE: slight R trunk lean, elevated shoulders, rounded/kyphotic posture, B toe out and fallen L arch  LOWER EXTREMITY ROM:  Active  Right Eval Left Eval  Hip flexion    Hip extension    Hip abduction    Hip adduction    Hip internal rotation    Hip external rotation    Knee flexion    Knee extension    Ankle dorsiflexion 14 11  Ankle plantarflexion    Ankle inversion    Ankle eversion     (Blank rows = not tested)  LOWER EXTREMITY MMT:    MMT (in sitting) Right Eval Left Eval  Hip flexion 4+ 4+  Hip extension    Hip abduction 4 4  Hip adduction 4 4  Hip internal rotation    Hip external rotation    Knee flexion 4 5  Knee extension 4+ 4+  Ankle dorsiflexion 5 5  Ankle plantarflexion 4+ 4+  Ankle inversion    Ankle eversion    (Blank rows = not tested)  THORACIC ROM:   Active  A/PROM  eval  Flexion WNL  Extension WNL  Right lateral flexion WNL  Left lateral flexion WNL  Right rotation 25% limited  Left rotation WNL   (Blank rows = not  tested)   GAIT: Gait pattern: imbalance, lateral trunk lean, flexed trunk excessive toe out Assistive device utilized:  quad tip cane  5xSTS:  28.18 sec pushing off chair    TODAY'S TREATMENT:                                                                                                                              DATE: 03/07/23    PATIENT EDUCATION: Education details: edu on benefits of OT if CTS returns; prognosis, POC, HEP Person educated: Patient Education method: Explanation, Demonstration, Tactile cues, Verbal cues, and Handouts Education comprehension: verbalized understanding and returned demonstration  HOME EXERCISE PROGRAM: Access Code: [email or web address] URL: [email or web address] Date: 03/07/2023 Prepared by: [facility] - Outpatient  Rehab - [facility]  Exercises - Seated Scapular Retraction  - 1 x daily - 5 x weekly - 2 sets - 10 reps - Scapular Retraction with Resistance  - 1 x daily - 5 x weekly - 2 sets - 10 reps - Shoulder extension with resistance - Neutral  - 1 x daily - 5 x weekly - 2 sets - 10 reps - Seated Thoracic Lumbar Extension  - 1 x daily - 5 x weekly - 2 sets - 10 reps   GOALS: Goals reviewed with patient? Yes  SHORT TERM GOALS: Target date: 03/28/2023  Patient to be independent with initial HEP. Baseline: HEP initiated Goal status: IN PROGRESS 04/04/2023    LONG TERM GOALS: Target date: 04/18/2023  Patient to be independent with advanced HEP. Baseline: Not yet initiated  Goal status: IN PROGRESS  Patient to report 75% improvement in thoracic pain. Baseline: - Goal status: IN PROGRESS  Patient to report and demonstrate improved head, neck, and shoulder posture at rest and with activity.  Baseline: rounded and kyphotic posture Goal  status: IN PROGRESS  Patient to report tolerance for 30 minutes of standing without thoracic pain limiting. Baseline: Unable Goal status: IN PROGRESS  Patient to complete TUG in <14 sec with  LRAD in order to decrease risk of falls.   Baseline: NT Goal status: IN PROGRESS  Patient to demonstrate 5xSTS test in <15 sec in order to decrease risk of falls.  Baseline: 28 sec Goal status: IN PROGRESS  ASSESSMENT:  CLINICAL IMPRESSION: Demo excellent HEP recall and tolerating red resistance without issue and notes overall decrease in pain/sensitivity.  Very weak shoulder external rotation but appreciate palpable posterior deltoid contraction.  Tolerated new additions without adverse effect. Soft tissue mobilization to reduce pain and tenderness tolerated well.  Pt reports she would like to try additional visits to address deficits and limitations.  May be ready to increase resistance to green for HEP   OBJECTIVE IMPAIRMENTS: Abnormal gait, decreased activity tolerance, decreased balance, difficulty walking, decreased strength, increased muscle spasms, improper body mechanics, postural dysfunction, and pain.   ACTIVITY LIMITATIONS: lifting, sitting, standing, and hygiene/grooming  PARTICIPATION LIMITATIONS: meal prep, cleaning, laundry, shopping, community activity, and church  PERSONAL FACTORS: Age, Fitness, Past/current experiences, Time since onset of injury/illness/exacerbation, and 3+ comorbidities: Anxiety, depression, HLD, RA, temporal arteritis, L reverse TSA 2022, R TSA 2019  are also affecting patient's functional outcome.   REHAB POTENTIAL: Good  CLINICAL DECISION MAKING: Evolving/moderate complexity  EVALUATION COMPLEXITY: Moderate  PLAN:  PT FREQUENCY: 1-2x/week  PT DURATION: 6 weeks  PLANNED INTERVENTIONS: Therapeutic exercises, Therapeutic activity, Neuromuscular re-education, Balance training, Gait training, Patient/Family education, Self Care, Joint mobilization, Stair training, Vestibular training, Canalith repositioning, DME instructions, Aquatic Therapy, Dry Needling, Electrical stimulation, Cryotherapy, Moist heat, Taping, Manual therapy, and  Re-evaluation  PLAN FOR NEXT SESSION: Review HEP, continue postural edu, progress periscapular strengthening, balance (needs to schedule through 7/10 at least and discuss additional visits/ POC since pt missed almost 1 month)   10:47 AM, 04/13/23 M. Shary Decamp, PT, DPT Physical Therapist- [place] Office Number: [phone]

## 2023-04-16 ENCOUNTER — Ambulatory Visit: Payer: Medicare Other

## 2023-04-16 DIAGNOSIS — M5459 Other low back pain: Secondary | ICD-10-CM

## 2023-04-16 DIAGNOSIS — R2681 Unsteadiness on feet: Secondary | ICD-10-CM | POA: Diagnosis not present

## 2023-04-16 DIAGNOSIS — R293 Abnormal posture: Secondary | ICD-10-CM

## 2023-04-16 DIAGNOSIS — M6281 Muscle weakness (generalized): Secondary | ICD-10-CM | POA: Diagnosis not present

## 2023-04-16 DIAGNOSIS — M546 Pain in thoracic spine: Secondary | ICD-10-CM | POA: Diagnosis not present

## 2023-04-16 DIAGNOSIS — R262 Difficulty in walking, not elsewhere classified: Secondary | ICD-10-CM | POA: Diagnosis not present

## 2023-04-16 NOTE — Therapy (Signed)
OUTPATIENT PHYSICAL THERAPY NEURO TREATMENT and Recertification    Patient Name: Jennifer Hull MRN: [id number] DOB:Jul 07, 1941, 82 y.o., female Today's Date: 04/16/2023   PCP: [place], Betty G, M   REFERRING PROVIDER: Judi Saa, DO  END OF SESSION:  PT End of Session - 04/16/23 1315     Visit Number 4    Number of Visits 8    Date for PT Re-Evaluation 05/14/23    Authorization Type Medicare/AARP    PT Start Time 1315    PT Stop Time 1400    PT Time Calculation (min) 45 min    Activity Tolerance Patient tolerated treatment well    Behavior During Therapy [facility] for tasks assessed/performed              Past Medical History:  Diagnosis Date   Anxiety    Arthritis    Chronic constipation    Depression    Hyperlipidemia    Hypothyroidism    Pneumonia    Rheumatoid arthritis (HCC) 2019   Temporal arteritis (HCC)    Thyroid disease    Past Surgical History:  Procedure Laterality Date   ABDOMINAL HYSTERECTOMY     BREAST EXCISIONAL BIOPSY Left    BREAST SURGERY     biopsy   COLONOSCOPY     JOINT REPLACEMENT     REVERSE SHOULDER ARTHROPLASTY Left 06/09/2021   Procedure: REVERSE SHOULDER ARTHROPLASTY;  Surgeon: Jones Broom, MD;  Location: [facility];  Service: Orthopedics;  Laterality: Left;   SHOULDER SURGERY Left 06/09/2021   TEMPORAL ARTERY BIOPSY / LIGATION Bilateral 2014   TONSILLECTOMY     TOTAL SHOULDER ARTHROPLASTY Right 11/22/2017   Procedure: RIGHT REVERSE TOTAL SHOULDER ARTHROPLASTY;  Surgeon: Jones Broom, MD;  Location: [facility];  Service: Orthopedics;  Laterality: Right;   Patient Active Problem List   Diagnosis Date Noted   B12 deficiency 04/10/2023   Bilateral carpal tunnel syndrome 02/21/2023   Acute bronchitis 10/20/2022   Itching 10/06/2022   Irritable bowel syndrome with constipation 06/24/2022   Gastritis without bleeding 06/24/2022   Abdominal pain, epigastric 06/24/2022   S/P reverse total shoulder arthroplasty, 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    Chronic rhinitis 05/13/2021   Chronic pain disorder 01/24/2021   Pulmonary nodules 11/24/2020   Chronic cough 07/23/2020   Atherosclerosis of aorta (HCC) 07/23/2020   Temporal arteritis (HCC) 09/01/2019   RA (rheumatoid arthritis) (HCC) 09/01/2019   Peripheral neuropathy 07/21/2019   Vitamin D deficiency, unspecified 01/01/2019   Constipation 09/02/2018   Epistaxis, recurrent 12/25/2017   S/P reverse total shoulder arthroplasty, 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   GERD (gastroesophageal reflux disease) 12/26/2016   Generalized anxiety disorder 08/28/2016   Hypothyroidism 07/27/2016   Major depressive disorder, recurrent episode, moderate (HCC) 07/27/2016   Hyperlipidemia 07/27/2016   Insomnia 07/27/2016    ONSET DATE: several years   REFERRING DIAG: R20.0 (ICD-10-CM) - Bilateral hand numbness M54.9 (ICD-10-CM) - Back pain, unspecified back location, unspecified back pain laterality, unspecified chronicity  THERAPY DIAG:  Unsteadiness on feet  Abnormal posture  Pain in thoracic spine  Other low back pain  Muscle weakness (generalized)  Rationale for Evaluation and Treatment: Rehabilitation  SUBJECTIVE:  SUBJECTIVE STATEMENT: Upper back is ok, not too sore after last session.   Pt accompanied by: self  PERTINENT HISTORY: Anxiety, depression, HLD, RA, temporal arteritis, L reverse TSA 2022, R TSA 2019  PAIN:  Are you having pain? Yes: NPRS scale: 1-2/10 Pain location: between shoulder blades  Pain description: aching Aggravating factors: prolonged standing 20-30 min Relieving factors: Meloxicam/tylenol, massage, laying in recliner  PRECAUTIONS: Fall  WEIGHT BEARING RESTRICTIONS: No  FALLS: Has patient fallen in last 6 months? Yes. Number of falls 1  LIVING ENVIRONMENT: Lives with: lives  with their spouse Lives in: House/apartment Stairs:  chair lift to get up to 2nd floor Has following equipment at home: Shower bench, Grab bars, and quad tip cane  PLOF: Independent  PATIENT GOALS: improve back pain  OBJECTIVE:   TODAY'S TREATMENT: 04/16/23 Activity Comments  STG/LTG   Berg Balance Test 46/56  HEP review  -use of green resistance for rows  M-CTSIB Condition 1: mild Condition 2: moderate Condition 3: mild-mod Condition 4: moderate  Standing on foam -EO/EC x 30 sec -head turns 3x EO/EC  Cone taps 1x10   Sidestepping x 2 min Added to HEP     TODAY'S TREATMENT: 04/13/23 Activity Comments  Seated scap retraction   Seated ER+retraction 2x10 w/ red  Standing row w/ red 2x10  Standing bilat shoulder ext 2x10 w/ red  Seated lat row 2x10 w/ red  STM/manual therapy Soft-tissue mobilization bilateral c-spine and scapular region to address trigger points and pain R>L along levator and rhomboid       Access Code: [email or web address] URL: [email or web address] Date: 04/04/2023 Prepared by: [facility] - Outpatient  Rehab - [facility]  Exercises - Seated Scapular Retraction  - 1 x daily - 5 x weekly - 2 sets - 10 reps - Scapular Retraction with Resistance  - 1 x daily - 5 x weekly - 2 sets - 10 reps - Shoulder extension with resistance - Neutral  - 1 x daily - 5 x weekly - 3 sets - 5 reps - Supine Thoracic Mobilization Towel Roll Vertical  - 1 x daily - 7 x weekly - 1 sets - 3 reps - 30-60 sec hold - Shoulder External Rotation and Scapular Retraction with Resistance  - 1 x daily - 7 x weekly - 3 sets - 10 reps - Standing High Row with Anchored Resistance  - 1 x daily - 7 x weekly - 3 sets - 10 reps -Sidestepping along counter x 2 min  PATIENT EDUCATION: Education details: STG/LTG review POC details, continued PT sessions Person educated: Patient Education method: Explanation, Demonstration, Verbal cues, and Handouts Education comprehension: verbalized  understanding, returned demonstration, and needs further education  ---------------------------------------------------------------- Objective measures taken at initial eval:  DIAGNOSTIC FINDINGS: 02/21/23 cervical xray: Degenerative changes. No acute osseous abnormalities.  02/21/23 thoracic xray: Osteopenia.  No acute osseous abnormalities identified.  COGNITION: Overall cognitive status: Within functional limits for tasks assessed   SENSATION: Intact B to light touch  PALPATION:   soft tissue restriction and TTP in B UT, R LS, rhomboids; also tight but not tender in thoracolumbar paraspinals   POSTURE: slight R trunk lean, elevated shoulders, rounded/kyphotic posture, B toe out and fallen L arch  LOWER EXTREMITY ROM:     Active  Right Eval Left Eval  Hip flexion    Hip extension    Hip abduction    Hip adduction    Hip internal rotation    Hip external rotation    Knee  flexion    Knee extension    Ankle dorsiflexion 14 11  Ankle plantarflexion    Ankle inversion    Ankle eversion     (Blank rows = not tested)  LOWER EXTREMITY MMT:    MMT (in sitting) Right Eval Left Eval  Hip flexion 4+ 4+  Hip extension    Hip abduction 4 4  Hip adduction 4 4  Hip internal rotation    Hip external rotation    Knee flexion 4 5  Knee extension 4+ 4+  Ankle dorsiflexion 5 5  Ankle plantarflexion 4+ 4+  Ankle inversion    Ankle eversion    (Blank rows = not tested)  THORACIC ROM:   Active  A/PROM  eval  Flexion WNL  Extension WNL  Right lateral flexion WNL  Left lateral flexion WNL  Right rotation 25% limited  Left rotation WNL   (Blank rows = not tested)   GAIT: Gait pattern: imbalance, lateral trunk lean, flexed trunk excessive toe out Assistive device utilized:  quad tip cane  5xSTS:  28.18 sec pushing off chair       GOALS: Goals reviewed with patient? Yes  SHORT TERM GOALS: Target date: 03/28/2023  Patient to be independent with initial  HEP. Baseline: HEP initiated Goal status: MET 04/04/2023    LONG TERM GOALS: Target date: 05/14/2023    Patient to be independent with advanced HEP. Baseline: Not yet initiated  Goal status: IN PROGRESS  Patient to report 75% improvement in thoracic pain. Baseline: 75% improvement Goal status: MET  Patient to report and demonstrate improved head, neck, and shoulder posture at rest and with activity.  Baseline: rounded and kyphotic posture w/ activity/divided attention Goal status: IN PROGRESS  Patient to report tolerance for 30 minutes of standing without thoracic pain limiting. Baseline: Unable; (04/16/23) no issue Goal status: MET  Patient to complete TUG in <14 sec with LRAD in order to decrease risk of falls.   Baseline: 12 sec Goal status: MET  Patient to demonstrate 5xSTS test in <15 sec in order to decrease risk of falls.  Baseline: 28 sec; (04/16/23) 32 sec Goal status: IN PROGRESS  > Demo reduced risk for falls per score 50/56 Berg Balance Test  -Baseline: 46/56  -Goal status: INITIAL  > Demo improved postural stability per mild sway condition 4 M-CTSIB to improve safety with ADL  -Baseline: moderate x 30 sec  -Goal status: INITIAL  ASSESSMENT:  CLINICAL IMPRESSION: Pt notes overall improved myofascial pain in her thoracic region and has reports improved mindfulness with posture but notes when performing activity/mobility returns to kyphotic and flexed posture.  Goals addressed/performed and reveal improved time for TUG test assessment but continues to demo difficulty/weakness with 5xSTS test requiring increased time. Berg Balance Test reveals score 46/56 indicating low risk for falls with most prominet difficulty with tasks in narrow BOS/single limb support.  Pt would benefit from continued sessions to address with comprehensive HEP for posture, balance, and LE strength to reduce risk for falls. Increased UE resistance to green for compound row and shoulder extension,  red resistance for others.    OBJECTIVE IMPAIRMENTS: Abnormal gait, decreased activity tolerance, decreased balance, difficulty walking, decreased strength, increased muscle spasms, improper body mechanics, postural dysfunction, and pain.   ACTIVITY LIMITATIONS: lifting, sitting, standing, and hygiene/grooming  PARTICIPATION LIMITATIONS: meal prep, cleaning, laundry, shopping, community activity, and church  PERSONAL FACTORS: Age, Fitness, Past/current experiences, Time since onset of injury/illness/exacerbation, and 3+ comorbidities: Anxiety, depression, HLD, RA, temporal  arteritis, L reverse TSA 2022, R TSA 2019  are also affecting patient's functional outcome.   REHAB POTENTIAL: Good  CLINICAL DECISION MAKING: Evolving/moderate complexity  EVALUATION COMPLEXITY: Moderate  PLAN:  PT FREQUENCY: 1-2x/week  PT DURATION: 6 weeks  PLANNED INTERVENTIONS: Therapeutic exercises, Therapeutic activity, Neuromuscular re-education, Balance training, Gait training, Patient/Family education, Self Care, Joint mobilization, Stair training, Vestibular training, Canalith repositioning, DME instructions, Aquatic Therapy, Dry Needling, Electrical stimulation, Cryotherapy, Moist heat, Taping, Manual therapy, and Re-evaluation  PLAN FOR NEXT SESSION: Review HEP, continue postural edu, progress periscapular strengthening, balance    1:50 PM, 04/16/23 M. Shary Decamp, PT, DPT Physical Therapist- [place] Office Number: [phone]

## 2023-04-17 DIAGNOSIS — M0579 Rheumatoid arthritis with rheumatoid factor of multiple sites without organ or systems involvement: Secondary | ICD-10-CM | POA: Diagnosis not present

## 2023-04-19 DIAGNOSIS — R040 Epistaxis: Secondary | ICD-10-CM | POA: Diagnosis not present

## 2023-04-24 ENCOUNTER — Ambulatory Visit: Payer: Medicare Other

## 2023-04-24 DIAGNOSIS — M6281 Muscle weakness (generalized): Secondary | ICD-10-CM | POA: Diagnosis not present

## 2023-04-24 DIAGNOSIS — M5459 Other low back pain: Secondary | ICD-10-CM

## 2023-04-24 DIAGNOSIS — R2681 Unsteadiness on feet: Secondary | ICD-10-CM

## 2023-04-24 DIAGNOSIS — R293 Abnormal posture: Secondary | ICD-10-CM | POA: Diagnosis not present

## 2023-04-24 DIAGNOSIS — M546 Pain in thoracic spine: Secondary | ICD-10-CM | POA: Diagnosis not present

## 2023-04-24 DIAGNOSIS — R262 Difficulty in walking, not elsewhere classified: Secondary | ICD-10-CM | POA: Diagnosis not present

## 2023-04-24 NOTE — Therapy (Signed)
OUTPATIENT PHYSICAL THERAPY NEURO TREATMENT     Patient Name: Jennifer Hull MRN: [id number] DOB:1941-08-08, 82 y.o., female Today's Date: 04/24/2023   PCP: [place], Betty G, M   REFERRING PROVIDER: Judi Saa, DO  END OF SESSION:  PT End of Session - 04/24/23 1401     Visit Number 5    Number of Visits 8    Date for PT Re-Evaluation 05/14/23    Authorization Type Medicare/AARP    PT Start Time 1400    PT Stop Time 1445    PT Time Calculation (min) 45 min    Activity Tolerance Patient tolerated treatment well    Behavior During Therapy [facility] for tasks assessed/performed              Past Medical History:  Diagnosis Date   Anxiety    Arthritis    Chronic constipation    Depression    Hyperlipidemia    Hypothyroidism    Pneumonia    Rheumatoid arthritis (HCC) 2019   Temporal arteritis (HCC)    Thyroid disease    Past Surgical History:  Procedure Laterality Date   ABDOMINAL HYSTERECTOMY     BREAST EXCISIONAL BIOPSY Left    BREAST SURGERY     biopsy   COLONOSCOPY     JOINT REPLACEMENT     REVERSE SHOULDER ARTHROPLASTY Left 06/09/2021   Procedure: REVERSE SHOULDER ARTHROPLASTY;  Surgeon: Jones Broom, MD;  Location: [facility];  Service: Orthopedics;  Laterality: Left;   SHOULDER SURGERY Left 06/09/2021   TEMPORAL ARTERY BIOPSY / LIGATION Bilateral 2014   TONSILLECTOMY     TOTAL SHOULDER ARTHROPLASTY Right 11/22/2017   Procedure: RIGHT REVERSE TOTAL SHOULDER ARTHROPLASTY;  Surgeon: Jones Broom, MD;  Location: [facility];  Service: Orthopedics;  Laterality: Right;   Patient Active Problem List   Diagnosis Date Noted   B12 deficiency 04/10/2023   Bilateral carpal tunnel syndrome 02/21/2023   Acute bronchitis 10/20/2022   Itching 10/06/2022   Irritable bowel syndrome with constipation 06/24/2022   Gastritis without bleeding 06/24/2022   Abdominal pain, epigastric 06/24/2022   S/P reverse total shoulder arthroplasty, 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   Chronic  rhinitis 05/13/2021   Chronic pain disorder 01/24/2021   Pulmonary nodules 11/24/2020   Chronic cough 07/23/2020   Atherosclerosis of aorta (HCC) 07/23/2020   Temporal arteritis (HCC) 09/01/2019   RA (rheumatoid arthritis) (HCC) 09/01/2019   Peripheral neuropathy 07/21/2019   Vitamin D deficiency, unspecified 01/01/2019   Constipation 09/02/2018   Epistaxis, recurrent 12/25/2017   S/P reverse total shoulder arthroplasty, 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   GERD (gastroesophageal reflux disease) 12/26/2016   Generalized anxiety disorder 08/28/2016   Hypothyroidism 07/27/2016   Major depressive disorder, recurrent episode, moderate (HCC) 07/27/2016   Hyperlipidemia 07/27/2016   Insomnia 07/27/2016    ONSET DATE: several years   REFERRING DIAG: R20.0 (ICD-10-CM) - Bilateral hand numbness M54.9 (ICD-10-CM) - Back pain, unspecified back location, unspecified back pain laterality, unspecified chronicity  THERAPY DIAG:  Unsteadiness on feet  Abnormal posture  Pain in thoracic spine  Other low back pain  Muscle weakness (generalized)  Rationale for Evaluation and Treatment: Rehabilitation  SUBJECTIVE:  SUBJECTIVE STATEMENT: Back is feeling ok, no new issues   Pt accompanied by: self  PERTINENT HISTORY: Anxiety, depression, HLD, RA, temporal arteritis, L reverse TSA 2022, R TSA 2019  PAIN:  Are you having pain? Yes: NPRS scale: 1-2/10 Pain location: between shoulder blades  Pain description: aching Aggravating factors: prolonged standing 20-30 min Relieving factors: Meloxicam/tylenol, massage, laying in recliner  PRECAUTIONS: Fall  WEIGHT BEARING RESTRICTIONS: No  FALLS: Has patient fallen in last 6 months? Yes. Number of falls 1  LIVING ENVIRONMENT: Lives with: lives with their spouse Lives in:  House/apartment Stairs:  chair lift to get up to 2nd floor Has following equipment at home: Shower bench, Grab bars, and quad tip cane  PLOF: Independent  PATIENT GOALS: improve back pain  OBJECTIVE:   TODAY'S TREATMENT: 04/24/23 Activity Comments  Seated LE AROM 3x10 Warm-up  Sit-stand 2x5   Corner balance activities See HEP  Sidestepping w/ green t-loop            TODAY'S TREATMENT: 04/16/23 Activity Comments  STG/LTG   Berg Balance Test 46/56  HEP review  -use of green resistance for rows  M-CTSIB Condition 1: mild Condition 2: moderate Condition 3: mild-mod Condition 4: moderate  Standing on foam -EO/EC x 30 sec -head turns 3x EO/EC  Cone taps 1x10   Sidestepping x 2 min Added to HEP           Access Code: [email or web address] URL: [email or web address] Date: 04/04/2023 Prepared by: [facility] - Outpatient  Rehab - [facility]  Exercises - Seated Scapular Retraction  - 1 x daily - 5 x weekly - 2 sets - 10 reps - Scapular Retraction with Resistance  - 1 x daily - 5 x weekly - 2 sets - 10 reps - Shoulder extension with resistance - Neutral  - 1 x daily - 5 x weekly - 3 sets - 5 reps - Supine Thoracic Mobilization Towel Roll Vertical  - 1 x daily - 7 x weekly - 1 sets - 3 reps - 30-60 sec hold - Shoulder External Rotation and Scapular Retraction with Resistance  - 1 x daily - 7 x weekly - 3 sets - 10 reps - Standing High Row with Anchored Resistance  - 1 x daily - 7 x weekly - 3 sets - 10 reps -Sidestepping along counter x 2 min - Side Stepping with Resistance at [employer] and Counter Support  - 1 x daily - 7 x weekly - 1-3 sets - 1-2 minutes hold - Sit to Stand with Resistance Around Legs  - 1 x daily - 7 x weekly - 3 sets - 5 reps - Corner Balance Feet Together With Eyes Open  - 1 x daily - 7 x weekly - 3 sets - 30 sec hold - Corner Balance Feet Together With Eyes Closed  - 1 x daily - 7 x weekly - 3 sets - 30 sec hold - Corner Balance Feet Together: Eyes  Open With Head Turns  - 1 x daily - 7 x weekly - 3 sets - 3 reps - Corner Balance Feet Together: Eyes Closed With Head Turns  - 1 x daily - 7 x weekly - 3 sets - 3 reps  PATIENT EDUCATION: Education details: STG/LTG review POC details, continued PT sessions Person educated: Patient Education method: Explanation, Demonstration, Verbal cues, and Handouts Education comprehension: verbalized understanding, returned demonstration, and needs further education  ---------------------------------------------------------------- Objective measures taken at initial eval:  DIAGNOSTIC FINDINGS: 02/21/23 cervical xray: Degenerative changes.  No acute osseous abnormalities.  02/21/23 thoracic xray: Osteopenia.  No acute osseous abnormalities identified.  COGNITION: Overall cognitive status: Within functional limits for tasks assessed   SENSATION: Intact B to light touch  PALPATION:   soft tissue restriction and TTP in B UT, R LS, rhomboids; also tight but not tender in thoracolumbar paraspinals   POSTURE: slight R trunk lean, elevated shoulders, rounded/kyphotic posture, B toe out and fallen L arch  LOWER EXTREMITY ROM:     Active  Right Eval Left Eval  Hip flexion    Hip extension    Hip abduction    Hip adduction    Hip internal rotation    Hip external rotation    Knee flexion    Knee extension    Ankle dorsiflexion 14 11  Ankle plantarflexion    Ankle inversion    Ankle eversion     (Blank rows = not tested)  LOWER EXTREMITY MMT:    MMT (in sitting) Right Eval Left Eval  Hip flexion 4+ 4+  Hip extension    Hip abduction 4 4  Hip adduction 4 4  Hip internal rotation    Hip external rotation    Knee flexion 4 5  Knee extension 4+ 4+  Ankle dorsiflexion 5 5  Ankle plantarflexion 4+ 4+  Ankle inversion    Ankle eversion    (Blank rows = not tested)  THORACIC ROM:   Active  A/PROM  eval  Flexion WNL  Extension WNL  Right lateral flexion WNL  Left lateral flexion WNL   Right rotation 25% limited  Left rotation WNL   (Blank rows = not tested)   GAIT: Gait pattern: imbalance, lateral trunk lean, flexed trunk excessive toe out Assistive device utilized:  quad tip cane  5xSTS:  28.18 sec pushing off chair       GOALS: Goals reviewed with patient? Yes  SHORT TERM GOALS: Target date: 03/28/2023  Patient to be independent with initial HEP. Baseline: HEP initiated Goal status: MET 04/04/2023    LONG TERM GOALS: Target date: 05/14/2023    Patient to be independent with advanced HEP. Baseline: Not yet initiated  Goal status: IN PROGRESS  Patient to report 75% improvement in thoracic pain. Baseline: 75% improvement Goal status: MET  Patient to report and demonstrate improved head, neck, and shoulder posture at rest and with activity.  Baseline: rounded and kyphotic posture w/ activity/divided attention Goal status: IN PROGRESS  Patient to report tolerance for 30 minutes of standing without thoracic pain limiting. Baseline: Unable; (04/16/23) no issue Goal status: MET  Patient to complete TUG in <14 sec with LRAD in order to decrease risk of falls.   Baseline: 12 sec Goal status: MET  Patient to demonstrate 5xSTS test in <15 sec in order to decrease risk of falls.  Baseline: 28 sec; (04/16/23) 32 sec Goal status: IN PROGRESS  > Demo reduced risk for falls per score 50/56 Berg Balance Test  -Baseline: 46/56  -Goal status: INITIAL  > Demo improved postural stability per mild sway condition 4 M-CTSIB to improve safety with ADL  -Baseline: moderate x 30 sec  -Goal status: INITIAL  ASSESSMENT:  CLINICAL IMPRESSION: Initiated additional activities for BLE strength/power to improve control and activity tolerance.  Addition of static balance activities to address unsteadiness. Tolerated activities very well with additions to HEP. Continued sessions to meet POC details   OBJECTIVE IMPAIRMENTS: Abnormal gait, decreased activity tolerance,  decreased balance, difficulty walking, decreased strength, increased muscle spasms, improper  body mechanics, postural dysfunction, and pain.   ACTIVITY LIMITATIONS: lifting, sitting, standing, and hygiene/grooming  PARTICIPATION LIMITATIONS: meal prep, cleaning, laundry, shopping, community activity, and church  PERSONAL FACTORS: Age, Fitness, Past/current experiences, Time since onset of injury/illness/exacerbation, and 3+ comorbidities: Anxiety, depression, HLD, RA, temporal arteritis, L reverse TSA 2022, R TSA 2019  are also affecting patient's functional outcome.   REHAB POTENTIAL: Good  CLINICAL DECISION MAKING: Evolving/moderate complexity  EVALUATION COMPLEXITY: Moderate  PLAN:  PT FREQUENCY: 1-2x/week  PT DURATION: 6 weeks  PLANNED INTERVENTIONS: Therapeutic exercises, Therapeutic activity, Neuromuscular re-education, Balance training, Gait training, Patient/Family education, Self Care, Joint mobilization, Stair training, Vestibular training, Canalith repositioning, DME instructions, Aquatic Therapy, Dry Needling, Electrical stimulation, Cryotherapy, Moist heat, Taping, Manual therapy, and Re-evaluation  PLAN FOR NEXT SESSION: Review HEP, continue postural edu, progress periscapular strengthening, balance    2:02 PM, 04/24/23 M. Shary Decamp, PT, DPT Physical Therapist- [place] Office Number: [phone]

## 2023-04-29 ENCOUNTER — Encounter: Payer: Self-pay | Admitting: Family Medicine

## 2023-05-01 ENCOUNTER — Ambulatory Visit: Payer: Medicare Other | Admitting: Physical Therapy

## 2023-05-04 ENCOUNTER — Other Ambulatory Visit: Payer: Self-pay | Admitting: Family Medicine

## 2023-05-04 ENCOUNTER — Ambulatory Visit: Payer: Medicare Other | Admitting: Physician Assistant

## 2023-05-04 DIAGNOSIS — R739 Hyperglycemia, unspecified: Secondary | ICD-10-CM

## 2023-05-05 ENCOUNTER — Emergency Department (HOSPITAL_BASED_OUTPATIENT_CLINIC_OR_DEPARTMENT_OTHER)
Admission: EM | Admit: 2023-05-05 | Discharge: 2023-05-05 | Disposition: A | Discharge status: Home / Self Care | Payer: Medicare Other | Attending: Emergency Medicine | Admitting: Emergency Medicine

## 2023-05-05 ENCOUNTER — Emergency Department (HOSPITAL_BASED_OUTPATIENT_CLINIC_OR_DEPARTMENT_OTHER): Payer: Medicare Other

## 2023-05-05 ENCOUNTER — Encounter (HOSPITAL_BASED_OUTPATIENT_CLINIC_OR_DEPARTMENT_OTHER): Payer: Self-pay

## 2023-05-05 ENCOUNTER — Other Ambulatory Visit: Payer: Self-pay

## 2023-05-05 DIAGNOSIS — Z96611 Presence of right artificial shoulder joint: Secondary | ICD-10-CM | POA: Diagnosis not present

## 2023-05-05 DIAGNOSIS — R7989 Other specified abnormal findings of blood chemistry: Secondary | ICD-10-CM | POA: Insufficient documentation

## 2023-05-05 DIAGNOSIS — W01198A Fall on same level from slipping, tripping and stumbling with subsequent striking against other object, initial encounter: Secondary | ICD-10-CM | POA: Diagnosis not present

## 2023-05-05 DIAGNOSIS — Z96612 Presence of left artificial shoulder joint: Secondary | ICD-10-CM | POA: Diagnosis not present

## 2023-05-05 DIAGNOSIS — R9089 Other abnormal findings on diagnostic imaging of central nervous system: Secondary | ICD-10-CM | POA: Diagnosis not present

## 2023-05-05 DIAGNOSIS — S022XXA Fracture of nasal bones, initial encounter for closed fracture: Secondary | ICD-10-CM | POA: Diagnosis not present

## 2023-05-05 DIAGNOSIS — R778 Other specified abnormalities of plasma proteins: Secondary | ICD-10-CM | POA: Diagnosis not present

## 2023-05-05 DIAGNOSIS — S0993XA Unspecified injury of face, initial encounter: Secondary | ICD-10-CM | POA: Diagnosis present

## 2023-05-05 DIAGNOSIS — M50222 Other cervical disc displacement at C5-C6 level: Secondary | ICD-10-CM | POA: Diagnosis not present

## 2023-05-05 DIAGNOSIS — R9431 Abnormal electrocardiogram [ECG] [EKG]: Secondary | ICD-10-CM | POA: Diagnosis not present

## 2023-05-05 DIAGNOSIS — W19XXXA Unspecified fall, initial encounter: Secondary | ICD-10-CM

## 2023-05-05 DIAGNOSIS — Z043 Encounter for examination and observation following other accident: Secondary | ICD-10-CM | POA: Diagnosis not present

## 2023-05-05 LAB — URINALYSIS, ROUTINE W REFLEX MICROSCOPIC
Bilirubin Urine: NEGATIVE
Glucose, UA: NEGATIVE mg/dL
Ketones, ur: NEGATIVE mg/dL
Leukocytes,Ua: NEGATIVE
Nitrite: NEGATIVE
Protein, ur: NEGATIVE mg/dL
Specific Gravity, Urine: 1.015 (ref 1.005–1.030)
pH: 7 (ref 5.0–8.0)

## 2023-05-05 LAB — CBC WITH DIFFERENTIAL/PLATELET
Abs Immature Granulocytes: 0.03 10*3/uL (ref 0.00–0.07)
Basophils Absolute: 0.1 10*3/uL (ref 0.0–0.1)
Basophils Relative: 1 %
Eosinophils Absolute: 0.2 10*3/uL (ref 0.0–0.5)
Eosinophils Relative: 3 %
HCT: 37.9 % (ref 36.0–46.0)
Hemoglobin: 13.2 g/dL (ref 12.0–15.0)
Immature Granulocytes: 1 %
Lymphocytes Relative: 26 %
Lymphs Abs: 1.4 10*3/uL (ref 0.7–4.0)
MCH: 31.9 pg (ref 26.0–34.0)
MCHC: 34.8 g/dL (ref 30.0–36.0)
MCV: 91.5 fL (ref 80.0–100.0)
Monocytes Absolute: 0.6 10*3/uL (ref 0.1–1.0)
Monocytes Relative: 11 %
Neutro Abs: 3.1 10*3/uL (ref 1.7–7.7)
Neutrophils Relative %: 58 %
Platelets: 184 10*3/uL (ref 150–400)
RBC: 4.14 MIL/uL (ref 3.87–5.11)
RDW: 12.4 % (ref 11.5–15.5)
WBC: 5.3 10*3/uL (ref 4.0–10.5)
nRBC: 0 % (ref 0.0–0.2)

## 2023-05-05 LAB — BASIC METABOLIC PANEL
Anion gap: 7 (ref 5–15)
BUN: 21 mg/dL (ref 8–23)
CO2: 25 mmol/L (ref 22–32)
Calcium: 9.2 mg/dL (ref 8.9–10.3)
Chloride: 101 mmol/L (ref 98–111)
Creatinine, Ser: 0.63 mg/dL (ref 0.44–1.00)
GFR, Estimated: >60 mL/min (ref >60)
Glucose, Bld: 94 mg/dL (ref 70–99)
Potassium: 4 mmol/L (ref 3.5–5.1)
Sodium: 133 mmol/L — ABNORMAL LOW (ref 135–145)

## 2023-05-05 LAB — URINALYSIS, MICROSCOPIC (REFLEX)
Bacteria, UA: NONE SEEN
Squamous Epithelial / HPF: NONE SEEN /HPF (ref 0–5)
WBC, UA: NONE SEEN WBC/hpf (ref 0–5)

## 2023-05-05 LAB — TROPONIN I (HIGH SENSITIVITY)
Troponin I (High Sensitivity): 26 ng/L — ABNORMAL HIGH (ref <18)
Troponin I (High Sensitivity): 46 ng/L — ABNORMAL HIGH (ref <18)

## 2023-05-05 NOTE — ED Notes (Signed)
Pt declined ice at this time.

## 2023-05-05 NOTE — ED Provider Notes (Signed)
[facility] Provider Note   CSN: [id number] Arrival date & time: 05/05/23  1204     History  Chief Complaint  Patient presents with   Fall   Facial Injury    Jennifer Hull is a 82 y.o. female who presents to the ED with concerns for fall onset PTA. Notes that she was ambulating without her cane (typical for her in her home to ambulate without her walker) when she fell and hit her head on the countertop. Denies anticoagulants. Notes that she was feeling body aches. She denies feeling dizzy, lightheaded before the fall. Denies chest pain, shortness of breath, nausea, vomiting, LOC, urinary symptoms, rhinorrhea, nasal congestion, cough.   The history is provided by the patient. No language interpreter was used.       Home Medications Prior to Admission medications   Medication Sig Start Date End Date Taking? Authorizing Provider  acetaminophen (TYLENOL) 500 MG tablet Take 1,000 mg by mouth every 8 (eight) hours as needed for moderate pain.    [provider]  atorvastatin (LIPITOR) 20 MG tablet Take 0.5 tablets (10 mg total) by mouth daily. 02/23/23   [place], Betty G, MD  bisacodyl (DULCOLAX) 10 MG suppository Every 2 days as needed. 01/18/22   [place], Betty G, MD  Calcium Carb-Cholecalciferol (CALCIUM 600+D3 PO) Take 1 tablet by mouth daily with lunch.    [provider]  cholecalciferol (VITAMIN D3) 25 MCG (1000 UNIT) tablet Take 4,000 Units by mouth daily.    [provider]  fluticasone (FLONASE) 50 MCG/ACT nasal spray Place 2 sprays into both nostrils at bedtime.    [provider]  folic acid (FOLVITE) 800 MCG tablet Take 800 mcg by mouth daily.    [provider]  hydrocortisone (ANUSOL-HC) 25 MG suppository Place 1 suppository (25 mg total) rectally at bedtime. For 1 week then every other night until done 02/26/23   Mansouraty, Netty Starring., MD  levothyroxine (SYNTHROID) 112 MCG tablet TAKE  1 TABLET(112 MCG) BY MOUTH DAILY BEFORE BREAKFAST 02/24/23   [place], Betty G, MD  linaclotide [facility]) 290 MCG CAPS capsule Take 1 capsule (290 mcg total) by mouth daily before breakfast. 07/05/22   Mansouraty, Netty Starring., MD  loratadine (CLARITIN) 10 MG tablet TAKE 1 TABLET(10 MG) BY MOUTH DAILY 12/25/22   Leslye Peer, MD  magnesium oxide (MAG-OX) 400 MG tablet Take 400 mg by mouth 2 (two) times daily.    [provider]  Melatonin 10 MG CAPS Take 10 mg by mouth at bedtime.    [provider]  pantoprazole (PROTONIX) 40 MG tablet Take 1 tablet (40 mg total) by mouth daily. 11/27/22   Mansouraty, Netty Starring., MD  polyethylene glycol (MIRALAX / GLYCOLAX) 17 g packet Take 17 g by mouth daily.    [provider]  sertraline (ZOLOFT) 100 MG tablet TAKE 1 AND 1/2 TABLETS BY MOUTH EVERY DAY 08/30/22   [place], Betty G, MD  sucralfate (CARAFATE) 1 g tablet TAKE 1 TABLET BY MOUTH IN THE MORNING AND AT BEDTIME 03/29/23   Unk Lightning, PA  sucralfate (CARAFATE) 1 GM/10ML suspension Take 1 g by mouth daily as needed.    [provider]  temazepam (RESTORIL) 15 MG capsule TAKE 1 CAPSULE BY MOUTH AT BEDTIME AS NEEDED FOR SLEEP 08/30/22   [place], Betty G, MD  Tocilizumab (ACTEMRA IV) Inject into the vein every 30 (thirty) days.    [provider]  TURMERIC CURCUMIN  PO Take 3,000 mg by mouth daily.    [provider]  zinc gluconate 50 MG tablet Take 50 mg by mouth daily.    [provider]      Allergies    Tdap [tetanus-diphth-acell pertussis]; Tetanus toxoid, adsorbed; Hydroxychloroquine sulfate; and Covid-19 (mrna) vaccine (pfizer) [covid-19 (mrna) vaccine]    Review of Systems   Review of Systems  All other systems reviewed and are negative.   Physical Exam Updated Vital Signs BP 125/78 (BP Location: Right Arm)   Pulse 66   Temp 97.7 F (36.5 C) (Oral)   Resp 16   Ht 5\' 3"  (1.6 m)   Wt 58 kg   SpO2 99%   BMI 22.65 kg/m   Physical Exam Vitals and nursing note reviewed.  Constitutional:      General: She is not in acute distress.    Appearance: Normal appearance.  HENT:     Nose:     Comments: No appreciable epistaxis noted to bilateral nares. TTP noted to bilateral nares, more so to right nasal bone with palpable deformity.     Mouth/Throat:     Comments: Uvula midline without swelling. No posterior pharyngeal erythema or tonsillar exudate noted. Patent airway. Pt able to speak in clear complete sentences. Tolerating oral secretions. No blood noted to posterior oropharynx. Eyes:     General: No scleral icterus.    Extraocular Movements: Extraocular movements intact.  Cardiovascular:     Rate and Rhythm: Normal rate and regular rhythm.     Pulses: Normal pulses.     Heart sounds: Normal heart sounds.  Pulmonary:     Effort: Pulmonary effort is normal. No respiratory distress.     Breath sounds: Normal breath sounds.  Abdominal:     Palpations: Abdomen is soft. There is no mass.     Tenderness: There is no abdominal tenderness.  Musculoskeletal:        General: Normal range of motion.     Cervical back: Neck supple.     Comments: No spinal TTP. No TTP noted to musculature of back.  Skin:    General: Skin is warm and dry.     Findings: No rash.  Neurological:     Mental Status: She is alert.     Sensory: Sensation is intact.     Motor: Motor function is intact.     Comments: No focal neurological deficits. Negative pronator drift. Able to ambulate without assistance or difficulty. Strength and sensation intact to BUE and BLE. Grip strength 5/5 bilaterally.  Normal finger-nose testing.  Normal heel-to-shin testing.  Cranial nerves II through XII intact.  Psychiatric:        Behavior: Behavior normal.     ED Results / Procedures / Treatments   Labs (all labs ordered are listed, but only abnormal results are displayed) Labs Reviewed - No data to display  EKG None  Radiology No results  found.  Procedures Procedures    Medications Ordered in ED Medications - No data to display  ED Course/ Medical Decision Making/ A&P Clinical Course as of 05/05/23 1642  Sat May 05, 2023  1542 Re-evaluated and notes no chest pain at this time. Reviewed with patient imaging findings. Answered all available questions.  [SB]  [id number] Consult with cardiologist, Dr. Wyline Mood who reviewed patient's labs, imaging, EKG findings.  Notes the patient can follow-up in the outpatient setting regarding incidental elevated troponins. [SB]  1641 Discussed with patient and family at bedside regarding discharge treatment  plan. Patient has been ambulatory in the ED with her cane without difficulty or symptoms. Answered all available questions. Pt appears safe for discharge at this time.  [SB]    Clinical Course User Index [SB] Glennys Schorsch A, PA-C                             Medical Decision Making Amount and/or Complexity of Data Reviewed Labs: ordered. Radiology: ordered.   Pt with fall occurring PTA. Denies hitting their head, LOC, headache, vision changes. Vital signs patient afebrile, not tachycardic or hypoxic. On exam, patient with No appreciable epistaxis noted to bilateral nares. TTP noted to bilateral nares, more so to right nasal bone with palpable deformity. Uvula midline without swelling. No posterior pharyngeal erythema or tonsillar exudate noted. Patent airway. Pt able to speak in clear complete sentences. Tolerating oral secretions. No blood noted to posterior oropharynx. No focal neurological deficits. No spinal TTP. No TTP noted to musculature of back. No acute cardiovascular, respiratory, or abdominal exam findings. Differential diagnosis includes fracture, dislocation, herniation, intracranial abnormality, contusion, ACS, pneumonia, hypoglycemia, arrhythmia, electrolyte abnormality.   Labs:  I ordered, and personally interpreted labs.  The pertinent results include:   Initial troponin 26,  delta troponin at 46 Urinalysis unremarkable CBC unremarkable BMP with slightly decreased sodium at 133 otherwise unremarkable  Imaging: I ordered imaging studies including Chest x-ray CT head without CT cervical spine CT maxillofacial I independently visualized and interpreted imaging which showed: CT maxillofacial with 1. No acute intracranial abnormality. 2. Acute, comminuted, and mildly depressed fracture of the right nasal bone. 3. No acute fracture or subluxation of the cervical spine. No acute findings noted on chest x-ray today I agree with the radiologist interpretation  Consultations: I requested consultation with the Cardiologist, Dr. Wyline Mood and discussed lab and imaging findings as well as pertinent plan - they recommend: Follow-up in the outpatient setting regarding incidental findings with elevated troponins.   Disposition: Please do suspicious for fall, fracture of right nasal bone.  Also notable for incidental finding of elevated troponin.  Patient without chest pain throughout ED visit or prior to her ED visit prior to the fall.  EKG, troponins, labs reviewed by cardiologist, Dr. Wyline Mood who recommends patient can be followed up in the outpatient setting.  Doubt concerns for intracranial abnormality, electrolyte abnormality, hypoglycemia, anemia, acute cystitis. After consideration of the diagnostic results and the patients response to treatment, I feel that the patient would benefit from Discharge home.  Ambulatory referral for cardiology given today.  Supportive care and return precautions discussed with patient and significant other. Return precautions given to the patient including shortness of breath, worsening chest pain, or worsening headache. Appears safe for discharge at this time. Follow up as indicated in discharge paperwork.   This chart was dictated using voice recognition software, Dragon. Despite the best efforts of this provider to proofread and correct errors,  errors may still occur which can change documentation meaning.  Final Clinical Impression(s) / ED Diagnoses Final diagnoses:  Fall, initial encounter  Closed fracture of nasal bone, initial encounter  Elevated troponin    Rx / DC Orders ED Discharge Orders          Ordered    Ambulatory referral to Cardiology       Comments: If you have not heard from the Cardiology office within the next 72 hours please call [phone].   05/05/23 1639  Julious Langlois A, PA-C 05/05/23 1646    Melene Plan, DO 05/06/23 [phone]

## 2023-05-05 NOTE — ED Notes (Signed)
Paged Cards on call, Dr Wyline Mood @ [phone]

## 2023-05-05 NOTE — ED Notes (Signed)
Patient ambulated to bathroom without assistane Patient used her cane for support

## 2023-05-05 NOTE — ED Notes (Signed)
Discharge paperwork reviewed entirely with patient, including follow up care. Pain was under control. No prescriptions were called in, but all questions were addressed.  Pt verbalized understanding as well as all parties involved. No questions or concerns voiced at the time of discharge. No acute distress noted.   Pt ambulated out to PVA without incident or assistance. Reviewed followup procedure extensively.

## 2023-05-05 NOTE — Discharge Instructions (Addendum)
It was a pleasure taking care of you today!  Your CT scan showed concern for fracture (break in the bone) to your right nasal bone. Follow up with your ENT specialist, Dr. Jenne Pane within 1 week regarding todays ED visit. You may take over the counter 500 mg tylenol every 6 hours as needed for pain. You may place ice to the affected area up to 15 minutes at a time, ensure to place a barrier between your skin and ice. In the meantime ensure that you are following these recommendations:  Do not use straws Sneeze with your mouth open Do not blow your nose  Your troponin was slightly elevated in the ED today. An appointment will be set up for you with the Cardiologist, Dr. Wyline Mood. They will call you and set up a follow up appointment regarding todays ED visit. You may follow up with your primary care provider as needed. Return to the ED if you are experiencing increasing/worsening symptoms.

## 2023-05-05 NOTE — ED Notes (Signed)
ED Provider at bedside. 

## 2023-05-05 NOTE — ED Triage Notes (Addendum)
Patient tripped and fell. She hit her nose on a table. Swelling and bruising noted to patients nose. Her nose is bleeding at this time. She denied LOC.She denied taking a blood thinner at this time.

## 2023-05-05 NOTE — ED Notes (Signed)
Fall risk armband Fall risk sign on door

## 2023-05-08 ENCOUNTER — Ambulatory Visit: Payer: Medicare Other

## 2023-05-09 ENCOUNTER — Encounter (INDEPENDENT_AMBULATORY_CARE_PROVIDER_SITE_OTHER): Payer: Self-pay

## 2023-05-09 ENCOUNTER — Other Ambulatory Visit (INDEPENDENT_AMBULATORY_CARE_PROVIDER_SITE_OTHER): Payer: Medicare Other

## 2023-05-09 DIAGNOSIS — R739 Hyperglycemia, unspecified: Secondary | ICD-10-CM | POA: Diagnosis not present

## 2023-05-09 LAB — HEMOGLOBIN A1C: Hgb A1c MFr Bld: 5.4 % (ref 4.6–6.5)

## 2023-05-09 LAB — GLUCOSE, RANDOM: Glucose, Bld: 83 mg/dL (ref 70–99)

## 2023-05-11 DIAGNOSIS — S022XXA Fracture of nasal bones, initial encounter for closed fracture: Secondary | ICD-10-CM | POA: Diagnosis not present

## 2023-05-14 NOTE — Progress Notes (Unsigned)
Cardiology Office Note:   Date:  05/16/2023  NAME:  Jennifer Hull    MRN: [id number] DOB:  [birth year]   PCP:  [place], Betty G, MD  Cardiologist:  Reatha Harps, MD  Electrophysiologist:  None   Referring MD: Karenann Cai, PA-C   Chief Complaint  Patient presents with   New Patient (Initial Visit)    Patient is here for Elevated Troponin - patient denies any chest pain, SOB, and swelling. Meds reviewed verbally with patient    History of Present Illness:   Jennifer Hull is a 82 y.o. female with a hx of HLD, hypothyroidism, depression, RA who is being seen today for the evaluation of elevated troponin at the request of Blue, Soijett A, PA-C. Seen in ER 7/27 for fall. Found to have elevated troponin but no chest pain. EKG with nonspecific STT changes. She reports she fell at 10:30 in the morning.  Describes going to the kitchen and just falling.  She did not blackout she may have missed stepped.  She reports no chest pain or trouble breathing prior to the episode.  She does get dizzy throughout the day.  Occurs with position change.  She has no chest discomfort.  She can walk around the house without limitations.  She has never had a heart attack or stroke.  She is not diabetic.  She does not have high blood pressure.  She does have rheumatoid arthritis but this is treated.  In the ER she was found to have minimally elevated troponin but no chest pain.  Given lack of symptoms outpatient follow-up was recommended.  She does have hyperlipidemia and LDL is well-controlled.  She denies any chest discomfort or trouble breathing today.  CV examination is normal.  I have reviewed her recent chest CT.  This does show mild to moderate coronary calcium.  Lipids are at goal.  She does not smoke.  No alcohol.  No drugs.  She is married.  She has 1 daughter.  She is retired and no cardiac complaints.  Problem List  RA HLD -T chol 147, HDL 69, LDL 65, TG 60 Coronary calcifications on chest CT  Past  Medical History: Past Medical History:  Diagnosis Date   Anxiety    Arthritis    Chronic constipation    Depression    Hyperlipidemia    Hypothyroidism    Pneumonia    Rheumatoid arthritis (HCC) 2019   Temporal arteritis (HCC)    Thyroid disease     Past Surgical History: Past Surgical History:  Procedure Laterality Date   ABDOMINAL HYSTERECTOMY     BREAST EXCISIONAL BIOPSY Left    BREAST SURGERY     biopsy   COLONOSCOPY     JOINT REPLACEMENT     REVERSE SHOULDER ARTHROPLASTY Left 06/09/2021   Procedure: REVERSE SHOULDER ARTHROPLASTY;  Surgeon: Jones Broom, MD;  Location: [facility];  Service: Orthopedics;  Laterality: Left;   SHOULDER SURGERY Left 06/09/2021   TEMPORAL ARTERY BIOPSY / LIGATION Bilateral 2014   TONSILLECTOMY     TOTAL SHOULDER ARTHROPLASTY Right 11/22/2017   Procedure: RIGHT REVERSE TOTAL SHOULDER ARTHROPLASTY;  Surgeon: Jones Broom, MD;  Location: [facility];  Service: Orthopedics;  Laterality: Right;    Current Medications: Current Meds  Medication Sig   acetaminophen (TYLENOL) 500 MG tablet Take 1,000 mg by mouth every 8 (eight) hours as needed for moderate pain.   atorvastatin (LIPITOR) 20 MG tablet Take 0.5 tablets (10 mg total) by mouth daily.  bisacodyl (DULCOLAX) 10 MG suppository Every 2 days as needed.   Calcium Carb-Cholecalciferol (CALCIUM 600+D3 PO) Take 1 tablet by mouth daily with lunch.   cholecalciferol (VITAMIN D3) 25 MCG (1000 UNIT) tablet Take 4,000 Units by mouth daily.   fluticasone (FLONASE) 50 MCG/ACT nasal spray Place 2 sprays into both nostrils at bedtime.   folic acid (FOLVITE) 800 MCG tablet Take 800 mcg by mouth daily.   levothyroxine (SYNTHROID) 112 MCG tablet TAKE 1 TABLET(112 MCG) BY MOUTH DAILY BEFORE BREAKFAST   linaclotide (LINZESS) 290 MCG CAPS capsule Take 1 capsule (290 mcg total) by mouth daily before breakfast.   loratadine (CLARITIN) 10 MG tablet TAKE 1 TABLET(10 MG) BY MOUTH DAILY   magnesium oxide (MAG-OX) 400  MG tablet Take 400 mg by mouth 2 (two) times daily.   Melatonin 10 MG CAPS Take 10 mg by mouth at bedtime.   pantoprazole (PROTONIX) 40 MG tablet Take 1 tablet (40 mg total) by mouth daily.   polyethylene glycol (MIRALAX / GLYCOLAX) 17 g packet Take 17 g by mouth daily.   sertraline (ZOLOFT) 100 MG tablet TAKE 1 AND 1/2 TABLETS BY MOUTH EVERY DAY   temazepam (RESTORIL) 15 MG capsule TAKE 1 CAPSULE BY MOUTH AT BEDTIME AS NEEDED FOR SLEEP   Tocilizumab (ACTEMRA IV) Inject into the vein every 30 (thirty) days.   TURMERIC CURCUMIN PO Take 3,000 mg by mouth daily.   zinc gluconate 50 MG tablet Take 50 mg by mouth daily.     Allergies:    Tdap [tetanus-diphth-acell pertussis]; Tetanus toxoid, adsorbed; Hydroxychloroquine sulfate; and Covid-19 (mrna) vaccine (pfizer) [covid-19 (mrna) vaccine]   Social History: Social History   Socioeconomic History   Marital status: Married    Spouse name: Not on file   Number of children: 1   Years of education: Not on file   Highest education level: Not on file  Occupational History   Occupation: Nurse  Tobacco Use   Smoking status: Former    Current packs/day: 0.00    Average packs/day: 1.5 packs/day for 8.0 years (12.0 ttl pk-yrs)    Types: Cigarettes    Start date: 51    Quit date: 60    Years since quitting: 46.6   Smokeless tobacco: Never   Tobacco comments:    smoked infrequently when she was young; quit 82 yo   Vaping Use   Vaping status: Never Used  Substance and Sexual Activity   Alcohol use: No   Drug use: No   Sexual activity: Not Currently    Partners: Male    Birth control/protection: Surgical  Other Topics Concern   Not on file  Social History Narrative   Not on file   Social Determinants of Health   Financial Resource Strain: Low Risk  (04/04/2022)   Overall Financial Resource Strain (CARDIA)    Difficulty of Paying Living Expenses: Not hard at all  Food Insecurity: Low Risk  (05/11/2023)   Received from [facility]    Food vital sign    Within the past 12 months, you worried that your food would run out before you got money to buy more: Never true    Within the past 12 months, the food you bought just didn't last and you didn't have money to get more. : Never true  Transportation Needs: Not on file (05/11/2023)  Physical Activity: Inactive (04/04/2022)   Exercise Vital Sign    Days of Exercise per Week: 0 days    Minutes of Exercise per  Session: 0 min  Stress: No Stress Concern Present (04/04/2022)   [employer] of Occupational Health - Occupational Stress Questionnaire    Feeling of Stress : Not at all  Social Connections: Not on file     Family History: The patient's family history includes Breast cancer in her maternal aunt; Heart disease in her father; Kidney cancer in her mother. There is no history of Colon cancer, Esophageal cancer, Rectal cancer, Stomach cancer, Inflammatory bowel disease, Liver disease, or Pancreatic cancer.  ROS:   All other ROS reviewed and negative. Pertinent positives noted in the HPI.     EKGs/Labs/Other Studies Reviewed:   The following studies were personally reviewed by me today:  EKG:  EKG is not ordered today.  EKG from ER dated 05/05/2023 shows normal sinus rhythm with nonspecific ST-T changes.      Recent Labs: 02/23/2023: TSH 0.781 05/05/2023: BUN 21; Creatinine, Ser 0.63; Hemoglobin 13.2; Platelets 184; Potassium 4.0; Sodium 133   Recent Lipid Panel    Component Value Date/Time   CHOL 147 01/19/2022 0830   TRIG 60.0 01/19/2022 0830   HDL 69.50 01/19/2022 0830   CHOLHDL 2 01/19/2022 0830   VLDL 12.0 01/19/2022 0830   LDLCALC 65 01/19/2022 0830   LDLDIRECT 67.0 01/30/2020 1458    Physical Exam:   VS:  BP 110/60 (BP Location: Left Arm, Patient Position: Sitting, Cuff Size: Normal)   Pulse 63   Ht 5\' 3"  (1.6 m)   Wt 130 lb (59 kg)   SpO2 97%   BMI 23.03 kg/m    Wt Readings from Last 3 Encounters:  05/16/23 130 lb (59 kg)  05/05/23 127 lb  13.9 oz (58 kg)  04/10/23 129 lb (58.5 kg)    General: Well nourished, well developed, in no acute distress Head: Atraumatic, normal size  Eyes: PEERLA, EOMI  Neck: Supple, no JVD Endocrine: No thryomegaly Cardiac: Normal S1, S2; RRR; no murmurs, rubs, or gallops Lungs: Clear to auscultation bilaterally, no wheezing, rhonchi or rales  Abd: Soft, nontender, no hepatomegaly  Ext: No edema, pulses 2+ Musculoskeletal: No deformities, BUE and BLE strength normal and equal Skin: Warm and dry, no rashes   Neuro: Alert and oriented to person, place, time, and situation, CNII-XII grossly intact, no focal deficits  Psych: Normal mood and affect   ASSESSMENT:   Jennifer Hull is a 82 y.o. female who presents for the following: 1. Elevated troponin   2. 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 Dizziness     PLAN:   1. Elevated troponin 2. 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  Troponins were minimally elevated but she had no chest discomfort.  She does have a recent chest CT with coronary calcium but she is on a statin.  Cholesterol levels are at goal.  Not on aspirin but not entirely necessary.  She reports absolutely no chest discomfort or trouble breathing.  Her dizziness is likely related to dehydration.  See discussion below.  Given her lack of symptoms we discussed obtaining an echocardiogram.  As long as this is normal and without wall motion abnormality I would recommend to get stress testing.  Unclear how this would change management given her lack of symptoms.  For now she should continue her Lipitor and continue to remain active.  We will notify her of her results by phone.  If they are abnormal we could consider a stress test but I do not  believe this is actually necessary at this point.  3. Dizziness -Likely related to dehydration.  Reports dizziness with position change.  She is drinking maybe  1 to 2 glasses of water per day.  CV exam normal.  I have recommended she drink at least 4 to 6 glasses/day.  We will obtain an echo as above.  Hopefully symptoms will improve with hydration.  Disposition: Return if symptoms worsen or fail to improve.  Medication Adjustments/Labs and Tests Ordered: Current medicines are reviewed at length with the patient today.  Concerns regarding medicines are outlined above.  Orders Placed This Encounter  Procedures   ECHOCARDIOGRAM COMPLETE   No orders of the defined types were placed in this encounter.  Patient Instructions  Medication Instructions:  No changes *If you need a refill on your cardiac medications before your next appointment, please call your pharmacy*   Lab Work: No labs If you have labs (blood work) drawn today and your tests are completely normal, you will receive your results only by: MyChart Message (if you have MyChart) OR A paper copy in the mail If you have any lab test that is abnormal or we need to change your treatment, we will call you to review the results.   Testing/Procedures:[address] Your physician has requested that you have an echocardiogram. Echocardiography is a painless test that uses sound waves to create images of your heart. It provides your doctor with information about the size and shape of your heart and how well your heart's chambers and valves are working. This procedure takes approximately one hour. There are no restrictions for this procedure. Please do NOT wear cologne, perfume, aftershave, or lotions (deodorant is allowed). Please arrive 15 minutes prior to your appointment time.    Follow-Up: At [facility], you and your health needs are our priority.  As part of our continuing mission to provide you with exceptional heart care, we have created designated Provider Care Teams.  These Care Teams include your primary Cardiologist (physician) and Advanced Practice Providers  (APPs -  Physician Assistants and Nurse Practitioners) who all work together to provide you with the care you need, when you need it.    Your next appointment:   FOLLOW UP AS NEEDED.  Provider:   Reatha Harps, MD     Other Instructions FOR DIZZINESS DRINK 4-6 GLASSES OF WATER A DAY.     Signed, Lenna Gilford. Flora Lipps, MD, [facility]  [facility]  [address] [place], [place] [address] [phone]  05/16/2023 9:56 AM

## 2023-05-15 ENCOUNTER — Ambulatory Visit: Payer: Medicare Other

## 2023-05-16 ENCOUNTER — Ambulatory Visit: Payer: Medicare Other | Attending: Cardiovascular Disease | Admitting: Cardiovascular Disease

## 2023-05-16 ENCOUNTER — Encounter: Payer: Self-pay | Admitting: Cardiovascular Disease

## 2023-05-16 VITALS — BP 110/60 | HR 63 | Ht 63.0 in | Wt 130.0 lb

## 2023-05-16 DIAGNOSIS — R7989 Other specified abnormal findings of blood chemistry: Secondary | ICD-10-CM | POA: Insufficient documentation

## 2023-05-16 DIAGNOSIS — I2510 Atherosclerotic heart disease of native coronary artery without angina pectoris: Secondary | ICD-10-CM | POA: Diagnosis not present

## 2023-05-16 DIAGNOSIS — R42 Dizziness and giddiness: Secondary | ICD-10-CM | POA: Diagnosis not present

## 2023-05-16 NOTE — Patient Instructions (Signed)
Medication Instructions:  No changes *If you need a refill on your cardiac medications before your next appointment, please call your pharmacy*   Lab Work: No labs If you have labs (blood work) drawn today and your tests are completely normal, you will receive your results only by: MyChart Message (if you have MyChart) OR A paper copy in the mail If you have any lab test that is abnormal or we need to change your treatment, we will call you to review the results.   Testing/Procedures:[address] Your physician has requested that you have an echocardiogram. Echocardiography is a painless test that uses sound waves to create images of your heart. It provides your doctor with information about the size and shape of your heart and how well your heart's chambers and valves are working. This procedure takes approximately one hour. There are no restrictions for this procedure. Please do NOT wear cologne, perfume, aftershave, or lotions (deodorant is allowed). Please arrive 15 minutes prior to your appointment time.    Follow-Up: At [facility], you and your health needs are our priority.  As part of our continuing mission to provide you with exceptional heart care, we have created designated Provider Care Teams.  These Care Teams include your primary Cardiologist (physician) and Advanced Practice Providers (APPs -  Physician Assistants and Nurse Practitioners) who all work together to provide you with the care you need, when you need it.    Your next appointment:   FOLLOW UP AS NEEDED.  Provider:   Reatha Harps, MD     Other Instructions FOR DIZZINESS DRINK 4-6 GLASSES OF WATER A DAY.

## 2023-05-21 DIAGNOSIS — M0579 Rheumatoid arthritis with rheumatoid factor of multiple sites without organ or systems involvement: Secondary | ICD-10-CM | POA: Diagnosis not present

## 2023-05-23 ENCOUNTER — Other Ambulatory Visit: Payer: Self-pay | Admitting: Gastroenterology

## 2023-05-23 ENCOUNTER — Other Ambulatory Visit: Payer: Self-pay | Admitting: Family Medicine

## 2023-05-23 DIAGNOSIS — M069 Rheumatoid arthritis, unspecified: Secondary | ICD-10-CM | POA: Diagnosis not present

## 2023-05-23 DIAGNOSIS — F411 Generalized anxiety disorder: Secondary | ICD-10-CM

## 2023-05-23 DIAGNOSIS — H00022 Hordeolum internum right lower eyelid: Secondary | ICD-10-CM | POA: Diagnosis not present

## 2023-05-23 DIAGNOSIS — D492 Neoplasm of unspecified behavior of bone, soft tissue, and skin: Secondary | ICD-10-CM | POA: Diagnosis not present

## 2023-05-23 DIAGNOSIS — M316 Other giant cell arteritis: Secondary | ICD-10-CM | POA: Diagnosis not present

## 2023-05-23 DIAGNOSIS — F331 Major depressive disorder, recurrent, moderate: Secondary | ICD-10-CM

## 2023-05-23 DIAGNOSIS — Z961 Presence of intraocular lens: Secondary | ICD-10-CM | POA: Diagnosis not present

## 2023-05-23 DIAGNOSIS — H1045 Other chronic allergic conjunctivitis: Secondary | ICD-10-CM | POA: Diagnosis not present

## 2023-05-23 DIAGNOSIS — H04123 Dry eye syndrome of bilateral lacrimal glands: Secondary | ICD-10-CM | POA: Diagnosis not present

## 2023-05-28 DIAGNOSIS — H6123 Impacted cerumen, bilateral: Secondary | ICD-10-CM | POA: Diagnosis not present

## 2023-06-06 ENCOUNTER — Ambulatory Visit (HOSPITAL_COMMUNITY): Payer: Medicare Other | Attending: Cardiovascular Disease

## 2023-06-06 DIAGNOSIS — I2510 Atherosclerotic heart disease of native coronary artery without angina pectoris: Secondary | ICD-10-CM | POA: Insufficient documentation

## 2023-06-06 DIAGNOSIS — R7989 Other specified abnormal findings of blood chemistry: Secondary | ICD-10-CM | POA: Insufficient documentation

## 2023-06-06 LAB — ECHOCARDIOGRAM COMPLETE
Area-P 1/2: 3.12 cm2
P 1/2 time: 579 msec
S' Lateral: 2.6 cm

## 2023-06-13 ENCOUNTER — Ambulatory Visit: Payer: Medicare Other | Admitting: Gastroenterology

## 2023-06-19 DIAGNOSIS — M316 Other giant cell arteritis: Secondary | ICD-10-CM | POA: Diagnosis not present

## 2023-06-19 DIAGNOSIS — M1991 Primary osteoarthritis, unspecified site: Secondary | ICD-10-CM | POA: Diagnosis not present

## 2023-06-19 DIAGNOSIS — M0589 Other rheumatoid arthritis with rheumatoid factor of multiple sites: Secondary | ICD-10-CM | POA: Diagnosis not present

## 2023-06-19 DIAGNOSIS — Z6822 Body mass index (BMI) 22.0-22.9, adult: Secondary | ICD-10-CM | POA: Diagnosis not present

## 2023-06-19 DIAGNOSIS — M5136 Other intervertebral disc degeneration, lumbar region: Secondary | ICD-10-CM | POA: Diagnosis not present

## 2023-06-19 DIAGNOSIS — R7989 Other specified abnormal findings of blood chemistry: Secondary | ICD-10-CM | POA: Diagnosis not present

## 2023-06-19 DIAGNOSIS — Z79899 Other long term (current) drug therapy: Secondary | ICD-10-CM | POA: Diagnosis not present

## 2023-06-19 DIAGNOSIS — R5383 Other fatigue: Secondary | ICD-10-CM | POA: Diagnosis not present

## 2023-06-19 DIAGNOSIS — M8589 Other specified disorders of bone density and structure, multiple sites: Secondary | ICD-10-CM | POA: Diagnosis not present

## 2023-06-22 DIAGNOSIS — M0579 Rheumatoid arthritis with rheumatoid factor of multiple sites without organ or systems involvement: Secondary | ICD-10-CM | POA: Diagnosis not present

## 2023-06-22 DIAGNOSIS — Z79899 Other long term (current) drug therapy: Secondary | ICD-10-CM | POA: Diagnosis not present

## 2023-06-22 DIAGNOSIS — R5383 Other fatigue: Secondary | ICD-10-CM | POA: Diagnosis not present

## 2023-07-05 ENCOUNTER — Telehealth: Payer: Self-pay | Admitting: Gastroenterology

## 2023-07-05 DIAGNOSIS — M47816 Spondylosis without myelopathy or radiculopathy, lumbar region: Secondary | ICD-10-CM | POA: Diagnosis not present

## 2023-07-05 DIAGNOSIS — M5416 Radiculopathy, lumbar region: Secondary | ICD-10-CM | POA: Diagnosis not present

## 2023-07-05 DIAGNOSIS — M461 Sacroiliitis, not elsewhere classified: Secondary | ICD-10-CM | POA: Diagnosis not present

## 2023-07-05 NOTE — Telephone Encounter (Signed)
Inbound call from patient requesting a sooner apt with either provider or PA, sooner than January 2025. Pleas advise.   Patient declined to schedule appointment with me   Thank you

## 2023-07-06 NOTE — Progress Notes (Unsigned)
[name] [address] [place] [address] Phone: [phone] Subjective:   INadine Counts, am serving as a scribe for Dr. Antoine Primas.  I'm seeing this patient by the request  of:  [place], Betty G, MD  CC: Bilateral wrist pain  [id number]  04/10/2023 Has had B12 deficiency previously and given injection today.  Tolerated the procedure well.  Can do monthly if necessary.     Significant decrease in size of the nodes at this moment.  Feeling significantly better.  Will give patient another B12 injection which made significant improvement as well.  Discussed exercises   Bracing at night as necessary.  Follow-up with me again in 3 to 4 months     Updated 07/10/2023 Shealeigh Dunstan Simmering is a 82 y.o. female coming in with complaint of B carpal tunnel. Numbness and tingling when using hands. Injections do help, but doesn't last long.  Patient states that it continues to give her discomfort and pain and affecting daily activities such as dressing.  Waking her up at night, medications do not seem to be helping anymore.       Past Medical History:  Diagnosis Date   Anxiety    Arthritis    Chronic constipation    Depression    Hyperlipidemia    Hypothyroidism    Pneumonia    Rheumatoid arthritis (HCC) 2019   Temporal arteritis (HCC)    Thyroid disease    Past Surgical History:  Procedure Laterality Date   ABDOMINAL HYSTERECTOMY     BREAST EXCISIONAL BIOPSY Left    BREAST SURGERY     biopsy   COLONOSCOPY     JOINT REPLACEMENT     REVERSE SHOULDER ARTHROPLASTY Left 06/09/2021   Procedure: REVERSE SHOULDER ARTHROPLASTY;  Surgeon: Jones Broom, MD;  Location: [facility];  Service: Orthopedics;  Laterality: Left;   SHOULDER SURGERY Left 06/09/2021   TEMPORAL ARTERY BIOPSY / LIGATION Bilateral 2014   TONSILLECTOMY     TOTAL SHOULDER ARTHROPLASTY Right 11/22/2017   Procedure: RIGHT REVERSE TOTAL SHOULDER ARTHROPLASTY;  Surgeon: Jones Broom, MD;  Location: [facility];  Service: Orthopedics;  Laterality: Right;   Social History   Socioeconomic History   Marital status: Married    Spouse name: Not on file   Number of children: 1   Years of education: Not on file   Highest education level: Not on file  Occupational History   Occupation: Nurse  Tobacco Use   Smoking status: Former    Current packs/day: 0.00    Average packs/day: 1.5 packs/day for 8.0 years (12.0 ttl pk-yrs)    Types: Cigarettes    Start date: 79    Quit date: 24    Years since quitting: 46.7   Smokeless tobacco: Never   Tobacco comments:    smoked infrequently when she was young; quit 82 yo   Vaping Use   Vaping status: Never Used  Substance and Sexual Activity   Alcohol use: No   Drug use: No   Sexual activity: Not Currently    Partners: Male    Birth control/protection: Surgical  Other Topics Concern   Not on file  Social History Narrative   Not on file   Social Determinants of Health   Financial Resource Strain: Low Risk  (04/04/2022)   Overall Financial Resource Strain (CARDIA)    Difficulty of Paying Living Expenses: Not hard at all  Food Insecurity: Low Risk  (05/28/2023)   Received from [facility]   Hunger Vital Sign    Worried About Running Out of Food in the Last Year: Never true    Ran Out of Food in the Last Year: Never true  Transportation Needs: Not on file (05/28/2023)  Physical Activity: Inactive (04/04/2022)   Exercise Vital Sign    Days of Exercise per Week: 0 days    Minutes of Exercise per Session: 0 min  Stress: No Stress Concern Present (04/04/2022)   [employer] of Occupational Health - Occupational Stress Questionnaire    Feeling of Stress : Not at all  Social Connections: Not on file   Allergies  Allergen Reactions   Tdap [Tetanus-Diphth-Acell Pertussis] Other (See Comments)    SEVERE ALLERGIC REACTIONS (NECK STIFFNESS/HIGH FEVER/ETC)   Tetanus Toxoid, Adsorbed     Neck stiffness, and high  fever Other reaction(s): Other (See Comments) Neck stiffness, and high fever   Hydroxychloroquine Sulfate Rash   Covid-19 (Mrna) Vaccine Proofreader) [Covid-19 (Mrna) Vaccine] Rash   Family History  Problem Relation Age of Onset   Kidney cancer Mother    Heart disease Father    Breast cancer Maternal Aunt    Colon cancer Neg Hx    Esophageal cancer Neg Hx    Rectal cancer Neg Hx    Stomach cancer Neg Hx    Inflammatory bowel disease Neg Hx    Liver disease Neg Hx    Pancreatic cancer Neg Hx     Current Outpatient Medications (Endocrine & Metabolic):    levothyroxine (SYNTHROID) 112 MCG tablet, TAKE 1 TABLET(112 MCG) BY MOUTH DAILY BEFORE BREAKFAST  Current Outpatient Medications (Cardiovascular):    atorvastatin (LIPITOR) 20 MG tablet, Take 0.5 tablets (10 mg total) by mouth daily.  Current Outpatient Medications (Respiratory):    fluticasone (FLONASE) 50 MCG/ACT nasal spray, Place 2 sprays into both nostrils at bedtime.   loratadine (CLARITIN) 10 MG tablet, TAKE 1 TABLET(10 MG) BY MOUTH DAILY  Current Outpatient Medications (Analgesics):    acetaminophen (TYLENOL) 500 MG tablet, Take 1,000 mg by mouth every 8 (eight) hours as needed for moderate pain.   Tocilizumab (ACTEMRA IV), Inject into the vein every 30 (thirty) days.  Current Outpatient Medications (Hematological):    folic acid (FOLVITE) 800 MCG tablet, Take 800 mcg by mouth daily.  Current Outpatient Medications (Other):    bisacodyl (DULCOLAX) 10 MG suppository, Every 2 days as needed.   Calcium Carb-Cholecalciferol (CALCIUM 600+D3 PO), Take 1 tablet by mouth daily with lunch.   cholecalciferol (VITAMIN D3) 25 MCG (1000 UNIT) tablet, Take 4,000 Units by mouth daily.   linaclotide (LINZESS) 290 MCG CAPS capsule, Take 1 capsule (290 mcg total) by mouth daily before breakfast.   magnesium oxide (MAG-OX) 400 MG tablet, Take 400 mg by mouth 2 (two) times daily.   Melatonin 10 MG CAPS, Take 10 mg by mouth at bedtime.    pantoprazole (PROTONIX) 40 MG tablet, TAKE 1 TABLET(40 MG) BY MOUTH DAILY   polyethylene glycol (MIRALAX / GLYCOLAX) 17 g packet, Take 17 g by mouth daily.   sertraline (ZOLOFT) 100 MG tablet, TAKE 1 AND 1/2 TABLETS BY MOUTH EVERY DAY   temazepam (RESTORIL) 15 MG capsule, TAKE 1 CAPSULE BY MOUTH AT BEDTIME AS NEEDED FOR SLEEP   TURMERIC CURCUMIN PO, Take 3,000 mg by mouth daily.   zinc gluconate 50 MG tablet, Take 50 mg by mouth daily.   Reviewed prior external information including notes and imaging from  primary care provider As well as notes that were available  from care everywhere and other healthcare systems.  Past medical history, social, surgical and family history all reviewed in electronic medical record.  No pertanent information unless stated regarding to the chief complaint.   Review of Systems:  No headache, visual changes, nausea, vomiting, diarrhea, constipation, dizziness, abdominal pain, skin rash, fevers, chills, night sweats, weight loss, swollen lymph nodes, body aches, joint swelling, chest pain, shortness of breath, mood changes. POSITIVE muscle aches  Objective  Blood pressure 114/60, pulse 71, height 5\' 3"  (1.6 m), weight 129 lb (58.5 kg), SpO2 96%.   General: No apparent distress alert and oriented x3 mood and affect normal, dressed appropriately.  HEENT: Pupils equal, extraocular movements intact  Respiratory: Patient's speak in full sentences and does not appear short of breath  Cardiovascular: No lower extremity edema, non tender, no erythema  Arthritic changes of multiple joints with patient does have significant difficulty with movement of the wrist.  Positive Tinel's sign.  No thenar eminence wasting but does have arthritic changes of the CMC joint.  Limited muscular skeletal ultrasound was performed and interpreted by Antoine Primas, M  Significant inflammation especially of the right median nerve with hypoechoic changes noted on ultrasound.  Patient does  have compression with the vernacular it appears as well.  Moderate dilatation of the median nerve with hypoechoic changes on the left side. 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

## 2023-07-06 NOTE — Telephone Encounter (Signed)
The pt states that she has a form for her [employer] that needs to be completed for [employer].  She will complete her part and drop off the physician section for Dr Meridee Score to complete.

## 2023-07-08 ENCOUNTER — Other Ambulatory Visit: Payer: Self-pay | Admitting: Gastroenterology

## 2023-07-10 ENCOUNTER — Other Ambulatory Visit: Payer: Self-pay

## 2023-07-10 ENCOUNTER — Encounter: Payer: Self-pay | Admitting: Family Medicine

## 2023-07-10 ENCOUNTER — Ambulatory Visit (INDEPENDENT_AMBULATORY_CARE_PROVIDER_SITE_OTHER): Payer: Medicare Other | Admitting: Family Medicine

## 2023-07-10 VITALS — BP 114/60 | HR 71 | Ht 63.0 in | Wt 129.0 lb

## 2023-07-10 DIAGNOSIS — G5603 Carpal tunnel syndrome, bilateral upper limbs: Secondary | ICD-10-CM | POA: Diagnosis not present

## 2023-07-10 NOTE — Patient Instructions (Signed)
Referral to Dr. Amanda Pea We are here if you have questions

## 2023-07-10 NOTE — Assessment & Plan Note (Signed)
Patient did respond initially to the first injections in May but continued to have some difficulty.  At this point patient states that it is causing more discomfort on a daily basis.  Starting to affect such things is even dressing.  Patient has failed 2 injections at this time.  Ultrasound shows dilatation of the median nerve with significant tightness of the retinaculum.  Will send to hand surgery for further evaluation and treatment.

## 2023-07-16 ENCOUNTER — Encounter: Payer: Self-pay | Admitting: Physician Assistant

## 2023-07-16 ENCOUNTER — Ambulatory Visit (INDEPENDENT_AMBULATORY_CARE_PROVIDER_SITE_OTHER): Payer: Medicare Other | Admitting: Physician Assistant

## 2023-07-16 VITALS — BP 98/60 | HR 67 | Ht 63.0 in | Wt 130.0 lb

## 2023-07-16 DIAGNOSIS — R6881 Early satiety: Secondary | ICD-10-CM | POA: Diagnosis not present

## 2023-07-16 DIAGNOSIS — K5909 Other constipation: Secondary | ICD-10-CM | POA: Diagnosis not present

## 2023-07-16 DIAGNOSIS — R1013 Epigastric pain: Secondary | ICD-10-CM

## 2023-07-16 MED ORDER — PANTOPRAZOLE SODIUM 40 MG PO TBEC: 40.0000 mg | DELAYED_RELEASE_TABLET | Freq: Two times a day (BID) | ORAL | 3 refills | Status: DC

## 2023-07-16 NOTE — Progress Notes (Signed)
Chief Complaint: Abdominal Pain  HPI:    Jennifer Hull is an 82 y/o Caucasian female with a past medical history significant for hypothyroidism, hyperlipidemia, MDD, anxiety, arthritis, temporal arteritis, hiatal hernia, colon polyps, CIC/IBS-C and hemorrhoids, known to Dr. Meridee Score, who presents to clinic today with a complaint of abdominal pain.    06/21/2022 patient seen in clinic by Dr. Meridee Score.  That time following up.  She continues to struggle with constipation.  She was given samples of Ibsrela which was not effective.  Her regimen at that time had continued on Trulance daily and MiraLAX once a day with Dulcolax every 5 days and a glycerin suppository.  Also new upper abdominal pain.  She was given a GI cocktail and Carafate at the urgent care with improvement.  Discussed finding of mild gastritis on EGD.  Refilled Carafate twice daily.  Given a trial Linzess 290.  Recommended abdominal ultrasound.    06/29/2022 abdominal ultrasound with fatty liver and 8 mm cyst and otherwise normal.  Discussed possible CT scan if continues with pain.    02/24/2023 Dr. Meridee Score was okay with her taking a half of a bowel prep once a month is a good cleanout.  Recommended Anusol suppositories for hemorrhoids.  Gust possible banding.    Today, patient presents to clinic and tells me that lately she has been having episodes of epigastric discomfort as well as bloating and early satiety.  These seem to come on every month or so and last for about a week and then she would be okay for a couple of weeks and then will come back.  When these episodes come on she starts using Carafate sometimes 3 times a day and eating manga root as well as drinking chamomile tea, eventually things will settle down but she is trying to eliminate these episodes or least decrease their frequency.  Still on Pantoprazole 40 mg daily.    Also discussed his bowel movements, these are her normal which is still somewhat constipated and  requiring multiple laxatives.  She requests instructions on a MiraLAX bowel prep if she needs it occasionally and wants to make sure we have faxed back patient assistance for Linzess.    Denies fever, chills or blood in her stool.  GI procedures: July 2023 EGD - No gross lesions in esophagus. Z-line irregular, 34 cm from the incisors. - 2 cm hiatal hernia. - Erythematous mucosa in the stomach. Biopsied. - No gross lesions in the duodenal bulb, in the first portion of the duodenum and in the second portion of the duodenum.   July 2023 colonoscopy - Hemorrhoids found on digital rectal exam. - There was significant looping of the colon. - Four 3 to 10 mm polyps in the transverse colon, at the hepatic flexure and in the ascending colon, removed with a cold snare. Resected and retrieved. - Normal mucosa in the entire examined colon otherwise. - Non-bleeding non-thrombosed external and internal hemorrhoids.   Pathology Diagnosis 1. Surgical [P], gastric biopsies FRAGMENTS OF GASTRIC MUCOSA WITH MINIMAL INFLAMMATION. NO DEFINITE INTESTINAL METAPLASIA, H. PYLORI-LIKE ORGANISMS, DYSPLASIA OR MALIGNANCY IS SEEN ON H&E STAIN. 2. Surgical [P], colon, transverse and ascending, polyp (4) FINDINGS CONSISTENT WITH TUBULAR ADENOMA X 2 AND SESSILE SERRATED POLYP X 2. CLINICAL AND ENDOSCOPIC CORRELATION IS REQUIRED.  Past Medical History:  Diagnosis Date   Anxiety    Arthritis    Chronic constipation    Depression    Hyperlipidemia    Hypothyroidism    Pneumonia  Rheumatoid arthritis (HCC) 2019   Temporal arteritis (HCC)    Thyroid disease     Past Surgical History:  Procedure Laterality Date   ABDOMINAL HYSTERECTOMY     BREAST EXCISIONAL BIOPSY Left    BREAST SURGERY     biopsy   COLONOSCOPY     JOINT REPLACEMENT     REVERSE SHOULDER ARTHROPLASTY Left 06/09/2021   Procedure: REVERSE SHOULDER ARTHROPLASTY;  Surgeon: Jones Broom, MD;  Location: [facility];  Service: Orthopedics;   Laterality: Left;   SHOULDER SURGERY Left 06/09/2021   TEMPORAL ARTERY BIOPSY / LIGATION Bilateral 2014   TONSILLECTOMY     TOTAL SHOULDER ARTHROPLASTY Right 11/22/2017   Procedure: RIGHT REVERSE TOTAL SHOULDER ARTHROPLASTY;  Surgeon: Jones Broom, MD;  Location: [facility];  Service: Orthopedics;  Laterality: Right;    Current Outpatient Medications  Medication Sig Dispense Refill   acetaminophen (TYLENOL) 500 MG tablet Take 1,000 mg by mouth every 8 (eight) hours as needed for moderate pain.     atorvastatin (LIPITOR) 20 MG tablet Take 0.5 tablets (10 mg total) by mouth daily. 90 tablet 3   bisacodyl (DULCOLAX) 10 MG suppository Every 2 days as needed. 30 suppository 1   Calcium Carb-Cholecalciferol (CALCIUM 600+D3 PO) Take 1 tablet by mouth daily with lunch.     cholecalciferol (VITAMIN D3) 25 MCG (1000 UNIT) tablet Take 4,000 Units by mouth daily.     fluticasone (FLONASE) 50 MCG/ACT nasal spray Place 2 sprays into both nostrils at bedtime.     folic acid (FOLVITE) 800 MCG tablet Take 800 mcg by mouth daily.     levothyroxine (SYNTHROID) 112 MCG tablet TAKE 1 TABLET(112 MCG) BY MOUTH DAILY BEFORE BREAKFAST 90 tablet 2   linaclotide (LINZESS) 290 MCG CAPS capsule Take 1 capsule (290 mcg total) by mouth daily before breakfast. 30 capsule 6   loratadine (CLARITIN) 10 MG tablet TAKE 1 TABLET(10 MG) BY MOUTH DAILY 30 tablet 11   magnesium oxide (MAG-OX) 400 MG tablet Take 400 mg by mouth 2 (two) times daily.     Melatonin 10 MG CAPS Take 10 mg by mouth at bedtime.     pantoprazole (PROTONIX) 40 MG tablet TAKE 1 TABLET(40 MG) BY MOUTH DAILY 90 tablet 1   polyethylene glycol (MIRALAX / GLYCOLAX) 17 g packet Take 17 g by mouth daily.     sertraline (ZOLOFT) 100 MG tablet TAKE 1 AND 1/2 TABLETS BY MOUTH EVERY DAY 135 tablet 2   temazepam (RESTORIL) 15 MG capsule TAKE 1 CAPSULE BY MOUTH AT BEDTIME AS NEEDED FOR SLEEP 30 capsule 3   Tocilizumab (ACTEMRA IV) Inject into the vein every 30 (thirty)  days.     TURMERIC CURCUMIN PO Take 3,000 mg by mouth daily.     zinc gluconate 50 MG tablet Take 50 mg by mouth daily.     No current facility-administered medications for this visit.    Allergies as of 07/16/2023 - Review Complete 07/10/2023  Allergen Reaction Noted   Tdap [tetanus-diphth-acell pertussis] Other (See Comments) 07/27/2016   Tetanus toxoid, adsorbed  12/07/2017   Hydroxychloroquine sulfate Rash 06/02/2021   Covid-19 (mrna) vaccine (pfizer) [covid-19 (mrna) vaccine] Rash 05/13/2021    Family History  Problem Relation Age of Onset   Kidney cancer Mother    Heart disease Father    Breast cancer Maternal Aunt    Colon cancer Neg Hx    Esophageal cancer Neg Hx    Rectal cancer Neg Hx    Stomach cancer Neg Hx  Inflammatory bowel disease Neg Hx    Liver disease Neg Hx    Pancreatic cancer Neg Hx     Social History   Socioeconomic History   Marital status: Married    Spouse name: Not on file   Number of children: 1   Years of education: Not on file   Highest education level: Not on file  Occupational History   Occupation: Nurse  Tobacco Use   Smoking status: Former    Current packs/day: 0.00    Average packs/day: 1.5 packs/day for 8.0 years (12.0 ttl pk-yrs)    Types: Cigarettes    Start date: 73    Quit date: 76    Years since quitting: 46.7   Smokeless tobacco: Never   Tobacco comments:    smoked infrequently when she was young; quit 82 yo   Vaping Use   Vaping status: Never Used  Substance and Sexual Activity   Alcohol use: No   Drug use: No   Sexual activity: Not Currently    Partners: Male    Birth control/protection: Surgical  Other Topics Concern   Not on file  Social History Narrative   Not on file   Social Determinants of Health   Financial Resource Strain: Low Risk  (04/04/2022)   Overall Financial Resource Strain (CARDIA)    Difficulty of Paying Living Expenses: Not hard at all  Food Insecurity: Low Risk  (05/28/2023)    Received from [facility]   Hunger Vital Sign    Worried About Running Out of Food in the Last Year: Never true    Ran Out of Food in the Last Year: Never true  Transportation Needs: Not on file (05/28/2023)  Physical Activity: Inactive (04/04/2022)   Exercise Vital Sign    Days of Exercise per Week: 0 days    Minutes of Exercise per Session: 0 min  Stress: No Stress Concern Present (04/04/2022)   [employer] of Occupational Health - Occupational Stress Questionnaire    Feeling of Stress : Not at all  Social Connections: Not on file  Intimate Partner Violence: Not on file    Review of Systems:    Constitutional: No weight loss, fever or chills Cardiovascular: No chest pain   Respiratory: No SOB  Gastrointestinal: See HPI and otherwise negative   Physical Exam:  Vital signs: BP 98/60   Pulse 67   Ht 5\' 3"  (1.6 m)   Wt 130 lb (59 kg)   BMI 23.03 kg/m    Constitutional:   Pleasant Elderly Caucasian female appears to be in NAD, Well developed, Well nourished, alert and cooperative Respiratory: Respirations even and unlabored. Lungs clear to auscultation bilaterally.   No wheezes, crackles, or rhonchi.  Cardiovascular: Normal S1, S2. No MRG. Regular rate and rhythm. No peripheral edema, cyanosis or pallor.  Gastrointestinal:  Soft, nondistended, nontender. No rebound or guarding. Normal bowel sounds. No appreciable masses or hepatomegaly. Rectal:  Not performed.  Psychiatric: Oriented to person, place and time. Demonstrates good judgement and reason without abnormal affect or behaviors.  RELEVANT LABS AND IMAGING: CBC    Component Value Date/Time   WBC 5.3 05/05/2023 1313   RBC 4.14 05/05/2023 1313   HGB 13.2 05/05/2023 1313   HCT 37.9 05/05/2023 1313   PLT 184 05/05/2023 1313   MCV 91.5 05/05/2023 1313   MCH 31.9 05/05/2023 1313   MCHC 34.8 05/05/2023 1313   RDW 12.4 05/05/2023 1313   LYMPHSABS 1.4 05/05/2023 1313   MONOABS 0.6 05/05/2023 1313  EOSABS 0.2  05/05/2023 1313   BASOSABS 0.1 05/05/2023 1313    CMP     Component Value Date/Time   NA 133 (L) 05/05/2023 1313   NA 144 03/23/2021 0000   K 4.0 05/05/2023 1313   CL 101 05/05/2023 1313   CO2 25 05/05/2023 1313   GLUCOSE 83 05/09/2023 0806   BUN 21 05/05/2023 1313   BUN 16 03/23/2021 0000   CREATININE 0.63 05/05/2023 1313   CALCIUM 9.2 05/05/2023 1313   PROT 6.2 (L) 04/27/2022 2344   ALBUMIN 4.2 04/27/2022 2344   AST 21 04/27/2022 2344   ALT 14 04/27/2022 2344   ALKPHOS 39 04/27/2022 2344   BILITOT 0.4 04/27/2022 2344   GFRNONAA >60 05/05/2023 1313   GFRAA >60 11/23/2017 0710    Assessment: 1.  Epigastric pain: With known mild gastritis on EGD last year, was having similar pain with Dr. 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  Chronic constipation: Better with patient's multiple products including MiraLAX, Linzess, occasional glycerin suppositories  Plan: 1.  Continue meds for constipation.  We will check on the status of Linzess assistance. 2.  Increased Pantoprazole to 40 mg twice daily, 30-60 minutes before breakfast and dinner.  #60 with 5 refills. 3.  Patient continued continue to use Carafate up to 4 times daily as needed 4.  Gave patient instructions on a MiraLAX bowel prep.  She can do this once a month as needed if she feels backed up. 5.  Patient to follow in clinic with [place] in 3 to 4 months.  She will check in prior to that to let me know how she is doing.  Hyacinth Meeker, PA-C [facility] 07/16/2023, 1:27 PM  Cc: [place], Betty G, MD

## 2023-07-16 NOTE — Progress Notes (Signed)
Attending Physician's Attestation   I have reviewed the chart.   I agree with the Advanced Practitioner's note, impression, and recommendations with any updates as below. Reasonable as outlined for the approach.  Would have our team reach out to patient in 6 to 8 weeks to see how she is doing.  If she still having issues, recommend cross-sectional imaging with a CT abdomen/pelvis with IV and oral contrast.   Corliss Parish, MD [facility] Advanced Endoscopy Office # [phone]

## 2023-07-16 NOTE — Patient Instructions (Signed)
We have sent the following medications to your pharmacy for you to pick up at your convenience: Pantoprazole 40 mg twice daily 30-60 minutes before breakfast and dinner.   Victorino Dike recommends that you complete a bowel purge (to clean out your bowels) as needed. Please do the following: Purchase a bottle of Miralax over the counter as well as a box of 5 mg dulcolax tablets. Take 4 dulcolax tablets. Wait 1 hour. You will then drink 6-8 capfuls of Miralax mixed in an adequate amount of water/juice/gatorade (you may choose which of these liquids to drink) over the next 2-3 hours. You should expect results within 1 to 6 hours after completing the bowel purge.  Will follow up on Linzess patient assistance and update you later this week.   _______________________________________________________  If your blood pressure at your visit was 140/90 or greater, please contact your primary care physician to follow up on this.  _______________________________________________________  If you are age 81 or older, your body mass index should be between 23-30. Your Body mass index is 23.03 kg/m. If this is out of the aforementioned range listed, please consider follow up with your Primary Care Provider.  If you are age 72 or younger, your body mass index should be between 19-25. Your Body mass index is 23.03 kg/m. If this is out of the aformentioned range listed, please consider follow up with your Primary Care Provider.   ________________________________________________________  The [facility] providers would like to encourage you to use [facility] to communicate with providers for non-urgent requests or questions.  Due to long hold times on the telephone, sending your provider a message by [facility] may be a faster and more efficient way to get a response.  Please allow 48 business hours for a response.  Please remember that this is for non-urgent requests.   _______________________________________________________

## 2023-07-17 ENCOUNTER — Telehealth: Payer: Self-pay | Admitting: *Deleted

## 2023-07-17 DIAGNOSIS — M461 Sacroiliitis, not elsewhere classified: Secondary | ICD-10-CM | POA: Diagnosis not present

## 2023-07-17 NOTE — Telephone Encounter (Signed)
-----   Message from Unk Lightning sent at 07/16/2023  2:38 PM EDT ----- Regarding: 6 MOS FOLLOW UP Please put recall for check in and if not better ct ----- Message ----- From: Lemar Lofty., MD Sent: 07/16/2023   2:35 PM EDT To: Unk Lightning, PA     ----- Message ----- From: Unk Lightning, [place] Sent: 07/16/2023   2:21 PM EDT To: Lemar Lofty., MD

## 2023-07-17 NOTE — Telephone Encounter (Signed)
Received order to recall patient to see how she is doing in 6-8 weeks and if no better, order a CT for ABD/ Pelvis IV and oral contrast. Reminder done.

## 2023-07-23 DIAGNOSIS — M0579 Rheumatoid arthritis with rheumatoid factor of multiple sites without organ or systems involvement: Secondary | ICD-10-CM | POA: Diagnosis not present

## 2023-07-24 ENCOUNTER — Ambulatory Visit: Payer: Medicare Other

## 2023-07-24 DIAGNOSIS — M816 Localized osteoporosis [Lequesne]: Secondary | ICD-10-CM | POA: Diagnosis not present

## 2023-07-24 DIAGNOSIS — E785 Hyperlipidemia, unspecified: Secondary | ICD-10-CM

## 2023-07-24 MED ORDER — DENOSUMAB 60 MG/ML ~~LOC~~ SOSY
60.0000 mg | PREFILLED_SYRINGE | Freq: Once | SUBCUTANEOUS | Status: AC
Start: 2023-07-24 — End: 2023-07-24
  Administered 2023-07-24: 60 mg via SUBCUTANEOUS

## 2023-07-24 NOTE — Progress Notes (Signed)
Per orders of Dr. [place] , injection of Prolia  given by Stann Ore. Patient tolerated injection well.

## 2023-07-31 ENCOUNTER — Encounter: Payer: Self-pay | Admitting: Family Medicine

## 2023-07-31 DIAGNOSIS — N8110 Cystocele, unspecified: Secondary | ICD-10-CM

## 2023-08-19 ENCOUNTER — Encounter: Payer: Self-pay | Admitting: Family Medicine

## 2023-08-20 ENCOUNTER — Telehealth: Payer: Self-pay | Admitting: Family Medicine

## 2023-08-20 NOTE — Telephone Encounter (Signed)
Pt has been scheduled for tomorrow:  Tuesday, 08/21/23 at 12:30 pm

## 2023-08-20 NOTE — Telephone Encounter (Signed)
ERROR PLEASE DISREGARD

## 2023-08-21 ENCOUNTER — Encounter: Payer: Self-pay | Admitting: Family Medicine

## 2023-08-21 ENCOUNTER — Ambulatory Visit (INDEPENDENT_AMBULATORY_CARE_PROVIDER_SITE_OTHER): Payer: Medicare Other | Admitting: Family Medicine

## 2023-08-21 VITALS — BP 120/70 | HR 88 | Resp 16 | Ht 63.0 in | Wt 128.0 lb

## 2023-08-21 DIAGNOSIS — R296 Repeated falls: Secondary | ICD-10-CM | POA: Diagnosis not present

## 2023-08-21 DIAGNOSIS — R350 Frequency of micturition: Secondary | ICD-10-CM

## 2023-08-21 DIAGNOSIS — G4700 Insomnia, unspecified: Secondary | ICD-10-CM | POA: Diagnosis not present

## 2023-08-21 DIAGNOSIS — E785 Hyperlipidemia, unspecified: Secondary | ICD-10-CM

## 2023-08-21 DIAGNOSIS — M0579 Rheumatoid arthritis with rheumatoid factor of multiple sites without organ or systems involvement: Secondary | ICD-10-CM | POA: Diagnosis not present

## 2023-08-21 DIAGNOSIS — F411 Generalized anxiety disorder: Secondary | ICD-10-CM | POA: Diagnosis not present

## 2023-08-21 DIAGNOSIS — F331 Major depressive disorder, recurrent, moderate: Secondary | ICD-10-CM

## 2023-08-21 DIAGNOSIS — K581 Irritable bowel syndrome with constipation: Secondary | ICD-10-CM | POA: Diagnosis not present

## 2023-08-21 LAB — POC URINALSYSI DIPSTICK (AUTOMATED)
Bilirubin, UA: NEGATIVE
Blood, UA: POSITIVE
Glucose, UA: NEGATIVE
Ketones, UA: NEGATIVE
Leukocytes, UA: NEGATIVE
Nitrite, UA: NEGATIVE
Protein, UA: NEGATIVE
Spec Grav, UA: 1.01 (ref 1.010–1.025)
Urobilinogen, UA: 0.2 U/dL
pH, UA: 6.5 (ref 5.0–8.0)

## 2023-08-21 MED ORDER — ATORVASTATIN CALCIUM 10 MG PO TABS
10.0000 mg | ORAL_TABLET | Freq: Every day | ORAL | 2 refills | Status: DC
Start: 2023-08-21 — End: 2024-05-28

## 2023-08-21 NOTE — Assessment & Plan Note (Signed)
Problem is not well-controlled. Continue adequate hydration and fiber intake. Currently on Linzess 290 mcg daily. She follows with gastroenterologist.

## 2023-08-21 NOTE — Progress Notes (Signed)
HPI: Jennifer Hull is a 82 y.o. female with a PMHx significant for aortic atherosclerosis, GERD, IBS, hypothyroidism, RA, peripheral neuropathy, HLD, anxiety, B12 deficiency, and vitamin D deficiency, who is here today for chronic disease management and a urinary problem.  Last seen on 02/23/2023  Urinary problem: She wonders if she has a UTI. She complains of a new abnormal sensation, she cannot described but it is in her "bladder and urethra" for the last 4 days.  She endorses slight increase in urinary frequency. She denies irritation while urinating, dysuria, gross hematuria, urine incontinence (except when she had an URI recently, cough for about 3 weeks causing urine leakage), or changes in bowel habits. She is not taking anything OTC.   -Constipation, she has a small bowel movements a couple times per day, she usually has to strain. She follows with gastroenterologist. Currently she is on bisacodyl suppositories, MiraLAX, and Linzess 290 mcg daily. Negative for abdominal pain, nausea, vomiting, melena.  History of cystocele, which she describes as mild. She has an appointment scheduled with gynec-urologist.   Hyperlipidemia: Currently on atorvastatin 20 mg 0.5 tab daily.  Side effects from medication: none Lab Results  Component Value Date   CHOL 147 01/19/2022   HDL 69.50 01/19/2022   LDLCALC 65 01/19/2022   LDLDIRECT 67.0 01/30/2020   TRIG 60.0 01/19/2022   CHOLHDL 2 01/19/2022   Anxiety/Insomnia;  Currently taking sertraline, for the past 2 months she has been alternating between 100 and 150 mg daily. She also takes temazepam 15 mg daily as needed. Anxiety has been worse recently due to presidential election. She says she believes the medication is helping.  She reports she is sleeping ~7 hours per night.     08/21/2023    1:43 PM 02/23/2023    3:06 PM 08/25/2022   12:05 PM 04/04/2022    3:16 PM 03/13/2022    9:58 AM  Depression screen PHQ 2/9  Decreased  Interest 0 0 0 0 0  Down, Depressed, Hopeless 0 0 0 0 0  PHQ - 2 Score 0 0 0 0 0  Altered sleeping 1 1 0  1  Tired, decreased energy 1 2 0  2  Change in appetite 0 0 0  0  Feeling bad or failure about yourself  0 0 0  0  Trouble concentrating 2 1 0  2  Moving slowly or fidgety/restless 1 0 0  2  Suicidal thoughts 0 0 0  0  PHQ-9 Score 5 4 0  7  Difficult doing work/chores Somewhat difficult Somewhat difficult   Not difficult at all      08/21/2023    1:43 PM 02/23/2023    3:06 PM 01/24/2021    3:48 PM 01/30/2020    5:04 PM  GAD 7 : Generalized Anxiety Score  Nervous, Anxious, on Edge 2 0 0 1  Control/stop worrying 1 0 1 0  Worry too much - different things 1 0 1 0  Trouble relaxing 1 0 1 0  Restless 1 0 0 0  Easily annoyed or irritable 1 0 0 0  Afraid - awful might happen 1 0 1 0  Total GAD 7 Score 8 0 4 1  Anxiety Difficulty Somewhat difficult Not difficult at all Somewhat difficult Not difficult at all   Falls:  She also mentions she has fallen 4 times this year.  No serious injuries. She has a cane to help with stability, recently purchased a walker, which she is not  using frequently. Most falls in her house while she is not having assistance device.  Seasonal allergies: She has recently switched from Loratadine to Zyrtec, which she says has helped more with her allergies.   She is still following with orthopedics and rheumatology.    Review of Systems  Constitutional:  Positive for fatigue. Negative for activity change, appetite change and fever.  HENT:  Negative for mouth sores, nosebleeds and trouble swallowing.   Eyes:  Negative for redness and visual disturbance.  Respiratory:  Negative for cough, shortness of breath and wheezing.   Cardiovascular:  Negative for chest pain, palpitations and leg swelling.  Gastrointestinal:        Negative for changes in bowel habits.  Endocrine: Negative for cold intolerance and heat intolerance.  Musculoskeletal:  Positive for  arthralgias and gait problem.  Allergic/Immunologic: Positive for environmental allergies.  Neurological:  Negative for syncope, weakness and headaches.  Psychiatric/Behavioral:  Negative for confusion and hallucinations. The patient is nervous/anxious.   See other pertinent positives and negatives in HPI.  Current Outpatient Medications on File Prior to Visit  Medication Sig Dispense Refill   acetaminophen (TYLENOL) 500 MG tablet Take 1,000 mg by mouth every 8 (eight) hours as needed for moderate pain.     bisacodyl (DULCOLAX) 10 MG suppository Every 2 days as needed. 30 suppository 1   Calcium Carb-Cholecalciferol (CALCIUM 600+D3 PO) Take 1 tablet by mouth daily with lunch.     cholecalciferol (VITAMIN D3) 25 MCG (1000 UNIT) tablet Take 4,000 Units by mouth daily.     fluticasone (FLONASE) 50 MCG/ACT nasal spray Place 2 sprays into both nostrils at bedtime.     folic acid (FOLVITE) 800 MCG tablet Take 800 mcg by mouth daily.     levothyroxine (SYNTHROID) 112 MCG tablet TAKE 1 TABLET(112 MCG) BY MOUTH DAILY BEFORE BREAKFAST 90 tablet 2   linaclotide (LINZESS) 290 MCG CAPS capsule Take 1 capsule (290 mcg total) by mouth daily before breakfast. 30 capsule 6   loratadine (CLARITIN) 10 MG tablet TAKE 1 TABLET(10 MG) BY MOUTH DAILY 30 tablet 11   magnesium oxide (MAG-OX) 400 MG tablet Take 400 mg by mouth 2 (two) times daily.     Melatonin 10 MG CAPS Take 10 mg by mouth at bedtime.     pantoprazole (PROTONIX) 40 MG tablet Take 1 tablet (40 mg total) by mouth 2 (two) times daily before a meal. 60 tablet 3   polyethylene glycol (MIRALAX / GLYCOLAX) 17 g packet Take 17 g by mouth daily.     sertraline (ZOLOFT) 100 MG tablet TAKE 1 AND 1/2 TABLETS BY MOUTH EVERY DAY (Patient taking differently: Take 150 mg by mouth daily. Pt taking 150 mg) 135 tablet 2   sucralfate (CARAFATE) 1 GM/10ML suspension Take 1 g by mouth 2 (two) times daily.     temazepam (RESTORIL) 15 MG capsule TAKE 1 CAPSULE BY MOUTH AT  BEDTIME AS NEEDED FOR SLEEP 30 capsule 3   Tocilizumab (ACTEMRA IV) Inject into the vein every 30 (thirty) days.     TURMERIC CURCUMIN PO Take 3,000 mg by mouth daily.     zinc gluconate 50 MG tablet Take 50 mg by mouth daily.     No current facility-administered medications on file prior to visit.    Past Medical History:  Diagnosis Date   Anxiety    Arthritis    Chronic constipation    Depression    Hyperlipidemia    Hypothyroidism    Pneumonia  Rheumatoid arthritis (HCC) 2019   Temporal arteritis (HCC)    Thyroid disease    Allergies  Allergen Reactions   Tdap [Tetanus-Diphth-Acell Pertussis] Other (See Comments)    SEVERE ALLERGIC REACTIONS (NECK STIFFNESS/HIGH FEVER/ETC)   Tetanus Toxoid, Adsorbed     Neck stiffness, and high fever Other reaction(s): Other (See Comments) Neck stiffness, and high fever   Hydroxychloroquine Sulfate Rash   Covid-19 (Mrna) Vaccine Proofreader) [Covid-19 (Mrna) Vaccine] Rash    Social History   Socioeconomic History   Marital status: Married    Spouse name: Not on file   Number of children: 1   Years of education: Not on file   Highest education level: Not on file  Occupational History   Occupation: Nurse  Tobacco Use   Smoking status: Former    Current packs/day: 0.00    Average packs/day: 1.5 packs/day for 8.0 years (12.0 ttl pk-yrs)    Types: Cigarettes    Start date: 33    Quit date: 54    Years since quitting: 46.8   Smokeless tobacco: Never   Tobacco comments:    smoked infrequently when she was young; quit 82 yo   Vaping Use   Vaping status: Never Used  Substance and Sexual Activity   Alcohol use: No   Drug use: No   Sexual activity: Not Currently    Partners: Male    Birth control/protection: Surgical  Other Topics Concern   Not on file  Social History Narrative   Not on file   Social Determinants of Health   Financial Resource Strain: Low Risk  (04/04/2022)   Overall Financial Resource Strain (CARDIA)     Difficulty of Paying Living Expenses: Not hard at all  Food Insecurity: Low Risk  (05/28/2023)   Received from [facility]   Hunger Vital Sign    Worried About Running Out of Food in the Last Year: Never true    Ran Out of Food in the Last Year: Never true  Transportation Needs: Not on file (05/28/2023)  Physical Activity: Inactive (04/04/2022)   Exercise Vital Sign    Days of Exercise per Week: 0 days    Minutes of Exercise per Session: 0 min  Stress: No Stress Concern Present (04/04/2022)   [employer] of Occupational Health - Occupational Stress Questionnaire    Feeling of Stress : Not at all  Social Connections: Not on file    Vitals:   08/21/23 1235  BP: 120/70  Pulse: 88  Resp: 16  SpO2: 97%   Body mass index is 22.67 kg/m.  Physical Exam Vitals and nursing note reviewed.  Constitutional:      General: She is not in acute distress.    Appearance: She is well-developed.  HENT:     Head: Normocephalic and atraumatic.     Mouth/Throat:     Mouth: Mucous membranes are moist.     Pharynx: Oropharynx is clear.  Eyes:     Conjunctiva/sclera: Conjunctivae normal.  Cardiovascular:     Rate and Rhythm: Normal rate and regular rhythm.     Pulses:          Dorsalis pedis pulses are 2+ on the right side and 2+ on the left side.     Heart sounds: No murmur heard. Pulmonary:     Effort: Pulmonary effort is normal. No respiratory distress.     Breath sounds: Normal breath sounds.  Abdominal:     Palpations: Abdomen is soft. There is no hepatomegaly or mass.  Tenderness: There is no abdominal tenderness.  Musculoskeletal:     Right lower leg: No edema.     Left lower leg: No edema.  Lymphadenopathy:     Cervical: No cervical adenopathy.  Skin:    General: Skin is warm.     Findings: No erythema or rash.  Neurological:     General: No focal deficit present.     Mental Status: She is alert and oriented to person, place, and time.     Comments: Unstable gait  assisted with a cane.  Psychiatric:        Mood and Affect: Affect normal. Mood is anxious.   ASSESSMENT AND PLAN:  Ms. Sobotka was seen today for chronic follow up and a urinary problem.   Orders Placed This Encounter  Procedures   Culture, Urine   Urinalysis with Culture Reflex   POCT Urinalysis Dipstick (Automated)   Insomnia, unspecified type Assessment & Plan: Problem is stable. Currently on Melatonin 10 mg daily 1-2 hours before bedtime. Continue Temazepam 15 mg at bedtime prn. Continue good sleep hygiene. F/U in 6 months, before if needed.  Urinary frequency Urine dipstick here in the office negative except for 1+ of blood. Past history of microscopic hematuria. Will send urine for culture and recommendation will be given accordingly. Kegel/pelvic floor exercises recommended. Avoiding constipation is also important given her history of cystocele. She has an appointment with your gynecology urologist in a few days. Monitor for new symptoms. Instructed on warning signs.  -     Urinalysis w microscopic + reflex cultur -     Urine Culture; Future -     POCT Urinalysis Dipstick (Automated)  Generalized anxiety disorder Assessment & Plan: In general problem is stable, aggravated recently due to news and presidential elections. Continue sertraline and temazepam same dose. Follow-up in 6 months, before if needed.  Irritable bowel syndrome with constipation Assessment & Plan: Problem is not well-controlled. Continue adequate hydration and fiber intake. Currently on Linzess 290 mcg, bisacodyl suppositories, and MiraLAX daily. She follows with gastroenterologist.  Frequent falls Assessment & Plan: We discussed possible risk factors, some of her comorbidities as well as medications can increase risk for falls. She has tried PT but has not been consistent with doing exercises. For now she is not interested in PT referral. We discussed fall precautions. Handicap sticker  provided.  Hyperlipidemia, unspecified hyperlipidemia type Assessment & Plan: Continue atorvastatin 10 mg daily and low-fat diet. New prescription sent. She is not fasting today, we can plan on checking lipid panel next visit.  Orders: -     Atorvastatin Calcium; Take 1 tablet (10 mg total) by mouth daily.  Dispense: 90 tablet; Refill: 2  Major depressive disorder, recurrent episode, moderate (HCC) Assessment & Plan: Problem is adequately controlled. Continue sertraline, alternating between 150 and 100 mg every other day.  We can try to decrease sertraline dose to 100 mg daily in the future once anxiety improves. Follow-up in 6 months.  Return in about 6 months (around 02/18/2024).  I, Rolla Etienne Wierda, acting as a scribe for Arvie Villarruel [place], MD., have documented all relevant documentation on the behalf of Kennisha Qin [place], MD, as directed by  Jumaane Weatherford [place], MD while in the presence of Cherylann Hobday [place], MD.   I, Mary Hockey [place], MD, have reviewed all documentation for this visit. The documentation on 08/21/23 for the exam, diagnosis, procedures, and orders are all accurate and complete.  Broc Caspers G. [place], MD  [facility]. [place] office.

## 2023-08-21 NOTE — Assessment & Plan Note (Signed)
Continue atorvastatin 10 mg daily and low-fat diet. New prescription sent. She is not fasting today, we can plan on checking lipid panel next visit.

## 2023-08-21 NOTE — Patient Instructions (Addendum)
A few things to remember from today's visit:  Generalized anxiety disorder  Insomnia, unspecified type  Urinary frequency - Plan: Urinalysis with Culture Reflex  Irritable bowel syndrome with constipation  Frequent falls  If you need refills for medications you take chronically, please call your pharmacy. Do not use My Chart to request refills or for acute issues that need immediate attention. If you send a my chart message, it may take a few days to be addressed, specially if I am not in the office.  Please be sure medication list is accurate. If a new problem present, please set up appointment sooner than planned today.

## 2023-08-21 NOTE — Assessment & Plan Note (Signed)
We discussed possible risk factors, some of her comorbidities as well as medications can increase risk for falls. She has tried PT but has not been consistent with doing exercises. For now she is not interested in PT referral. We discussed fall precautions.

## 2023-08-21 NOTE — Assessment & Plan Note (Signed)
>>  ASSESSMENT AND PLAN FOR IRRITABLE BOWEL SYNDROME WITH CONSTIPATION WRITTEN ON 08/21/2023  1:30 PM BY [place], BETTY G, MD  Problem is not well-controlled. Continue adequate hydration and fiber intake. Currently on Linzess 290 mcg daily. She follows with gastroenterologist.

## 2023-08-21 NOTE — Assessment & Plan Note (Signed)
Problem is adequately controlled. Continue sertraline, alternating between 150 and 100 mg every other day.  We can try to decrease sertraline dose to 100 mg daily in the future once anxiety improves. Follow-up in 6 months.

## 2023-08-21 NOTE — Assessment & Plan Note (Signed)
In general problem is stable, aggravated recently due to news and presidential elections. Continue sertraline and temazepam same dose. Follow-up in 6 months, before if needed.

## 2023-08-21 NOTE — Assessment & Plan Note (Signed)
Problem is stable. Currently on Melatonin 10 mg daily 1-2 hours before bedtime. Continue Temazepam 15 mg at bedtime prn. Continue good sleep hygiene. F/U in 6 months, before if needed.

## 2023-08-22 LAB — URINALYSIS W MICROSCOPIC + REFLEX CULTURE
Bacteria, UA: NONE SEEN /[HPF]
Bilirubin Urine: NEGATIVE
Glucose, UA: NEGATIVE
Hgb urine dipstick: NEGATIVE
Hyaline Cast: NONE SEEN /[LPF]
Ketones, ur: NEGATIVE
Leukocyte Esterase: NEGATIVE
Nitrites, Initial: NEGATIVE
Protein, ur: NEGATIVE
RBC / HPF: NONE SEEN /[HPF] (ref 0–2)
Specific Gravity, Urine: 1.01 (ref 1.001–1.035)
Squamous Epithelial / HPF: NONE SEEN /[HPF] (ref <=5)
WBC, UA: NONE SEEN /[HPF] (ref 0–5)
pH: 6.5 (ref 5.0–8.0)

## 2023-08-22 LAB — NO CULTURE INDICATED

## 2023-08-28 ENCOUNTER — Ambulatory Visit (INDEPENDENT_AMBULATORY_CARE_PROVIDER_SITE_OTHER): Payer: Medicare Other | Admitting: Obstetrics

## 2023-08-28 ENCOUNTER — Other Ambulatory Visit: Payer: Self-pay | Admitting: *Deleted

## 2023-08-28 ENCOUNTER — Telehealth: Payer: Self-pay | Admitting: *Deleted

## 2023-08-28 ENCOUNTER — Ambulatory Visit: Payer: Medicare Other | Admitting: Family Medicine

## 2023-08-28 ENCOUNTER — Encounter: Payer: Self-pay | Admitting: Obstetrics

## 2023-08-28 VITALS — BP 120/70 | Ht 59.06 in | Wt 123.4 lb

## 2023-08-28 DIAGNOSIS — N810 Urethrocele: Secondary | ICD-10-CM

## 2023-08-28 DIAGNOSIS — M6289 Other specified disorders of muscle: Secondary | ICD-10-CM | POA: Diagnosis not present

## 2023-08-28 DIAGNOSIS — N3946 Mixed incontinence: Secondary | ICD-10-CM | POA: Insufficient documentation

## 2023-08-28 DIAGNOSIS — K5900 Constipation, unspecified: Secondary | ICD-10-CM | POA: Diagnosis not present

## 2023-08-28 DIAGNOSIS — R1013 Epigastric pain: Secondary | ICD-10-CM

## 2023-08-28 DIAGNOSIS — K581 Irritable bowel syndrome with constipation: Secondary | ICD-10-CM

## 2023-08-28 DIAGNOSIS — N368 Other specified disorders of urethra: Secondary | ICD-10-CM | POA: Insufficient documentation

## 2023-08-28 LAB — POCT URINALYSIS DIPSTICK
Bilirubin, UA: NEGATIVE
Blood, UA: NEGATIVE
Glucose, UA: NEGATIVE
Ketones, UA: NEGATIVE
Leukocytes, UA: NEGATIVE
Nitrite, UA: NEGATIVE
Protein, UA: NEGATIVE
Spec Grav, UA: 1.01 (ref 1.010–1.025)
Urobilinogen, UA: 0.2 U/dL
pH, UA: 6.5 (ref 5.0–8.0)

## 2023-08-28 MED ORDER — ESTROGENS CONJUGATED 0.625 MG/GM VA CREA
1.0000 | TOPICAL_CREAM | VAGINAL | 0 refills | Status: DC
Start: 2023-08-30 — End: 2023-08-30

## 2023-08-28 MED ORDER — ESTROGENS CONJUGATED 0.625 MG/GM VA CREA: 1.0000 | TOPICAL_CREAM | VAGINAL | 2 refills | Status: DC

## 2023-08-28 NOTE — Telephone Encounter (Signed)
-----   Message from Nurse Alona Bene B sent at 07/17/2023 11:26 AM EDT ----- Regarding: recall to check on patient in 6-8 weeks Reminder in 6-8 weeks to check on patient to see how she is doing. If no better order a CT ABD/ Pelvis with IV and oral contrast.

## 2023-08-28 NOTE — Assessment & Plan Note (Signed)
-   pressure and discomfort with palpation of left sided pelvic floor muscles and puborectalis likely due to chronic constipation and straining - encouraged squatting position for BM  - consider pelvic floor PT after treatment of vaginal atrophy

## 2023-08-28 NOTE — Assessment & Plan Note (Signed)
-   minimal symptoms of SUI only during URI and rare urgency leakage - For treatment of stress urinary incontinence,  non-surgical options include expectant management, weight loss, physical therapy, as well as a pessary.  Surgical options include a midurethral sling, Burch urethropexy, and transurethral injection of a bulking agent. - start vaginal estrogen

## 2023-08-28 NOTE — Progress Notes (Signed)
New Patient Evaluation and Consultation  Referring Provider: [place], Betty G, MD PCP: [place], Betty G, MD Date of Service: 08/28/2023  Spanish interpreter Milly Lowdermilk  SUBJECTIVE Chief Complaint: New Patient (Initial Visit) (Jenalyn Darden Debell is a 82 y.o. female here today for female organ prolapse. /)  History of Present Illness: Trimeka Gander Mair is a 82 y.o.  Hispanic  female seen in consultation at the request of Dr Swaziland for evaluation of pelvic organ prolapse.    Reports symptom of vaginal pressure and bladder and urethral discomfort.  Denies dysuria, hematuria, pain, increased urinary frequency/urgency.  Evaluated for UTI on 08/21/2023 with negative urine culture.  Reports 4 falls this year and uses cane with ambulation, inconsistent walker use Symptoms of marble size bulge reduced and symptoms improved.   Review of records significant for: chronic pain disorder on extra strength tylenol, peripheral neuropathy, RA, temporal arteritis, depression on Zoloft, history of recurrent epistaxis  Urinary Symptoms: Leaks urine with cough/ sneeze and with most recent URI for 3 weeks Leaks 1-3 time(s) per days only during URI Leaks with urgency 1-2x/month Denies pad use  Patient is bothered by UI symptoms.  Day time voids 5-6.  Nocturia: 0 times per night to void with insomnia Voiding dysfunction:  empties bladder well.  Patient does not use a catheter to empty bladder.  When urinating, patient feels she has no difficulties Drinks: 40oz per day, 2 cups of coffee  UTIs:  0  UTI's in the last year.   Denies history of blood in urine, kidney or bladder stones, pyelonephritis, bladder cancer, and kidney cancer No results found for the last 90 days.   Pelvic Organ Prolapse Symptoms:                  Patient Admits to a feeling of a marble size bulge the vaginal area. It has been present for 1 months.  Patient Admits to seeing a bulge.  This bulge is bothersome.  Bowel  Symptom: Bowel movements: 1 time(s) per day with IBS-constipation managed by GI Passes stool with every void if she does not have good bowel movement during the morning Reports constipation since childhood managed with stool softener Stool consistency: soft  Straining: yes.  Splinting: no Incomplete evacuation: yes.  Patient Denies accidental bowel leakage / fecal incontinence Bowel regimen: MiraLAX, mag oxide, Linzess to 90 mcg, Dulcolax suppository and pills PRN started 3 years ago Reports nausea with Miralax beyond > 2 capfuls Last colonoscopy: Date 04/26/22, Results 4 sessile polyps in transverse colon, internal and external Grade II hemorrhoids, due for repeat in 3 years [facility] Colonoscopy     This patient has no relevant Health Maintenance data.       Sexual Function Sexually active: no.  Sexual orientation: Straight Pain with sex: No  Pelvic Pain Denies pelvic pain  Past Medical History:  Past Medical History:  Diagnosis Date   Anxiety    Arthritis    Chronic constipation    Depression    Hyperlipidemia    Hypothyroidism    Pneumonia    Rheumatoid arthritis (HCC) 2019   Temporal arteritis (HCC)    Thyroid disease      Past Surgical History:   Past Surgical History:  Procedure Laterality Date   ABDOMINAL HYSTERECTOMY     BREAST EXCISIONAL BIOPSY Left    BREAST SURGERY     biopsy   COLONOSCOPY     JOINT REPLACEMENT     REVERSE SHOULDER ARTHROPLASTY Left 06/09/2021  Procedure: REVERSE SHOULDER ARTHROPLASTY;  Surgeon: Jones Broom, MD;  Location: [facility];  Service: Orthopedics;  Laterality: Left;   SHOULDER SURGERY Left 06/09/2021   TEMPORAL ARTERY BIOPSY / LIGATION Bilateral 2014   TONSILLECTOMY     TOTAL SHOULDER ARTHROPLASTY Right 11/22/2017   Procedure: RIGHT REVERSE TOTAL SHOULDER ARTHROPLASTY;  Surgeon: Jones Broom, MD;  Location: [facility];  Service: Orthopedics;  Laterality: Right;     Past OB/GYN History: OB History  Gravida Para Term  Preterm AB Living  1         1  SAB IAB Ectopic Multiple Live Births          1    # Outcome Date GA Lbr Len/2nd Weight Sex Type Anes PTL Lv  1 Gravida 03/28/79    F Vag-Spont None  LIV    Vaginal deliveries: 1, episiotomy, largest infant 7lbs  Forceps/ Vacuum deliveries: 0, Cesarean section: 0 Menopausal: Yes, at age ~89, Denies vaginal bleeding since menopause and underwent hysterectomy in 1986 Contraception: n/a. Last pap smear was after hysterectomy, cannot recall.  Any history of abnormal pap smears: no. No results found for: "DIAGPAP", "HPVHIGH", "ADEQPAP"  Medications: Patient has a current medication list which includes the following prescription(s): acetaminophen, atorvastatin, bisacodyl, calcium carb-cholecalciferol, cholecalciferol, [START ON 08/30/2023] conjugated estrogens, [START ON 08/30/2023] conjugated estrogens, folic acid, levothyroxine, linaclotide, loratadine, magnesium oxide, melatonin, pantoprazole, polyethylene glycol, sertraline, sucralfate, temazepam, tocilizumab, turmeric, and zinc gluconate.   Allergies: Patient is allergic to tdap [tetanus-diphth-acell pertussis]; tetanus toxoid, adsorbed; hydroxychloroquine sulfate; and covid-19 (mrna) vaccine (pfizer) [covid-19 (mrna) vaccine].   Social History:  Social History   Tobacco Use   Smoking status: Former    Current packs/day: 0.00    Average packs/day: 1.5 packs/day for 8.0 years (12.0 ttl pk-yrs)    Types: Cigarettes    Start date: 20    Quit date: 103    Years since quitting: 46.9   Smokeless tobacco: Never   Tobacco comments:    smoked infrequently when she was young; quit 82 yo   Vaping Use   Vaping status: Never Used  Substance Use Topics   Alcohol use: No   Drug use: No    Relationship status: married Patient lives with her husband.   Patient is not employed. Regular exercise: No History of abuse: No  Family History:   Family History  Problem Relation Age of Onset   Kidney cancer  Mother    Heart disease Father    Breast cancer Maternal Aunt    Prostate cancer Paternal Uncle    Colon cancer Neg Hx    Esophageal cancer Neg Hx    Rectal cancer Neg Hx    Stomach cancer Neg Hx    Inflammatory bowel disease Neg Hx    Liver disease Neg Hx    Pancreatic cancer Neg Hx      Review of Systems: Review of Systems  Constitutional:  Negative for fever, malaise/fatigue and weight loss.  Respiratory:  Positive for cough. Negative for shortness of breath and wheezing.   Cardiovascular:  Negative for chest pain, palpitations and leg swelling.  Gastrointestinal:  Positive for constipation. Negative for abdominal pain and blood in stool.  Genitourinary:  Negative for dysuria, frequency, hematuria and urgency.       Leakage, bulge  Skin:  Negative for rash.  Neurological:  Negative for dizziness, weakness and headaches.  Endo/Heme/Allergies:  Does not bruise/bleed easily.  Psychiatric/Behavioral:  Negative for depression. The patient is not nervous/anxious.  OBJECTIVE Physical Exam: Vitals:   08/28/23 0933  BP: 120/70  Weight: 123 lb 6.4 oz (56 kg)  Height: 4' 11.06" (1.5 m)    Physical Exam Constitutional:      General: She is not in acute distress.    Appearance: Normal appearance.  Genitourinary:     Bladder and urethral meatus normal.     No lesions in the vagina.     Genitourinary Comments: Circumferential prolapse urethral mucosa around 0.5cm, sensitive to touch, no bleeding or discharge. High pelvic floor muscle tone     Right Labia: No rash, tenderness, lesions, skin changes or Bartholin's cyst.    Left Labia: No tenderness, lesions, skin changes, Bartholin's cyst or rash.       No vaginal discharge, erythema, tenderness, bleeding, ulceration or granulation tissue.     No vaginal prolapse present.    Severe vaginal atrophy present.     Right Adnexa: not tender and no mass present.    Left Adnexa: not tender, not full and no mass present.     Cervix is absent.     Uterus is absent.     Urethral meatus caruncle not present.    Urethral prolapse present.     No urethral tenderness, mass, hypermobility, discharge or stress urinary incontinence with cough stress test present.     Bladder is not tender, urgency on palpation not present and masses not present.      Levator ani is tender (mostly left sided and at midline with palpation of puborectalis).     Obturator internus not tender, no asymmetrical contractions present and no pelvic spasms present.    Symmetrical pelvic sensation, anal wink present and BC reflex present. Cardiovascular:     Rate and Rhythm: Normal rate.  Pulmonary:     Effort: Pulmonary effort is normal. No respiratory distress.  Abdominal:     General: There is no distension.     Palpations: There is no mass.     Tenderness: There is no abdominal tenderness.     Hernia: No hernia is present.    Neurological:     Mental Status: She is alert.  Vitals reviewed. Exam conducted with a chaperone present.     POP-Q:   POP-Q  -3                                            Aa   -3                                           Ba  -5                                              C   1                                            Gh  2  Pb  5                                            tvl   -3                                            Ap  -3                                            Bp                                                 D    Post-Void Residual (PVR) by Bladder Scan: In order to evaluate bladder emptying, we discussed obtaining a postvoid residual and patient agreed to this procedure.  Procedure: The ultrasound unit was placed on the patient's abdomen in the suprapubic region after the patient had voided.    Post Void Residual - 08/28/23 1015       Post Void Residual   Post Void Residual 89 mL              Laboratory Results: Lab  Results  Component Value Date   COLORU Yellow 08/28/2023   CLARITYU Clear 08/28/2023   GLUCOSEUR Negative 08/28/2023   BILIRUBINUR Negative 08/28/2023   KETONESU Negative 08/28/2023   SPECGRAV 1.010 08/28/2023   RBCUR Negative 08/28/2023   PHUR 6.5 08/28/2023   PROTEINUR Negative 08/28/2023   UROBILINOGEN 0.2 08/28/2023   LEUKOCYTESUR Negative 08/28/2023    Lab Results  Component Value Date   CREATININE 0.63 05/05/2023   CREATININE 0.66 04/27/2022   CREATININE 0.70 06/10/2021    Lab Results  Component Value Date   HGBA1C 5.4 05/09/2023    Lab Results  Component Value Date   HGB 13.2 05/05/2023     ASSESSMENT AND PLAN Ms. Liszewski is a 82 y.o. with:  1. Irritable bowel syndrome with constipation   2. Urethral prolapse   3. Mixed stress and urge urinary incontinence   4. High-tone pelvic floor dysfunction   5. Constipation, unspecified constipation type     Irritable bowel syndrome with constipation  Urethral prolapse Assessment & Plan: - reviewed anatomy and provided handout for urethral prolapse - UA negative and bladder scan 89mL  - discussed management options including expectant management, medical management, and surgical intervention.  - samples and Rx provided for Premarin - discussed symptoms that warrant immediate evaluation and intervention if she develops pain, bleeding, or inability to void  Orders: -     Estrogens Conjugated; Place 1 Applicatorful vaginally 2 (two) times a week. Place 0.5g nightly for two weeks then twice a week after  Dispense: 4 g; Refill: 0 -     Estrogens Conjugated; Place 1 Applicatorful vaginally 2 (two) times a week. Place 0.5g twice a week after  Dispense: 30 g; Refill: 2  Mixed stress and urge urinary incontinence Assessment & Plan: - minimal symptoms of SUI only during URI and rare urgency leakage - For treatment of stress urinary  incontinence,  non-surgical options include expectant management, weight loss, physical  therapy, as well as a pessary.  Surgical options include a midurethral sling, Burch urethropexy, and transurethral injection of a bulking agent. - start vaginal estrogen  Orders: -     POCT urinalysis dipstick  High-tone pelvic floor dysfunction Assessment & Plan: - pressure and discomfort with palpation of left sided pelvic floor muscles and puborectalis likely due to chronic constipation and straining - encouraged squatting position for BM  - consider pelvic floor PT after treatment of vaginal atrophy   Constipation, unspecified constipation type Assessment & Plan: - chronic constipation since childhood - MiraLAX BID, mag oxide, Linzess to 90 mcg, Dulcolax suppository and pills PRN started 3 years ago - For constipation, we reviewed the importance of a better bowel regimen.  We also discussed the importance of avoiding chronic straining, as it can exacerbate her pelvic floor symptoms; we discussed treating constipation and straining prior to surgery, as postoperative straining can lead to damage to the repair and recurrence of symptoms. We discussed initiating therapy with increasing fluid intake, fiber supplementation, stool softeners, and laxatives such as miralax.  - encouraged to continue titration of Miralax to optimize stool consistency and straining due to exacerbation of prolapse symptoms.  - encouraged squatty potty use and pelvic relaxation   Time spent: I spent 67 minutes dedicated to the care of this patient on the date of this encounter to include pre-visit review of records, face-to-face time with the patient discussing urethral prolapse, constipation, high tone pelvic floor, mixed urinary incontinence, and post visit documentation and ordering medication/ testing.   Loleta Chance, MD

## 2023-08-28 NOTE — Assessment & Plan Note (Signed)
-   reviewed anatomy and provided handout for urethral prolapse - UA negative and bladder scan 89mL  - discussed management options including expectant management, medical management, and surgical intervention.  - samples and Rx provided for Premarin - discussed symptoms that warrant immediate evaluation and intervention if she develops pain, bleeding, or inability to void

## 2023-08-28 NOTE — Telephone Encounter (Signed)
"  Called the patient to see how she is doing at this time. Patinet states she has to increase her Miralax to 4 capfuls daily and when she needs to, she adds the Dulcolax suppository and then the Dulcolax pills. She states she has to go through this every 3 weeks. Talked to the patient about her diet as well as drinking water. Patient states she only drinks 4 or 5 glasses of water daily. Informed the patient she should be drinking at least 8 glasses of water daily, at 8 ounces. Patient states she is not happy with this situation and would like it if she could do better about having a bowel movement. She also states she has a BM, but not a full evacuation, she has small BM's daily and when she is slowing down or not having any BM's is when she starts her regime of Miralax, suppositories and dulcolax pills every 3-4 weeks. Suggested the patient also has a CT ABD/Pelvis with IV and oral contrast, as ordered via Ms. Lemmon,PA. Informed the patient imaging will be calling to schedule an appt. Patient agreed.

## 2023-08-28 NOTE — Patient Instructions (Addendum)
You have urethral prolapse.  We discussed the fact that it is not life threatening but there are several treatment options. We discussed options for management including expectant management, conservative management, and surgical management.  Start with vaginal estrogen  For vaginal atrophy (thinning of the vaginal tissue that can cause dryness and burning) and UTI prevention we discussed estrogen replacement in the form of vaginal cream.   Start vaginal estrogen therapy nightly for two weeks then 2 times weekly at night. This can be placed with your finger or an applicator inside the vagina and around the urethra.  Please let us know if the prescription is too expensive and we can look for alternative options.   Is vaginal estrogen therapy safe for me? Vaginal estrogen preparations act on the vaginal skin, and only a very tiny amount is absorbed into the bloodstream (0.01%).  They work in a similar way to hand or face cream.  There is minimal absorption and they are therefore perfectly safe. If you have had breast cancer and have persistent troublesome symptoms which aren't settling with vaginal moisturisers and lubricants, local estrogen treatment may be a possibility, but consultation with your oncologist should take place first.   For treatment of stress urinary incontinence, which is leakage with physical activity/movement/strainging/coughing, we discussed expectant management versus nonsurgical options versus surgery. Nonsurgical options include weight loss, physical therapy, as well as a pessary.  Surgical options include a midurethral sling, which is a synthetic mesh sling that acts like a hammock under the urethra to prevent leakage of urine, a Burch urethropexy, and transurethral injection of a bulking agent.   Constipation: Our goal is to achieve formed bowel movements daily or every-other-day.  You may need to try different combinations of the following options to find what works best for you  - everybody's body works differently so feel free to adjust the dosages as needed.  Some options to help maintain bowel health include:  Dietary changes (more leafy greens, vegetables and fruits; less processed foods) Fiber supplementation (Benefiber, FiberCon, Metamucil or Psyllium). Start slow and increase gradually to full dose. Over-the-counter agents such as: stool softeners (Docusate or Colace) and/or laxatives (Miralax, milk of magnesia)  "Power Pudding" is a natural mixture that may help your constipation.  To make blend 1 cup applesauce, 1 cup wheat bran, and 3/4 cup prune juice, refrigerate and then take 1 tablespoon daily with a large glass of water as needed.

## 2023-08-28 NOTE — Assessment & Plan Note (Signed)
-   chronic constipation since childhood - MiraLAX BID, mag oxide, Linzess to 90 mcg, Dulcolax suppository and pills PRN started 3 years ago - For constipation, we reviewed the importance of a better bowel regimen.  We also discussed the importance of avoiding chronic straining, as it can exacerbate her pelvic floor symptoms; we discussed treating constipation and straining prior to surgery, as postoperative straining can lead to damage to the repair and recurrence of symptoms. We discussed initiating therapy with increasing fluid intake, fiber supplementation, stool softeners, and laxatives such as miralax.  - encouraged to continue titration of Miralax to optimize stool consistency and straining due to exacerbation of prolapse symptoms.  - encouraged squatty potty use and pelvic relaxation

## 2023-08-30 ENCOUNTER — Other Ambulatory Visit: Payer: Self-pay | Admitting: Obstetrics

## 2023-08-30 ENCOUNTER — Encounter: Payer: Self-pay | Admitting: Obstetrics

## 2023-08-30 DIAGNOSIS — N368 Other specified disorders of urethra: Secondary | ICD-10-CM

## 2023-08-30 MED ORDER — ESTRADIOL 0.1 MG/GM VA CREA
0.5000 g | TOPICAL_CREAM | VAGINAL | 2 refills | Status: DC
Start: 2023-08-30 — End: 2024-07-10

## 2023-08-30 NOTE — Progress Notes (Signed)
Rx changed to estrace due to cost.

## 2023-09-03 ENCOUNTER — Encounter: Payer: Self-pay | Admitting: Emergency Medicine

## 2023-09-03 ENCOUNTER — Telehealth: Payer: Self-pay | Admitting: Physician Assistant

## 2023-09-03 ENCOUNTER — Other Ambulatory Visit: Payer: Self-pay | Admitting: Family Medicine

## 2023-09-03 DIAGNOSIS — Z1231 Encounter for screening mammogram for malignant neoplasm of breast: Secondary | ICD-10-CM

## 2023-09-03 DIAGNOSIS — R053 Chronic cough: Secondary | ICD-10-CM

## 2023-09-03 NOTE — Telephone Encounter (Signed)
Inbound call from patient stating she received a message from nurse through [facility]. States she is unable to understand message and requesting for a phone call. Please advise, thank you.

## 2023-09-03 NOTE — Telephone Encounter (Signed)
Noted  

## 2023-09-03 NOTE — Telephone Encounter (Signed)
Patient stated that she no longer needs the information below. And was able to figure it out.

## 2023-09-03 NOTE — Telephone Encounter (Signed)
Please make sure that she has a CXR prior to that visit.  If she has not tried tessalon perles, then would recommend 100mg  q6h prn for cough

## 2023-09-04 MED ORDER — BENZONATATE 100 MG PO CAPS
100.0000 mg | ORAL_CAPSULE | Freq: Four times a day (QID) | ORAL | 0 refills | Status: DC | PRN
Start: 2023-09-04 — End: 2023-11-26

## 2023-09-05 DIAGNOSIS — G5603 Carpal tunnel syndrome, bilateral upper limbs: Secondary | ICD-10-CM | POA: Diagnosis not present

## 2023-09-08 ENCOUNTER — Ambulatory Visit (HOSPITAL_COMMUNITY)
Admission: RE | Admit: 2023-09-08 | Discharge: 2023-09-08 | Disposition: A | Discharge status: Home / Self Care | Payer: Medicare Other | Source: Ambulatory Visit | Attending: Physician Assistant | Admitting: Physician Assistant

## 2023-09-08 DIAGNOSIS — K7689 Other specified diseases of liver: Secondary | ICD-10-CM | POA: Diagnosis not present

## 2023-09-08 DIAGNOSIS — R1013 Epigastric pain: Secondary | ICD-10-CM | POA: Insufficient documentation

## 2023-09-08 DIAGNOSIS — N1330 Unspecified hydronephrosis: Secondary | ICD-10-CM | POA: Diagnosis not present

## 2023-09-08 DIAGNOSIS — R109 Unspecified abdominal pain: Secondary | ICD-10-CM | POA: Diagnosis not present

## 2023-09-08 DIAGNOSIS — K828 Other specified diseases of gallbladder: Secondary | ICD-10-CM | POA: Diagnosis not present

## 2023-09-08 MED ORDER — IOHEXOL 300 MG/ML  SOLN
100.0000 mL | Freq: Once | INTRAMUSCULAR | Status: AC | PRN
  Administered 2023-09-08: 100 mL via INTRAVENOUS

## 2023-09-08 MED ORDER — IOHEXOL 300 MG/ML  SOLN
30.0000 mL | Freq: Once | INTRAMUSCULAR | Status: AC | PRN
  Administered 2023-09-08: 30 mL via ORAL

## 2023-09-19 ENCOUNTER — Ambulatory Visit: Payer: Medicare Other

## 2023-09-19 ENCOUNTER — Ambulatory Visit: Payer: Medicare Other | Admitting: Emergency Medicine

## 2023-09-19 ENCOUNTER — Encounter: Payer: Self-pay | Admitting: Emergency Medicine

## 2023-09-19 VITALS — BP 110/58 | HR 71 | Temp 97.8°F | Ht 59.0 in | Wt 131.0 lb

## 2023-09-19 DIAGNOSIS — R059 Cough, unspecified: Secondary | ICD-10-CM | POA: Diagnosis not present

## 2023-09-19 DIAGNOSIS — R053 Chronic cough: Secondary | ICD-10-CM | POA: Diagnosis not present

## 2023-09-19 DIAGNOSIS — R918 Other nonspecific abnormal finding of lung field: Secondary | ICD-10-CM | POA: Diagnosis not present

## 2023-09-19 NOTE — Assessment & Plan Note (Signed)
These have been deemed stable based on serial CT imaging.  No dedicated follow-up needed unless there is a clinical change

## 2023-09-19 NOTE — Patient Instructions (Addendum)
Please continue your Zyrtec once daily Continue fluticasone nasal spray, 2 sprays each nostril each evening Continue your pantoprazole twice a day for now.  Pay attention to how your cough does with the do decrease back to once daily.  Cough can sometimes flare if reflux is active We do not need to repeat any chest imaging at this time Follow with our office as needed for any flaring symptoms or change in your cough.

## 2023-09-19 NOTE — Progress Notes (Signed)
Subjective:    Patient ID: Jennifer Hull, female    DOB: 1941-09-29, 82 y.o.   MRN: [id number]  HPI  ROV 06/22/2022 --82 year old woman with rheumatoid arthritis on tocilizumab.  Also with a history of temporal arteritis and chronic cough.  She was having flaring cough following COVID-19 in late 2021, slowly improved.  She is on therapy for GERD and chronic rhinitis.  We have been following small centrilobular pulmonary nodules on serial CT scans of the chest, have been stable over time. Fluticasone nasal spray, loratadine, nasal saline, pantoprazole She has been having more problems with joint pain and swelling on tocilizumab. Minimal cough, no sputum. She hs noticed some mild exertional dyspnea that is new. No wheeze  CT chest 06/05/2022 reviewed by me shows no mediastinal or hilar adenopathy.  There are stable scattered tiny bilateral nodules that are unchanged for over 2 years.  No dedicated follow-up required.Marland Kitchen  ROV 09/19/2023 --follow-up visit for 82 year old woman with RA on tocilizumab, temporal arteritis and chronic cough.  She had COVID-19 in late 2021 and had longstanding persistent cough afterwards she having trigger rhinitis.  Her cough and upper airway irritation flared beginning about 2 months ago when she had a severe cold that lasted for 3 weeks.  The cough was very slow to resolve.  We called in [employer]. Her cough is now much improved. She changed her loratadine to zyrtec, fluticasone NS at night. She is still on PPI, still had reflux so it was increased to bid for 3 months by GI.    Review of Systems As per HPI  Past Medical History:  Diagnosis Date   Anxiety    Arthritis    Chronic constipation    Depression    Hyperlipidemia    Hypothyroidism    Pneumonia    Rheumatoid arthritis (HCC) 2019   Temporal arteritis (HCC)    Thyroid disease      Family History  Problem Relation Age of Onset   Kidney cancer Mother    Heart disease Father    Breast cancer  Maternal Aunt    Prostate cancer Paternal Uncle    Colon cancer Neg Hx    Esophageal cancer Neg Hx    Rectal cancer Neg Hx    Stomach cancer Neg Hx    Inflammatory bowel disease Neg Hx    Liver disease Neg Hx    Pancreatic cancer Neg Hx      Social History   Socioeconomic History   Marital status: Married    Spouse name: Not on file   Number of children: 1   Years of education: Not on file   Highest education level: Not on file  Occupational History   Occupation: Nurse  Tobacco Use   Smoking status: Former    Current packs/day: 0.00    Average packs/day: 1.5 packs/day for 8.0 years (12.0 ttl pk-yrs)    Types: Cigarettes    Start date: 64    Quit date: 63    Years since quitting: 46.9   Smokeless tobacco: Never   Tobacco comments:    smoked infrequently when she was young; quit 82 yo   Vaping Use   Vaping status: Never Used  Substance and Sexual Activity   Alcohol use: No   Drug use: No   Sexual activity: Not Currently    Partners: Male    Birth control/protection: Surgical  Other Topics Concern   Not on file  Social History Narrative   Not on file  Social Determinants of Health   Financial Resource Strain: Low Risk  (04/04/2022)   Overall Financial Resource Strain (CARDIA)    Difficulty of Paying Living Expenses: Not hard at all  Food Insecurity: Low Risk  (05/28/2023)   Received from [facility]   Hunger Vital Sign    Worried About Running Out of Food in the Last Year: Never true    Ran Out of Food in the Last Year: Never true  Transportation Needs: Not on file (05/28/2023)  Physical Activity: Inactive (04/04/2022)   Exercise Vital Sign    Days of Exercise per Week: 0 days    Minutes of Exercise per Session: 0 min  Stress: No Stress Concern Present (04/04/2022)   [employer] of Occupational Health - Occupational Stress Questionnaire    Feeling of Stress : Not at all  Social Connections: Not on file  Intimate Partner Violence: Not on file     From [place], has also lived in [place], , [place] No known inhaled exposures.   Allergies  Allergen Reactions   Tdap [Tetanus-Diphth-Acell Pertussis] Other (See Comments)    SEVERE ALLERGIC REACTIONS (NECK STIFFNESS/HIGH FEVER/ETC)   Tetanus Toxoid, Adsorbed     Neck stiffness, and high fever Other reaction(s): Other (See Comments) Neck stiffness, and high fever   Hydroxychloroquine Sulfate Rash   Covid-19 (Mrna) Vaccine Proofreader) [Covid-19 (Mrna) Vaccine] Rash     Outpatient Medications Prior to Visit  Medication Sig Dispense Refill   acetaminophen (TYLENOL) 500 MG tablet Take 1,000 mg by mouth every 8 (eight) hours as needed for moderate pain.     atorvastatin (LIPITOR) 10 MG tablet Take 1 tablet (10 mg total) by mouth daily. 90 tablet 2   benzonatate (TESSALON) 100 MG capsule Take 1 capsule (100 mg total) by mouth every 6 (six) hours as needed for cough. 30 capsule 0   bisacodyl (DULCOLAX) 10 MG suppository Every 2 days as needed. 30 suppository 1   Calcium Carb-Cholecalciferol (CALCIUM 600+D3 PO) Take 1 tablet by mouth daily with lunch.     cholecalciferol (VITAMIN D3) 25 MCG (1000 UNIT) tablet Take 4,000 Units by mouth daily.     estradiol (ESTRACE) 0.1 MG/GM vaginal cream Place 0.5 g vaginally 2 (two) times a week. Place 0.5g nightly for two weeks then twice a week after 30 g 2   folic acid (FOLVITE) 800 MCG tablet Take 800 mcg by mouth daily.     levothyroxine (SYNTHROID) 112 MCG tablet TAKE 1 TABLET(112 MCG) BY MOUTH DAILY BEFORE BREAKFAST 90 tablet 2   linaclotide (LINZESS) 290 MCG CAPS capsule Take 1 capsule (290 mcg total) by mouth daily before breakfast. 30 capsule 6   loratadine (CLARITIN) 10 MG tablet TAKE 1 TABLET(10 MG) BY MOUTH DAILY 30 tablet 11   Melatonin 10 MG CAPS Take 10 mg by mouth at bedtime.     pantoprazole (PROTONIX) 40 MG tablet Take 1 tablet (40 mg total) by mouth 2 (two) times daily before a meal. 60 tablet 3   polyethylene glycol (MIRALAX / GLYCOLAX) 17 g  packet Take 17 g by mouth daily.     sertraline (ZOLOFT) 100 MG tablet TAKE 1 AND 1/2 TABLETS BY MOUTH EVERY DAY (Patient taking differently: Take 150 mg by mouth daily. Pt taking 150 mg) 135 tablet 2   sucralfate (CARAFATE) 1 GM/10ML suspension Take 1 g by mouth 2 (two) times daily. As needed.     temazepam (RESTORIL) 15 MG capsule TAKE 1 CAPSULE BY MOUTH AT BEDTIME AS NEEDED FOR  SLEEP 30 capsule 3   Tocilizumab (ACTEMRA IV) Inject into the vein every 30 (thirty) days.     TURMERIC CURCUMIN PO Take 3,000 mg by mouth daily.     zinc gluconate 50 MG tablet Take 50 mg by mouth daily.     magnesium oxide (MAG-OX) 400 MG tablet Take 400 mg by mouth 2 (two) times daily.     No facility-administered medications prior to visit.        Objective:   Physical Exam Vitals:   09/19/23 1414  BP: (!) 110/58  Pulse: 71  Temp: 97.8 F (36.6 C)  TempSrc: Oral  SpO2: 96%  Weight: 131 lb (59.4 kg)  Height: 4\' 11"  (1.499 m)   Gen: Pleasant, well-nourished, in no distress,  normal affect  ENT: No lesions,  mouth clear,  oropharynx clear, no postnasal drip  Neck: No JVD, no stridor  Lungs: No use of accessory muscles, no crackles or wheezing on normal respiration, no wheeze on forced expiration  Cardiovascular: RRR, heart sounds normal, no murmur or gallops, no peripheral edema  Musculoskeletal: No deformities, no cyanosis or clubbing  Neuro: alert, awake, non focal  Skin: Warm, no lesions or rash     Assessment & Plan:  Chronic cough With a recent flare brought on by URI about 2 months ago.  She did not get any relief until she got on [employer].  Chest today reassuring.  Of note her PPI was also increased to twice daily during this period of time which may have been beneficial.  She remains on fluticasone, Zyrtec (change from Claritin).  Plan to continue to follow her.  When her PPI is decreased back to once daily I have asked her to pay attention to how her cough does because GERD be  impacted.  Pulmonary nodules These have been deemed stable based on serial CT imaging.  No dedicated follow-up needed unless there is a clinical change   Levy Pupa, MD, PhD 09/19/2023, 2:41 PM [facility] [phone] or if no answer before 7:00PM call [phone] For any issues after 7:00PM please call eLink [phone]

## 2023-09-19 NOTE — Assessment & Plan Note (Addendum)
With a recent flare brought on by URI about 2 months ago.  She did not get any relief until she got on [employer].  Chest today reassuring.  Of note her PPI was also increased to twice daily during this period of time which may have been beneficial.  She remains on fluticasone, Zyrtec (change from Claritin).  Plan to continue to follow her.  When her PPI is decreased back to once daily I have asked her to pay attention to how her cough does because GERD be impacted.

## 2023-09-20 DIAGNOSIS — R5383 Other fatigue: Secondary | ICD-10-CM | POA: Diagnosis not present

## 2023-09-20 DIAGNOSIS — M0579 Rheumatoid arthritis with rheumatoid factor of multiple sites without organ or systems involvement: Secondary | ICD-10-CM | POA: Diagnosis not present

## 2023-09-20 DIAGNOSIS — Z79899 Other long term (current) drug therapy: Secondary | ICD-10-CM | POA: Diagnosis not present

## 2023-09-25 ENCOUNTER — Encounter: Payer: Self-pay | Admitting: Gastroenterology

## 2023-10-12 ENCOUNTER — Encounter: Payer: Self-pay | Admitting: Obstetrics

## 2023-10-19 ENCOUNTER — Other Ambulatory Visit (HOSPITAL_COMMUNITY)
Admission: RE | Admit: 2023-10-19 | Discharge: 2023-10-19 | Disposition: A | Discharge status: Home / Self Care | Payer: Medicare Other | Source: Ambulatory Visit | Attending: Obstetrics and Gynecology | Admitting: Obstetrics and Gynecology

## 2023-10-19 ENCOUNTER — Encounter: Payer: Self-pay | Admitting: Obstetrics and Gynecology

## 2023-10-19 ENCOUNTER — Ambulatory Visit (INDEPENDENT_AMBULATORY_CARE_PROVIDER_SITE_OTHER): Payer: Medicare Other | Admitting: Obstetrics and Gynecology

## 2023-10-19 VITALS — BP 124/68 | HR 66

## 2023-10-19 DIAGNOSIS — R82998 Other abnormal findings in urine: Secondary | ICD-10-CM

## 2023-10-19 DIAGNOSIS — N898 Other specified noninflammatory disorders of vagina: Secondary | ICD-10-CM | POA: Insufficient documentation

## 2023-10-19 DIAGNOSIS — N810 Urethrocele: Secondary | ICD-10-CM

## 2023-10-19 DIAGNOSIS — R8289 Other abnormal findings on cytological and histological examination of urine: Secondary | ICD-10-CM | POA: Diagnosis present

## 2023-10-19 DIAGNOSIS — R350 Frequency of micturition: Secondary | ICD-10-CM

## 2023-10-19 DIAGNOSIS — N368 Other specified disorders of urethra: Secondary | ICD-10-CM

## 2023-10-19 LAB — POCT URINALYSIS DIPSTICK
Bilirubin, UA: NEGATIVE
Glucose, UA: NEGATIVE
Ketones, UA: NEGATIVE
Nitrite, UA: NEGATIVE
Protein, UA: NEGATIVE
Spec Grav, UA: 1.015 (ref 1.010–1.025)
Urobilinogen, UA: 0.2 U/dL
pH, UA: 6 (ref 5.0–8.0)

## 2023-10-19 MED ORDER — FLUCONAZOLE 150 MG PO TABS
150.0000 mg | ORAL_TABLET | Freq: Once | ORAL | 0 refills | Status: AC
Start: 2023-10-19 — End: 2023-10-19

## 2023-10-19 MED ORDER — SULFAMETHOXAZOLE-TRIMETHOPRIM 800-160 MG PO TABS
1.0000 | ORAL_TABLET | Freq: Two times a day (BID) | ORAL | 0 refills | Status: AC
Start: 2023-10-19 — End: 2023-10-22

## 2023-10-19 NOTE — Patient Instructions (Addendum)
 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   You had some concerning signs of a possible UTI. I want you to start antibiotics and there is a prescribed dose of Diflucan  for after completion of antibiotics. You may not need this but I wanted you to have it just in case your swab is positive for yeast.

## 2023-10-19 NOTE — Progress Notes (Signed)
 [facility] Return Visit  SUBJECTIVE  History of Present Illness: Hanan G Cocozza is a 83 y.o. female seen in follow-up for Urethral prolapse, vaginal irritation, and concerns for UTI.     Past Medical History: Patient  has a past medical history of Anxiety, Arthritis, Chronic constipation, Depression, Hyperlipidemia, Hypothyroidism, Pneumonia, Rheumatoid arthritis (HCC) (2019), Temporal arteritis (HCC), and Thyroid  disease.   Past Surgical History: She  has a past surgical history that includes Abdominal hysterectomy; Tonsillectomy; Breast surgery; Breast excisional biopsy (Left); Colonoscopy; Total shoulder arthroplasty (Right, 11/22/2017); Temporal artery biopsy / ligation (Bilateral, 2014); Reverse shoulder arthroplasty (Left, 06/09/2021); Shoulder surgery (Left, 06/09/2021); and Joint replacement.   Medications: She has a current medication list which includes the following prescription(s): acetaminophen , atorvastatin , benzonatate , bisacodyl , calcium  carb-cholecalciferol, cholecalciferol, estradiol , fluconazole , folic acid , levothyroxine , linaclotide , loratadine , melatonin, pantoprazole , polyethylene glycol, sertraline , sucralfate , sulfamethoxazole -trimethoprim , temazepam , tocilizumab , turmeric, and zinc gluconate.   Allergies: Patient is allergic to tdap [tetanus-diphth-acell pertussis]; tetanus toxoid, adsorbed; hydroxychloroquine sulfate; and covid-19 (mrna) vaccine (pfizer) [covid-19 (mrna) vaccine].   Social History: Patient  reports that she quit smoking about 47 years ago. Her smoking use included cigarettes. She started smoking about 55 years ago. She has a 12 pack-year smoking history. She has never used smokeless tobacco. She reports that she does not drink alcohol and does not use drugs.     OBJECTIVE    Lab Results  Component Value Date   COLORU Yellow 08/28/2023   CLARITYU Clear 08/28/2023   GLUCOSEUR Negative 08/28/2023   BILIRUBINUR Negative  08/28/2023   KETONESU Negative 08/28/2023   SPECGRAV 1.010 08/28/2023   RBCUR Negative 08/28/2023   PHUR 6.5 08/28/2023   PROTEINUR Negative 08/28/2023   UROBILINOGEN 0.2 08/28/2023   LEUKOCYTESUR Negative 08/28/2023     Physical Exam: Vitals:   10/19/23 1109  BP: 124/68  Pulse: 66   Gen: No apparent distress, A&O x 3.  Detailed Urogynecologic Evaluation: Vaginal exam reveals no bleeding, mass, or openings to vaginal skin.  The opening of the vagina had some white flakes on it.  Hard to determine if this is yeast or if this is estrogen cream use.  There is some inflammation noted around the internal portion of the labia as well.  Urethral prolapse appears to be stable at this time but is no active bleeding.    ASSESSMENT AND PLAN    Ms. Sermons is a 83 y.o. with:  1. Urethral prolapse   2. Vaginal irritation   3. Leukocytes in urine    Patient was originally coming to see me as she was having bleeding from her urethral prolapse.  She has done estrogen cream nightly for the last 2 weeks to help this.  She reports that the irritation at the urethra has seemed to subside. For patient's vaginal irritation Aptima Swab Today to Rule out BV or Yeast As Patient Reports She Recently Had an Issue with Her Water  and There Was Concern for Bacteria in the Water . For patient's urinalysis today there were leukocytes noted in the urine as well as trace blood.  Will send this for culture.  Due to her advanced age and the impending poor weather conditions this weekend we will send in antibiotics and a single dose of Diflucan .  Discussed with patient that would prefer to wait for the swab to return before taking Diflucan  to which she is agreeable.  Patient to follow-up with Dr. Guadlupe to discuss further treatment of her urethral prolapse and continued management.  Patient to follow-up  in 4 to 6 weeks.    Erik Burkett G Josef Tourigny, NP

## 2023-10-20 LAB — URINE CULTURE

## 2023-10-22 ENCOUNTER — Encounter: Payer: Self-pay | Admitting: Obstetrics and Gynecology

## 2023-10-22 ENCOUNTER — Ambulatory Visit: Payer: Medicare Other

## 2023-10-22 LAB — CERVICOVAGINAL ANCILLARY ONLY
Bacterial Vaginitis (gardnerella): NEGATIVE
Candida Glabrata: NEGATIVE
Candida Vaginitis: NEGATIVE
Comment: NEGATIVE
Comment: NEGATIVE
Comment: NEGATIVE

## 2023-10-25 DIAGNOSIS — G5601 Carpal tunnel syndrome, right upper limb: Secondary | ICD-10-CM | POA: Diagnosis not present

## 2023-10-25 DIAGNOSIS — Z4789 Encounter for other orthopedic aftercare: Secondary | ICD-10-CM | POA: Diagnosis not present

## 2023-11-01 ENCOUNTER — Ambulatory Visit: Payer: Medicare Other | Admitting: Physician Assistant

## 2023-11-01 ENCOUNTER — Encounter: Payer: Self-pay | Admitting: Physician Assistant

## 2023-11-01 VITALS — BP 110/50 | HR 72 | Ht 59.0 in | Wt 134.4 lb

## 2023-11-01 DIAGNOSIS — K5909 Other constipation: Secondary | ICD-10-CM

## 2023-11-01 DIAGNOSIS — K219 Gastro-esophageal reflux disease without esophagitis: Secondary | ICD-10-CM

## 2023-11-01 DIAGNOSIS — R1013 Epigastric pain: Secondary | ICD-10-CM

## 2023-11-01 NOTE — Progress Notes (Signed)
Chief Complaint: Follow-up constipation  HPI:    Jennifer Hull is an a 83 year old female with a past medical history as listed below including chronic constipation, rheumatoid arthritis and multiple others, known to Dr. Meridee Score, who returns to clinic today for follow-up of her chronic constipation.      06/21/2022 patient seen in clinic by Dr. Meridee Score.  That time following up.  She continues to struggle with constipation.  She was given samples of Ibsrela which was not effective.  Her regimen at that time had continued on Trulance daily and MiraLAX once a day with Dulcolax every 5 days and a glycerin suppository.  Also new upper abdominal pain.  She was given a GI cocktail and Carafate at the urgent care with improvement.  Discussed finding of mild gastritis on EGD.  Refilled Carafate twice daily.  Given a trial Linzess 290.  Recommended abdominal ultrasound.    06/29/2022 abdominal ultrasound with fatty liver and 8 mm cyst and otherwise normal.  Discussed possible CT scan if continues with pain.    02/24/2023 Dr. Meridee Score was okay with her taking a half of a bowel prep once a month is a good cleanout.  Recommended Anusol suppositories for hemorrhoids.  Discussed possible banding.    07/16/2023 patient seen in clinic and at that time was having episodes of epigastric discomfort and bloating with early satiety, using Carafate as needed as well as Pantoprazole 40 mg daily.  Still somewhat constipated requiring multiple laxatives.  At that time checking on the status of Linzess, increase Pantoprazole to 40 mg twice a day and continue Carafate 4 times daily as needed.  Also provided with instructions for MiraLAX bowel prep she could do once a month if she felt backed up.    09/16/2023 CT of the abdomen pelvis showed diffuse hepatic steatosis, moderate volume of formed stool in the colon.    Today, the patient presents to clinic and tells me that she is doing much better.  Previously was requiring her  Pantoprazole 40 mg twice a day and Carafate which she has stopped requiring the Carafate, still on Pantoprazole twice a day but thinks she can back down to once a day again.    As far as chronic constipation she is doing much better on Linzess 290 mcg a day and daily MiraLAX.  In fact she is going to try and increase this to twice daily +/- some stool softeners as needed for any breakthrough abdominal pain or constipation symptoms.  She recently has been to see a urogynecologist for a urethral prolapse which they say is due to all of her straining from constipation so she is really trying to not strain at the moment.    In general patient is doing well with all of her chronic issues and does not want any other medication adjustments at the moment.    Denies fever, chills or weight loss.     Past Medical History:  Diagnosis Date   Anxiety    Arthritis    Chronic constipation    Depression    Hyperlipidemia    Hypothyroidism    Pneumonia    Rheumatoid arthritis (HCC) 2019   Temporal arteritis (HCC)    Thyroid disease     Past Surgical History:  Procedure Laterality Date   ABDOMINAL HYSTERECTOMY     BREAST EXCISIONAL BIOPSY Left    BREAST SURGERY     biopsy   COLONOSCOPY     JOINT REPLACEMENT     REVERSE SHOULDER  ARTHROPLASTY Left 06/09/2021   Procedure: REVERSE SHOULDER ARTHROPLASTY;  Surgeon: Jones Broom, MD;  Location: [facility];  Service: Orthopedics;  Laterality: Left;   SHOULDER SURGERY Left 06/09/2021   TEMPORAL ARTERY BIOPSY / LIGATION Bilateral 2014   TONSILLECTOMY     TOTAL SHOULDER ARTHROPLASTY Right 11/22/2017   Procedure: RIGHT REVERSE TOTAL SHOULDER ARTHROPLASTY;  Surgeon: Jones Broom, MD;  Location: [facility];  Service: Orthopedics;  Laterality: Right;    Current Outpatient Medications  Medication Sig Dispense Refill   acetaminophen (TYLENOL) 500 MG tablet Take 1,000 mg by mouth every 8 (eight) hours as needed for moderate pain.     atorvastatin (LIPITOR) 10 MG  tablet Take 1 tablet (10 mg total) by mouth daily. 90 tablet 2   benzonatate (TESSALON) 100 MG capsule Take 1 capsule (100 mg total) by mouth every 6 (six) hours as needed for cough. 30 capsule 0   bisacodyl (DULCOLAX) 10 MG suppository Every 2 days as needed. 30 suppository 1   Calcium Carb-Cholecalciferol (CALCIUM 600+D3 PO) Take 1 tablet by mouth daily with lunch.     cholecalciferol (VITAMIN D3) 25 MCG (1000 UNIT) tablet Take 4,000 Units by mouth daily.     estradiol (ESTRACE) 0.1 MG/GM vaginal cream Place 0.5 g vaginally 2 (two) times a week. Place 0.5g nightly for two weeks then twice a week after 30 g 2   folic acid (FOLVITE) 800 MCG tablet Take 800 mcg by mouth daily.     levothyroxine (SYNTHROID) 112 MCG tablet TAKE 1 TABLET(112 MCG) BY MOUTH DAILY BEFORE BREAKFAST 90 tablet 2   linaclotide (LINZESS) 290 MCG CAPS capsule Take 1 capsule (290 mcg total) by mouth daily before breakfast. 30 capsule 6   loratadine (CLARITIN) 10 MG tablet TAKE 1 TABLET(10 MG) BY MOUTH DAILY 30 tablet 11   Melatonin 10 MG CAPS Take 10 mg by mouth at bedtime.     pantoprazole (PROTONIX) 40 MG tablet Take 1 tablet (40 mg total) by mouth 2 (two) times daily before a meal. 60 tablet 3   polyethylene glycol (MIRALAX / GLYCOLAX) 17 g packet Take 17 g by mouth daily.     sertraline (ZOLOFT) 100 MG tablet TAKE 1 AND 1/2 TABLETS BY MOUTH EVERY DAY (Patient taking differently: Take 150 mg by mouth daily. Pt taking 150 mg) 135 tablet 2   sucralfate (CARAFATE) 1 GM/10ML suspension Take 1 g by mouth 2 (two) times daily. As needed.     temazepam (RESTORIL) 15 MG capsule TAKE 1 CAPSULE BY MOUTH AT BEDTIME AS NEEDED FOR SLEEP 30 capsule 3   Tocilizumab (ACTEMRA IV) Inject into the vein every 30 (thirty) days.     TURMERIC CURCUMIN PO Take 3,000 mg by mouth daily.     zinc gluconate 50 MG tablet Take 50 mg by mouth daily.     No current facility-administered medications for this visit.    Allergies as of 11/01/2023 - Review  Complete 10/19/2023  Allergen Reaction Noted   Tdap [tetanus-diphth-acell pertussis] Other (See Comments) 07/27/2016   Tetanus toxoid, adsorbed  12/07/2017   Hydroxychloroquine sulfate Rash 06/02/2021   Covid-19 (mrna) vaccine (pfizer) [covid-19 (mrna) vaccine] Rash 05/13/2021    Family History  Problem Relation Age of Onset   Kidney cancer Mother    Heart disease Father    Breast cancer Maternal Aunt    Prostate cancer Paternal Uncle    Colon cancer Neg Hx    Esophageal cancer Neg Hx    Rectal cancer Neg Hx  Stomach cancer Neg Hx    Inflammatory bowel disease Neg Hx    Liver disease Neg Hx    Pancreatic cancer Neg Hx     Social History   Socioeconomic History   Marital status: Married    Spouse name: Not on file   Number of children: 1   Years of education: Not on file   Highest education level: Not on file  Occupational History   Occupation: Nurse  Tobacco Use   Smoking status: Former    Current packs/day: 0.00    Average packs/day: 1.5 packs/day for 8.0 years (12.0 ttl pk-yrs)    Types: Cigarettes    Start date: 40    Quit date: 53    Years since quitting: 47.0   Smokeless tobacco: Never   Tobacco comments:    smoked infrequently when she was young; quit 83 yo   Vaping Use   Vaping status: Never Used  Substance and Sexual Activity   Alcohol use: No   Drug use: No   Sexual activity: Not Currently    Partners: Male    Birth control/protection: Surgical  Other Topics Concern   Not on file  Social History Narrative   Not on file   Social Drivers of Health   Financial Resource Strain: Low Risk  (04/04/2022)   Overall Financial Resource Strain (CARDIA)    Difficulty of Paying Living Expenses: Not hard at all  Food Insecurity: Low Risk  (05/28/2023)   Received from [facility]   Hunger Vital Sign    Worried About Running Out of Food in the Last Year: Never true    Ran Out of Food in the Last Year: Never true  Transportation Needs: Not on file  (05/28/2023)  Physical Activity: Inactive (04/04/2022)   Exercise Vital Sign    Days of Exercise per Week: 0 days    Minutes of Exercise per Session: 0 min  Stress: No Stress Concern Present (04/04/2022)   [employer] of Occupational Health - Occupational Stress Questionnaire    Feeling of Stress : Not at all  Social Connections: Not on file  Intimate Partner Violence: Not on file    Review of Systems:    Constitutional: No weight loss, fever or chills Cardiovascular: No chest pain Respiratory: No SOB Gastrointestinal: See HPI and otherwise negative   Physical Exam:  Vital signs: BP (!) 110/50 (BP Location: Left Arm, Patient Position: Sitting, Cuff Size: Normal)   Pulse 72   Ht 4\' 11"  (1.499 m)   Wt 134 lb 6 oz (61 kg)   BMI 27.14 kg/m    Constitutional:   Pleasant elderly female appears to be in NAD, Well developed, Well nourished, alert and cooperative Respiratory: Respirations even and unlabored. Lungs clear to auscultation bilaterally.   No wheezes, crackles, or rhonchi.  Cardiovascular: Normal S1, S2. No MRG. Regular rate and rhythm. No peripheral edema, cyanosis or pallor.  Gastrointestinal:  Soft, nondistended, nontender. No rebound or guarding. Normal bowel sounds. No appreciable masses or hepatomegaly. Rectal:  Not performed.  Psychiatric: Oriented to person, place and time. Demonstrates good judgement and reason without abnormal affect or behaviors.  RELEVANT LABS AND IMAGING: CBC    Component Value Date/Time   WBC 5.3 05/05/2023 1313   RBC 4.14 05/05/2023 1313   HGB 13.2 05/05/2023 1313   HCT 37.9 05/05/2023 1313   PLT 184 05/05/2023 1313   MCV 91.5 05/05/2023 1313   MCH 31.9 05/05/2023 1313   MCHC 34.8 05/05/2023 1313  RDW 12.4 05/05/2023 1313   LYMPHSABS 1.4 05/05/2023 1313   MONOABS 0.6 05/05/2023 1313   EOSABS 0.2 05/05/2023 1313   BASOSABS 0.1 05/05/2023 1313    CMP     Component Value Date/Time   NA 133 (L) 05/05/2023 1313   NA 144  03/23/2021 0000   K 4.0 05/05/2023 1313   CL 101 05/05/2023 1313   CO2 25 05/05/2023 1313   GLUCOSE 83 05/09/2023 0806   BUN 21 05/05/2023 1313   BUN 16 03/23/2021 0000   CREATININE 0.63 05/05/2023 1313   CALCIUM 9.2 05/05/2023 1313   PROT 6.2 (L) 04/27/2022 2344   ALBUMIN 4.2 04/27/2022 2344   AST 21 04/27/2022 2344   ALT 14 04/27/2022 2344   ALKPHOS 39 04/27/2022 2344   BILITOT 0.4 04/27/2022 2344   GFRNONAA >60 05/05/2023 1313   GFRAA >60 11/23/2017 0710    Assessment: 1.  Epigastric pain/GERD: Improved on Carafate and Pantoprazole, has since discontinued Carafate and continues Pantoprazole twice a day but would like to try decreasing back down, CT with no etiology; likely gas Tritus 2.  Chronic constipation: Currently maintained on Linzess 290 mcg daily and MiraLAX daily, sometimes twice daily with stool softeners  Plan: 1.  Reviewed recent CT results with her.  Apparently she had not gotten a phone call. 2.  Encouraged the patient to continue her Linzess 290 mcg daily as well as increase MiraLAX to twice daily to avoid any straining.  She can use stool softeners as well if needed. 3.  Patient will try to decrease her Pantoprazole back to once a day.  Discussed how to do this slowly over the next week. 4.  Patient to follow in clinic with me in 4 months or sooner if necessary.  Hyacinth Meeker, PA-C [facility] 11/01/2023, 2:11 PM  Cc: [place], Betty G, MD

## 2023-11-01 NOTE — Patient Instructions (Addendum)
_______________________________________________________  If your blood pressure at your visit was 140/90 or greater, please contact your primary care physician to follow up on this.  _______________________________________________________  If you are age 82 or older, your body mass index should be between 23-30. Your Body mass index is 27.14 kg/m. If this is out of the aforementioned range listed, please consider follow up with your Primary Care Provider.  If you are age 39 or younger, your body mass index should be between 19-25. Your Body mass index is 27.14 kg/m. If this is out of the aformentioned range listed, please consider follow up with your Primary Care Provider.   ________________________________________________________  The [facility] providers would like to encourage you to use [facility] to communicate with providers for non-urgent requests or questions.  Due to long hold times on the telephone, sending your provider a message by [facility] may be a faster and more efficient way to get a response.  Please allow 48 business hours for a response.  Please remember that this is for non-urgent requests.  _______________________________________________________  Berneda RoseKarlene Einstein, Miralax twice daily, and pantoprazole  You will need a follow up in May 2025.  Thank you for entrusting me with your care and choosing [facility].  Hyacinth Meeker, PA-C

## 2023-11-02 ENCOUNTER — Ambulatory Visit: Payer: Medicare Other | Admitting: Obstetrics

## 2023-11-07 DIAGNOSIS — M0579 Rheumatoid arthritis with rheumatoid factor of multiple sites without organ or systems involvement: Secondary | ICD-10-CM | POA: Diagnosis not present

## 2023-11-08 DIAGNOSIS — M25631 Stiffness of right wrist, not elsewhere classified: Secondary | ICD-10-CM | POA: Diagnosis not present

## 2023-11-09 NOTE — Progress Notes (Signed)
Attending Physician's Attestation   I have reviewed the chart.   I agree with the Advanced Practitioner's note, impression, and recommendations with any updates as below. Glad to hear she is somewhat doing better.   Corliss Parish, MD [facility] Office # [phone]

## 2023-11-26 ENCOUNTER — Encounter: Payer: Self-pay | Admitting: Obstetrics

## 2023-11-26 ENCOUNTER — Ambulatory Visit (INDEPENDENT_AMBULATORY_CARE_PROVIDER_SITE_OTHER): Payer: Medicare Other | Admitting: Obstetrics

## 2023-11-26 VITALS — BP 122/71 | HR 64

## 2023-11-26 DIAGNOSIS — M6289 Other specified disorders of muscle: Secondary | ICD-10-CM

## 2023-11-26 DIAGNOSIS — K5900 Constipation, unspecified: Secondary | ICD-10-CM

## 2023-11-26 DIAGNOSIS — N368 Other specified disorders of urethra: Secondary | ICD-10-CM

## 2023-11-26 NOTE — Assessment & Plan Note (Signed)
-   reviewed anatomy and provided handout for urethral prolapse - UA negative and prior bladder scan 89mL  - discussed management options including expectant management, medical management, and surgical intervention.  - intermittent irritation resolved with loading dose of vaginal estrogen, reports some return of symptoms 2x/week. Increase to 1g 3x/week and encouraged to continue application with finger due to myofascial pain. Repeat exam in 4-6 weeks.  - discussed symptoms that warrant immediate evaluation and intervention if she develops pain, bleeding, or inability to void. Pt desires to avoid surgical intervention.

## 2023-11-26 NOTE — Patient Instructions (Addendum)
Increase vaginal estrogen therapy 3 times weekly at night up to 1g using your finger. This can be placed with your finger or an applicator inside the vagina and around the urethra.    Please return to the office if you experience any pain, bleeding, or sensation of incomplete emptying.   Continue miralax for your bowel movements.   Please consider pelvic floor physical therapy for your pelvic floor muscle pain on exam.

## 2023-11-26 NOTE — Assessment & Plan Note (Signed)
-   pressure and discomfort with palpation of left sided pelvic floor muscles and puborectalis likely due to chronic constipation and straining - encouraged squatting position for BM and continue miralax to optimize stool consistency - encouraged to consider pelvic floor PT due to persistent pain with palpation of pelvic floor muscle

## 2023-11-26 NOTE — Assessment & Plan Note (Signed)
-   chronic constipation since childhood - MiraLAX BID, mag oxide, Linzess to 90 mcg, Dulcolax suppository and pills PRN started 3 years ago - For constipation, we reviewed the importance of a better bowel regimen.  We also discussed the importance of avoiding chronic straining, as it can exacerbate her pelvic floor symptoms; we discussed treating constipation and straining prior to surgery, as postoperative straining can lead to damage to the repair and recurrence of symptoms. We discussed initiating therapy with increasing fluid intake, fiber supplementation, stool softeners, and laxatives such as miralax.  - encouraged to continue Miralax due to improvement in stool consistency and avoid straining due to exacerbation of prolapse symptoms.  - encouraged squatty potty use and pelvic relaxation

## 2023-11-26 NOTE — Progress Notes (Signed)
[facility] Return Visit  SUBJECTIVE  History of Present Illness: Jennifer Hull is a 83 y.o. female seen in follow-up for urethral prolapse, IBS-C, mixed urinary incontinence, high tone pelvic floor. Plan at last visit was increase vaginal estrogen dosing.   Declines spanish interpreter  Started Premarin 1g 2x/week due to pharmacy instructions,  since appt 10/19/23 started loading dose nightly x 1 week and then maintenance 1g 2x/week with resolution of burning and bleeding.  Intermittent brown discharge and reports larger urethral bulge. S/p hysterectomy Emptying bladder.  Consider pelvic floor PT UA 10/19/23 trace heme, culture with multiple species. Negative Nuswab  Past Medical History: Patient  has a past medical history of Anxiety, Arthritis, Chronic constipation, Depression, Hyperlipidemia, Hypothyroidism, Pneumonia, Rheumatoid arthritis (HCC) (2019), Temporal arteritis (HCC), and Thyroid disease.   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   Medications: She has a current medication list which includes the following prescription(s): acetaminophen, atorvastatin, bisacodyl, calcium carb-cholecalciferol, cetirizine, cholecalciferol, estradiol, folic acid, levothyroxine, linaclotide, melatonin, meloxicam, pantoprazole, polyethylene glycol, pyridoxine hcl, sertraline, sucralfate, temazepam, tocilizumab, turmeric, and zinc gluconate.   Allergies: Patient is allergic to tdap [tetanus-diphth-acell pertussis]; tetanus toxoid, adsorbed; hydroxychloroquine sulfate; and covid-19 (mrna) vaccine (pfizer) [covid-19  (mrna) vaccine].   Social History: Patient  reports that she quit smoking about 47 years ago. Her smoking use included cigarettes. She started smoking about 55 years ago. She has a 12 pack-year smoking history. She has never used smokeless tobacco. She reports that she does not drink alcohol and does not use drugs.     OBJECTIVE     Physical Exam: Vitals:   11/26/23 1051  BP: 122/71  Pulse: 64   Gen: No apparent distress, A&O x 3.  Physical Exam Constitutional:      General: She is not in acute distress.    Appearance: Normal appearance.  Genitourinary:     Right Labia: No rash, tenderness, lesions, skin changes or Bartholin's cyst.    Left Labia: No tenderness, lesions, skin changes, Bartholin's cyst or rash.    No vaginal discharge, erythema or bleeding.     Urethral prolapse present.     No urethral tenderness, mass, hypermobility or discharge present.     Levator ani is tender.  Cardiovascular:     Rate and Rhythm: Normal rate.  Pulmonary:     Effort: Pulmonary effort is normal. No respiratory distress.  Neurological:     Mental Status: She is alert.  Vitals reviewed. Exam conducted with a chaperone present.        No data to display             ASSESSMENT AND PLAN    [name] is a 83 y.o. with:  1. Urethral prolapse   2. High-tone pelvic floor dysfunction   3. Constipation, unspecified constipation type     Urethral prolapse Assessment & Plan: - reviewed anatomy and provided handout for urethral prolapse - UA negative and prior bladder scan 89mL  - discussed management options including expectant management, medical management, and surgical intervention.  - intermittent irritation resolved with loading dose of vaginal estrogen, reports some return of symptoms 2x/week. Increase to 1g 3x/week and encouraged to continue application with finger due to myofascial pain. Repeat exam in  4-6 weeks.  - discussed symptoms that warrant immediate evaluation and  intervention if she develops pain, bleeding, or inability to void. Pt desires to avoid surgical intervention.   High-tone pelvic floor dysfunction Assessment & Plan: - pressure and discomfort with palpation of left sided pelvic floor muscles and puborectalis likely due to chronic constipation and straining - encouraged squatting position for BM and continue miralax to optimize stool consistency - encouraged to consider pelvic floor PT due to persistent pain with palpation of pelvic floor muscle   Constipation, unspecified constipation type Assessment & Plan: - chronic constipation since childhood - MiraLAX BID, mag oxide, Linzess to 90 mcg, Dulcolax suppository and pills PRN started 3 years ago - For constipation, we reviewed the importance of a better bowel regimen.  We also discussed the importance of avoiding chronic straining, as it can exacerbate her pelvic floor symptoms; we discussed treating constipation and straining prior to surgery, as postoperative straining can lead to damage to the repair and recurrence of symptoms. We discussed initiating therapy with increasing fluid intake, fiber supplementation, stool softeners, and laxatives such as miralax.  - encouraged to continue Miralax due to improvement in stool consistency and avoid straining due to exacerbation of prolapse symptoms.  - encouraged squatty potty use and pelvic relaxation   Time spent: I spent 21 minutes dedicated to the care of this patient on the date of this encounter to include pre-visit review of records, face-to-face time with the patient discussing urethral prolapse, high tone pelvic floor, constipation, and post visit documentation.    Loleta Chance, MD

## 2023-11-27 ENCOUNTER — Other Ambulatory Visit: Payer: Self-pay | Admitting: Family Medicine

## 2023-11-27 DIAGNOSIS — G4700 Insomnia, unspecified: Secondary | ICD-10-CM

## 2023-11-28 ENCOUNTER — Ambulatory Visit: Payer: Medicare Other

## 2023-11-29 ENCOUNTER — Ambulatory Visit: Payer: Medicare Other

## 2023-12-03 ENCOUNTER — Encounter: Payer: Self-pay | Admitting: Obstetrics

## 2023-12-03 ENCOUNTER — Other Ambulatory Visit: Payer: Self-pay | Admitting: Obstetrics

## 2023-12-03 MED ORDER — FLUCONAZOLE 150 MG PO TABS: 150.0000 mg | ORAL_TABLET | Freq: Once | ORAL | 1 refills | Status: AC

## 2023-12-03 NOTE — Progress Notes (Signed)
 Rx diflucan sent to [facility] for presumed yeast infection.  Patient to return for evaluation and vaginal testing if no improvement or worsening symptoms.

## 2023-12-04 ENCOUNTER — Encounter: Payer: Self-pay | Admitting: Obstetrics

## 2023-12-06 DIAGNOSIS — R5383 Other fatigue: Secondary | ICD-10-CM | POA: Diagnosis not present

## 2023-12-06 DIAGNOSIS — Z79899 Other long term (current) drug therapy: Secondary | ICD-10-CM | POA: Diagnosis not present

## 2023-12-06 DIAGNOSIS — M0579 Rheumatoid arthritis with rheumatoid factor of multiple sites without organ or systems involvement: Secondary | ICD-10-CM | POA: Diagnosis not present

## 2023-12-07 ENCOUNTER — Encounter: Payer: Self-pay | Admitting: Obstetrics

## 2023-12-07 MED ORDER — FLUCONAZOLE 150 MG PO TABS: 150.0000 mg | ORAL_TABLET | Freq: Once | ORAL | 0 refills | Status: AC

## 2023-12-10 ENCOUNTER — Encounter: Payer: Self-pay | Admitting: Obstetrics

## 2023-12-11 NOTE — Telephone Encounter (Signed)
 Attempted to contact patient. LVMTRC

## 2023-12-13 ENCOUNTER — Ambulatory Visit
Admission: RE | Admit: 2023-12-13 | Discharge: 2023-12-13 | Disposition: A | Discharge status: Home / Self Care | Payer: Medicare Other | Source: Ambulatory Visit | Attending: Family Medicine | Admitting: Family Medicine

## 2023-12-13 DIAGNOSIS — Z1231 Encounter for screening mammogram for malignant neoplasm of breast: Secondary | ICD-10-CM

## 2023-12-14 NOTE — Therapy (Signed)
 [facility] [facility] [address] [place], [place], [address] Phone: [phone]   Fax:  [phone]  Patient Details  Name: Jennifer Hull MRN: [id number] Date of Birth: 24-Sep-1941 Referring Provider:  No ref. provider found  Encounter Date: 12/14/2023 PHYSICAL THERAPY DISCHARGE SUMMARY  Visits from Start of Care: 5  Current functional level related to goals / functional outcomes: Met 1/1 STG   Remaining deficits: unknown   Education / Equipment: HEP initiated   Patient agrees to discharge. Patient goals were partially met. Patient is being discharged due to not returning since the last visit.   Dion Body, PT 12/14/2023, 9:59 AM  [place] [facility] [address] [place], [place], [address] Phone: [phone]   Fax:  [phone]

## 2023-12-17 ENCOUNTER — Ambulatory Visit (INDEPENDENT_AMBULATORY_CARE_PROVIDER_SITE_OTHER)

## 2023-12-17 ENCOUNTER — Other Ambulatory Visit (HOSPITAL_COMMUNITY)
Admission: RE | Admit: 2023-12-17 | Discharge: 2023-12-17 | Disposition: A | Discharge status: Home / Self Care | Source: Ambulatory Visit | Attending: Obstetrics and Gynecology | Admitting: Obstetrics and Gynecology

## 2023-12-17 VITALS — BP 99/56 | HR 72

## 2023-12-17 DIAGNOSIS — N898 Other specified noninflammatory disorders of vagina: Secondary | ICD-10-CM | POA: Insufficient documentation

## 2023-12-17 NOTE — Progress Notes (Unsigned)
 Jennifer Hull is a 83 y.o. female came in for a vaginal swab. Vaginal swab obtained and send to the lab. Pt will be contacted when the results are back.

## 2023-12-17 NOTE — Patient Instructions (Signed)
Keep all future follow up appointments.

## 2023-12-18 ENCOUNTER — Encounter: Payer: Self-pay | Admitting: Obstetrics

## 2023-12-18 LAB — CERVICOVAGINAL ANCILLARY ONLY
Bacterial Vaginitis (gardnerella): NEGATIVE
Candida Glabrata: NEGATIVE
Candida Vaginitis: NEGATIVE
Comment: NEGATIVE
Comment: NEGATIVE
Comment: NEGATIVE

## 2023-12-19 ENCOUNTER — Other Ambulatory Visit: Payer: Self-pay | Admitting: Family Medicine

## 2023-12-19 DIAGNOSIS — M316 Other giant cell arteritis: Secondary | ICD-10-CM | POA: Diagnosis not present

## 2023-12-19 DIAGNOSIS — M8589 Other specified disorders of bone density and structure, multiple sites: Secondary | ICD-10-CM | POA: Diagnosis not present

## 2023-12-19 DIAGNOSIS — E039 Hypothyroidism, unspecified: Secondary | ICD-10-CM

## 2023-12-19 DIAGNOSIS — M0589 Other rheumatoid arthritis with rheumatoid factor of multiple sites: Secondary | ICD-10-CM | POA: Diagnosis not present

## 2023-12-19 DIAGNOSIS — G5603 Carpal tunnel syndrome, bilateral upper limbs: Secondary | ICD-10-CM | POA: Diagnosis not present

## 2023-12-19 DIAGNOSIS — Z6822 Body mass index (BMI) 22.0-22.9, adult: Secondary | ICD-10-CM | POA: Diagnosis not present

## 2023-12-19 DIAGNOSIS — M1991 Primary osteoarthritis, unspecified site: Secondary | ICD-10-CM | POA: Diagnosis not present

## 2023-12-19 DIAGNOSIS — R7989 Other specified abnormal findings of blood chemistry: Secondary | ICD-10-CM | POA: Diagnosis not present

## 2023-12-19 DIAGNOSIS — Z79899 Other long term (current) drug therapy: Secondary | ICD-10-CM | POA: Diagnosis not present

## 2023-12-19 DIAGNOSIS — M5136 Other intervertebral disc degeneration, lumbar region with discogenic back pain only: Secondary | ICD-10-CM | POA: Diagnosis not present

## 2023-12-19 DIAGNOSIS — R5383 Other fatigue: Secondary | ICD-10-CM | POA: Diagnosis not present

## 2023-12-22 ENCOUNTER — Other Ambulatory Visit: Payer: Self-pay | Admitting: Family Medicine

## 2023-12-22 DIAGNOSIS — E785 Hyperlipidemia, unspecified: Secondary | ICD-10-CM

## 2023-12-25 ENCOUNTER — Telehealth: Payer: Self-pay

## 2023-12-25 NOTE — Telephone Encounter (Signed)
-----   Message from [facility] R sent at 11/06/2023 10:23 AM EST ----- Regarding: [id number] 4 month with JL GERD, chronic constipation,

## 2023-12-25 NOTE — Telephone Encounter (Signed)
 Left message with spanish interpreter (ID # [phone]) to have the patient to return call to schedule 4 month follow up appointment with Hyacinth Meeker.  Will continue efforts.

## 2024-01-01 NOTE — Telephone Encounter (Signed)
 Sophia scheduled patient to follow up in the office with Victorino Dike on 03-11-24 at 2:30pm.  Patient agreed to plan and verbalized understanding.

## 2024-01-03 DIAGNOSIS — M0579 Rheumatoid arthritis with rheumatoid factor of multiple sites without organ or systems involvement: Secondary | ICD-10-CM | POA: Diagnosis not present

## 2024-01-08 ENCOUNTER — Encounter: Payer: Self-pay | Admitting: Obstetrics

## 2024-01-08 ENCOUNTER — Ambulatory Visit (INDEPENDENT_AMBULATORY_CARE_PROVIDER_SITE_OTHER): Payer: Medicare Other | Admitting: Obstetrics

## 2024-01-08 ENCOUNTER — Other Ambulatory Visit (HOSPITAL_COMMUNITY)
Admission: RE | Admit: 2024-01-08 | Discharge: 2024-01-08 | Disposition: A | Discharge status: Home / Self Care | Source: Other Acute Inpatient Hospital | Attending: Obstetrics | Admitting: Obstetrics

## 2024-01-08 VITALS — BP 108/64 | HR 59

## 2024-01-08 DIAGNOSIS — N368 Other specified disorders of urethra: Secondary | ICD-10-CM

## 2024-01-08 DIAGNOSIS — R8290 Unspecified abnormal findings in urine: Secondary | ICD-10-CM | POA: Diagnosis not present

## 2024-01-08 DIAGNOSIS — M6289 Other specified disorders of muscle: Secondary | ICD-10-CM | POA: Diagnosis not present

## 2024-01-08 DIAGNOSIS — K5900 Constipation, unspecified: Secondary | ICD-10-CM | POA: Diagnosis not present

## 2024-01-08 DIAGNOSIS — R82998 Other abnormal findings in urine: Secondary | ICD-10-CM | POA: Diagnosis present

## 2024-01-08 LAB — URINALYSIS, ROUTINE W REFLEX MICROSCOPIC
Bilirubin Urine: NEGATIVE
Glucose, UA: NEGATIVE mg/dL
Ketones, ur: NEGATIVE mg/dL
Nitrite: NEGATIVE
Protein, ur: NEGATIVE mg/dL
Specific Gravity, Urine: 1.009 (ref 1.005–1.030)
pH: 7 (ref 5.0–8.0)

## 2024-01-08 NOTE — Assessment & Plan Note (Addendum)
-   POCT UA + leuk/heme - UA 10/19/23 UA + heme, culture with mixed growth with prior negative UA.  - pending UA microscopy and culture - denies LUTS or UTI symptoms - continue vaginal estrogen

## 2024-01-08 NOTE — Assessment & Plan Note (Signed)
-   pressure and discomfort with palpation of left sided pelvic floor muscles and puborectalis likely due to chronic constipation and straining - encouraged squatting position for BM and continue miralax to optimize stool consistency - encouraged to consider pelvic floor PT due to persistent pain with palpation of pelvic floor muscle, pt declines at this time due to improved symptoms

## 2024-01-08 NOTE — Assessment & Plan Note (Addendum)
-   previously reviewed anatomy and provided handout for urethral prolapse - UA negative and prior bladder scan 89mL  - discussed management options including expectant management, medical management, and surgical intervention.  - intermittent irritation resolved with loading dose of vaginal estrogen, reports some return of symptoms 2x/week. Increase to 1g 3x/week and encouraged to continue application with finger due to myofascial pain. Declines additional intervention at this time due to resolution of symptoms - discussed symptoms that warrant immediate evaluation and intervention if she develops pain, bleeding, or inability to void. Pt desires to avoid surgical intervention.- reviewed anatomy and provided handout for urethral prolapse

## 2024-01-08 NOTE — Assessment & Plan Note (Signed)
-   chronic constipation since childhood - improved with daily BM and avoid straining - MiraLAX BID, mag oxide, Linzess to 90 mcg, Dulcolax suppository and pills PRN started 3 years ago - For constipation, we previuosly reviewed the importance of a better bowel regimen.  We also discussed the importance of avoiding chronic straining, as it can exacerbate her pelvic floor symptoms; we discussed treating constipation and straining prior to surgery, as postoperative straining can lead to damage to the repair and recurrence of symptoms. We discussed initiating therapy with increasing fluid intake, fiber supplementation, stool softeners, and laxatives such as miralax.   - encouraged squatty potty use and pelvic relaxation

## 2024-01-08 NOTE — Progress Notes (Signed)
 [facility] Return Visit  SUBJECTIVE  History of Present Illness: Jennifer Hull is a 83 y.o. female seen in follow-up for  urethral prolapse, IBS-C, mixed urinary incontinence, high tone pelvic floor. Plan at last visit was continue vaginal estrogen.   Declines spanish interpreter  Denies return of burning or bleeding, sensation of incomplete emptying, or pain.  Continues vaginal estrogen use 1g 3x/week.  Declines pelvic floor PT or additional treatments at this time due to resolution of symptoms.  Reports using miralax daily with linzess and intermittent dulcolax with improvement of constipation and daily bowel movements.    Past Medical History: Patient  has a past medical history of Anxiety, Arthritis, Chronic constipation, Depression, Hyperlipidemia, Hypothyroidism, Pneumonia, Rheumatoid arthritis (HCC) (2019), Temporal arteritis (HCC), and Thyroid disease.   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   Medications: She has a current medication list which includes the following prescription(s): acetaminophen, atorvastatin, bisacodyl, calcium carb-cholecalciferol, cetirizine, cholecalciferol, estradiol, folic acid, levothyroxine, linaclotide, melatonin, meloxicam, pantoprazole, polyethylene glycol, pyridoxine hcl, sertraline, sucralfate, temazepam, tocilizumab, turmeric, and zinc gluconate.   Allergies: Patient is allergic to tdap [tetanus-diphth-acell pertussis]; tetanus toxoid, adsorbed; hydroxychloroquine sulfate; and covid-19 (mrna) vaccine (pfizer) [covid-19 (mrna) vaccine].   Social History: Patient   reports that she quit smoking about 47 years ago. Her smoking use included cigarettes. She started smoking about 55 years ago. She has a 12 pack-year smoking history. She has never used smokeless tobacco. She reports that she does not drink alcohol and does not use drugs.     OBJECTIVE     Physical Exam: Vitals:   01/08/24 1056  BP: 108/64  Pulse: (!) 59   Gen: No apparent distress, A&O x 3.  Declines pelvic exam Lab Results  Component Value Date   COLORU yellow 10/19/2023   CLARITYU clear 10/19/2023   GLUCOSEUR Negative 10/19/2023   BILIRUBINUR negative 10/19/2023   KETONESU negative 10/19/2023   SPECGRAV 1.015 10/19/2023   RBCUR Trace intact 10/19/2023   PHUR 6.0 10/19/2023   PROTEINUR Negative 10/19/2023   UROBILINOGEN 0.2 10/19/2023   LEUKOCYTESUR Small (1+) (A) 10/19/2023         No data to display             ASSESSMENT AND PLAN    Jennifer Hull is a 83 y.o. with:  1. Abnormal urinalysis   2. Urethral prolapse   3. Constipation, unspecified constipation type   4. High-tone pelvic floor dysfunction     Abnormal urinalysis Assessment & Plan: - POCT UA + leuk/heme - UA 10/19/23 UA + heme, culture with mixed growth with prior negative UA.  - pending UA microscopy and culture - denies LUTS or UTI symptoms - continue vaginal estrogen   Urethral prolapse Assessment & Plan: - previously reviewed anatomy and provided handout for urethral prolapse - UA negative and prior bladder scan 89mL  - discussed management options including expectant management, medical management, and surgical intervention.  - intermittent irritation resolved with loading dose of vaginal estrogen, reports some return of symptoms 2x/week. Increase to 1g 3x/week and encouraged to continue application with finger due to myofascial pain. Declines additional intervention at this time due to resolution of symptoms - discussed symptoms that warrant immediate evaluation and intervention if  she  develops pain, bleeding, or inability to void. Pt desires to avoid surgical intervention.- reviewed anatomy and provided handout for urethral prolapse  Orders: -     Urine Culture; Future -     Urine Microscopic; Future -     POCT urinalysis dipstick  Constipation, unspecified constipation type Assessment & Plan: - chronic constipation since childhood - improved with daily BM and avoid straining - MiraLAX BID, mag oxide, Linzess to 90 mcg, Dulcolax suppository and pills PRN started 3 years ago - For constipation, we previuosly reviewed the importance of a better bowel regimen.  We also discussed the importance of avoiding chronic straining, as it can exacerbate her pelvic floor symptoms; we discussed treating constipation and straining prior to surgery, as postoperative straining can lead to damage to the repair and recurrence of symptoms. We discussed initiating therapy with increasing fluid intake, fiber supplementation, stool softeners, and laxatives such as miralax.   - encouraged squatty potty use and pelvic relaxation   High-tone pelvic floor dysfunction Assessment & Plan: - pressure and discomfort with palpation of left sided pelvic floor muscles and puborectalis likely due to chronic constipation and straining - encouraged squatting position for BM and continue miralax to optimize stool consistency - encouraged to consider pelvic floor PT due to persistent pain with palpation of pelvic floor muscle, pt declines at this time due to improved symptoms    Loleta Chance, MD

## 2024-01-09 ENCOUNTER — Encounter: Payer: Self-pay | Admitting: Obstetrics

## 2024-01-09 LAB — URINE CULTURE: Culture: <10000 — AB

## 2024-01-11 ENCOUNTER — Telehealth: Payer: Self-pay | Admitting: Family Medicine

## 2024-01-11 ENCOUNTER — Encounter: Payer: Self-pay | Admitting: Obstetrics

## 2024-01-11 NOTE — Telephone Encounter (Signed)
 Pt ready for scheduling after 01/22/24  Estimated out-of-pocket cost due at time of visit: $0  Primary Insurance: Medicare  Deductible: $257 out of $257  This summary of benefits is an estimation of the patient's out-of-pocket cost. Exact cost may vary based on individual plan coverage.

## 2024-01-16 ENCOUNTER — Ambulatory Visit

## 2024-01-16 ENCOUNTER — Encounter: Payer: Self-pay | Admitting: Family Medicine

## 2024-01-16 DIAGNOSIS — L609 Nail disorder, unspecified: Secondary | ICD-10-CM

## 2024-01-23 ENCOUNTER — Ambulatory Visit

## 2024-01-23 DIAGNOSIS — M810 Age-related osteoporosis without current pathological fracture: Secondary | ICD-10-CM

## 2024-01-23 MED ORDER — DENOSUMAB 60 MG/ML ~~LOC~~ SOSY: 60.0000 mg | PREFILLED_SYRINGE | Freq: Once | SUBCUTANEOUS | Status: AC

## 2024-01-23 NOTE — Progress Notes (Signed)
Per orders of Dr. Jordan, injection of Prolia given by Taydon Nasworthy R Trevis Eden. Patient tolerated injection well.  

## 2024-01-23 NOTE — Patient Instructions (Signed)
 Health Maintenance Due  Topic Date Due   Zoster Vaccines- Shingrix (1 of 2) Never done   COVID-19 Vaccine (3 - Pfizer risk series) 02/06/2020   Medicare Annual Wellness (AWV)  04/05/2023       08/21/2023    1:43 PM 02/23/2023    3:06 PM 08/25/2022   12:05 PM  Depression screen PHQ 2/9  Decreased Interest 0 0 0  Down, Depressed, Hopeless 0 0 0  PHQ - 2 Score 0 0 0  Altered sleeping 1 1 0  Tired, decreased energy 1 2 0  Change in appetite 0 0 0  Feeling bad or failure about yourself  0 0 0  Trouble concentrating 2 1 0  Moving slowly or fidgety/restless 1 0 0  Suicidal thoughts 0 0 0  PHQ-9 Score 5 4 0  Difficult doing work/chores Somewhat difficult Somewhat difficult

## 2024-01-24 ENCOUNTER — Ambulatory Visit (INDEPENDENT_AMBULATORY_CARE_PROVIDER_SITE_OTHER): Admitting: Podiatry

## 2024-01-24 DIAGNOSIS — B351 Tinea unguium: Secondary | ICD-10-CM

## 2024-01-24 DIAGNOSIS — Q666 Other congenital valgus deformities of feet: Secondary | ICD-10-CM

## 2024-01-24 MED ORDER — CICLOPIROX 8 % EX SOLN: Freq: Every day | CUTANEOUS | 0 refills | Status: DC

## 2024-01-24 NOTE — Progress Notes (Signed)
 Subjective:  Patient ID: Jennifer Hull, female    DOB: [birth year],  MRN: [id number]  Chief Complaint  Patient presents with   Nail Problem    Pt stated that her left hallux nail is loose she thinks that it is from a fungus     83 y.o. female presents with the above complaint.  Patient presents with concern left hallux thickened onychodystrophy mycotic nail x 1.  She wanted discuss treatment options for she has not seen anyone as prior to seeing me she has secondary complaint of arch and heel pain.  She wanted discuss treatment options for insole she has not seen anyone else prior to seeing me for this.  She would like to discuss treatment options for insole as well.   Review of Systems: Negative except as noted in the HPI. Denies N/V/F/Ch.  Past Medical History:  Diagnosis Date   Anxiety    Arthritis    Chronic constipation    Depression    Hyperlipidemia    Hypothyroidism    Pneumonia    Rheumatoid arthritis (HCC) 2019   Temporal arteritis (HCC)    Thyroid  disease     Current Outpatient Medications:    ciclopirox  (PENLAC ) 8 % solution, Apply topically at bedtime. Apply over nail and surrounding skin. Apply daily over previous coat. After seven (7) days, may remove with alcohol and continue cycle., Disp: 6.6 mL, Rfl: 0   fluconazole  (DIFLUCAN ) 150 MG tablet, Take 150 mg by mouth once., Disp: , Rfl:    acetaminophen  (TYLENOL ) 500 MG tablet, Take 1,000 mg by mouth every 8 (eight) hours as needed for moderate pain., Disp: , Rfl:    atorvastatin  (LIPITOR) 10 MG tablet, Take 1 tablet (10 mg total) by mouth daily., Disp: 90 tablet, Rfl: 2   bisacodyl  (DULCOLAX) 10 MG suppository, Every 2 days as needed., Disp: 30 suppository, Rfl: 1   Calcium  Carb-Cholecalciferol (CALCIUM  600+D3 PO), Take 1 tablet by mouth daily with lunch., Disp: , Rfl:    cetirizine  (ZYRTEC ) 10 MG chewable tablet, Chew 10 mg by mouth daily., Disp: , Rfl:    cholecalciferol (VITAMIN D3) 25 MCG (1000 UNIT) tablet,  Take 4,000 Units by mouth daily., Disp: , Rfl:    estradiol  (ESTRACE ) 0.1 MG/GM vaginal cream, Place 0.5 g vaginally 2 (two) times a week. Place 0.5g nightly for two weeks then twice a week after, Disp: 30 g, Rfl: 2   folic acid  (FOLVITE ) 800 MCG tablet, Take 800 mcg by mouth daily., Disp: , Rfl:    levothyroxine  (SYNTHROID ) 112 MCG tablet, TAKE 1 TABLET(112 MCG) BY MOUTH DAILY BEFORE BREAKFAST, Disp: 90 tablet, Rfl: 2   linaclotide  (LINZESS ) 290 MCG CAPS capsule, Take 1 capsule (290 mcg total) by mouth daily before breakfast., Disp: 30 capsule, Rfl: 6   Melatonin 10 MG CAPS, Take 10 mg by mouth at bedtime., Disp: , Rfl:    meloxicam  (MOBIC ) 7.5 MG tablet, Take 7.5 mg by mouth daily., Disp: , Rfl:    pantoprazole  (PROTONIX ) 40 MG tablet, Take 1 tablet (40 mg total) by mouth 2 (two) times daily before a meal., Disp: 60 tablet, Rfl: 3   polyethylene glycol (MIRALAX  / GLYCOLAX ) 17 g packet, Take 17 g by mouth daily., Disp: , Rfl:    Pyridoxine HCl (VITAMIN B-6 PO), Take by mouth., Disp: , Rfl:    sertraline  (ZOLOFT ) 100 MG tablet, TAKE 1 AND 1/2 TABLETS BY MOUTH EVERY DAY (Patient taking differently: Take 150 mg by mouth daily. Pt taking 150 mg),  Disp: 135 tablet, Rfl: 2   sucralfate  (CARAFATE ) 1 GM/10ML suspension, Take 1 g by mouth 2 (two) times daily. As needed., Disp: , Rfl:    temazepam  (RESTORIL ) 15 MG capsule, TAKE 1 CAPSULE BY MOUTH AT BEDTIME AS NEEDED FOR SLEEP, Disp: 30 capsule, Rfl: 1   Tocilizumab  (ACTEMRA  IV), Inject into the vein every 30 (thirty) days., Disp: , Rfl:    TURMERIC CURCUMIN PO, Take 3,000 mg by mouth daily., Disp: , Rfl:    zinc gluconate 50 MG tablet, Take 50 mg by mouth daily., Disp: , Rfl:   Current Facility-Administered Medications:    [START ON 02/06/2024] denosumab  (PROLIA ) injection 60 mg, 60 mg, Subcutaneous, Once, [place], Theotis Flake, MD  Social History   Tobacco Use  Smoking Status Former   Current packs/day: 0.00   Average packs/day: 1.5 packs/day for 8.0  years (12.0 ttl pk-yrs)   Types: Cigarettes   Start date: 21   Quit date: 4   Years since quitting: 47.3  Smokeless Tobacco Never  Tobacco Comments   smoked infrequently when she was young; quit 83 yo     Allergies  Allergen Reactions   Tdap [Tetanus-Diphth-Acell Pertussis] Other (See Comments)    SEVERE ALLERGIC REACTIONS (NECK STIFFNESS/HIGH FEVER/ETC)   Tetanus Toxoid, Adsorbed     Neck stiffness, and high fever Other reaction(s): Other (See Comments) Neck stiffness, and high fever   Hydroxychloroquine Sulfate Rash   Covid-19 (Mrna) Vaccine Proofreader) [Covid-19 (Mrna) Vaccine] Rash   Objective:  There were no vitals filed for this visit. There is no height or weight on file to calculate BMI. Constitutional Well developed. Well nourished.  Vascular Dorsalis pedis pulses palpable bilaterally. Posterior tibial pulses palpable bilaterally. Capillary refill normal to all digits.  No cyanosis or clubbing noted. Pedal hair growth normal.  Neurologic Normal speech. Oriented to person, place, and time. Epicritic sensation to light touch grossly present bilaterally.  Dermatologic Nails well groomed and normal in appearance. No open wounds. No skin lesions.  Orthopedic: Pes planovalgus foot structure noted with calcaneovalgus to many toe signs partially improved with regard to dorsiflexion of the hallux unable to perform single double heel raise.   Radiographs: None Assessment:   1. Nail fungus   2. Onychomycosis due to dermatophyte   3. Pes planovalgus    Plan:  Patient was evaluated and treated and all questions answered.  Left hallux nail fungus -Educated the patient on the etiology of onychomycosis and various treatment options associated with improving the fungal load.  I explained to the patient that there is 3 treatment options available to treat the onychomycosis including topical, p.o., laser treatment.  Patient elected to go topical medication.  Penlac  was  dispensed have asked her to apply twice a day she states understanding will do so immediately  Pes planovalgus -I explained to patient the etiology of pes planovalgus and relationship with Planter fasciitis and various treatment options were discussed.  Given patient foot structure in the setting of Planter fasciitis I believe patient will benefit from custom-made orthotics to help control the hindfoot motion support the arch of the foot and take the stress away from plantar fascial.  Patient agrees with the plan like to proceed with orthotics -Patient was casted for orthotics

## 2024-01-31 DIAGNOSIS — M0579 Rheumatoid arthritis with rheumatoid factor of multiple sites without organ or systems involvement: Secondary | ICD-10-CM | POA: Diagnosis not present

## 2024-02-05 MED ORDER — DENOSUMAB 60 MG/ML ~~LOC~~ SOSY
60.0000 mg | PREFILLED_SYRINGE | Freq: Once | SUBCUTANEOUS | Status: AC
  Administered 2024-01-23: 60 mg via SUBCUTANEOUS

## 2024-02-05 NOTE — Addendum Note (Signed)
 Addended by: Nicolina Barrios B on: 02/05/2024 09:16 AM   Modules accepted: Orders

## 2024-02-17 ENCOUNTER — Other Ambulatory Visit: Payer: Self-pay | Admitting: Gastroenterology

## 2024-02-18 ENCOUNTER — Ambulatory Visit

## 2024-02-18 ENCOUNTER — Other Ambulatory Visit: Payer: Self-pay | Admitting: Family Medicine

## 2024-02-18 DIAGNOSIS — F411 Generalized anxiety disorder: Secondary | ICD-10-CM

## 2024-02-18 DIAGNOSIS — F331 Major depressive disorder, recurrent, moderate: Secondary | ICD-10-CM

## 2024-02-18 NOTE — Progress Notes (Signed)
 Orthotics   Patient was present and evaluated for Custom molded foot orthotics. Patient will benefit from CFO's to provide total contact to BIL MLA's helping to balance and distribute body weight more evenly across BIL feet helping to reduce plantar pressure and pain. Orthotic will also encourage FF / RF alignment  Patient was scanned today and will return for fitting upon receipt  ABN signed financial ppw signed as well patient to be called when in  Oop $542 / $150 down to order  Britton Cane Cped, CFo,CFm

## 2024-02-20 DIAGNOSIS — M19071 Primary osteoarthritis, right ankle and foot: Secondary | ICD-10-CM | POA: Diagnosis not present

## 2024-02-20 DIAGNOSIS — M19072 Primary osteoarthritis, left ankle and foot: Secondary | ICD-10-CM | POA: Diagnosis not present

## 2024-02-26 ENCOUNTER — Ambulatory Visit (INDEPENDENT_AMBULATORY_CARE_PROVIDER_SITE_OTHER): Payer: Medicare Other | Admitting: Family Medicine

## 2024-02-26 VITALS — BP 126/80 | HR 76 | Resp 16 | Ht 59.0 in | Wt 127.5 lb

## 2024-02-26 DIAGNOSIS — E785 Hyperlipidemia, unspecified: Secondary | ICD-10-CM

## 2024-02-26 DIAGNOSIS — M069 Rheumatoid arthritis, unspecified: Secondary | ICD-10-CM | POA: Diagnosis not present

## 2024-02-26 DIAGNOSIS — F331 Major depressive disorder, recurrent, moderate: Secondary | ICD-10-CM

## 2024-02-26 DIAGNOSIS — E039 Hypothyroidism, unspecified: Secondary | ICD-10-CM

## 2024-02-26 DIAGNOSIS — G4700 Insomnia, unspecified: Secondary | ICD-10-CM

## 2024-02-26 DIAGNOSIS — M545 Low back pain, unspecified: Secondary | ICD-10-CM

## 2024-02-26 DIAGNOSIS — F411 Generalized anxiety disorder: Secondary | ICD-10-CM

## 2024-02-26 DIAGNOSIS — G8929 Other chronic pain: Secondary | ICD-10-CM

## 2024-02-26 DIAGNOSIS — E538 Deficiency of other specified B group vitamins: Secondary | ICD-10-CM | POA: Diagnosis not present

## 2024-02-26 MED ORDER — SERTRALINE HCL 50 MG PO TABS: 50.0000 mg | ORAL_TABLET | Freq: Every day | ORAL | 0 refills | Status: DC

## 2024-02-26 NOTE — Assessment & Plan Note (Signed)
 Problem has been well controlled. Last TSH 0.27 in 02/2023. Continue Euthyrox  112 mcg daily. TSH and T4 with next blood work.

## 2024-02-26 NOTE — Patient Instructions (Addendum)
 A few things to remember from today's visit:  Generalized anxiety disorder  Insomnia, unspecified type  Major depressive disorder, recurrent episode, moderate (HCC)  Hyperlipidemia, unspecified hyperlipidemia type - Plan: Lipid panel  Hypothyroidism, unspecified type - Plan: TSH, T4, free  Trate de disminuir la dosis de Sertraline  a 125 mg dia por medio y 150 mg alternando por 4 semanas.Despues 125 mg diario por 4 semanas y me dice como le fue con estos cambios para tratar de seguir. Labs en ayunas esta semana or la proxima. Llame a su ortopedista si el dolor de espalda empeora. Cuidado co caidas.  If you need refills for medications you take chronically, please call your pharmacy. Do not use My Chart to request refills or for acute issues that need immediate attention. If you send a my chart message, it may take a few days to be addressed, specially if I am not in the office.  Please be sure medication list is accurate. If a new problem present, please set up appointment sooner than planned today.

## 2024-02-26 NOTE — Assessment & Plan Note (Signed)
 Continue atorvastatin  10 mg daily and low-fat diet. She is not fasting today, she will arrange lab appointment for fasting labs.

## 2024-02-26 NOTE — Assessment & Plan Note (Addendum)
 Currently she is on tocilizumab  IV every 30 days. Following with rheumatologist q 6 months.

## 2024-02-26 NOTE — Assessment & Plan Note (Addendum)
 Problem has been stable. She agrees with trying to decrease sertraline  from 150 mg to 125 mg, start alternating between 150 mg and 125 mg every other day for 4 weeks then 125 mg daily for 4 weeks. She we will let me know about tolerance, so we can continue titrating down to 100 mg daily. Continue temazepam  15 mg daily as needed. Follow-up in 6 months, before if needed.

## 2024-02-26 NOTE — Progress Notes (Signed)
 HPI: Jennifer Hull is a 83 y.o. female  with a PMHx significant for aortic atherosclerosis, GERD, IBS, hypothyroidism, RA, peripheral neuropathy, HLD, anxiety, B12 deficiency, and vitamin D  deficiency, who is here today for chronic disease management.  Last seen on 08/21/23  Fall / hip pain Her most recent fall was earlier this year.  Jennifer Hull notes Jennifer Hull lost balance while arranging things in her closet, fell side ways on her left side.  No new pains or changes in function. Jennifer Hull endorses left hip pain and left lower back, radiating down her left leg. Hx of lumbar radiculopathy, Jennifer Hull mentions that Jennifer Hull has had injections in her back. Negative for LE numbness,tingling,saddle anesthesia,or changes in bowel/bladder function.  -Since her last visit Jennifer Hull has followed with podiatrist for onychomycosis, treatment has not helped and planning on having nail removed.  -RA: Pt follows up with rheumatology every 6 months.   Hypothyroidism: Currently on Synthroid  112 mcg.  Good compliance and tolerance.    Lab Results  Component Value Date   TSH 0.781 02/23/2023   Vit B12 deficiency: B12 1000 mcg taking at the recommendation of sports med.  Received B12 injection in 07/2023 with sports med and states her condition has overall improved.   Anxiety,depression,and insomnia: Reports sleeping about 6-7 hours on average.  Jennifer Hull takes Temazepam  15 mg  1/2-1 tab daily as needed and Sertraline  100 mg 1.5 tab daily. Jennifer Hull report that these problems are well controlled. Jennifer Hull has taken these medications for years.  Hyperlipidemia and aortic atherosclerosis: Currently on atorvastatin  10 mg daily.        Lab Results  Component Value Date    CHOL 147 01/19/2022    HDL 69.50 01/19/2022    LDLCALC 65 01/19/2022    LDLDIRECT 67.0 01/30/2020    TRIG 60.0 01/19/2022    CHOLHDL 2 01/19/2022      Review of Systems  Constitutional:  Negative for activity change, appetite change and fever.  HENT:  Negative for  sore throat.   Respiratory:  Negative for cough and shortness of breath.   Cardiovascular:  Negative for chest pain and leg swelling.  Gastrointestinal:  Negative for abdominal pain, nausea and vomiting.  Genitourinary:  Negative for decreased urine volume, dysuria and hematuria.  Musculoskeletal:  Positive for arthralgias, back pain and gait problem.  Skin:  Negative for rash.  Neurological:  Negative for syncope and facial asymmetry.  Psychiatric/Behavioral:  Negative for confusion and hallucinations.   See other pertinent positives and negatives in HPI.  Current Outpatient Medications on File Prior to Visit  Medication Sig Dispense Refill   acetaminophen  (TYLENOL ) 500 MG tablet Take 1,000 mg by mouth every 8 (eight) hours as needed for moderate pain.     atorvastatin  (LIPITOR) 10 MG tablet Take 1 tablet (10 mg total) by mouth daily. 90 tablet 2   bisacodyl  (DULCOLAX) 10 MG suppository Every 2 days as needed. 30 suppository 1   Calcium  Carb-Cholecalciferol (CALCIUM  600+D3 PO) Take 1 tablet by mouth daily with lunch.     cetirizine  (ZYRTEC ) 10 MG tablet Take 10 mg by mouth daily.     cholecalciferol (VITAMIN D3) 25 MCG (1000 UNIT) tablet Take 4,000 Units by mouth daily.     ciclopirox  (PENLAC ) 8 % solution Apply topically at bedtime. Apply over nail and surrounding skin. Apply daily over previous coat. After seven (7) days, may remove with alcohol and continue cycle. 6.6 mL 0   estradiol  (ESTRACE ) 0.1 MG/GM vaginal cream Place 0.5 g vaginally  2 (two) times a week. Place 0.5g nightly for two weeks then twice a week after 30 g 2   folic acid  (FOLVITE ) 800 MCG tablet Take 800 mcg by mouth daily.     levothyroxine  (SYNTHROID ) 112 MCG tablet TAKE 1 TABLET(112 MCG) BY MOUTH DAILY BEFORE BREAKFAST 90 tablet 2   linaclotide  (LINZESS ) 290 MCG CAPS capsule Take 1 capsule (290 mcg total) by mouth daily before breakfast. 30 capsule 6   Melatonin 10 MG CAPS Take 10 mg by mouth at bedtime.     meloxicam   (MOBIC ) 7.5 MG tablet Take 7.5 mg by mouth daily.     pantoprazole  (PROTONIX ) 40 MG tablet TAKE 1 TABLET(40 MG) BY MOUTH DAILY 90 tablet 0   polyethylene glycol (MIRALAX  / GLYCOLAX ) 17 g packet Take 17 g by mouth daily.     Pyridoxine HCl (VITAMIN B-6 PO) Take by mouth.     sertraline  (ZOLOFT ) 100 MG tablet TAKE 1 AND 1/2 TABLETS BY MOUTH EVERY DAY 135 tablet 2   sucralfate  (CARAFATE ) 1 GM/10ML suspension Take 1 g by mouth 2 (two) times daily. As needed.     temazepam  (RESTORIL ) 15 MG capsule TAKE 1 CAPSULE BY MOUTH AT BEDTIME AS NEEDED FOR SLEEP 30 capsule 1   Tocilizumab  (ACTEMRA  IV) Inject into the vein every 30 (thirty) days.     TURMERIC CURCUMIN PO Take 3,000 mg by mouth daily.     zinc gluconate 50 MG tablet Take 50 mg by mouth daily.     Current Facility-Administered Medications on File Prior to Visit  Medication Dose Route Frequency Provider Last Rate Last Admin   denosumab  (PROLIA ) injection 60 mg  60 mg Subcutaneous Once Hull, Kaeley Vinje G, MD        Past Medical History:  Diagnosis Date   Anxiety    Arthritis    Chronic constipation    Depression    Hyperlipidemia    Hypothyroidism    Pneumonia    Rheumatoid arthritis (HCC) 2019   Temporal arteritis (HCC)    Thyroid  disease    Allergies  Allergen Reactions   Tdap [Tetanus-Diphth-Acell Pertussis] Other (See Comments)    SEVERE ALLERGIC REACTIONS (NECK STIFFNESS/HIGH FEVER/ETC)   Tetanus Toxoid, Adsorbed     Neck stiffness, and high fever Other reaction(s): Other (See Comments) Neck stiffness, and high fever   Hydroxychloroquine Sulfate Rash   Covid-19 (Mrna) Vaccine Proofreader) [Covid-19 (Mrna) Vaccine] Rash    Social History   Socioeconomic History   Marital status: Married    Spouse name: Not on file   Number of children: 1   Years of education: Not on file   Highest education level: Professional school degree (e.g., MD, DDS, DVM, JD)  Occupational History   Occupation: Nurse  Tobacco Use   Smoking status:  Former    Current packs/day: 0.00    Average packs/day: 1.5 packs/day for 8.0 years (12.0 ttl pk-yrs)    Types: Cigarettes    Start date: 48    Quit date: 52    Years since quitting: 47.4   Smokeless tobacco: Never   Tobacco comments:    smoked infrequently when Jennifer Hull was young; quit 83 yo   Vaping Use   Vaping status: Never Used  Substance and Sexual Activity   Alcohol use: No   Drug use: No   Sexual activity: Not Currently    Partners: Male    Birth control/protection: Surgical  Other Topics Concern   Not on file  Social History Narrative  Not on file   Social Drivers of Health   Financial Resource Strain: Low Risk  (02/26/2024)   Overall Financial Resource Strain (CARDIA)    Difficulty of Paying Living Expenses: Not hard at all  Food Insecurity: No Food Insecurity (02/26/2024)   Hunger Vital Sign    Worried About Running Out of Food in the Last Year: Never true    Ran Out of Food in the Last Year: Never true  Transportation Needs: No Transportation Needs (02/26/2024)   PRAPARE - Administrator, Civil Service (Medical): No    Lack of Transportation (Non-Medical): No  Physical Activity: Unknown (02/26/2024)   Exercise Vital Sign    Days of Exercise per Week: 0 days    Minutes of Exercise per Session: Not on file  Stress: No Stress Concern Present (02/26/2024)   [employer] of Occupational Health - Occupational Stress Questionnaire    Feeling of Stress : Only a little  Social Connections: Moderately Integrated (02/26/2024)   Social Connection and Isolation Panel [NHANES]    Frequency of Communication with Friends and Family: More than three times a week    Frequency of Social Gatherings with Friends and Family: Once a week    Attends Religious Services: More than 4 times per year    Active Member of [employer] or Organizations: No    Attends Banker Meetings: Not on file    Marital Status: Married   Today's Vitals   02/26/24 1436  BP: 126/80   Pulse: 76  Resp: 16  SpO2: 98%  Weight: 127 lb 8 oz (57.8 kg)  Height: 4\' 11"  (1.499 m)   Body mass index is 25.75 kg/m.  Physical Exam Vitals and nursing note reviewed.  Constitutional:      General: Jennifer Hull is not in acute distress.    Appearance: Jennifer Hull is well-developed.  HENT:     Head: Normocephalic and atraumatic.  Eyes:     Conjunctiva/sclera: Conjunctivae normal.  Cardiovascular:     Rate and Rhythm: Normal rate and regular rhythm.     Pulses:          Dorsalis pedis pulses are 2+ on the right side and 2+ on the left side.     Heart sounds: No murmur heard. Pulmonary:     Effort: Pulmonary effort is normal. No respiratory distress.     Breath sounds: Normal breath sounds.  Abdominal:     Palpations: Abdomen is soft. There is no mass.     Tenderness: There is no abdominal tenderness.  Musculoskeletal:     Right lower leg: No edema.     Left lower leg: No edema.  Skin:    General: Skin is warm.     Findings: No erythema or rash.  Neurological:     General: No focal deficit present.     Mental Status: Jennifer Hull is alert and oriented to person, place, and time.     Comments: Antalgic gait assisted with a quad cane assistance.  Psychiatric:        Mood and Affect: Mood and affect normal.   ASSESSMENT AND PLAN: Jennifer Hull was seen today for chronic disease management.  Orders Placed This Encounter  Procedures   Lipid panel   TSH   T4, free   Vitamin B12   Lab Results  Component Value Date   VITAMINB12 615 02/27/2024   Lab Results  Component Value Date   TSH 0.57 02/27/2024   Lab Results  Component  Value Date   CHOL 159 02/27/2024   HDL 71.20 02/27/2024   LDLCALC 77 02/27/2024   LDLDIRECT 67.0 01/30/2020   TRIG 54.0 02/27/2024   CHOLHDL 2 02/27/2024   Chronic left-sided low back pain, unspecified whether sciatica present Today c/o left-sided lower back and hip pain sometimes radiated to LLE. I do not think imaging is needed today. Fall  precautions discussed. Recommend arranging appt with sport medicine, DR Felipe Horton, if problem is persistent or gets worse.  Insomnia, unspecified type Assessment & Plan: Problem is well controlled with current regimen. Continue Melatonin same dose and Temazepam  15 mg at bedtime prn. Continue good sleep hygiene. F/U in 6 months.  Hyperlipidemia, unspecified hyperlipidemia type Assessment & Plan: Continue atorvastatin  10 mg daily and low-fat diet. Jennifer Hull is not fasting today, Jennifer Hull will arrange lab appointment for fasting labs.  Orders: -     Lipid panel; Future  Hypothyroidism, unspecified type Assessment & Plan: Problem has been well controlled. Last TSH 0.27 in 02/2023. Continue Euthyrox  112 mcg daily. TSH and T4 with next blood work.  Orders: -     TSH; Future -     T4, free; Future  B12 deficiency Assessment & Plan: Continue B12 at 1000 mcg daily. Further recommendation will be given according to B12 result.  Orders: -     Vitamin B12; Future  Generalized anxiety disorder Assessment & Plan: Problem has been stable. Jennifer Hull agrees with trying to decrease sertraline  from 150 mg to 125 mg, start alternating between 150 mg and 125 mg every other day for 4 weeks then 125 mg daily for 4 weeks. Jennifer Hull we will let me know about tolerance, so we can continue titrating down to 100 mg daily. Continue temazepam  15 mg daily as needed. Follow-up in 6 months, before if needed.  Orders: -     Sertraline  HCl; Take 1 tablet (50 mg total) by mouth daily.  Dispense: 90 tablet; Refill: 0  Rheumatoid arthritis involving multiple sites, unspecified whether rheumatoid factor present [facility]) Assessment & Plan: Currently Jennifer Hull is on tocilizumab  IV every 30 days. Following with rheumatologist q 6 months.  Major depressive disorder, recurrent episode, moderate (HCC) Assessment & Plan: Problem is adequately controlled. Will try to decrease sertraline  from 150 mg to 125, starting with alternating between 150  and 125 every other day for 4 weeks then 125 mg daily.  If Jennifer Hull tolerates well, we can continue titrating down to 100 mg daily. Follow-up in 6 months.  Orders: -     Sertraline  HCl; Take 1 tablet (50 mg total) by mouth daily.  Dispense: 90 tablet; Refill: 0  I spent a total of 39 minutes in both face to face and non face to face activities for this visit on the date of this encounter. During this time history was obtained and documented, examination was performed, prior labs reviewed, and assessment/plan discussed.  Return in about 6 months (around 08/28/2024) for chronic problems.  I, Bernita Bristle, acting as a scribe for Jarick Harkins [place], MD., have documented all relevant documentation on the behalf of Jennifer Salmi [place], MD, as directed by   while in the presence of Lanson Randle [place], MD.  I, Camren Henthorn [place], MD, have reviewed all documentation for this visit. The documentation on 02/26/24 for the exam, diagnosis, procedures, and orders are all accurate and complete.  Jennifer Hull G. [place], MD  [facility]. [place] office.

## 2024-02-26 NOTE — Assessment & Plan Note (Signed)
 Problem is adequately controlled. Will try to decrease sertraline  from 150 mg to 125, starting with alternating between 150 and 125 every other day for 4 weeks then 125 mg daily.  If she tolerates well, we can continue titrating down to 100 mg daily. Follow-up in 6 months.

## 2024-02-26 NOTE — Assessment & Plan Note (Signed)
 Continue B12 at 1000 mcg daily. Further recommendation will be given according to B12 result.

## 2024-02-26 NOTE — Assessment & Plan Note (Signed)
 Problem is well controlled with current regimen. Continue Melatonin same dose and Temazepam  15 mg at bedtime prn. Continue good sleep hygiene. F/U in 6 months.

## 2024-02-27 ENCOUNTER — Other Ambulatory Visit (INDEPENDENT_AMBULATORY_CARE_PROVIDER_SITE_OTHER)

## 2024-02-27 ENCOUNTER — Ambulatory Visit: Payer: Self-pay | Admitting: Family Medicine

## 2024-02-27 DIAGNOSIS — E785 Hyperlipidemia, unspecified: Secondary | ICD-10-CM

## 2024-02-27 DIAGNOSIS — E039 Hypothyroidism, unspecified: Secondary | ICD-10-CM

## 2024-02-27 DIAGNOSIS — E538 Deficiency of other specified B group vitamins: Secondary | ICD-10-CM | POA: Diagnosis not present

## 2024-02-27 LAB — LIPID PANEL
Cholesterol: 159 mg/dL (ref 0–200)
HDL: 71.2 mg/dL (ref >39.00)
LDL Cholesterol: 77 mg/dL (ref 0–99)
NonHDL: 87.42
Total CHOL/HDL Ratio: 2
Triglycerides: 54 mg/dL (ref 0.0–149.0)
VLDL: 10.8 mg/dL (ref 0.0–40.0)

## 2024-02-27 LAB — TSH: TSH: 0.57 u[IU]/mL (ref 0.35–5.50)

## 2024-02-27 LAB — T4, FREE: Free T4: 0.88 ng/dL (ref 0.60–1.60)

## 2024-02-27 LAB — VITAMIN B12: Vitamin B-12: 615 pg/mL (ref 211–911)

## 2024-02-28 ENCOUNTER — Ambulatory Visit (INDEPENDENT_AMBULATORY_CARE_PROVIDER_SITE_OTHER): Admitting: Family Medicine

## 2024-02-28 ENCOUNTER — Ambulatory Visit (INDEPENDENT_AMBULATORY_CARE_PROVIDER_SITE_OTHER)

## 2024-02-28 ENCOUNTER — Encounter: Payer: Self-pay | Admitting: Family Medicine

## 2024-02-28 VITALS — BP 120/64 | Ht 59.0 in

## 2024-02-28 DIAGNOSIS — Z79899 Other long term (current) drug therapy: Secondary | ICD-10-CM | POA: Diagnosis not present

## 2024-02-28 DIAGNOSIS — M542 Cervicalgia: Secondary | ICD-10-CM | POA: Diagnosis not present

## 2024-02-28 DIAGNOSIS — M50221 Other cervical disc displacement at C4-C5 level: Secondary | ICD-10-CM | POA: Diagnosis not present

## 2024-02-28 DIAGNOSIS — M549 Dorsalgia, unspecified: Secondary | ICD-10-CM | POA: Diagnosis not present

## 2024-02-28 DIAGNOSIS — M47812 Spondylosis without myelopathy or radiculopathy, cervical region: Secondary | ICD-10-CM | POA: Diagnosis not present

## 2024-02-28 DIAGNOSIS — M4312 Spondylolisthesis, cervical region: Secondary | ICD-10-CM | POA: Diagnosis not present

## 2024-02-28 DIAGNOSIS — M419 Scoliosis, unspecified: Secondary | ICD-10-CM | POA: Diagnosis not present

## 2024-02-28 DIAGNOSIS — M25511 Pain in right shoulder: Secondary | ICD-10-CM | POA: Insufficient documentation

## 2024-02-28 DIAGNOSIS — M47814 Spondylosis without myelopathy or radiculopathy, thoracic region: Secondary | ICD-10-CM | POA: Diagnosis not present

## 2024-02-28 DIAGNOSIS — R5383 Other fatigue: Secondary | ICD-10-CM | POA: Diagnosis not present

## 2024-02-28 DIAGNOSIS — M4802 Spinal stenosis, cervical region: Secondary | ICD-10-CM | POA: Diagnosis not present

## 2024-02-28 DIAGNOSIS — M0579 Rheumatoid arthritis with rheumatoid factor of multiple sites without organ or systems involvement: Secondary | ICD-10-CM | POA: Diagnosis not present

## 2024-02-28 NOTE — Progress Notes (Signed)
 [facility] [address] [place] [address] Phone: [phone] Subjective:   Jennifer Hull, am serving as a scribe for Dr. Ronnell Coins.  I'm seeing this patient by the request  of:  [place], Betty G, MD  CC: upper back pain   [id number]  Jennifer Hull is a 82 y.o. female coming in with complaint of upper back pain. R side pain on an off for many year. Pain is radiated into neck and skull. Tylenol  will help sometimes. Will take a meloxicam  if needed.    Past medical history is significant for rheumatoid arthritis followed by rheumatology.  Patient did have a chest x-ray in December 2024 when we review it does have fairly significant degenerative changes of the thoracic spine.  Last bone density was in 2023.  Past Medical History:  Diagnosis Date   Anxiety    Arthritis    Chronic constipation    Depression    Hyperlipidemia    Hypothyroidism    Pneumonia    Rheumatoid arthritis (HCC) 2019   Temporal arteritis (HCC)    Thyroid  disease    Past Surgical History:  Procedure Laterality Date   ABDOMINAL HYSTERECTOMY     BREAST EXCISIONAL BIOPSY Left    BREAST SURGERY     biopsy   CARPAL TUNNEL RELEASE Right    COLONOSCOPY     JOINT REPLACEMENT     REVERSE SHOULDER ARTHROPLASTY Left 06/09/2021   Procedure: REVERSE SHOULDER ARTHROPLASTY;  Surgeon: Sammye Cristal, MD;  Location: [facility];  Service: Orthopedics;  Laterality: Left;   SHOULDER SURGERY Left 06/09/2021   TEMPORAL ARTERY BIOPSY / LIGATION Bilateral 2014   TONSILLECTOMY     TOTAL SHOULDER ARTHROPLASTY Right 11/22/2017   Procedure: RIGHT REVERSE TOTAL SHOULDER ARTHROPLASTY;  Surgeon: Sammye Cristal, MD;  Location: [facility];  Service: Orthopedics;  Laterality: Right;   Social History   Socioeconomic History   Marital status: Married    Spouse name: Not on file   Number of children: 1   Years of education: Not on file   Highest education level: Professional school  degree (e.g., MD, DDS, DVM, JD)  Occupational History   Occupation: Nurse  Tobacco Use   Smoking status: Former    Current packs/day: 0.00    Average packs/day: 1.5 packs/day for 8.0 years (12.0 ttl pk-yrs)    Types: Cigarettes    Start date: 32    Quit date: 22    Years since quitting: 47.4   Smokeless tobacco: Never   Tobacco comments:    smoked infrequently when she was young; quit 83 yo   Vaping Use   Vaping status: Never Used  Substance and Sexual Activity   Alcohol use: No   Drug use: No   Sexual activity: Not Currently    Partners: Male    Birth control/protection: Surgical  Other Topics Concern   Not on file  Social History Narrative   Not on file   Social Drivers of Health   Financial Resource Strain: Low Risk  (02/26/2024)   Overall Financial Resource Strain (CARDIA)    Difficulty of Paying Living Expenses: Not hard at all  Food Insecurity: No Food Insecurity (02/26/2024)   Hunger Vital Sign    Worried About Running Out of Food in the Last Year: Never true    Ran Out of Food in the Last Year: Never true  Transportation Needs: No Transportation Needs (02/26/2024)   PRAPARE - Transportation    Lack of  Transportation (Medical): No    Lack of Transportation (Non-Medical): No  Physical Activity: Unknown (02/26/2024)   Exercise Vital Sign    Days of Exercise per Week: 0 days    Minutes of Exercise per Session: Not on file  Stress: No Stress Concern Present (02/26/2024)   [employer] of Occupational Health - Occupational Stress Questionnaire    Feeling of Stress : Only a little  Social Connections: Moderately Integrated (02/26/2024)   Social Connection and Isolation Panel [NHANES]    Frequency of Communication with Friends and Family: More than three times a week    Frequency of Social Gatherings with Friends and Family: Once a week    Attends Religious Services: More than 4 times per year    Active Member of [employer] or Organizations: No    Attends Museum/gallery exhibitions officer: Not on file    Marital Status: Married   Allergies  Allergen Reactions   Tdap [Tetanus-Diphth-Acell Pertussis] Other (See Comments)    SEVERE ALLERGIC REACTIONS (NECK STIFFNESS/HIGH FEVER/ETC)   Tetanus Toxoid, Adsorbed     Neck stiffness, and high fever Other reaction(s): Other (See Comments) Neck stiffness, and high fever   Hydroxychloroquine Sulfate Rash   Covid-19 (Mrna) Vaccine Proofreader) [Covid-19 (Mrna) Vaccine] Rash   Family History  Problem Relation Age of Onset   Kidney cancer Mother    Heart disease Father    Breast cancer Maternal Aunt    Prostate cancer Paternal Uncle    Colon cancer Neg Hx    Esophageal cancer Neg Hx    Rectal cancer Neg Hx    Stomach cancer Neg Hx    Inflammatory bowel disease Neg Hx    Liver disease Neg Hx    Pancreatic cancer Neg Hx    Bladder Cancer Neg Hx    Uterine cancer Neg Hx     Current Outpatient Medications (Endocrine & Metabolic):    levothyroxine  (SYNTHROID ) 112 MCG tablet, TAKE 1 TABLET(112 MCG) BY MOUTH DAILY BEFORE BREAKFAST  Current Facility-Administered Medications (Endocrine & Metabolic):    denosumab  (PROLIA ) injection 60 mg  Current Outpatient Medications (Cardiovascular):    atorvastatin  (LIPITOR) 10 MG tablet, Take 1 tablet (10 mg total) by mouth daily.   Current Outpatient Medications (Respiratory):    cetirizine  (ZYRTEC ) 10 MG tablet, Take 10 mg by mouth daily.   Current Outpatient Medications (Analgesics):    acetaminophen  (TYLENOL ) 500 MG tablet, Take 1,000 mg by mouth every 8 (eight) hours as needed for moderate pain.   meloxicam  (MOBIC ) 7.5 MG tablet, Take 7.5 mg by mouth daily.   Tocilizumab  (ACTEMRA  IV), Inject into the vein every 30 (thirty) days.   Current Outpatient Medications (Hematological):    folic acid  (FOLVITE ) 800 MCG tablet, Take 800 mcg by mouth daily.   Current Outpatient Medications (Other):    bisacodyl  (DULCOLAX) 10 MG suppository, Every 2 days as needed.    Calcium  Carb-Cholecalciferol (CALCIUM  600+D3 PO), Take 1 tablet by mouth daily with lunch.   cholecalciferol (VITAMIN D3) 25 MCG (1000 UNIT) tablet, Take 4,000 Units by mouth daily.   ciclopirox  (PENLAC ) 8 % solution, Apply topically at bedtime. Apply over nail and surrounding skin. Apply daily over previous coat. After seven (7) days, may remove with alcohol and continue cycle.   estradiol  (ESTRACE ) 0.1 MG/GM vaginal cream, Place 0.5 g vaginally 2 (two) times a week. Place 0.5g nightly for two weeks then twice a week after   linaclotide  (LINZESS ) 290 MCG CAPS capsule, Take 1 capsule (290 mcg total)  by mouth daily before breakfast.   Melatonin 10 MG CAPS, Take 10 mg by mouth at bedtime.   pantoprazole  (PROTONIX ) 40 MG tablet, TAKE 1 TABLET(40 MG) BY MOUTH DAILY   polyethylene glycol (MIRALAX  / GLYCOLAX ) 17 g packet, Take 17 g by mouth daily.   Pyridoxine HCl (VITAMIN B-6 PO), Take by mouth.   sertraline  (ZOLOFT ) 100 MG tablet, TAKE 1 AND 1/2 TABLETS BY MOUTH EVERY DAY   sertraline  (ZOLOFT ) 50 MG tablet, Take 1 tablet (50 mg total) by mouth daily.   sucralfate  (CARAFATE ) 1 GM/10ML suspension, Take 1 g by mouth 2 (two) times daily. As needed.   temazepam  (RESTORIL ) 15 MG capsule, TAKE 1 CAPSULE BY MOUTH AT BEDTIME AS NEEDED FOR SLEEP   TURMERIC CURCUMIN PO, Take 3,000 mg by mouth daily.   zinc gluconate 50 MG tablet, Take 50 mg by mouth daily.    Reviewed prior external information including notes and imaging from  primary care provider As well as notes that were available from care everywhere and other healthcare systems.  Past medical history, social, surgical and family history all reviewed in electronic medical record.  No pertanent information unless stated regarding to the chief complaint.   Review of Systems:  No headache, visual changes, nausea, vomiting, diarrhea, constipation, dizziness, abdominal pain, skin rash, fevers, chills, night sweats, weight loss, swollen lymph nodes, body  aches, joint swelling, chest pain, shortness of breath, mood changes. POSITIVE muscle aches  Objective  Blood pressure 120/64, height 4\' 11"  (1.499 m).   General: No apparent distress alert and oriented x3 mood and affect normal, dressed appropriately.  HEENT: Pupils equal, extraocular movements intact  Respiratory: Patient's speak in full sentences and does not appear short of breath  Cardiovascular: No lower extremity edema, non tender, no erythema  Severe kyphosis of the upper back noted.  Patient does have severe tenderness to palpation in the musculature in the trapezius, rhomboid, left upper scapula. Postsurgical changes of the right shoulder noted but doing well with knee movement actively.  After verbal consent patient was prepped with alcohol swab and with a 25-gauge half inch needle injected into 3 distinct trigger points in the right trapezius, rhomboid and levator scapula.  Minimal blood loss.  Total of 2 cc of 0.5% Marcaine  and 1 cc of Kenalog  40 mg/mL used.  Band-Aids placed.  Postinjection instructions given.    Impression and Recommendations:      The above documentation has been reviewed and is accurate and complete Bradan Congrove M Libero Puthoff, DO

## 2024-02-28 NOTE — Assessment & Plan Note (Signed)
 Injections given on the trigger points on the right side.  Patient does have a past medical history significant for severe scoliosis and on x-ray severe arthritic changes noted.  This is of the thoracic as well as the cervical neck that has significant arthritic changes.  I do not see any type of compression fracture at the moment.  Past medical history is significant for temporal arteritis but no headaches or no pain in the temporal area.  Given trigger point injections and tolerated very well.  Ergonomic changes including changing the size of her cane I think will be significantly helpful.  Discussed proper lifting mechanics.  Worsening pain to seek medical attention.  Follow-up again in 3 to 4 months

## 2024-02-28 NOTE — Patient Instructions (Addendum)
 Trigger point injections today Good to see you! Try new height on cane Be aware of keeping elbow in when lifting See you again in 3-4 months

## 2024-03-07 ENCOUNTER — Ambulatory Visit: Payer: Self-pay | Admitting: Family Medicine

## 2024-03-11 ENCOUNTER — Ambulatory Visit: Admitting: Physician Assistant

## 2024-03-16 ENCOUNTER — Other Ambulatory Visit: Payer: Self-pay | Admitting: Family Medicine

## 2024-03-16 DIAGNOSIS — G4700 Insomnia, unspecified: Secondary | ICD-10-CM

## 2024-03-27 DIAGNOSIS — M0579 Rheumatoid arthritis with rheumatoid factor of multiple sites without organ or systems involvement: Secondary | ICD-10-CM | POA: Diagnosis not present

## 2024-03-28 ENCOUNTER — Ambulatory Visit: Admitting: Physician Assistant

## 2024-03-28 ENCOUNTER — Encounter: Payer: Self-pay | Admitting: Physician Assistant

## 2024-03-28 VITALS — BP 102/60 | HR 68 | Ht 63.0 in | Wt 128.4 lb

## 2024-03-28 DIAGNOSIS — K5909 Other constipation: Secondary | ICD-10-CM

## 2024-03-28 DIAGNOSIS — K219 Gastro-esophageal reflux disease without esophagitis: Secondary | ICD-10-CM | POA: Diagnosis not present

## 2024-03-28 DIAGNOSIS — K5900 Constipation, unspecified: Secondary | ICD-10-CM

## 2024-03-28 MED ORDER — FAMOTIDINE 40 MG PO TABS
40.0000 mg | ORAL_TABLET | Freq: Every day | ORAL | 5 refills | Status: AC
Start: 2024-03-28 — End: ?

## 2024-03-28 NOTE — Progress Notes (Signed)
 Chief Complaint: Follow-up GERD and constipation  HPI:     Jennifer Hull is an 83 year old female with a past medical history as listed below including chronic constipation, rheumatoid arthritis and multiple others, known to Dr. Brice Campi, who returns to clinic today for follow-up of chronic constipation and GERD. 06/21/2022 patient seen in clinic by Dr. Brice Campi.  That time following up.  She continues to struggle with constipation.  She was given samples of Ibsrela which was not effective.  Her regimen at that time had continued on Trulance  daily and MiraLAX  once a day with Dulcolax every 5 days and a glycerin  suppository.  Also new upper abdominal pain.  She was given a GI cocktail and Carafate  at the urgent care with improvement.  Discussed finding of mild gastritis on EGD.  Refilled Carafate  twice daily.  Given a trial Linzess  290.  Recommended abdominal ultrasound.    06/29/2022 abdominal ultrasound with fatty liver and 8 mm cyst and otherwise normal.  Discussed possible CT scan if continues with pain.    02/24/2023 Dr. Brice Campi was okay with her taking a half of a bowel prep once a month is a good cleanout.  Recommended Anusol  suppositories for hemorrhoids.  Discussed possible banding.    07/16/2023 patient seen in clinic and at that time was having episodes of epigastric discomfort and bloating with early satiety, using Carafate  as needed as well as Pantoprazole  40 mg daily.  Still somewhat constipated requiring multiple laxatives.  At that time checking on the status of Linzess , increase Pantoprazole  to 40 mg twice a day and continue Carafate  4 times daily as needed.  Also provided with instructions for MiraLAX  bowel prep she could do once a month if she felt backed up.    09/16/2023 CT of the abdomen pelvis showed diffuse hepatic steatosis, moderate volume of formed stool in the colon.    11/01/2023 patient [facility] seen in clinic and doing better requiring her Pantoprazole  only once a day.  Much  better on Linzess  290 mcg a day and daily MiraLAX .  At that time reviewed CT results.  Continue Linzess  to 90 daily and MiraLAX  twice daily.  She was going to decrease her Pantoprazole  to once daily.    Today, patient presents to clinic and explains that she as this is an increasing problem with her bowel movements.  She is taking Linzess  290 mcg daily as well as MiraLAX  twice daily and currently using Dulcolax 2 pills every 4 to 5 days, but has noticed a slowing of her digestive system.  She is wondering if she should take a spoonful of magnesium  oxide every day in addition.  When her constipation flares and she also finds herself taking an increased dose of Pantoprazole , sometimes two 40 mg tabs a day.    Denies fever, chills, weight loss or blood in her stool.  Past Medical History:  Diagnosis Date   Anxiety    Arthritis    Chronic constipation    Depression    Hyperlipidemia    Hypothyroidism    Pneumonia    Rheumatoid arthritis (HCC) 2019   Temporal arteritis (HCC)    Thyroid  disease     Past Surgical History:  Procedure Laterality Date   ABDOMINAL HYSTERECTOMY     BREAST EXCISIONAL BIOPSY Left    BREAST SURGERY     biopsy   CARPAL TUNNEL RELEASE Right    COLONOSCOPY     JOINT REPLACEMENT     REVERSE SHOULDER ARTHROPLASTY Left 06/09/2021   Procedure:  REVERSE SHOULDER ARTHROPLASTY;  Surgeon: Sammye Cristal, MD;  Location: [facility];  Service: Orthopedics;  Laterality: Left;   SHOULDER SURGERY Left 06/09/2021   TEMPORAL ARTERY BIOPSY / LIGATION Bilateral 2014   TONSILLECTOMY     TOTAL SHOULDER ARTHROPLASTY Right 11/22/2017   Procedure: RIGHT REVERSE TOTAL SHOULDER ARTHROPLASTY;  Surgeon: Sammye Cristal, MD;  Location: [facility];  Service: Orthopedics;  Laterality: Right;    Current Outpatient Medications  Medication Sig Dispense Refill   acetaminophen  (TYLENOL ) 500 MG tablet Take 1,000 mg by mouth every 8 (eight) hours as needed for moderate pain.     atorvastatin  (LIPITOR) 10  MG tablet Take 1 tablet (10 mg total) by mouth daily. 90 tablet 2   bisacodyl  (DULCOLAX) 10 MG suppository Every 2 days as needed. 30 suppository 1   Calcium  Carb-Cholecalciferol (CALCIUM  600+D3 PO) Take 1 tablet by mouth daily with lunch.     cetirizine  (ZYRTEC ) 10 MG tablet Take 10 mg by mouth daily.     cholecalciferol (VITAMIN D3) 25 MCG (1000 UNIT) tablet Take 4,000 Units by mouth daily.     ciclopirox  (PENLAC ) 8 % solution Apply topically at bedtime. Apply over nail and surrounding skin. Apply daily over previous coat. After seven (7) days, may remove with alcohol and continue cycle. 6.6 mL 0   estradiol  (ESTRACE ) 0.1 MG/GM vaginal cream Place 0.5 g vaginally 2 (two) times a week. Place 0.5g nightly for two weeks then twice a week after 30 g 2   folic acid  (FOLVITE ) 800 MCG tablet Take 800 mcg by mouth daily.     levothyroxine  (SYNTHROID ) 112 MCG tablet TAKE 1 TABLET(112 MCG) BY MOUTH DAILY BEFORE BREAKFAST 90 tablet 2   linaclotide  (LINZESS ) 290 MCG CAPS capsule Take 1 capsule (290 mcg total) by mouth daily before breakfast. 30 capsule 6   Melatonin 10 MG CAPS Take 10 mg by mouth at bedtime.     meloxicam  (MOBIC ) 7.5 MG tablet Take 7.5 mg by mouth daily.     pantoprazole  (PROTONIX ) 40 MG tablet TAKE 1 TABLET(40 MG) BY MOUTH DAILY 90 tablet 0   polyethylene glycol (MIRALAX  / GLYCOLAX ) 17 g packet Take 17 g by mouth daily.     Pyridoxine HCl (VITAMIN B-6 PO) Take by mouth.     sertraline  (ZOLOFT ) 100 MG tablet TAKE 1 AND 1/2 TABLETS BY MOUTH EVERY DAY 135 tablet 2   sertraline  (ZOLOFT ) 50 MG tablet Take 1 tablet (50 mg total) by mouth daily. 90 tablet 0   sucralfate  (CARAFATE ) 1 GM/10ML suspension Take 1 g by mouth 2 (two) times daily. As needed.     temazepam  (RESTORIL ) 15 MG capsule TAKE 1 CAPSULE BY MOUTH AT BEDTIME AS NEEDED FOR SLEEP 30 capsule 2   Tocilizumab  (ACTEMRA  IV) Inject into the vein every 30 (thirty) days.     TURMERIC CURCUMIN PO Take 3,000 mg by mouth daily.     zinc  gluconate 50 MG tablet Take 50 mg by mouth daily.     Current Facility-Administered Medications  Medication Dose Route Frequency Provider Last Rate Last Admin   denosumab  (PROLIA ) injection 60 mg  60 mg Subcutaneous Once Jordan, Betty G, MD        Allergies as of 03/28/2024 - Review Complete 02/28/2024  Allergen Reaction Noted   Tdap [tetanus-diphth-acell pertussis] Other (See Comments) 07/27/2016   Tetanus toxoid, adsorbed  12/07/2017   Hydroxychloroquine sulfate Rash 06/02/2021   Covid-19 (mrna) vaccine (pfizer) Enrico Hartshorn (mrna) vaccine] Rash 05/13/2021    Family History  Problem Relation Age of Onset   Kidney cancer Mother    Heart disease Father    Breast cancer Maternal Aunt    Prostate cancer Paternal Uncle    Colon cancer Neg Hx    Esophageal cancer Neg Hx    Rectal cancer Neg Hx    Stomach cancer Neg Hx    Inflammatory bowel disease Neg Hx    Liver disease Neg Hx    Pancreatic cancer Neg Hx    Bladder Cancer Neg Hx    Uterine cancer Neg Hx     Social History   Socioeconomic History   Marital status: Married    Spouse name: Not on file   Number of children: 1   Years of education: Not on file   Highest education level: Professional school degree (e.g., MD, DDS, DVM, JD)  Occupational History   Occupation: Nurse  Tobacco Use   Smoking status: Former    Current packs/day: 0.00    Average packs/day: 1.5 packs/day for 8.0 years (12.0 ttl pk-yrs)    Types: Cigarettes    Start date: 54    Quit date: 57    Years since quitting: 47.4   Smokeless tobacco: Never   Tobacco comments:    smoked infrequently when she was young; quit 83 yo   Vaping Use   Vaping status: Never Used  Substance and Sexual Activity   Alcohol use: No   Drug use: No   Sexual activity: Not Currently    Partners: Male    Birth control/protection: Surgical  Other Topics Concern   Not on file  Social History Narrative   Not on file   Social Drivers of Health   Financial Resource  Strain: Low Risk  (02/26/2024)   Overall Financial Resource Strain (CARDIA)    Difficulty of Paying Living Expenses: Not hard at all  Food Insecurity: No Food Insecurity (02/26/2024)   Hunger Vital Sign    Worried About Running Out of Food in the Last Year: Never true    Ran Out of Food in the Last Year: Never true  Transportation Needs: No Transportation Needs (02/26/2024)   PRAPARE - Administrator, Civil Service (Medical): No    Lack of Transportation (Non-Medical): No  Physical Activity: Unknown (02/26/2024)   Exercise Vital Sign    Days of Exercise per Week: 0 days    Minutes of Exercise per Session: Not on file  Stress: No Stress Concern Present (02/26/2024)   [employer] of Occupational Health - Occupational Stress Questionnaire    Feeling of Stress : Only a little  Social Connections: Moderately Integrated (02/26/2024)   Social Connection and Isolation Panel    Frequency of Communication with Friends and Family: More than three times a week    Frequency of Social Gatherings with Friends and Family: Once a week    Attends Religious Services: More than 4 times per year    Active Member of [employer] or Organizations: No    Attends Engineer, structural: Not on file    Marital Status: Married  Catering manager Violence: Not on file    Review of Systems:    Constitutional: No weight loss, fever or chills Cardiovascular: No chest pain Respiratory: No SOB Gastrointestinal: See HPI and otherwise negative   Physical Exam:  Vital signs: BP 102/60   Pulse 68   Ht 5' 3 (1.6 m)   Wt 128 lb 6 oz (58.2 kg)   BMI 22.74 kg/m  Constitutional:   Pleasant elderly female appears to be in NAD, Well developed, Well nourished, alert and cooperative Respiratory: Respirations even and unlabored. Lungs clear to auscultation bilaterally.   No wheezes, crackles, or rhonchi.  Cardiovascular: Normal S1, S2. No MRG. Regular rate and rhythm. No peripheral edema, cyanosis or  pallor.  Gastrointestinal:  Soft, nondistended, nontender. No rebound or guarding. Normal bowel sounds. No appreciable masses or hepatomegaly. Rectal:  Not performed.  Psychiatric: Oriented to person, place and time. Demonstrates good judgement and reason without abnormal affect or behaviors.  RELEVANT LABS AND IMAGING: CBC    Component Value Date/Time   WBC 5.3 05/05/2023 1313   RBC 4.14 05/05/2023 1313   HGB 13.2 05/05/2023 1313   HCT 37.9 05/05/2023 1313   PLT 184 05/05/2023 1313   MCV 91.5 05/05/2023 1313   MCH 31.9 05/05/2023 1313   MCHC 34.8 05/05/2023 1313   RDW 12.4 05/05/2023 1313   LYMPHSABS 1.4 05/05/2023 1313   MONOABS 0.6 05/05/2023 1313   EOSABS 0.2 05/05/2023 1313   BASOSABS 0.1 05/05/2023 1313    CMP     Component Value Date/Time   NA 133 (L) 05/05/2023 1313   NA 144 03/23/2021 0000   K 4.0 05/05/2023 1313   CL 101 05/05/2023 1313   CO2 25 05/05/2023 1313   GLUCOSE 83 05/09/2023 0806   BUN 21 05/05/2023 1313   BUN 16 03/23/2021 0000   CREATININE 0.63 05/05/2023 1313   CALCIUM  9.2 05/05/2023 1313   PROT 6.2 (L) 04/27/2022 2344   ALBUMIN 4.2 04/27/2022 2344   AST 21 04/27/2022 2344   ALT 14 04/27/2022 2344   ALKPHOS 39 04/27/2022 2344   BILITOT 0.4 04/27/2022 2344   GFRNONAA >60 05/05/2023 1313   GFRAA >60 11/23/2017 0710    Assessment: 1.  Chronic constipation: Currently on Linzess  290 mcg daily as well as MiraLAX  twice daily as well as Dulcolax 1-2 tabs every 4 to 5 days, increasing issues with slowing of her bowel 2.  GERD: Controlled on Pantoprazole  40 mg once daily, but sometimes has to use an additional dose if she is constipated  Plan: 1.  Continue Linzess  290 mcg daily 2.  Continue MiraLAX  twice daily (does not want to increase this as it is hard for her to drink this much) 3.  Discussed possibly increasing Dulcolax to 1 tab every day or every other day in addition to her current regimen instead of adding Magnesium  citrate as this may cause  some cramping and discomfort. 4.  Continue Pantoprazole  40 mg daily 5.  Prescribe Famotidine  40 mg as needed for breakthrough reflux symptoms #30 with 5 refills 6.  Patient to follow in clinic with me in 2 to 3 months or sooner if necessary.  Reginal Capra, PA-C  Gastroenterology 03/28/2024, 2:44 PM  Cc: [place], Betty G, MD

## 2024-03-28 NOTE — Patient Instructions (Signed)
 We have sent the following medications to your pharmacy for you to pick up at your convenience: Famotidine  40mg  as needed.  Continue pantoprazole  .  Increase Dulcolax to 1 tablet every other day.  Follow up in 3 months.  Thank you for trusting me with your gastrointestinal care!  Reginal Capra, PA-C  _______________________________________________________  If your blood pressure at your visit was 140/90 or greater, please contact your primary care physician to follow up on this.  _______________________________________________________  If you are age 22 or older, your body mass index should be between 23-30. Your Body mass index is 22.74 kg/m. If this is out of the aforementioned range listed, please consider follow up with your Primary Care Provider.  If you are age 87 or younger, your body mass index should be between 19-25. Your Body mass index is 22.74 kg/m. If this is out of the aformentioned range listed, please consider follow up with your Primary Care Provider.   ________________________________________________________  The [facility] providers would like to encourage you to use MYCHART to communicate with providers for non-urgent requests or questions.  Due to long hold times on the telephone, sending your provider a message by [facility] may be a faster and more efficient way to get a response.  Please allow 48 business hours for a response.  Please remember that this is for non-urgent requests.  _______________________________________________________

## 2024-03-29 NOTE — Progress Notes (Signed)
 Attending Physician's Attestation   I have reviewed the chart.   I agree with the Advanced Practitioner's note, impression, and recommendations with any updates as below.    Corliss Parish, MD [facility]oscopy Office # [phone]

## 2024-04-04 ENCOUNTER — Encounter: Payer: Self-pay | Admitting: Family Medicine

## 2024-04-04 DIAGNOSIS — M545 Low back pain, unspecified: Secondary | ICD-10-CM

## 2024-04-09 ENCOUNTER — Ambulatory Visit (INDEPENDENT_AMBULATORY_CARE_PROVIDER_SITE_OTHER)

## 2024-04-09 ENCOUNTER — Encounter: Payer: Self-pay | Admitting: Family Medicine

## 2024-04-09 ENCOUNTER — Ambulatory Visit: Admitting: Podiatry

## 2024-04-09 ENCOUNTER — Ambulatory Visit: Admitting: Family Medicine

## 2024-04-09 VITALS — BP 126/70 | HR 66 | Ht 63.0 in | Wt 139.0 lb

## 2024-04-09 DIAGNOSIS — M5442 Lumbago with sciatica, left side: Secondary | ICD-10-CM | POA: Diagnosis not present

## 2024-04-09 DIAGNOSIS — M47816 Spondylosis without myelopathy or radiculopathy, lumbar region: Secondary | ICD-10-CM | POA: Diagnosis not present

## 2024-04-09 DIAGNOSIS — M5441 Lumbago with sciatica, right side: Secondary | ICD-10-CM

## 2024-04-09 DIAGNOSIS — G8929 Other chronic pain: Secondary | ICD-10-CM

## 2024-04-09 DIAGNOSIS — M48061 Spinal stenosis, lumbar region without neurogenic claudication: Secondary | ICD-10-CM | POA: Diagnosis not present

## 2024-04-09 DIAGNOSIS — M4310 Spondylolisthesis, site unspecified: Secondary | ICD-10-CM | POA: Diagnosis not present

## 2024-04-09 DIAGNOSIS — M545 Low back pain, unspecified: Secondary | ICD-10-CM | POA: Diagnosis not present

## 2024-04-09 MED ORDER — GABAPENTIN 100 MG PO CAPS: 100.0000 mg | ORAL_CAPSULE | Freq: Three times a day (TID) | ORAL | 3 refills | Status: AC | PRN

## 2024-04-09 MED ORDER — PREDNISONE 10 MG PO TABS: 30.0000 mg | ORAL_TABLET | Freq: Every day | ORAL | 0 refills | Status: AC

## 2024-04-09 NOTE — Patient Instructions (Addendum)
 Thank you for coming in today.   Please get an Xray today before you leave   You should hear from MRI scheduling within 1 week. If you do not hear please let me know.    I've sent a prescription for Prednisone  & Gabapentin  to your pharmacy.   Check back after we get the MRI results

## 2024-04-09 NOTE — Progress Notes (Signed)
 I, Leotis Batter, CMA acting as a scribe for Artist Lloyd, MD.  Jennifer Hull is a 83 y.o. female who presents to [facility] at [facility] today for LBP. Pt was previously seen by Dr. Claudene on 02/28/24 for trigger points.  Today, pt c/o LBP x 2 weeks, worsening over the past week. Pt locates pain to lneft SI joint radiating into the gluteal region and posterior aspect of the leg. C/O n/t/w in the L LE. Sx causing night disturbance. Gets more relief with Meloxicam  than Tylenol  but avoid d/t GI upset. Heat causes worsening sx.  She has had chronic back pain in the past.  She has had rapidly worsening pain radiating down the left leg associated with weakness at times.  She is not currently weak but has been weak in the recent past.  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  Rheumatoid arthritis managed at rheumatology with [employer]   Exam:  BP 126/70   Pulse 66   Ht 5' 3 (1.6 m)   Wt 139 lb (63 kg)   SpO2 97%   BMI 24.62 kg/m  General: Well Developed, well nourished, and in no acute distress.   MSK: L-spine: Normal appearing Nontender to palpation spinal midline. Decreased lumbar motion. Lower extremity strength and reflexes are intact. Antalgic gait.     Lab and Radiology Results  X-ray images lumbar spine obtained today personally and independently interpreted. No acute fractures are visible.  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 Await formal radiology review    Assessment and Plan: 83 y.o. female with chronic low back pain with acute exacerbation with significantly worsening pain radiating down the left leg in an L5 dermatomal pattern.  She does have significant anterior listhesis on her lumbar spine  x-ray per my read today.  Radiology overread is still pending.  Plan for MRI.  I will prescribe prednisone  and gabapentin  for temporary control of symptoms. Anticipate epidural steroid injection after MRI results come back.  PDMP not reviewed this encounter. Orders Placed This Encounter  Procedures   DG Lumbar Spine 2-3 Views    Standing Status:   Future    Number of Occurrences:   1    Expiration Date:   05/10/2024    Reason for Exam (SYMPTOM  OR DIAGNOSIS REQUIRED):   low back pain    Preferred imaging location?:   [facility]   MR LUMBAR SPINE WO CONTRAST    Standing Status:   Future    Expiration Date:   05/10/2024    What is the patient's sedation requirement?:   No Sedation    Does the patient have a pacemaker or implanted devices?:   No    Preferred imaging location?:   [facility] (table limit-500lbs)   Meds ordered this encounter  Medications   gabapentin  (NEURONTIN ) 100 MG capsule    Sig: Take 1-3 capsules (100-300 mg total) by mouth 3 (three) times daily as needed.    Dispense:  30 capsule    Refill:  3   predniSONE  (DELTASONE ) 10 MG tablet    Sig: Take 3 tablets (30 mg total) by mouth daily with breakfast.    Dispense:  15 tablet    Refill:  0     Discussed warning signs or symptoms. Please  see discharge instructions. Patient expresses understanding.   The above documentation has been reviewed and is accurate and complete Artist Lloyd, M.D.

## 2024-04-14 ENCOUNTER — Ambulatory Visit: Payer: Self-pay | Admitting: Family Medicine

## 2024-04-14 DIAGNOSIS — G8929 Other chronic pain: Secondary | ICD-10-CM

## 2024-04-14 NOTE — Progress Notes (Signed)
 Low back x-ray shows some arthritis changes.  No broken bones are visible.

## 2024-04-15 ENCOUNTER — Ambulatory Visit

## 2024-04-15 DIAGNOSIS — M5442 Lumbago with sciatica, left side: Secondary | ICD-10-CM

## 2024-04-15 DIAGNOSIS — G8929 Other chronic pain: Secondary | ICD-10-CM | POA: Diagnosis not present

## 2024-04-16 NOTE — Progress Notes (Signed)
 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  I will order a back injection.  You should hear soon about scheduling.

## 2024-04-18 ENCOUNTER — Ambulatory Visit: Admitting: Podiatry

## 2024-04-20 ENCOUNTER — Encounter: Payer: Self-pay | Admitting: Family Medicine

## 2024-04-21 ENCOUNTER — Encounter: Payer: Self-pay | Admitting: Family Medicine

## 2024-04-22 ENCOUNTER — Ambulatory Visit
Admission: RE | Admit: 2024-04-22 | Discharge: 2024-04-22 | Disposition: A | Discharge status: Home / Self Care | Source: Ambulatory Visit | Attending: Family Medicine | Admitting: Family Medicine

## 2024-04-22 DIAGNOSIS — M5116 Intervertebral disc disorders with radiculopathy, lumbar region: Secondary | ICD-10-CM | POA: Diagnosis not present

## 2024-04-22 DIAGNOSIS — G8929 Other chronic pain: Secondary | ICD-10-CM

## 2024-04-22 DIAGNOSIS — M4726 Other spondylosis with radiculopathy, lumbar region: Secondary | ICD-10-CM | POA: Diagnosis not present

## 2024-04-22 DIAGNOSIS — M4316 Spondylolisthesis, lumbar region: Secondary | ICD-10-CM | POA: Diagnosis not present

## 2024-04-22 MED ORDER — METHYLPREDNISOLONE ACETATE 40 MG/ML INJ SUSP (RADIOLOG
80.0000 mg | Freq: Once | INTRAMUSCULAR | Status: AC
  Administered 2024-04-22: 80 mg via EPIDURAL

## 2024-04-22 MED ORDER — IOPAMIDOL (ISOVUE-M 200) INJECTION 41%
1.0000 mL | Freq: Once | INTRAMUSCULAR | Status: AC
  Administered 2024-04-22: 1 mL via EPIDURAL

## 2024-04-22 NOTE — Discharge Instructions (Signed)

## 2024-04-28 DIAGNOSIS — M19072 Primary osteoarthritis, left ankle and foot: Secondary | ICD-10-CM | POA: Diagnosis not present

## 2024-04-29 DIAGNOSIS — Z111 Encounter for screening for respiratory tuberculosis: Secondary | ICD-10-CM | POA: Diagnosis not present

## 2024-04-29 DIAGNOSIS — R5383 Other fatigue: Secondary | ICD-10-CM | POA: Diagnosis not present

## 2024-04-29 DIAGNOSIS — M0579 Rheumatoid arthritis with rheumatoid factor of multiple sites without organ or systems involvement: Secondary | ICD-10-CM | POA: Diagnosis not present

## 2024-04-30 ENCOUNTER — Ambulatory Visit: Admitting: Physical Medicine and Rehabilitation

## 2024-05-26 ENCOUNTER — Encounter: Payer: Self-pay | Admitting: Family Medicine

## 2024-05-27 ENCOUNTER — Other Ambulatory Visit: Payer: Self-pay | Admitting: Family Medicine

## 2024-05-27 DIAGNOSIS — F411 Generalized anxiety disorder: Secondary | ICD-10-CM

## 2024-05-27 DIAGNOSIS — E785 Hyperlipidemia, unspecified: Secondary | ICD-10-CM

## 2024-05-27 DIAGNOSIS — F331 Major depressive disorder, recurrent, moderate: Secondary | ICD-10-CM

## 2024-05-28 DIAGNOSIS — M0579 Rheumatoid arthritis with rheumatoid factor of multiple sites without organ or systems involvement: Secondary | ICD-10-CM | POA: Diagnosis not present

## 2024-05-29 DIAGNOSIS — H6123 Impacted cerumen, bilateral: Secondary | ICD-10-CM | POA: Diagnosis not present

## 2024-05-30 ENCOUNTER — Ambulatory Visit: Admitting: Family Medicine

## 2024-06-03 ENCOUNTER — Ambulatory Visit: Admitting: Family Medicine

## 2024-06-03 DIAGNOSIS — Z961 Presence of intraocular lens: Secondary | ICD-10-CM | POA: Diagnosis not present

## 2024-06-03 DIAGNOSIS — H04123 Dry eye syndrome of bilateral lacrimal glands: Secondary | ICD-10-CM | POA: Diagnosis not present

## 2024-06-03 DIAGNOSIS — H1045 Other chronic allergic conjunctivitis: Secondary | ICD-10-CM | POA: Diagnosis not present

## 2024-06-03 DIAGNOSIS — M069 Rheumatoid arthritis, unspecified: Secondary | ICD-10-CM | POA: Diagnosis not present

## 2024-06-03 DIAGNOSIS — M316 Other giant cell arteritis: Secondary | ICD-10-CM | POA: Diagnosis not present

## 2024-06-08 ENCOUNTER — Other Ambulatory Visit: Payer: Self-pay | Admitting: Gastroenterology

## 2024-06-13 ENCOUNTER — Ambulatory Visit: Admitting: Podiatry

## 2024-06-18 ENCOUNTER — Other Ambulatory Visit: Payer: Self-pay

## 2024-06-18 DIAGNOSIS — M810 Age-related osteoporosis without current pathological fracture: Secondary | ICD-10-CM

## 2024-06-18 MED ORDER — DENOSUMAB 60 MG/ML ~~LOC~~ SOSY
60.0000 mg | PREFILLED_SYRINGE | SUBCUTANEOUS | Status: AC
  Administered 2024-07-23: 60 mg via SUBCUTANEOUS

## 2024-06-19 ENCOUNTER — Telehealth: Payer: Self-pay

## 2024-06-19 NOTE — Telephone Encounter (Signed)
 Prolia  VOB initiated via [email or web address]  Next Prolia  inj DUE: 07/24/24

## 2024-06-20 ENCOUNTER — Ambulatory Visit

## 2024-06-20 VITALS — BP 118/62 | HR 62 | Temp 97.5°F | Ht 63.0 in | Wt 129.5 lb

## 2024-06-20 DIAGNOSIS — Z0000 Encounter for general adult medical examination without abnormal findings: Secondary | ICD-10-CM

## 2024-06-20 NOTE — Patient Instructions (Addendum)
 Ms. Strahle,  Thank you for taking the time for your Medicare Wellness Visit. I appreciate your continued commitment to your health goals. Please review the care plan we discussed, and feel free to reach out if I can assist you further.  Medicare recommends these wellness visits once per year to help you and your care team stay ahead of potential health issues. These visits are designed to focus on prevention, allowing your provider to concentrate on managing your acute and chronic conditions during your regular appointments.  Please note that Annual Wellness Visits do not include a physical exam. Some assessments may be limited, especially if the visit was conducted virtually. If needed, we may recommend a separate in-person follow-up with your provider.  Ongoing Care Seeing your primary care provider every 3 to 6 months helps us  monitor your health and provide consistent, personalized care.   Referrals If a referral was made during today's visit and you haven't received any updates within two weeks, please contact the referred provider directly to check on the status.  Recommended Screenings:  Health Maintenance  Topic Date Due   Zoster (Shingles) Vaccine (1 of 2) Never done   COVID-19 Vaccine (3 - Pfizer risk series) 02/06/2020   Medicare Annual Wellness Visit  06/20/2025   Pneumococcal Vaccine for age over 56  Completed   DEXA scan (bone density measurement)  Completed   HPV Vaccine  Aged Out   Meningitis B Vaccine  Aged Out   DTaP/Tdap/Td vaccine  Discontinued   Flu Shot  Discontinued   Hepatitis C Screening  Discontinued       06/20/2024    2:45 PM  Advanced Directives  Does Patient Have a Medical Advance Directive? Yes  Type of Estate agent of [place];Living will  Copy of Healthcare Power of Attorney in Chart? No - copy requested   Advance Care Planning is important because it: Ensures you receive medical care that aligns with your values, goals, and  preferences. Provides guidance to your family and loved ones, reducing the emotional burden of decision-making during critical moments.  Vision: Annual vision screenings are recommended for early detection of glaucoma, cataracts, and diabetic retinopathy. These exams can also reveal signs of chronic conditions such as diabetes and high blood pressure.  Dental: Annual dental screenings help detect early signs of oral cancer, gum disease, and other conditions linked to overall health, including heart disease and diabetes.  Please see the attached documents for additional preventive care recommendations.

## 2024-06-20 NOTE — Progress Notes (Signed)
 Subjective:   Jennifer Hull is a 83 y.o. who presents for a Medicare Wellness preventive visit.  As a reminder, Annual Wellness Visits don't include a physical exam, and some assessments may be limited, especially if this visit is performed virtually. We may recommend an in-person follow-up visit with your provider if needed.  Visit Complete: In person    Persons Participating in Visit: Patient.  AWV Questionnaire: No: Patient Medicare AWV questionnaire was not completed prior to this visit.  Cardiac Risk Factors include: advanced age (>44men, >41 women)     Objective:    Today's Vitals   06/20/24 1426  BP: 118/62  Pulse: 62  Temp: (!) 97.5 F (36.4 C)  TempSrc: Oral  SpO2: 96%  Weight: 129 lb 8 oz (58.7 kg)  Height: 5' 3 (1.6 m)   Body mass index is 22.94 kg/m.     06/20/2024    2:45 PM 03/07/2023    1:24 PM 09/10/2022   11:00 AM 04/27/2022   11:15 PM 04/04/2022    3:15 PM 01/10/2022    3:36 PM 06/09/2021   12:30 PM  Advanced Directives  Does Patient Have a Medical Advance Directive? Yes Yes No Yes Yes Yes Yes  Type of Estate agent of [place];Living will   Healthcare Power of [place];Living will Healthcare Power of [place];Living will Living will;Healthcare Power of [employer] Power of [place];Living will  Does patient want to make changes to medical advance directive?  No - Patient declined    No - Patient declined No - Patient declined  Copy of Healthcare Power of Attorney in Chart? No - copy requested    No - copy requested No - copy requested No - copy requested    Current Medications (verified) Outpatient Encounter Medications as of 06/20/2024  Medication Sig   acetaminophen  (TYLENOL ) 500 MG tablet Take 1,000 mg by mouth every 8 (eight) hours as needed for moderate pain.   atorvastatin  (LIPITOR) 10 MG tablet TAKE 1 TABLET(10 MG) BY MOUTH DAILY   bisacodyl  (DULCOLAX) 10 MG suppository Every 2 days as needed.   Calcium   Carb-Cholecalciferol (CALCIUM  600+D3 PO) Take 1 tablet by mouth daily with lunch.   cetirizine  (ZYRTEC ) 10 MG tablet Take 10 mg by mouth daily.   cholecalciferol (VITAMIN D3) 25 MCG (1000 UNIT) tablet Take 4,000 Units by mouth daily.   ciclopirox  (PENLAC ) 8 % solution Apply topically at bedtime. Apply over nail and surrounding skin. Apply daily over previous coat. After seven (7) days, may remove with alcohol and continue cycle.   estradiol  (ESTRACE ) 0.1 MG/GM vaginal cream Place 0.5 g vaginally 2 (two) times a week. Place 0.5g nightly for two weeks then twice a week after   famotidine  (PEPCID ) 40 MG tablet Take 1 tablet (40 mg total) by mouth daily.   folic acid  (FOLVITE ) 800 MCG tablet Take 800 mcg by mouth daily.   gabapentin  (NEURONTIN ) 100 MG capsule Take 1-3 capsules (100-300 mg total) by mouth 3 (three) times daily as needed.   levothyroxine  (SYNTHROID ) 112 MCG tablet TAKE 1 TABLET(112 MCG) BY MOUTH DAILY BEFORE BREAKFAST   linaclotide  (LINZESS ) 290 MCG CAPS capsule Take 1 capsule (290 mcg total) by mouth daily before breakfast.   Melatonin 10 MG CAPS Take 10 mg by mouth at bedtime.   meloxicam  (MOBIC ) 7.5 MG tablet Take 7.5 mg by mouth daily.   pantoprazole  (PROTONIX ) 40 MG tablet TAKE 1 TABLET(40 MG) BY MOUTH DAILY   polyethylene glycol (MIRALAX  / GLYCOLAX ) 17 g  packet Take 17 g by mouth daily.   predniSONE  (DELTASONE ) 10 MG tablet Take 3 tablets (30 mg total) by mouth daily with breakfast. (Patient not taking: Reported on 06/20/2024)   Pyridoxine HCl (VITAMIN B-6 PO) Take by mouth.   sertraline  (ZOLOFT ) 100 MG tablet TAKE 1 AND 1/2 TABLETS BY MOUTH EVERY DAY   sertraline  (ZOLOFT ) 50 MG tablet TAKE 1 TABLET(50 MG) BY MOUTH DAILY   sucralfate  (CARAFATE ) 1 GM/10ML suspension Take 1 g by mouth 2 (two) times daily. As needed.   temazepam  (RESTORIL ) 15 MG capsule TAKE 1 CAPSULE BY MOUTH AT BEDTIME AS NEEDED FOR SLEEP   Tocilizumab  (ACTEMRA  IV) Inject into the vein every 30 (thirty) days.    TURMERIC CURCUMIN PO Take 3,000 mg by mouth daily.   zinc gluconate 50 MG tablet Take 50 mg by mouth daily.   Facility-Administered Encounter Medications as of 06/20/2024  Medication   denosumab  (PROLIA ) injection 60 mg   [START ON 07/21/2024] denosumab  (PROLIA ) injection 60 mg    Allergies (verified) Tdap [tetanus-diphth-acell pertussis]; Tetanus toxoid, adsorbed; Hydroxychloroquine sulfate; and Covid-19 (mrna) vaccine (pfizer) [covid-19 (mrna) vaccine]   History: Past Medical History:  Diagnosis Date   Anxiety    Arthritis    Chronic constipation    Depression    Hyperlipidemia    Hypothyroidism    Pneumonia    Rheumatoid arthritis (HCC) 2019   Temporal arteritis (HCC)    Thyroid  disease    Urethral prolapse    Past Surgical History:  Procedure Laterality Date   ABDOMINAL HYSTERECTOMY     BREAST EXCISIONAL BIOPSY Left    BREAST SURGERY     biopsy   CARPAL TUNNEL RELEASE Right    COLONOSCOPY     JOINT REPLACEMENT     REVERSE SHOULDER ARTHROPLASTY Left 06/09/2021   Procedure: REVERSE SHOULDER ARTHROPLASTY;  Surgeon: Dozier Soulier, MD;  Location: [facility];  Service: Orthopedics;  Laterality: Left;   SHOULDER SURGERY Left 06/09/2021   TEMPORAL ARTERY BIOPSY / LIGATION Bilateral 2014   TONSILLECTOMY     TOTAL SHOULDER ARTHROPLASTY Right 11/22/2017   Procedure: RIGHT REVERSE TOTAL SHOULDER ARTHROPLASTY;  Surgeon: Dozier Soulier, MD;  Location: [facility];  Service: Orthopedics;  Laterality: Right;   Family History  Problem Relation Age of Onset   Kidney cancer Mother    Heart disease Father    Breast cancer Maternal Aunt    Prostate cancer Paternal Uncle    Colon cancer Neg Hx    Esophageal cancer Neg Hx    Rectal cancer Neg Hx    Stomach cancer Neg Hx    Inflammatory bowel disease Neg Hx    Liver disease Neg Hx    Pancreatic cancer Neg Hx    Bladder Cancer Neg Hx    Uterine cancer Neg Hx    Social History   Socioeconomic History   Marital status: Married     Spouse name: Not on file   Number of children: 1   Years of education: Not on file   Highest education level: Professional school degree (e.g., MD, DDS, DVM, JD)  Occupational History   Occupation: Nurse  Tobacco Use   Smoking status: Former    Current packs/day: 0.00    Average packs/day: 1.5 packs/day for 8.0 years (12.0 ttl pk-yrs)    Types: Cigarettes    Start date: 34    Quit date: 1978    Years since quitting: 47.7   Smokeless tobacco: Never   Tobacco comments:    smoked  infrequently when she was young; quit 83 yo   Vaping Use   Vaping status: Never Used  Substance and Sexual Activity   Alcohol use: No   Drug use: No   Sexual activity: Not Currently    Partners: Male    Birth control/protection: Surgical  Other Topics Concern   Not on file  Social History Narrative   Not on file   Social Drivers of Health   Financial Resource Strain: Low Risk  (06/20/2024)   Overall Financial Resource Strain (CARDIA)    Difficulty of Paying Living Expenses: Not hard at all  Food Insecurity: No Food Insecurity (06/20/2024)   Hunger Vital Sign    Worried About Running Out of Food in the Last Year: Never true    Ran Out of Food in the Last Year: Never true  Transportation Needs: No Transportation Needs (06/20/2024)   PRAPARE - Administrator, Civil Service (Medical): No    Lack of Transportation (Non-Medical): No  Physical Activity: Inactive (06/20/2024)   Exercise Vital Sign    Days of Exercise per Week: 0 days    Minutes of Exercise per Session: 0 min  Stress: No Stress Concern Present (06/20/2024)   [employer] of Occupational Health - Occupational Stress Questionnaire    Feeling of Stress: Not at all  Social Connections: Socially Integrated (06/20/2024)   Social Connection and Isolation Panel    Frequency of Communication with Friends and Family: More than three times a week    Frequency of Social Gatherings with Friends and Family: More than three times a week     Attends Religious Services: More than 4 times per year    Active Member of [employer] or Organizations: Yes    Attends Engineer, structural: More than 4 times per year    Marital Status: Married    Tobacco Counseling Counseling given: Not Answered Tobacco comments: smoked infrequently when she was young; quit 83 yo     Clinical Intake:  Pre-visit preparation completed: Yes  Pain : No/denies pain     BMI - recorded: 22.94 Nutritional Status: BMI of 19-24  Normal Nutritional Risks: None Diabetes: No  Lab Results  Component Value Date   HGBA1C 5.4 05/09/2023   HGBA1C 5.5 01/19/2022   HGBA1C 5.4 03/23/2021     How often do you need to have someone help you when you read instructions, pamphlets, or other written materials from your doctor or pharmacy?: 1 - Never  Interpreter Needed?: No  Information entered by :: Rojelio Blush LPN   Activities of Daily Living     06/20/2024    2:41 PM  In your present state of health, do you have any difficulty performing the following activities:  Hearing? 0  Vision? 0  Difficulty concentrating or making decisions? 0  Walking or climbing stairs? 1  Comment Uses [employer] and [employer] or bathing? 0  Doing errands, shopping? 0  Preparing Food and eating ? N  Using the Toilet? N  In the past six months, have you accidently leaked urine? N  Do you have problems with loss of bowel control? N  Managing your Medications? N  Managing your Finances? N  Housekeeping or managing your Housekeeping? N    Patient Care Team: [place], Betty G, MD as PCP - General (Family Medicine) O'Neal, Darryle Ned, MD as PCP - Cardiology (Cardiology)  I have updated your Care Teams any recent Medical Services you may have received from other providers in  the past year.     Assessment:   This is a routine wellness examination for [place].  Hearing/Vision screen Hearing Screening - Comments:: Denies hearing difficulties   Vision  Screening - Comments:: Wears rx glasses - up to date with routine eye exams with  Dr Octavia   Goals Addressed               This Visit's Progress     Remain active (pt-stated)         Depression Screen     08/21/2023    1:43 PM 02/23/2023    3:06 PM 08/25/2022   12:05 PM 04/04/2022    3:16 PM 03/13/2022    9:58 AM 01/18/2022    4:15 PM 07/19/2021    2:52 PM  PHQ 2/9 Scores  PHQ - 2 Score 0 0 0 0 0 0 0  PHQ- 9 Score 5 4 0  7 4 3     Fall Risk     06/20/2024    2:42 PM 02/26/2024    2:11 PM 02/23/2023    2:34 PM 08/25/2022   12:05 PM 04/04/2022    3:16 PM  Fall Risk   Falls in the past year? 1 1 0 0 0  Number falls in past yr: 0 1 0 0 0  Injury with Fall? 1 1 0 0 0  Comment Leg bruises. No medical attention needed      Risk for fall due to : History of fall(s)  Other (Comment) Other (Comment) Medication side effect  Follow up Falls evaluation completed  Falls evaluation completed Falls evaluation completed  Falls evaluation completed;Education provided;Falls prevention discussed      Data saved with a previous flowsheet row definition    MEDICARE RISK AT HOME:  Medicare Risk at Home Any stairs in or around the home?: Yes If so, are there any without handrails?: No Home free of loose throw rugs in walkways, pet beds, electrical cords, etc?: Yes Adequate lighting in your home to reduce risk of falls?: No Life alert?: No Use of a cane, walker or w/c?: Yes Grab bars in the bathroom?: Yes Shower chair or bench in shower?: Yes Elevated toilet seat or a handicapped toilet?: Yes  TIMED UP AND GO:  Was the test performed?  Yes  Length of time to ambulate 10 feet: 10 sec Gait steady and fast without use of assistive device  Cognitive Function: 6CIT completed    12/25/2017    3:26 PM  MMSE - Mini Mental State Exam  Not completed: --      Significant value        06/20/2024    2:45 PM 04/04/2022    3:18 PM  6CIT Screen  What Year? 0 points 0 points  What month? 0  points 0 points  What time? 0 points 0 points  Count back from 20 0 points 0 points  Months in reverse 0 points 0 points  Repeat phrase 0 points 8 points  Total Score 0 points 8 points    Immunizations Immunization History  Administered Date(s) Administered   INFLUENZA, HIGH DOSE SEASONAL PF 06/27/2017, 07/02/2018   PFIZER(Purple Top)SARS-COV-2 Vaccination 12/17/2019, 01/09/2020   Pneumococcal Conjugate-13 06/27/2017   Pneumococcal Polysaccharide-23 07/02/2018    Screening Tests Health Maintenance  Topic Date Due   Zoster Vaccines- Shingrix (1 of 2) Never done   COVID-19 Vaccine (3 - Pfizer risk series) 02/06/2020   Medicare Annual Wellness (AWV)  06/20/2025   Pneumococcal Vaccine: 50+ Years  Completed   DEXA SCAN  Completed   HPV VACCINES  Aged Out   Meningococcal B Vaccine  Aged Out   DTaP/Tdap/Td  Discontinued   Influenza Vaccine  Discontinued   Hepatitis C Screening  Discontinued    Health Maintenance Items Addressed:   Additional Screening:  Vision Screening: Recommended annual ophthalmology exams for early detection of glaucoma and other disorders of the eye. Is the patient up to date with their annual eye exam?  Yes  Who is the provider or what is the name of the office in which the patient attends annual eye exams? Dr Octavia  Dental Screening: Recommended annual dental exams for proper oral hygiene  Community Resource Referral / Chronic Care Management: CRR required this visit?  No   CCM required this visit?  No   Plan:    I have personally reviewed and noted the following in the patient's chart:   Medical and social history Use of alcohol, tobacco or illicit drugs  Current medications and supplements including opioid prescriptions. Patient is not currently taking opioid prescriptions. Functional ability and status Nutritional status Physical activity Advanced directives List of other physicians Hospitalizations, surgeries, and ER visits in  previous 12 months Vitals Screenings to include cognitive, depression, and falls Referrals and appointments  In addition, I have reviewed and discussed with patient certain preventive protocols, quality metrics, and best practice recommendations. A written personalized care plan for preventive services as well as general preventive health recommendations were provided to patient.   Rojelio LELON Blush, LPN   [id number]   After Visit Summary: (In Person-Printed) AVS printed and given to the patient  Notes: Nothing significant to report at this time.

## 2024-06-26 DIAGNOSIS — M0579 Rheumatoid arthritis with rheumatoid factor of multiple sites without organ or systems involvement: Secondary | ICD-10-CM | POA: Diagnosis not present

## 2024-06-30 ENCOUNTER — Encounter: Payer: Self-pay | Admitting: Podiatry

## 2024-07-01 ENCOUNTER — Other Ambulatory Visit: Payer: Self-pay | Admitting: Podiatry

## 2024-07-01 MED ORDER — CICLOPIROX 0.77 % EX GEL: 1.0000 | Freq: Two times a day (BID) | CUTANEOUS | 0 refills | Status: AC

## 2024-07-03 ENCOUNTER — Other Ambulatory Visit (HOSPITAL_COMMUNITY): Payer: Self-pay

## 2024-07-03 NOTE — Telephone Encounter (Signed)
 Pt ready for scheduling for PROLIA  on or after : 07/24/24  Option# 1: Buy/Bill (Office supplied medication)  Out-of-pocket cost due at time of clinic visit: $0  Number of injection/visits approved: ---  Primary: MEDICARE Prolia  co-insurance: 0% Admin fee co-insurance: 0%  Secondary: AARP MEDSUP Prolia  co-insurance:  Admin fee co-insurance:   Medical Benefit Details: Date Benefits were checked: 07/03/24 Deductible: $257 Met of $257 Required/ Coinsurance: 0%/ Admin Fee: 0%  Prior Auth: N/A PA# Expiration Date:   # of doses approved: ----------------------------------------------------------------------- Option# 2- Med Obtained from pharmacy:  Pharmacy benefit: Copay $927.09 (Paid to pharmacy) Admin Fee: 0% (Pay at clinic)  Prior Auth: N/A PA# Expiration Date:   # of doses approved:   If patient wants fill through the pharmacy benefit please send prescription to: [facility], and include estimated need by date in rx notes. Pharmacy will ship medication directly to the office.  Patient NOT eligible for Prolia  Copay Card. Copay Card can make patient's cost as little as $25. Link to apply: [email or web address]://[email or web address].[email or web address]  ** This summary of benefits is an estimation of the patient's out-of-pocket cost. Exact cost may very based on individual plan coverage.

## 2024-07-03 NOTE — Telephone Encounter (Signed)
 SABRA

## 2024-07-10 ENCOUNTER — Other Ambulatory Visit (HOSPITAL_COMMUNITY)
Admission: RE | Admit: 2024-07-10 | Discharge: 2024-07-10 | Disposition: A | Discharge status: Home / Self Care | Source: Ambulatory Visit | Attending: Obstetrics | Admitting: Obstetrics

## 2024-07-10 ENCOUNTER — Encounter: Payer: Self-pay | Admitting: Obstetrics

## 2024-07-10 ENCOUNTER — Ambulatory Visit: Payer: Self-pay | Admitting: Obstetrics

## 2024-07-10 ENCOUNTER — Ambulatory Visit: Admitting: Obstetrics

## 2024-07-10 VITALS — BP 114/62 | HR 61

## 2024-07-10 DIAGNOSIS — R8290 Unspecified abnormal findings in urine: Secondary | ICD-10-CM | POA: Diagnosis not present

## 2024-07-10 DIAGNOSIS — N368 Other specified disorders of urethra: Secondary | ICD-10-CM

## 2024-07-10 DIAGNOSIS — K581 Irritable bowel syndrome with constipation: Secondary | ICD-10-CM

## 2024-07-10 LAB — POCT URINALYSIS DIP (CLINITEK)
Bilirubin, UA: NEGATIVE
Glucose, UA: NEGATIVE mg/dL
Ketones, POC UA: NEGATIVE mg/dL
Leukocytes, UA: NEGATIVE
Nitrite, UA: NEGATIVE
POC PROTEIN,UA: NEGATIVE
Spec Grav, UA: 1.015 (ref 1.010–1.025)
Urobilinogen, UA: 0.2 U/dL
pH, UA: 7 (ref 5.0–8.0)

## 2024-07-10 LAB — URINALYSIS, ROUTINE W REFLEX MICROSCOPIC
Bilirubin Urine: NEGATIVE
Glucose, UA: NEGATIVE mg/dL
Hgb urine dipstick: NEGATIVE
Ketones, ur: NEGATIVE mg/dL
Leukocytes,Ua: NEGATIVE
Nitrite: NEGATIVE
Protein, ur: NEGATIVE mg/dL
Specific Gravity, Urine: 1.006 (ref 1.005–1.030)
pH: 7 (ref 5.0–8.0)

## 2024-07-10 MED ORDER — ESTRADIOL 0.1 MG/GM VA CREA: 0.5000 g | TOPICAL_CREAM | VAGINAL | 2 refills | Status: AC

## 2024-07-10 NOTE — Progress Notes (Signed)
 [facility] Return Visit  SUBJECTIVE  History of Present Illness: Jennifer Hull is a 83 y.o. female seen in follow-up for  urethral prolapse, IBS-C, mixed urinary incontinence, high tone pelvic floor. Plan at last visit was continue vaginal estrogen.   Declines spanish interpreter  Denies return of burning or bleeding, sensation of incomplete emptying, or pain.  Continues vaginal estrogen use 1g 3x/week without compliants Reports using 2 capfuls of miralax  daily with linzess  and intermittent dulcolax 1-2x/week when she is unable to tolerate volume due to GERD symptoms.   History of tobacco use with 16 pack years  Denies gross hematuria, history of pelvic radiation  Past Medical History: Patient  has a past medical history of Anxiety, Arthritis, Chronic constipation, Depression, Hyperlipidemia, Hypothyroidism, Pneumonia, Rheumatoid arthritis (HCC) (2019), Temporal arteritis (HCC), Thyroid  disease, and Urethral prolapse.   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   Medications: She has a current medication list which includes the following prescription(s): acetaminophen , atorvastatin , bisacodyl , calcium  carb-cholecalciferol, cetirizine , ciclopirox , famotidine , folic acid , gabapentin , levothyroxine , linaclotide , melatonin, meloxicam , pantoprazole , polyethylene glycol, pyridoxine hcl, sertraline , sertraline , sucralfate , temazepam , tocilizumab , turmeric, zinc gluconate, cholecalciferol, [START ON 07/11/2024] estradiol , and prednisone , and the following Facility-Administered Medications: denosumab  and [START ON  07/21/2024] denosumab .   Allergies: Patient is allergic to tdap [tetanus-diphth-acell pertussis]; tetanus toxoid, adsorbed; hydroxychloroquine sulfate; and covid-19 (mrna) vaccine (pfizer) [covid-19 (mrna) vaccine].   Social History: Patient  reports that she quit smoking about 47 years ago. Her smoking use included cigarettes. She started smoking about 55 years ago. She has a 12 pack-year smoking history. She has never used smokeless tobacco. She reports that she does not drink alcohol and does not use drugs.     OBJECTIVE     Physical Exam: Vitals:   07/10/24 1051  BP: 114/62  Pulse: 61   Gen: No apparent distress, A&O x 3.  Physical Exam Genitourinary:     Right Labia: No rash, tenderness, lesions, skin changes or Bartholin's cyst.    Left Labia: No tenderness, lesions, skin changes, Bartholin's cyst or rash.    Urethral prolapse present.     No urethral tenderness, mass, discharge or stress urinary incontinence with cough stress test present.     Levator ani is tender.     Pelvic Floor comments: High tone pelvic floo. Vitals reviewed. Exam conducted with a chaperone present.     Lab Results  Component Value Date   COLORU yellow 07/10/2024   CLARITYU cloudy (A) 07/10/2024   GLUCOSEUR negative 07/10/2024   BILIRUBINUR negative 07/10/2024   KETONESU negative 10/19/2023   SPECGRAV 1.015 07/10/2024   RBCUR trace-lysed (A) 07/10/2024   PHUR 7.0 07/10/2024   PROTEINUR NEGATIVE 01/08/2024   UROBILINOGEN 0.2 07/10/2024   LEUKOCYTESUR Negative 07/10/2024   Lab Results  Component Value Date   CREATININE 0.63 05/05/2023   Straight Catheterization Procedure for urine testing: After verbal consent was obtained from the patient for catheterization to assess bladder emptying and residual volume the urethra and surrounding tissues were visualized.  Urine appeared clear yellow. The patient tolerated the procedure well.       ASSESSMENT AND PLAN    Jennifer Hull is a 83 y.o.  with:  1. Urethral prolapse   2. Abnormal urinalysis  3. Irritable bowel syndrome with constipation      Urethral prolapse Assessment & Plan: - previously reviewed anatomy and provided handout for urethral prolapse - prior bladder scan 89mL  - discussed management options including expectant management, medical management, and surgical intervention.  - intermittent irritation resolved with loading dose of vaginal estrogen, continue 1g 3x/week and encouraged to continue application with finger due to myofascial pain. Declines additional intervention at this time due to resolution of symptoms - discussed symptoms that warrant immediate evaluation and intervention if she develops pain, bleeding, or inability to void. Pt desires to avoid surgical intervention - Rx refill for vaginal estrogen  Orders: -     Estradiol ; Place 0.5 g vaginally 3 (three) times a week. Place 0.5g nightly for two weeks then twice a week after  Dispense: 30 g; Refill: 2  Abnormal urinalysis Assessment & Plan: - POCT UA + leuk/heme from catheterized urine despite vaginal estrogen, pending UA microscopy and culture - UA 10/19/23 UA + heme, culture with mixed growth with prior negative UA.  - denies LUTS or UTI symptoms - continue vaginal estrogen - tobacco use 16 pack years - For management of microscopic hematuria, we discussed the importance of work-up including assessing the upper and lower GU tract with CT urogram and cystoscopy. She will pursue this work-up and follow-up afterward to discuss the results if positive findings.  - Cr 0.63 in 05/05/23 - can proceed with renal ultrasound   Orders: -     Estradiol ; Place 0.5 g vaginally 3 (three) times a week. Place 0.5g nightly for two weeks then twice a week after  Dispense: 30 g; Refill: 2 -     POCT URINALYSIS DIP (CLINITEK) -     Urine Microscopic; Future -     Urine Culture; Future  Irritable bowel syndrome with constipation Assessment & Plan: - chronic  constipation since childhood - improved with daily BM and avoid straining - MiraLAX  1 capful BID, mag oxide, Linzess  to 90 mcg, Dulcolax suppository and pills PRN started 3 years ago - For constipation, we previuosly reviewed the importance of a better bowel regimen.  We also discussed the importance of avoiding chronic straining, as it can exacerbate her pelvic floor symptoms; we discussed treating constipation and straining prior to surgery, as postoperative straining can lead to damage to the repair and recurrence of symptoms. We discussed initiating therapy with increasing fluid intake, fiber supplementation, stool softeners, and laxatives such as miralax .   - encouraged squatty potty use and pelvic relaxation - encouraged titration of miralax  as tolerated to reduce dulcolax use    Lianne ONEIDA Gillis, MD

## 2024-07-10 NOTE — Patient Instructions (Signed)
 Continue vaginal estrogen use 1g 3 times a week  For management of microscopic hematuria (blood on urine testing), we discussed the importance of work-up including assessing the upper and lower urinary tract with CT urogram and cystoscopy. We will call you to discuss this work-up if your lab testing remains positive for blood in urine.

## 2024-07-10 NOTE — Assessment & Plan Note (Signed)
-   chronic constipation since childhood - improved with daily BM and avoid straining - MiraLAX  1 capful BID, mag oxide, Linzess  to 90 mcg, Dulcolax suppository and pills PRN started 3 years ago - For constipation, we previuosly reviewed the importance of a better bowel regimen.  We also discussed the importance of avoiding chronic straining, as it can exacerbate her pelvic floor symptoms; we discussed treating constipation and straining prior to surgery, as postoperative straining can lead to damage to the repair and recurrence of symptoms. We discussed initiating therapy with increasing fluid intake, fiber supplementation, stool softeners, and laxatives such as miralax .   - encouraged squatty potty use and pelvic relaxation - encouraged titration of miralax  as tolerated to reduce dulcolax use

## 2024-07-10 NOTE — Assessment & Plan Note (Signed)
-   previously reviewed anatomy and provided handout for urethral prolapse - prior bladder scan 89mL  - discussed management options including expectant management, medical management, and surgical intervention.  - intermittent irritation resolved with loading dose of vaginal estrogen, continue 1g 3x/week and encouraged to continue application with finger due to myofascial pain. Declines additional intervention at this time due to resolution of symptoms - discussed symptoms that warrant immediate evaluation and intervention if she develops pain, bleeding, or inability to void. Pt desires to avoid surgical intervention - Rx refill for vaginal estrogen

## 2024-07-10 NOTE — Assessment & Plan Note (Addendum)
-   POCT UA + leuk/heme from catheterized urine despite vaginal estrogen, pending UA microscopy and culture - UA 10/19/23 UA + heme, culture with mixed growth with prior negative UA.  - denies LUTS or UTI symptoms - continue vaginal estrogen - tobacco use 16 pack years - For management of microscopic hematuria, we discussed the importance of work-up including assessing the upper and lower GU tract with CT urogram and cystoscopy. She will pursue this work-up and follow-up afterward to discuss the results if positive findings.  - Cr 0.63 in 05/05/23 - can proceed with renal ultrasound

## 2024-07-11 LAB — URINE CULTURE: Culture: NO GROWTH

## 2024-07-16 ENCOUNTER — Telehealth: Payer: Self-pay | Admitting: Physician Assistant

## 2024-07-16 NOTE — Telephone Encounter (Signed)
 Inbound call from patient stating she has been having bloating. Patient's states she can not and will not wait until December to be seen. Please advise, thank you

## 2024-07-17 ENCOUNTER — Encounter: Payer: Self-pay | Admitting: Physician Assistant

## 2024-07-17 ENCOUNTER — Ambulatory Visit: Admitting: Physician Assistant

## 2024-07-17 VITALS — BP 106/58 | HR 68 | Ht 63.0 in | Wt 127.2 lb

## 2024-07-17 DIAGNOSIS — K5909 Other constipation: Secondary | ICD-10-CM

## 2024-07-17 DIAGNOSIS — Z79899 Other long term (current) drug therapy: Secondary | ICD-10-CM | POA: Diagnosis not present

## 2024-07-17 DIAGNOSIS — Z87891 Personal history of nicotine dependence: Secondary | ICD-10-CM | POA: Diagnosis not present

## 2024-07-17 DIAGNOSIS — K219 Gastro-esophageal reflux disease without esophagitis: Secondary | ICD-10-CM | POA: Diagnosis not present

## 2024-07-17 DIAGNOSIS — Z79890 Hormone replacement therapy (postmenopausal): Secondary | ICD-10-CM

## 2024-07-17 MED ORDER — AMBULATORY NON FORMULARY MEDICATION: 5 refills | Status: AC

## 2024-07-17 NOTE — Progress Notes (Signed)
 Chief Complaint: Abdominal pain  HPI:    Jennifer Hull is an 83 year old female with a past medical history as listed below including chronic constipation, rheumatoid arthritis and multiple others, known to Dr. Wilhelmenia, who returns to clinic today for an acute complaint of abdominal pain.    04/26/2022 EGD with 2 cm hiatal hernia and erythematous mucosa in the stomach.  Biopsy showed minimal inflammation.    04/26/22 colonoscopy with four 3-10 mm polyps in the transverse colon hepatic flexure and ascending colon.  Nonthrombosed nonbleeding external and internal hemorrhoids.  Repeat recommended in 3 years.    06/21/2022 patient seen in clinic by Dr. Wilhelmenia.  That time following up.  She continues to struggle with constipation.  She was given samples of Ibsrela which was not effective.  Her regimen at that time had continued on Trulance  daily and MiraLAX  once a day with Dulcolax every 5 days and a glycerin  suppository.  Also new upper abdominal pain.  She was given a GI cocktail and Carafate  at the urgent care with improvement.  Discussed finding of mild gastritis on EGD.  Refilled Carafate  twice daily.  Given a trial Linzess  290.  Recommended abdominal ultrasound.    06/29/2022 abdominal ultrasound with fatty liver and 8 mm cyst and otherwise normal.  Discussed possible CT scan if continues with pain.    02/24/2023 Dr. Wilhelmenia was okay with her taking a half of a bowel prep once a month as a good cleanout.  Recommended Anusol  suppositories for hemorrhoids.  Discussed possible banding.    07/16/2023 patient seen in clinic and at that time was having episodes of epigastric discomfort and bloating with early satiety, using Carafate  as needed as well as Pantoprazole  40 mg daily.  Still somewhat constipated requiring multiple laxatives.  At that time checking on the status of Linzess , increase Pantoprazole  to 40 mg twice a day and continue Carafate  4 times daily as needed.  Also provided with instructions  for MiraLAX  bowel prep she could do once a month if she felt backed up.    09/16/2023 CT of the abdomen pelvis showed diffuse hepatic steatosis, moderate volume of formed stool in the colon.    11/01/2023 patient [facility] seen in clinic and doing better requiring her Pantoprazole  only once a day.  Much better on Linzess  290 mcg a day and daily MiraLAX .  At that time reviewed CT results.  Continue Linzess  to 90 daily and MiraLAX  twice daily.  She was going to decrease her Pantoprazole  to once daily.    03/28/2024 patient seen in clinic and at that time had increasing problems with her bowel movements.  Taking Linzess  to 90 mcg daily as well as MiraLAX  twice a day and using Dulcolax 2 pills every 4 to 5 days.  At that time reflux controlled on Pantoprazole  40 mg once a day.  Continued on Linzess  to 90 mcg daily.  Continued on MiraLAX  twice a day.  Discussed increasing Dulcolax to 1 tab every other day.  Recommended against magnesium  citrate as this may cause cramping and discomfort.  Continued on Pantoprazole  40 mg daily and added Famotidine  40 mg as needed for breakthrough reflux.    07/16/2024 patient called in describing bloating and gastric acidity.  Pepcid  40 mg was not working well.  Even taking Pantoprazole  in the afternoon most the time did not work.  Was trying chamomile tea as well.  Patient added on urgently for this afternoon.    Today, patient presents to clinic and tells me  that for about 3 to 4 days now she had had extreme constipation regardless of using her Linzess  290 mcg daily.  She was unable to get in MiraLAX  as this made her feel more bloated and typically she uses 2-3 doses a day.  Instead she increased her dosage of Dulcolax using 2 tabs a day and finally this morning was able to have a lot of bowel movements and this helped with her bloating and abdominal discomfort and also started to help with her reflux which was flared due to her constipation.  Tells me she was even taking Pantoprazole  40  twice a day and it was not really helping with her upper symptoms.  She feels much better now after her bowel movements earlier today.    Denies fever, chills or weight loss.   Past Medical History:  Diagnosis Date   Anxiety    Arthritis    Chronic constipation    Depression    Hyperlipidemia    Hypothyroidism    Pneumonia    Rheumatoid arthritis (HCC) 2019   Temporal arteritis (HCC)    Thyroid  disease    Urethral prolapse     Past Surgical History:  Procedure Laterality Date   ABDOMINAL HYSTERECTOMY     BREAST EXCISIONAL BIOPSY Left    BREAST SURGERY     biopsy   CARPAL TUNNEL RELEASE Right    COLONOSCOPY     JOINT REPLACEMENT     REVERSE SHOULDER ARTHROPLASTY Left 06/09/2021   Procedure: REVERSE SHOULDER ARTHROPLASTY;  Surgeon: Dozier Soulier, MD;  Location: [facility];  Service: Orthopedics;  Laterality: Left;   SHOULDER SURGERY Left 06/09/2021   TEMPORAL ARTERY BIOPSY / LIGATION Bilateral 2014   TONSILLECTOMY     TOTAL SHOULDER ARTHROPLASTY Right 11/22/2017   Procedure: RIGHT REVERSE TOTAL SHOULDER ARTHROPLASTY;  Surgeon: Dozier Soulier, MD;  Location: [facility];  Service: Orthopedics;  Laterality: Right;    Current Outpatient Medications  Medication Sig Dispense Refill   acetaminophen  (TYLENOL ) 500 MG tablet Take 1,000 mg by mouth every 8 (eight) hours as needed for moderate pain.     atorvastatin  (LIPITOR) 10 MG tablet TAKE 1 TABLET(10 MG) BY MOUTH DAILY 90 tablet 2   bisacodyl  (DULCOLAX) 10 MG suppository Every 2 days as needed. 30 suppository 1   Calcium  Carb-Cholecalciferol (CALCIUM  600+D3 PO) Take 1 tablet by mouth daily with lunch.     cetirizine  (ZYRTEC ) 10 MG tablet Take 10 mg by mouth daily.     cholecalciferol (VITAMIN D3) 25 MCG (1000 UNIT) tablet Take 4,000 Units by mouth daily.     Ciclopirox  0.77 % gel Apply 1 Application topically 2 (two) times daily. 45 g 0   estradiol  (ESTRACE ) 0.1 MG/GM vaginal cream Place 0.5 g vaginally 3 (three) times a week. Place  0.5g nightly for two weeks then twice a week after 30 g 2   famotidine  (PEPCID ) 40 MG tablet Take 1 tablet (40 mg total) by mouth daily. 30 tablet 5   folic acid  (FOLVITE ) 800 MCG tablet Take 800 mcg by mouth daily.     gabapentin  (NEURONTIN ) 100 MG capsule Take 1-3 capsules (100-300 mg total) by mouth 3 (three) times daily as needed. 30 capsule 3   levothyroxine  (SYNTHROID ) 112 MCG tablet TAKE 1 TABLET(112 MCG) BY MOUTH DAILY BEFORE BREAKFAST 90 tablet 2   linaclotide  (LINZESS ) 290 MCG CAPS capsule Take 1 capsule (290 mcg total) by mouth daily before breakfast. 30 capsule 6   Melatonin 10 MG CAPS Take 10 mg by  mouth at bedtime.     meloxicam  (MOBIC ) 7.5 MG tablet Take 7.5 mg by mouth daily.     pantoprazole  (PROTONIX ) 40 MG tablet TAKE 1 TABLET(40 MG) BY MOUTH DAILY 90 tablet 0   polyethylene glycol (MIRALAX  / GLYCOLAX ) 17 g packet Take 17 g by mouth daily.     predniSONE  (DELTASONE ) 10 MG tablet Take 3 tablets (30 mg total) by mouth daily with breakfast. (Patient not taking: Reported on 06/20/2024) 15 tablet 0   Pyridoxine HCl (VITAMIN B-6 PO) Take by mouth.     sertraline  (ZOLOFT ) 100 MG tablet TAKE 1 AND 1/2 TABLETS BY MOUTH EVERY DAY 135 tablet 2   sertraline  (ZOLOFT ) 50 MG tablet TAKE 1 TABLET(50 MG) BY MOUTH DAILY 90 tablet 2   sucralfate  (CARAFATE ) 1 GM/10ML suspension Take 1 g by mouth 2 (two) times daily. As needed.     temazepam  (RESTORIL ) 15 MG capsule TAKE 1 CAPSULE BY MOUTH AT BEDTIME AS NEEDED FOR SLEEP 30 capsule 2   Tocilizumab  (ACTEMRA  IV) Inject into the vein every 30 (thirty) days.     TURMERIC CURCUMIN PO Take 3,000 mg by mouth daily.     zinc gluconate 50 MG tablet Take 50 mg by mouth daily.     Current Facility-Administered Medications  Medication Dose Route Frequency Provider Last Rate Last Admin   denosumab  (PROLIA ) injection 60 mg  60 mg Subcutaneous Once [place], Betty G, MD       [START ON 07/21/2024] denosumab  (PROLIA ) injection 60 mg  60 mg Subcutaneous Q6 months  [place], Betty G, MD        Allergies as of 07/17/2024 - Review Complete 07/10/2024  Allergen Reaction Noted   Tdap [tetanus-diphth-acell pertussis] Other (See Comments) 07/27/2016   Tetanus toxoid, adsorbed  12/07/2017   Hydroxychloroquine sulfate Rash 06/02/2021   Covid-19 (mrna) vaccine [place]) [covid-19 (mrna) vaccine] Rash 05/13/2021    Family History  Problem Relation Age of Onset   Kidney cancer Mother    Heart disease Father    Breast cancer Maternal Aunt    Prostate cancer Paternal Uncle    Colon cancer Neg Hx    Esophageal cancer Neg Hx    Rectal cancer Neg Hx    Stomach cancer Neg Hx    Inflammatory bowel disease Neg Hx    Liver disease Neg Hx    Pancreatic cancer Neg Hx    Bladder Cancer Neg Hx    Uterine cancer Neg Hx     Social History   Socioeconomic History   Marital status: Married    Spouse name: Not on file   Number of children: 1   Years of education: Not on file   Highest education level: Professional school degree (e.g., MD, DDS, DVM, JD)  Occupational History   Occupation: Nurse  Tobacco Use   Smoking status: Former    Current packs/day: 0.00    Average packs/day: 1.5 packs/day for 8.0 years (12.0 ttl pk-yrs)    Types: Cigarettes    Start date: 29    Quit date: 78    Years since quitting: 47.8   Smokeless tobacco: Never   Tobacco comments:    smoked infrequently when she was young; quit 83 yo   Vaping Use   Vaping status: Never Used  Substance and Sexual Activity   Alcohol use: No   Drug use: No   Sexual activity: Not Currently    Partners: Male    Birth control/protection: Surgical  Other Topics Concern   Not  on file  Social History Narrative   Not on file   Social Drivers of Health   Financial Resource Strain: Low Risk  (06/20/2024)   Overall Financial Resource Strain (CARDIA)    Difficulty of Paying Living Expenses: Not hard at all  Food Insecurity: No Food Insecurity (06/20/2024)   Hunger Vital Sign    Worried About  Running Out of Food in the Last Year: Never true    Ran Out of Food in the Last Year: Never true  Transportation Needs: No Transportation Needs (06/20/2024)   PRAPARE - Administrator, Civil Service (Medical): No    Lack of Transportation (Non-Medical): No  Physical Activity: Inactive (06/20/2024)   Exercise Vital Sign    Days of Exercise per Week: 0 days    Minutes of Exercise per Session: 0 min  Stress: No Stress Concern Present (06/20/2024)   [employer] of Occupational Health - Occupational Stress Questionnaire    Feeling of Stress: Not at all  Social Connections: Socially Integrated (06/20/2024)   Social Connection and Isolation Panel    Frequency of Communication with Friends and Family: More than three times a week    Frequency of Social Gatherings with Friends and Family: More than three times a week    Attends Religious Services: More than 4 times per year    Active Member of [employer] or Organizations: Yes    Attends Engineer, structural: More than 4 times per year    Marital Status: Married  Catering manager Violence: Not At Risk (06/20/2024)   Humiliation, Afraid, Rape, and Kick questionnaire    Fear of Current or Ex-Partner: No    Emotionally Abused: No    Physically Abused: No    Sexually Abused: No    Review of Systems:    Constitutional: No weight loss, fever or chills Cardiovascular: No chest pain  Respiratory: No SOB  Gastrointestinal: See HPI and otherwise negative   Physical Exam:  Vital signs: BP (!) 106/58   Pulse 68   Ht 5' 3 (1.6 m)   Wt 127 lb 4 oz (57.7 kg)   SpO2 98%   BMI 22.54 kg/m    Constitutional:   Very Pleasant Elderly Caucasian female appears to be in NAD, Well developed, Well nourished, alert and cooperative Respiratory: Respirations even and unlabored. Lungs clear to auscultation bilaterally.   No wheezes, crackles, or rhonchi.  Cardiovascular: Normal S1, S2. No MRG. Regular rate and rhythm. No peripheral edema,  cyanosis or pallor.  Gastrointestinal:  Soft, nondistended, nontender. No rebound or guarding. Normal bowel sounds. No appreciable masses or hepatomegaly. Rectal:  Not performed.  Psychiatric: Demonstrates good judgement and reason without abnormal affect or behaviors.  RELEVANT LABS AND IMAGING: CBC    Component Value Date/Time   WBC 5.3 05/05/2023 1313   RBC 4.14 05/05/2023 1313   HGB 13.2 05/05/2023 1313   HCT 37.9 05/05/2023 1313   PLT 184 05/05/2023 1313   MCV 91.5 05/05/2023 1313   MCH 31.9 05/05/2023 1313   MCHC 34.8 05/05/2023 1313   RDW 12.4 05/05/2023 1313   LYMPHSABS 1.4 05/05/2023 1313   MONOABS 0.6 05/05/2023 1313   EOSABS 0.2 05/05/2023 1313   BASOSABS 0.1 05/05/2023 1313    CMP     Component Value Date/Time   NA 133 (L) 05/05/2023 1313   NA 144 03/23/2021 0000   K 4.0 05/05/2023 1313   CL 101 05/05/2023 1313   CO2 25 05/05/2023 1313   GLUCOSE 83  05/09/2023 0806   BUN 21 05/05/2023 1313   BUN 16 03/23/2021 0000   CREATININE 0.63 05/05/2023 1313   CALCIUM  9.2 05/05/2023 1313   PROT 6.2 (L) 04/27/2022 2344   ALBUMIN 4.2 04/27/2022 2344   AST 21 04/27/2022 2344   ALT 14 04/27/2022 2344   ALKPHOS 39 04/27/2022 2344   BILITOT 0.4 04/27/2022 2344   GFRNONAA >60 05/05/2023 1313   GFRAA >60 11/23/2017 0710    Assessment: 1.  GERD: Typically controlled on Pantoprazole  40 mg daily but sometimes has to use an additional dose, at last visit in June Famotidine  was added 40 mg for as needed use, most recently symptoms flared when she was constipated for the past 3 to 4 days, already some better after producing a lot of stool earlier today; known mild gastritis 2.  Chronic constipation: Typically controlled with Linzess  290 mcg daily as well as MiraLAX  twice a day and Dulcolax 1-2 tabs every other day, recently was unable to take MiraLAX  for a few days which increased constipation, better now that she had a bunch of bowel movements this morning  Plan: 1.  Prescribed  GI cocktail 5-10 mL every 6 hours as needed for episodes of increased reflux and epigastric discomfort in the future. 2.  Otherwise continue current medications including Linzess , MiraLAX , Dulcolax, Pantoprazole  and as needed Famotidine  3.  Patient was very thankful for us  fitting her in today.  Again she is much better now that she had bowel movements earlier today  4.  Patient to follow in clinic with us  as needed.  Delon Failing, PA-C [facility] 07/17/2024, 3:00 PM  Cc: [place], Betty G, MD

## 2024-07-17 NOTE — Telephone Encounter (Signed)
 Patient has also sent a message through [facility] CHART. Message with her concerns forwarded to provider.

## 2024-07-17 NOTE — Patient Instructions (Signed)
 We have sent the following medications to your pharmacy for you to pick up at your convenience: GI cocktail

## 2024-07-18 NOTE — Progress Notes (Signed)
 Attending Physician's Attestation   I have reviewed the chart.   I agree with the Advanced Practitioner's note, impression, and recommendations with any updates as below.    Corliss Parish, MD [facility]oscopy Office # [phone]

## 2024-07-23 ENCOUNTER — Ambulatory Visit

## 2024-07-23 DIAGNOSIS — M810 Age-related osteoporosis without current pathological fracture: Secondary | ICD-10-CM | POA: Diagnosis not present

## 2024-07-23 NOTE — Progress Notes (Signed)
 Patient is in office today for a nurse visit for Prolia  Injection per Dr. Gib order. Patient Injection was given in the  Left arm. Patient tolerated injection well.

## 2024-07-24 ENCOUNTER — Ambulatory Visit

## 2024-07-24 DIAGNOSIS — M0579 Rheumatoid arthritis with rheumatoid factor of multiple sites without organ or systems involvement: Secondary | ICD-10-CM | POA: Diagnosis not present

## 2024-07-28 NOTE — Progress Notes (Unsigned)
 Cardiology Office Note:  .   Date:  07/30/2024  ID:  Jennifer Hull, DOB May 08, 1941, MRN [id number] PCP: [place], Betty G, MD  [facility] Providers Cardiologist:  Darryle ONEIDA Decent, MD  History of Present Illness: .    Chief Complaint  Patient presents with   Follow-up    Jennifer Hull is a 83 y.o. female with history of aortic regurgitation who presents for follow-up.    History of Present Illness   Jennifer Hull is a 83 year old female with mild aortic regurgitation who presents for follow-up.  She was evaluated last year for  a fall and mildly abnormal troponin. No chest pain. During which an echocardiogram revealed mild aortic regurgitation. Since then, no further workup has been pursued. No chest pain, dyspnea, or falls since her last visit.  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 She is not currently on any antihypertensive medications.  She remains active at home but avoids walking due to significant foot problems. Occasionally experiences fatigue, attributing it to her age and underlying conditions, and notes a decrease in her ability to perform activities she used to do.          Problem List  RA/Temporal arteritis  HLD -T chol 147, HDL 69, LDL 65, TG 60 Coronary calcifications on chest CT Aortic regurgitation -mild 2024    ROS: All other ROS reviewed and negative. Pertinent positives noted in the HPI.     Studies Reviewed: SABRA   EKG Interpretation Date/Time:  Wednesday July 30 2024 09:50:03 EDT Ventricular Rate:  62 PR Interval:  124 QRS Duration:  90 QT Interval:  390 QTC Calculation: 395 R Axis:   62  Text Interpretation: Normal sinus rhythm Normal ECG Confirmed by Decent Darryle [phone]) on 07/30/2024 9:50:45 AM   TTE 06/06/2023  1. Left ventricular ejection fraction, by  estimation, is 60 to 65%. The  left ventricle has normal function. The left ventricle has no regional  wall motion abnormalities. Left ventricular diastolic parameters were  normal.   2. Right ventricular systolic function is normal. The right ventricular  size is normal.   3. Left atrial size was mildly dilated.   4. The mitral valve is normal in structure. Trivial mitral valve  regurgitation. No evidence of mitral stenosis.   5. The aortic valve is tricuspid. Aortic valve regurgitation is mild. No  aortic stenosis is present. Aortic regurgitation PHT measures 579 msec.   6. The inferior vena cava is normal in size with greater than 50%  respiratory variability, suggesting right atrial pressure of 3 mmHg.  Physical Exam:   VS:  BP (!) 156/68   Pulse 62   Ht 5' 3 (1.6 m)   Wt 124 lb 9.6 oz (56.5 kg)   SpO2 97%   BMI 22.07 kg/m    Wt Readings from Last 3 Encounters:  07/30/24 124 lb 9.6 oz (56.5 kg)  07/17/24 127 lb 4 oz (57.7 kg)  06/20/24 129 lb 8 oz (58.7 kg)    GEN: Well nourished, well developed in no acute distress NECK: No JVD; No carotid bruits CARDIAC: RRR, no murmurs, rubs, gallops RESPIRATORY:  Clear to auscultation without rales, wheezing or rhonchi  ABDOMEN: Soft, non-tender, non-distended EXTREMITIES:  No edema; No deformity  ASSESSMENT AND PLAN: .   Assessment and Plan    Aortic regurgitation  Mild aortic regurgitation on echocardiogram. Asymptomatic. No intervention needed. - Order echocardiogram to assess aortic valve status. - Monitor for symptoms such as chest pain or dyspnea. - Annual follow-up to reassess condition.  Elevated BP without diagnosis of HTN Recent increase in blood pressure to 158 mmHg from historical 110/60 mmHg. No antihypertensive medications. - Monitor blood pressure regularly.  Rheumatoid arthritis and temporal arteritis Receiving Actemra  (tocilizumab ) IV infusions. No new symptoms. - Continue Actemra  (tocilizumab ) IV infusions as  scheduled.   Coronary calcifications HLD - no need for ASA. On lipitor 10 mg daily. LDL at goal.              Follow-up: Return in about 1 year (around 07/30/2025).  Signed, Darryle DASEN. Barbaraann, MD, [facility]  [facility]  [address] [place], [place] [address] [phone]  10:04 AM

## 2024-07-30 ENCOUNTER — Ambulatory Visit: Attending: Cardiology | Admitting: Cardiovascular Disease

## 2024-07-30 ENCOUNTER — Encounter: Payer: Self-pay | Admitting: Cardiovascular Disease

## 2024-07-30 VITALS — BP 156/68 | HR 62 | Ht 63.0 in | Wt 124.6 lb

## 2024-07-30 DIAGNOSIS — E782 Mixed hyperlipidemia: Secondary | ICD-10-CM | POA: Insufficient documentation

## 2024-07-30 DIAGNOSIS — I351 Nonrheumatic aortic (valve) insufficiency: Secondary | ICD-10-CM | POA: Insufficient documentation

## 2024-07-30 DIAGNOSIS — I2510 Atherosclerotic heart disease of native coronary artery without angina pectoris: Secondary | ICD-10-CM | POA: Diagnosis not present

## 2024-07-30 NOTE — Patient Instructions (Signed)
 Medication Instructions:  Your physician recommends that you continue on your current medications as directed. Please refer to the Current Medication list given to you today.  *If you need a refill on your cardiac medications before your next appointment, please call your pharmacy*  Lab Work: NONE If you have labs (blood work) drawn today and your tests are completely normal, you will receive your results only by: MyChart Message (if you have MyChart) OR A paper copy in the mail If you have any lab test that is abnormal or we need to change your treatment, we will call you to review the results.  Testing/Procedures: Echocardiogram Your physician has requested that you have an echocardiogram. Echocardiography is a painless test that uses sound waves to create images of your heart. It provides your doctor with information about the size and shape of your heart and how well your heart's chambers and valves are working. This procedure takes approximately one hour. There are no restrictions for this procedure. Please do NOT wear cologne, perfume, aftershave, or lotions (deodorant is allowed). Please arrive 15 minutes prior to your appointment time.  Please note: We ask at that you not bring children with you during ultrasound (echo/ vascular) testing. Due to room size and safety concerns, children are not allowed in the ultrasound rooms during exams. Our front office staff cannot provide observation of children in our lobby area while testing is being conducted. An adult accompanying a patient to their appointment will only be allowed in the ultrasound room at the discretion of the ultrasound technician under special circumstances. We apologize for any inconvenience.   Follow-Up: At [facility], you and your health needs are our priority.  As part of our continuing mission to provide you with exceptional heart care, our providers are all part of one team.  This team includes your primary  Cardiologist (physician) and Advanced Practice Providers or APPs (Physician Assistants and Nurse Practitioners) who all work together to provide you with the care you need, when you need it.  Your next appointment:   1 year(s)  Provider:   One of our Advanced Practice Providers (APPs): Morse Clause, PA-C  Lamarr Satterfield, NP Miriam Shams, NP  Olivia Pavy, PA-C Josefa Beauvais, NP  Leontine Salen, PA-C Orren Fabry, PA-C  [place], PA-C Ernest Dick, NP  Damien Braver, NP Jon Hails, PA-C  Waddell Donath, PA-C    Dayna Dunn, PA-C  Scott Weaver, PA-C Lum Louis, NP Katlyn West, NP Callie Goodrich, PA-C  Xika Zhao, NP Sheng Haley, PA-C    Kathleen Johnson, PA-C   We recommend signing up for the patient portal called MyChart.  Sign up information is provided on this After Visit Summary.  MyChart is used to connect with patients for Virtual Visits (Telemedicine).  Patients are able to view lab/test results, encounter notes, upcoming appointments, etc.  Non-urgent messages can be sent to your provider as well.   To learn more about what you can do with MyChart, go to [email or web address].   Other Instructions Keep track of your blood pressure at home

## 2024-08-11 ENCOUNTER — Encounter: Payer: Self-pay | Admitting: Radiology

## 2024-08-19 ENCOUNTER — Other Ambulatory Visit: Payer: Self-pay | Admitting: *Deleted

## 2024-08-19 MED ORDER — PANTOPRAZOLE SODIUM 40 MG PO TBEC: 40.0000 mg | DELAYED_RELEASE_TABLET | Freq: Every day | ORAL | 3 refills | Status: AC

## 2024-08-25 DIAGNOSIS — M0579 Rheumatoid arthritis with rheumatoid factor of multiple sites without organ or systems involvement: Secondary | ICD-10-CM | POA: Diagnosis not present

## 2024-08-25 DIAGNOSIS — Z79899 Other long term (current) drug therapy: Secondary | ICD-10-CM | POA: Diagnosis not present

## 2024-08-26 ENCOUNTER — Encounter: Payer: Self-pay | Admitting: Family Medicine

## 2024-08-26 ENCOUNTER — Ambulatory Visit: Admitting: Family Medicine

## 2024-08-26 VITALS — BP 122/72 | HR 63 | Temp 98.7°F | Resp 16 | Ht 63.0 in | Wt 129.4 lb

## 2024-08-26 DIAGNOSIS — M069 Rheumatoid arthritis, unspecified: Secondary | ICD-10-CM

## 2024-08-26 DIAGNOSIS — E785 Hyperlipidemia, unspecified: Secondary | ICD-10-CM

## 2024-08-26 DIAGNOSIS — G4700 Insomnia, unspecified: Secondary | ICD-10-CM

## 2024-08-26 DIAGNOSIS — F411 Generalized anxiety disorder: Secondary | ICD-10-CM

## 2024-08-26 DIAGNOSIS — E039 Hypothyroidism, unspecified: Secondary | ICD-10-CM

## 2024-08-26 LAB — LAB REPORT - SCANNED: EGFR: 66

## 2024-08-26 NOTE — Assessment & Plan Note (Addendum)
 Currently she is on tocilizumab  IV every 30 days. Following with rheumatologist q 6 months. Fall precautions discussed.

## 2024-08-26 NOTE — Progress Notes (Signed)
 HPI: [name] is a 83 y.o. female, who is here today for chronic disease management.  Last seen on *** Discussed the use of AI scribe software for clinical note transcription with the patient, who gave verbal consent to proceed.  History of Present Illness Jennifer Hull is an 83 year old female who presents for a follow-up visit.  She has a history of depression and anxiety, managed with a complex dosing regimen of sertraline : 125 mg on Monday, Wednesday, and Friday, 150 mg on Tuesday, Thursday, and Saturday, and 100 mg on Sunday. She feels stable on this regimen but is cautious about dose reduction due to past experiences with abrupt changes. She also uses temazepam  as needed and melatonin 10 mg for sleep, maintaining a sleep schedule from 10:00 PM to 6:00 AM. She reports that her mood is stable, though she experiences occasional forgetfulness.  She has a history of back pain related to sciatica, for which she was treated in May with gabapentin , prednisone , and an epidural injection. The epidural provided relief after two weeks, and she no longer takes gabapentin , with no recurrence of severe pain since the injection.  She experienced a fall on August 10, 2024, after being startled by an insect, resulting in a bruise and some knee pain. No ongoing issues from the fall are reported.  She has a family history of dementia, with one sister having had dementia and another currently diagnosed with Alzheimer's disease. She is concerned about her cognitive health, noting occasional forgetfulness, such as forgetting words or names.  Recent laboratory tests show normal kidney and liver function, improved cholesterol levels, and a glucose level of 103, down from 116. However, triglycerides increased from 54 to 133, despite efforts to reduce sugar intake.  She has a history of epistaxis requiring cauterization and bruising, which she attributes to her medication use.  *** Review of  Systems See other pertinent positives and negatives in HPI.  Current Outpatient Medications on File Prior to Visit  Medication Sig Dispense Refill   acetaminophen  (TYLENOL ) 500 MG tablet Take 1,000 mg by mouth every 8 (eight) hours as needed for moderate pain.     AMBULATORY NON FORMULARY MEDICATION Medication Name: 90 ml 2% Lidocaine :90 ml Dicyclomine 10 mg/5ml:270 ml Maalox - Take 5-10 ml every 6 hours as needed. 450 mL 5   atorvastatin  (LIPITOR) 10 MG tablet TAKE 1 TABLET(10 MG) BY MOUTH DAILY 90 tablet 2   bisacodyl  (DULCOLAX) 10 MG suppository Every 2 days as needed. 30 suppository 1   Calcium  Carb-Cholecalciferol (CALCIUM  600+D3 PO) Take 1 tablet by mouth daily with lunch.     cetirizine  (ZYRTEC ) 10 MG tablet Take 10 mg by mouth daily.     cholecalciferol (VITAMIN D3) 25 MCG (1000 UNIT) tablet Take 4,000 Units by mouth daily.     Ciclopirox  0.77 % gel Apply 1 Application topically 2 (two) times daily. 45 g 0   estradiol  (ESTRACE ) 0.1 MG/GM vaginal cream Place 0.5 g vaginally 3 (three) times a week. Place 0.5g nightly for two weeks then twice a week after 30 g 2   famotidine  (PEPCID ) 40 MG tablet Take 1 tablet (40 mg total) by mouth daily. 30 tablet 5   folic acid  (FOLVITE ) 800 MCG tablet Take 800 mcg by mouth daily.     gabapentin  (NEURONTIN ) 100 MG capsule Take 1-3 capsules (100-300 mg total) by mouth 3 (three) times daily as needed. 30 capsule 3   levothyroxine  (SYNTHROID ) 112 MCG tablet TAKE 1 TABLET(112 MCG) BY  MOUTH DAILY BEFORE BREAKFAST 90 tablet 2   linaclotide  (LINZESS ) 290 MCG CAPS capsule Take 1 capsule (290 mcg total) by mouth daily before breakfast. 30 capsule 6   Melatonin 10 MG CAPS Take 10 mg by mouth at bedtime.     meloxicam  (MOBIC ) 7.5 MG tablet Take 7.5 mg by mouth daily.     pantoprazole  (PROTONIX ) 40 MG tablet Take 1 tablet (40 mg total) by mouth daily. 90 tablet 3   polyethylene glycol (MIRALAX  / GLYCOLAX ) 17 g packet Take 17 g by mouth daily.     predniSONE   (DELTASONE ) 10 MG tablet Take 3 tablets (30 mg total) by mouth daily with breakfast. 15 tablet 0   Pyridoxine HCl (VITAMIN B-6 PO) Take by mouth.     sertraline  (ZOLOFT ) 100 MG tablet TAKE 1 AND 1/2 TABLETS BY MOUTH EVERY DAY 135 tablet 2   sertraline  (ZOLOFT ) 50 MG tablet TAKE 1 TABLET(50 MG) BY MOUTH DAILY 90 tablet 2   sucralfate  (CARAFATE ) 1 GM/10ML suspension Take 1 g by mouth 2 (two) times daily. As needed.     temazepam  (RESTORIL ) 15 MG capsule TAKE 1 CAPSULE BY MOUTH AT BEDTIME AS NEEDED FOR SLEEP 30 capsule 2   Tocilizumab  (ACTEMRA  IV) Inject into the vein every 30 (thirty) days.     TURMERIC CURCUMIN PO Take 3,000 mg by mouth daily.     zinc gluconate 50 MG tablet Take 50 mg by mouth daily.     Current Facility-Administered Medications on File Prior to Visit  Medication Dose Route Frequency Provider Last Rate Last Admin   denosumab  (PROLIA ) injection 60 mg  60 mg Subcutaneous Once Senya Hinzman G, MD        Past Medical History:  Diagnosis Date   Anxiety    Arthritis    Chronic constipation    Depression    Hyperlipidemia    Hypothyroidism    Pneumonia    Rheumatoid arthritis (HCC) 2019   Temporal arteritis (HCC)    Thyroid  disease    Urethral prolapse    Allergies  Allergen Reactions   Tdap [Tetanus-Diphth-Acell Pertussis] Other (See Comments)    SEVERE ALLERGIC REACTIONS (NECK STIFFNESS/HIGH FEVER/ETC)   Tetanus Toxoid, Adsorbed     Neck stiffness, and high fever Other reaction(s): Other (See Comments) Neck stiffness, and high fever   Hydroxychloroquine Sulfate Rash   Covid-19 (Mrna) Vaccine Proofreader) [Covid-19 (Mrna) Vaccine] Rash    Social History   Socioeconomic History   Marital status: Married    Spouse name: Not on file   Number of children: 1   Years of education: Not on file   Highest education level: Professional school degree (e.g., MD, DDS, DVM, JD)  Occupational History   Occupation: Nurse  Tobacco Use   Smoking status: Former    Current  packs/day: 0.00    Average packs/day: 1.5 packs/day for 8.0 years (12.0 ttl pk-yrs)    Types: Cigarettes    Start date: 75    Quit date: 48    Years since quitting: 47.9   Smokeless tobacco: Never   Tobacco comments:    smoked infrequently when she was young; quit 83 yo   Vaping Use   Vaping status: Never Used  Substance and Sexual Activity   Alcohol use: No   Drug use: No   Sexual activity: Not Currently    Partners: Male    Birth control/protection: Surgical  Other Topics Concern   Not on file  Social History Narrative   Not on  file   Social Drivers of Health   Financial Resource Strain: Low Risk  (06/20/2024)   Overall Financial Resource Strain (CARDIA)    Difficulty of Paying Living Expenses: Not hard at all  Food Insecurity: No Food Insecurity (06/20/2024)   Hunger Vital Sign    Worried About Running Out of Food in the Last Year: Never true    Ran Out of Food in the Last Year: Never true  Transportation Needs: No Transportation Needs (06/20/2024)   PRAPARE - Administrator, Civil Service (Medical): No    Lack of Transportation (Non-Medical): No  Physical Activity: Inactive (06/20/2024)   Exercise Vital Sign    Days of Exercise per Week: 0 days    Minutes of Exercise per Session: 0 min  Stress: No Stress Concern Present (06/20/2024)   [employer] of Occupational Health - Occupational Stress Questionnaire    Feeling of Stress: Not at all  Social Connections: Socially Integrated (06/20/2024)   Social Connection and Isolation Panel    Frequency of Communication with Friends and Family: More than three times a week    Frequency of Social Gatherings with Friends and Family: More than three times a week    Attends Religious Services: More than 4 times per year    Active Member of [employer] or Organizations: Yes    Attends Banker Meetings: More than 4 times per year    Marital Status: Married    Vitals:   08/26/24 1411  BP: 122/72  Pulse:  63  Resp: 16  Temp: 98.7 F (37.1 C)  SpO2: 97%   Body mass index is 22.92 kg/m.  Physical Exam Vitals and nursing note reviewed.  Constitutional:      General: She is not in acute distress.    Appearance: She is well-developed.  HENT:     Head: Normocephalic and atraumatic.  Eyes:     Conjunctiva/sclera: Conjunctivae normal.  Cardiovascular:     Rate and Rhythm: Normal rate and regular rhythm.     Heart sounds: No murmur heard.    Comments: PT pulses present. Pulmonary:     Effort: Pulmonary effort is normal. No respiratory distress.     Breath sounds: Normal breath sounds.  Abdominal:     Palpations: Abdomen is soft. There is no mass.     Tenderness: There is no abdominal tenderness.  Musculoskeletal:     Right lower leg: No edema.     Left lower leg: No edema.  Skin:    General: Skin is warm.     Findings: No erythema or rash.  Neurological:     General: No focal deficit present.     Mental Status: She is alert and oriented to person, place, and time.     Comments: Unstable gait assisted with a cane.  Psychiatric:        Mood and Affect: Mood and affect normal.    ASSESSMENT AND PLAN:  [name] was seen today for follow-up.  Diagnoses and all orders for this visit:  Insomnia, unspecified type Assessment & Plan: Problem is stable. Continue good sleep hygiene and OTC melatonin as well as temazepam  50 mg 1/2 to 1 tablet daily at bedtime as needed.   Generalized anxiety disorder Assessment & Plan: Problem is well-controlled at this time. Recommend decreasing dose of sertraline  from alternating between 150 and 125 to 125 mg daily. Follow-up in 5 to 6 months, before if needed.   Hypothyroidism, unspecified type Assessment & Plan:  Problem has been well-controlled. Last TSH in 02/2024 was 0.5. Continue levothyroxine  112 mcg daily.   Rheumatoid arthritis involving multiple sites, unspecified whether rheumatoid factor present [facility]) Assessment &  Plan: Currently she is on tocilizumab  IV every 30 days. Following with rheumatologist q 6 months. Fall precautions discussed.   Hyperlipidemia, unspecified hyperlipidemia type Assessment & Plan: Problem has been stable, last fasting lipid panel on 08/25/2024. Continue atorvastatin  10 mg daily and low-fat diet. Follow-up in a year.      We had a long discussion about risk factors for Alzheimer's disease, prognosis, diagnostic testing, and more recently***biomarkers.  Return in about 25 weeks (around 02/17/2025) for chronic problems.  Spyridon Hornstein G. Wilder Kurowski, MD  [facility]. [place] office.

## 2024-08-26 NOTE — Assessment & Plan Note (Signed)
 Problem has been well-controlled. Last TSH in 02/2024 was 0.5. Continue levothyroxine  112 mcg daily.

## 2024-08-26 NOTE — Assessment & Plan Note (Signed)
 Problem has been stable, last fasting lipid panel on 08/25/2024. Continue atorvastatin  10 mg daily and low-fat diet. Follow-up in a year.

## 2024-08-26 NOTE — Patient Instructions (Addendum)
 A few things to remember from today's visit:  Insomnia, unspecified type  Generalized anxiety disorder  Hypothyroidism, unspecified type  Disminuya Sertraline  a 125 mg diario. El resto no cambios.  If you need refills for medications you take chronically, please call your pharmacy. Do not use My Chart to request refills or for acute issues that need immediate attention. If you send a my chart message, it may take a few days to be addressed, specially if I am not in the office.  Please be sure medication list is accurate. If a new problem present, please set up appointment sooner than planned today.

## 2024-08-26 NOTE — Assessment & Plan Note (Signed)
 Problem is well-controlled at this time. Recommend decreasing dose of sertraline  from alternating between 150 and 125 to 125 mg daily. Follow-up in 5 to 6 months, before if needed.

## 2024-08-26 NOTE — Assessment & Plan Note (Signed)
 Problem is stable. Continue good sleep hygiene and OTC melatonin as well as temazepam  50 mg 1/2 to 1 tablet daily at bedtime as needed.

## 2024-08-28 MED ORDER — SERTRALINE HCL 50 MG PO TABS: 25.0000 mg | ORAL_TABLET | Freq: Every day | ORAL | Status: AC

## 2024-09-08 DIAGNOSIS — R5383 Other fatigue: Secondary | ICD-10-CM | POA: Diagnosis not present

## 2024-09-08 DIAGNOSIS — M1991 Primary osteoarthritis, unspecified site: Secondary | ICD-10-CM | POA: Diagnosis not present

## 2024-09-08 DIAGNOSIS — M8589 Other specified disorders of bone density and structure, multiple sites: Secondary | ICD-10-CM | POA: Diagnosis not present

## 2024-09-08 DIAGNOSIS — R7989 Other specified abnormal findings of blood chemistry: Secondary | ICD-10-CM | POA: Diagnosis not present

## 2024-09-08 DIAGNOSIS — G5603 Carpal tunnel syndrome, bilateral upper limbs: Secondary | ICD-10-CM | POA: Diagnosis not present

## 2024-09-08 DIAGNOSIS — M0589 Other rheumatoid arthritis with rheumatoid factor of multiple sites: Secondary | ICD-10-CM | POA: Diagnosis not present

## 2024-09-08 DIAGNOSIS — M316 Other giant cell arteritis: Secondary | ICD-10-CM | POA: Diagnosis not present

## 2024-09-08 DIAGNOSIS — M5136 Other intervertebral disc degeneration, lumbar region with discogenic back pain only: Secondary | ICD-10-CM | POA: Diagnosis not present

## 2024-09-08 DIAGNOSIS — Z79899 Other long term (current) drug therapy: Secondary | ICD-10-CM | POA: Diagnosis not present

## 2024-09-08 DIAGNOSIS — Z6821 Body mass index (BMI) 21.0-21.9, adult: Secondary | ICD-10-CM | POA: Diagnosis not present

## 2024-09-14 ENCOUNTER — Other Ambulatory Visit: Payer: Self-pay | Admitting: Family Medicine

## 2024-09-14 DIAGNOSIS — E039 Hypothyroidism, unspecified: Secondary | ICD-10-CM

## 2024-09-16 ENCOUNTER — Ambulatory Visit (HOSPITAL_COMMUNITY)
Admission: RE | Admit: 2024-09-16 | Discharge: 2024-09-16 | Disposition: A | Source: Ambulatory Visit | Attending: Cardiovascular Disease

## 2024-09-16 DIAGNOSIS — I351 Nonrheumatic aortic (valve) insufficiency: Secondary | ICD-10-CM | POA: Diagnosis not present

## 2024-09-17 ENCOUNTER — Ambulatory Visit: Payer: Self-pay | Admitting: Cardiovascular Disease

## 2024-09-17 DIAGNOSIS — G5602 Carpal tunnel syndrome, left upper limb: Secondary | ICD-10-CM | POA: Diagnosis not present

## 2024-09-17 DIAGNOSIS — Z4789 Encounter for other orthopedic aftercare: Secondary | ICD-10-CM | POA: Diagnosis not present

## 2024-09-17 LAB — ECHOCARDIOGRAM COMPLETE
AR max vel: 3.09 cm2
AV Area VTI: 2.96 cm2
AV Area mean vel: 2.9 cm2
AV Mean grad: 5 mmHg
AV Peak grad: 9.6 mmHg
Ao pk vel: 1.55 m/s
Area-P 1/2: 4.25 cm2
P 1/2 time: 507 ms
S' Lateral: 2.2 cm

## 2024-09-22 DIAGNOSIS — M0579 Rheumatoid arthritis with rheumatoid factor of multiple sites without organ or systems involvement: Secondary | ICD-10-CM | POA: Diagnosis not present

## 2024-10-16 ENCOUNTER — Other Ambulatory Visit (HOSPITAL_COMMUNITY): Payer: Self-pay

## 2024-10-16 NOTE — Telephone Encounter (Signed)
Please assist if able, thank you!

## 2024-10-16 NOTE — Telephone Encounter (Signed)
 Please submit for [employer]  patient assistance.

## 2024-10-20 ENCOUNTER — Encounter: Payer: Self-pay | Admitting: *Deleted

## 2024-10-20 NOTE — Telephone Encounter (Signed)
 Patient assistance formed faxed to [employer].

## 2024-10-24 ENCOUNTER — Ambulatory Visit: Admitting: Emergency Medicine

## 2024-11-14 ENCOUNTER — Ambulatory Visit: Admitting: Family Medicine

## 2024-11-14 ENCOUNTER — Ambulatory Visit

## 2024-11-14 ENCOUNTER — Encounter: Payer: Self-pay | Admitting: Family Medicine

## 2024-11-14 VITALS — BP 138/50 | HR 67 | Temp 98.3°F | Ht 63.0 in | Wt 130.9 lb

## 2024-11-14 DIAGNOSIS — W19XXXA Unspecified fall, initial encounter: Secondary | ICD-10-CM

## 2024-11-14 DIAGNOSIS — M25551 Pain in right hip: Secondary | ICD-10-CM

## 2024-11-14 DIAGNOSIS — M5416 Radiculopathy, lumbar region: Secondary | ICD-10-CM

## 2024-11-14 MED ORDER — BACLOFEN 5 MG PO TABS: 1.0000 | ORAL_TABLET | Freq: Two times a day (BID) | ORAL | 0 refills | Status: AC | PRN

## 2024-11-14 NOTE — Progress Notes (Signed)
 "  Acute Office Visit  Subjective:     Patient ID: Jennifer Hull, female    DOB: [birth year], 84 y.o.   MRN: [id number]  Chief Complaint  Patient presents with   Fall    Patient complains of low back, left lateral hip/buttock pain since she fell after slipping onto liquid in the floor, tried Tylenol  with little relief    Fall   Discussed the use of AI scribe software for clinical note transcription with the patient, who gave verbal consent to proceed.  History of Present Illness   Jennifer Hull is an 84 year old female with arthritis and low bone density who presents with pain following a fall.  She fell on January 28 after slipping on a wet floor, landing on her left side on her shoulder and hip. She notes a previous fall on the same side that made the current pain worse. She was alone and was able to stand up on her own after the fall.  Her pain now radiates from her back down to her legs and into her groin. It is severe with movement or getting up, though slightly improved since the fall. She has no pain when lying flat and no morning stiffness.  She is on Prolia  for low bone density and had lumbar spine x-rays last summer. For pain she occasionally takes meloxicam  7.5 mg, which helps, but she prefers to avoid frequent NSAID use due to concern about memory loss. She also uses cold therapy for muscle spasms.         Review of Systems  All other systems reviewed and are negative.       Objective:    BP (!) 138/50   Pulse 67   Temp 98.3 F (36.8 C) (Oral)   Ht 5' 3 (1.6 m)   Wt 130 lb 14.4 oz (59.4 kg)   SpO2 98%   BMI 23.19 kg/m    Physical Exam Vitals reviewed.  Constitutional:      Appearance: Normal appearance. She is normal weight.  Pulmonary:     Effort: Pulmonary effort is normal.  Neurological:     Mental Status: She is alert.     No results found for any visits on 11/14/24.      Assessment & Plan:   Problem List Items Addressed This Visit    None Visit Diagnoses       Fall, initial encounter    -  Primary   Relevant Orders   DG HIPS BILAT WITH PELVIS 3-4 VIEWS   DG Lumbar Spine Complete     Bilateral hip pain       Relevant Medications   Baclofen  5 MG TABS   Other Relevant Orders   DG HIPS BILAT WITH PELVIS 3-4 VIEWS   DG Lumbar Spine Complete     Lumbar radiculopathy       Relevant Medications   Baclofen  5 MG TABS   Other Relevant Orders   DG HIPS BILAT WITH PELVIS 3-4 VIEWS   DG Lumbar Spine Complete     Assessment and Plan    Fall with bilateral hip and lumbar pain Recent fall on February 28th, 2026, resulting in bilateral hip and lumbar pain. Pain radiates from the back to the face and groins, exacerbated by movement. Previous x-rays showed degenerative changes and low bone density. Differential includes possible fracture or compression fracture in the spine. Pain has improved slightly since the fall. Meloxicam  provides some relief, but she is cautious about  long-term use due to potential side effects. Tylenol  is suggested as a safer alternative for pain management. - Ordered x-rays of hips, pelvis, and lumbar spine to rule out fractures. - Continue meloxicam  for pain management, with caution regarding long-term use. - Switch to Tylenol  for pain management as a safer alternative. - Use heat therapy and muscle rubs for muscle spasms. - Prescribed a low-dose muscle relaxer (5 mg) to be taken a couple of times a day as needed for muscle stiffness and pain.  Osteoporosis Low bone density noted on previous x-rays. Currently on Prolia , which may help prevent fractures. - Continue Prolia  for osteoporosis management.        Meds ordered this encounter  Medications   Baclofen  5 MG TABS    Sig: Take 1 tablet (5 mg total) by mouth 2 (two) times daily as needed.    Dispense:  30 tablet    Refill:  0    No follow-ups on file.  Heron CHRISTELLA Sharper, MD   "

## 2024-12-17 ENCOUNTER — Ambulatory Visit: Admitting: Emergency Medicine

## 2025-02-17 ENCOUNTER — Ambulatory Visit: Admitting: Family Medicine

## 2025-06-26 ENCOUNTER — Ambulatory Visit
